# Patient Record
Sex: Female | Born: 1975 | Race: White | Hispanic: No | Marital: Single | Smoking: Never smoker
Health system: Southern US, Community
[De-identification: ages and names within clinical notes are randomized; demographics above are authoritative.]

## PROBLEM LIST (undated history)

## (undated) DIAGNOSIS — S72009A Fracture of unspecified part of neck of unspecified femur, initial encounter for closed fracture: Secondary | ICD-10-CM

## (undated) DIAGNOSIS — Z9289 Personal history of other medical treatment: Secondary | ICD-10-CM

## (undated) DIAGNOSIS — IMO0002 Reserved for concepts with insufficient information to code with codable children: Secondary | ICD-10-CM

## (undated) DIAGNOSIS — K219 Gastro-esophageal reflux disease without esophagitis: Secondary | ICD-10-CM

## (undated) DIAGNOSIS — D649 Anemia, unspecified: Secondary | ICD-10-CM

## (undated) DIAGNOSIS — R4586 Emotional lability: Secondary | ICD-10-CM

## (undated) DIAGNOSIS — E785 Hyperlipidemia, unspecified: Secondary | ICD-10-CM

## (undated) DIAGNOSIS — I10 Essential (primary) hypertension: Secondary | ICD-10-CM

## (undated) DIAGNOSIS — E039 Hypothyroidism, unspecified: Secondary | ICD-10-CM

## (undated) DIAGNOSIS — M8430XA Stress fracture, unspecified site, initial encounter for fracture: Secondary | ICD-10-CM

## (undated) DIAGNOSIS — G43009 Migraine without aura, not intractable, without status migrainosus: Secondary | ICD-10-CM

## (undated) DIAGNOSIS — M8580 Other specified disorders of bone density and structure, unspecified site: Secondary | ICD-10-CM

## (undated) DIAGNOSIS — K900 Celiac disease: Secondary | ICD-10-CM

## (undated) DIAGNOSIS — J45909 Unspecified asthma, uncomplicated: Secondary | ICD-10-CM

## (undated) DIAGNOSIS — Z973 Presence of spectacles and contact lenses: Secondary | ICD-10-CM

## (undated) DIAGNOSIS — T7840XA Allergy, unspecified, initial encounter: Secondary | ICD-10-CM

## (undated) HISTORY — DX: Emotional lability: R45.86

## (undated) HISTORY — DX: Personal history of other medical treatment: Z92.89

## (undated) HISTORY — PX: COSMETIC SURGERY: SHX468

## (undated) HISTORY — PX: POLYPECTOMY: SHX149

## (undated) HISTORY — DX: Other specified disorders of bone density and structure, unspecified site: M85.80

## (undated) HISTORY — PX: INGUINAL HERNIA REPAIR: SUR1180

## (undated) HISTORY — DX: Allergy, unspecified, initial encounter: T78.40XA

## (undated) HISTORY — DX: Presence of spectacles and contact lenses: Z97.3

## (undated) HISTORY — DX: Hyperlipidemia, unspecified: E78.5

## (undated) HISTORY — PX: SINOSCOPY: SHX187

## (undated) HISTORY — PX: ESOPHAGOGASTRODUODENOSCOPY: SHX1529

## (undated) HISTORY — DX: Reserved for concepts with insufficient information to code with codable children: IMO0002

## (undated) HISTORY — DX: Migraine without aura, not intractable, without status migrainosus: G43.009

## (undated) HISTORY — DX: Stress fracture, unspecified site, initial encounter for fracture: M84.30XA

## (undated) HISTORY — DX: Essential (primary) hypertension: I10

## (undated) HISTORY — DX: Unspecified asthma, uncomplicated: J45.909

## (undated) HISTORY — DX: Anemia, unspecified: D64.9

## (undated) HISTORY — PX: WISDOM TOOTH EXTRACTION: SHX21

## (undated) HISTORY — DX: Gastro-esophageal reflux disease without esophagitis: K21.9

## (undated) HISTORY — PX: NASAL SEPTUM SURGERY: SHX37

## (undated) HISTORY — DX: Celiac disease: K90.0

## (undated) HISTORY — DX: Hypothyroidism, unspecified: E03.9

## (undated) HISTORY — DX: Fracture of unspecified part of neck of unspecified femur, initial encounter for closed fracture: S72.009A

---

## 2003-11-25 ENCOUNTER — Encounter: Admission: RE | Admit: 2003-11-25 | Discharge: 2003-11-25 | Payer: Self-pay | Admitting: Endocrinology

## 2004-04-09 ENCOUNTER — Other Ambulatory Visit: Admission: RE | Admit: 2004-04-09 | Discharge: 2004-04-09 | Payer: Self-pay | Admitting: Obstetrics and Gynecology

## 2004-04-23 ENCOUNTER — Encounter: Admission: RE | Admit: 2004-04-23 | Discharge: 2004-04-23 | Payer: Self-pay | Admitting: Sports Medicine

## 2004-05-04 ENCOUNTER — Encounter: Admission: RE | Admit: 2004-05-04 | Discharge: 2004-05-04 | Payer: Self-pay | Admitting: Sports Medicine

## 2004-05-11 ENCOUNTER — Encounter: Admission: RE | Admit: 2004-05-11 | Discharge: 2004-05-11 | Payer: Self-pay | Admitting: Family Medicine

## 2004-06-01 ENCOUNTER — Encounter: Admission: RE | Admit: 2004-06-01 | Discharge: 2004-06-01 | Payer: Self-pay | Admitting: Family Medicine

## 2004-09-02 ENCOUNTER — Ambulatory Visit: Payer: Self-pay | Admitting: Hematology & Oncology

## 2004-09-12 ENCOUNTER — Emergency Department (HOSPITAL_COMMUNITY): Admission: EM | Admit: 2004-09-12 | Discharge: 2004-09-12 | Payer: Self-pay | Admitting: Family Medicine

## 2004-09-30 ENCOUNTER — Ambulatory Visit: Payer: Self-pay | Admitting: Sports Medicine

## 2004-11-02 ENCOUNTER — Ambulatory Visit: Payer: Self-pay | Admitting: Hematology & Oncology

## 2004-11-02 ENCOUNTER — Ambulatory Visit: Payer: Self-pay | Admitting: Sports Medicine

## 2005-01-04 ENCOUNTER — Ambulatory Visit: Payer: Self-pay | Admitting: Sports Medicine

## 2005-01-05 ENCOUNTER — Ambulatory Visit: Payer: Self-pay | Admitting: Hematology & Oncology

## 2005-03-04 ENCOUNTER — Ambulatory Visit: Payer: Self-pay | Admitting: Hematology & Oncology

## 2005-03-24 ENCOUNTER — Other Ambulatory Visit: Admission: RE | Admit: 2005-03-24 | Discharge: 2005-03-24 | Payer: Self-pay | Admitting: Obstetrics and Gynecology

## 2005-04-13 ENCOUNTER — Other Ambulatory Visit: Admission: RE | Admit: 2005-04-13 | Discharge: 2005-04-13 | Payer: Self-pay | Admitting: Obstetrics and Gynecology

## 2005-04-19 ENCOUNTER — Ambulatory Visit: Payer: Self-pay | Admitting: Hematology & Oncology

## 2005-06-07 ENCOUNTER — Ambulatory Visit: Payer: Self-pay | Admitting: Hematology & Oncology

## 2005-06-24 ENCOUNTER — Encounter: Admission: RE | Admit: 2005-06-24 | Discharge: 2005-06-24 | Payer: Self-pay | Admitting: Sports Medicine

## 2005-06-24 ENCOUNTER — Ambulatory Visit: Payer: Self-pay | Admitting: Sports Medicine

## 2005-07-29 ENCOUNTER — Ambulatory Visit: Payer: Self-pay | Admitting: Hematology & Oncology

## 2005-08-19 ENCOUNTER — Ambulatory Visit: Payer: Self-pay | Admitting: Sports Medicine

## 2005-09-30 ENCOUNTER — Ambulatory Visit: Payer: Self-pay | Admitting: Hematology & Oncology

## 2005-11-04 ENCOUNTER — Ambulatory Visit: Payer: Self-pay | Admitting: Sports Medicine

## 2005-11-12 ENCOUNTER — Emergency Department (HOSPITAL_COMMUNITY): Admission: EM | Admit: 2005-11-12 | Discharge: 2005-11-12 | Payer: Self-pay | Admitting: Family Medicine

## 2005-11-13 ENCOUNTER — Emergency Department (HOSPITAL_COMMUNITY): Admission: EM | Admit: 2005-11-13 | Discharge: 2005-11-13 | Payer: Self-pay | Admitting: Family Medicine

## 2005-11-14 ENCOUNTER — Ambulatory Visit: Payer: Self-pay | Admitting: Sports Medicine

## 2005-11-17 ENCOUNTER — Ambulatory Visit: Payer: Self-pay | Admitting: Sports Medicine

## 2005-11-18 ENCOUNTER — Ambulatory Visit: Payer: Self-pay | Admitting: Hematology & Oncology

## 2005-11-18 ENCOUNTER — Ambulatory Visit: Payer: Self-pay | Admitting: Sports Medicine

## 2005-11-19 ENCOUNTER — Ambulatory Visit (HOSPITAL_COMMUNITY): Admission: RE | Admit: 2005-11-19 | Discharge: 2005-11-19 | Payer: Self-pay | Admitting: Sports Medicine

## 2005-11-22 ENCOUNTER — Ambulatory Visit: Payer: Self-pay | Admitting: Sports Medicine

## 2005-12-13 ENCOUNTER — Ambulatory Visit: Payer: Self-pay | Admitting: Sports Medicine

## 2006-01-03 ENCOUNTER — Ambulatory Visit: Payer: Self-pay | Admitting: Hematology & Oncology

## 2006-02-06 LAB — CBC WITH DIFFERENTIAL/PLATELET
EOS%: 7.8 % — ABNORMAL HIGH (ref 0.0–7.0)
Eosinophils Absolute: 0.4 10*3/uL (ref 0.0–0.5)
MCV: 94.5 fL (ref 81.0–101.0)
MONO%: 6.6 % (ref 0.0–13.0)
NEUT#: 3.6 10*3/uL (ref 1.5–6.5)
RBC: 4.75 10*6/uL (ref 3.70–5.32)
RDW: 12.9 % (ref 11.3–14.5)

## 2006-02-07 LAB — VITAMIN B12: Vitamin B-12: 504 pg/mL (ref 211–911)

## 2006-02-07 LAB — FERRITIN: Ferritin: 459 ng/mL — ABNORMAL HIGH (ref 10–291)

## 2006-03-02 ENCOUNTER — Ambulatory Visit: Payer: Self-pay | Admitting: Hematology & Oncology

## 2006-03-06 LAB — CBC WITH DIFFERENTIAL/PLATELET
Basophils Absolute: 0 10*3/uL (ref 0.0–0.1)
Eosinophils Absolute: 0.9 10*3/uL — ABNORMAL HIGH (ref 0.0–0.5)
HGB: 15.1 g/dL (ref 11.6–15.9)
LYMPH%: 23.8 % (ref 14.0–48.0)
MCV: 94.7 fL (ref 81.0–101.0)
MONO%: 7.1 % (ref 0.0–13.0)
NEUT#: 3.1 10*3/uL (ref 1.5–6.5)
Platelets: 248 10*3/uL (ref 145–400)
RBC: 4.63 10*6/uL (ref 3.70–5.32)

## 2006-04-04 LAB — CBC WITH DIFFERENTIAL/PLATELET
Basophils Absolute: 0 10*3/uL (ref 0.0–0.1)
EOS%: 14.7 % — ABNORMAL HIGH (ref 0.0–7.0)
HCT: 40.5 % (ref 34.8–46.6)
HGB: 13.7 g/dL (ref 11.6–15.9)
LYMPH%: 29.4 % (ref 14.0–48.0)
MCH: 32.2 pg (ref 26.0–34.0)
MCV: 95.2 fL (ref 81.0–101.0)
MONO%: 6 % (ref 0.0–13.0)
NEUT%: 49.2 % (ref 39.6–76.8)
Platelets: 274 10*3/uL (ref 145–400)

## 2006-04-05 LAB — IRON AND TIBC: UIBC: 129 ug/dL

## 2006-04-28 ENCOUNTER — Ambulatory Visit: Payer: Self-pay | Admitting: Hematology & Oncology

## 2006-05-10 LAB — CBC WITH DIFFERENTIAL/PLATELET
BASO%: 0.6 % (ref 0.0–2.0)
Basophils Absolute: 0 10*3/uL (ref 0.0–0.1)
EOS%: 6.2 % (ref 0.0–7.0)
HCT: 45.3 % (ref 34.8–46.6)
HGB: 15.4 g/dL (ref 11.6–15.9)
MONO#: 0.2 10*3/uL (ref 0.1–0.9)
MONO%: 4.5 % (ref 0.0–13.0)
Platelets: 272 10*3/uL (ref 145–400)
WBC: 4.4 10*3/uL (ref 3.9–10.0)
lymph#: 1.4 10*3/uL (ref 0.9–3.3)

## 2006-05-11 LAB — IRON AND TIBC: Iron: 105 ug/dL (ref 42–145)

## 2006-05-11 LAB — FERRITIN: Ferritin: 227 ng/mL (ref 10–291)

## 2006-05-11 LAB — FOLATE RBC: RBC Folate: 556 ng/mL (ref 180–600)

## 2006-05-22 ENCOUNTER — Other Ambulatory Visit: Admission: RE | Admit: 2006-05-22 | Discharge: 2006-05-22 | Payer: Self-pay | Admitting: Obstetrics and Gynecology

## 2006-05-24 ENCOUNTER — Ambulatory Visit (HOSPITAL_COMMUNITY): Admission: RE | Admit: 2006-05-24 | Discharge: 2006-05-24 | Payer: Self-pay | Admitting: Hematology & Oncology

## 2006-06-12 LAB — CBC WITH DIFFERENTIAL/PLATELET
Basophils Absolute: 0 10*3/uL (ref 0.0–0.1)
HCT: 43.5 % (ref 34.8–46.6)
HGB: 14.9 g/dL (ref 11.6–15.9)
MONO#: 0.3 10*3/uL (ref 0.1–0.9)
NEUT#: 3 10*3/uL (ref 1.5–6.5)
NEUT%: 64.2 % (ref 39.6–76.8)
WBC: 4.7 10*3/uL (ref 3.9–10.0)
lymph#: 1 10*3/uL (ref 0.9–3.3)

## 2006-06-13 LAB — IRON AND TIBC
%SAT: 15 % — ABNORMAL LOW (ref 20–55)
TIBC: 369 ug/dL (ref 250–470)

## 2006-06-13 LAB — VITAMIN B12: Vitamin B-12: 457 pg/mL (ref 211–911)

## 2006-06-13 LAB — FERRITIN: Ferritin: 179 ng/mL (ref 10–291)

## 2006-06-13 LAB — FOLATE RBC: RBC Folate: 618 ng/mL — ABNORMAL HIGH (ref 180–600)

## 2006-06-15 ENCOUNTER — Ambulatory Visit: Payer: Self-pay | Admitting: Hematology & Oncology

## 2006-07-11 LAB — CBC WITH DIFFERENTIAL/PLATELET
BASO%: 0.3 % (ref 0.0–2.0)
EOS%: 5.9 % (ref 0.0–7.0)
HCT: 45.2 % (ref 34.8–46.6)
MCH: 31.8 pg (ref 26.0–34.0)
MCHC: 34.4 g/dL (ref 32.0–36.0)
NEUT%: 67.3 % (ref 39.6–76.8)
RBC: 4.87 10*6/uL (ref 3.70–5.32)
WBC: 6 10*3/uL (ref 3.9–10.0)
lymph#: 1.3 10*3/uL (ref 0.9–3.3)

## 2006-07-12 LAB — FOLATE RBC: RBC Folate: 460 ng/mL (ref 180–600)

## 2006-07-12 LAB — FERRITIN: Ferritin: 136 ng/mL (ref 10–291)

## 2006-07-12 LAB — IRON AND TIBC: Iron: 95 ug/dL (ref 42–145)

## 2006-07-18 ENCOUNTER — Ambulatory Visit (HOSPITAL_COMMUNITY): Admission: RE | Admit: 2006-07-18 | Discharge: 2006-07-18 | Payer: Self-pay | Admitting: Hematology & Oncology

## 2006-08-04 ENCOUNTER — Ambulatory Visit: Payer: Self-pay | Admitting: Hematology & Oncology

## 2006-08-10 LAB — CBC WITH DIFFERENTIAL/PLATELET
BASO%: 0.5 % (ref 0.0–2.0)
EOS%: 7 % (ref 0.0–7.0)
HCT: 42.5 % (ref 34.8–46.6)
MCH: 32.2 pg (ref 26.0–34.0)
MCHC: 34.5 g/dL (ref 32.0–36.0)
MONO#: 0.2 10*3/uL (ref 0.1–0.9)
RBC: 4.56 10*6/uL (ref 3.70–5.32)
RDW: 12.5 % (ref 11.3–14.5)
WBC: 4.3 10*3/uL (ref 3.9–10.0)
lymph#: 0.8 10*3/uL — ABNORMAL LOW (ref 0.9–3.3)

## 2006-08-11 LAB — IRON AND TIBC
%SAT: 43 % (ref 20–55)
Iron: 143 ug/dL (ref 42–145)
TIBC: 335 ug/dL (ref 250–470)
UIBC: 192 ug/dL

## 2006-08-11 LAB — FOLATE RBC: RBC Folate: 489 ng/mL (ref 180–600)

## 2006-09-07 LAB — CBC WITH DIFFERENTIAL/PLATELET
BASO%: 0.5 % (ref 0.0–2.0)
Eosinophils Absolute: 0.4 10*3/uL (ref 0.0–0.5)
HCT: 44.2 % (ref 34.8–46.6)
HGB: 14.9 g/dL (ref 11.6–15.9)
LYMPH%: 26.4 % (ref 14.0–48.0)
MONO#: 0.4 10*3/uL (ref 0.1–0.9)
NEUT#: 3.3 10*3/uL (ref 1.5–6.5)
NEUT%: 59.1 % (ref 39.6–76.8)
Platelets: 296 10*3/uL (ref 145–400)
WBC: 5.6 10*3/uL (ref 3.9–10.0)
lymph#: 1.5 10*3/uL (ref 0.9–3.3)

## 2006-09-08 LAB — IRON AND TIBC
%SAT: 17 % — ABNORMAL LOW (ref 20–55)
TIBC: 330 ug/dL (ref 250–470)

## 2006-09-08 LAB — FOLATE RBC: RBC Folate: 618 ng/mL — ABNORMAL HIGH (ref 180–600)

## 2006-09-08 LAB — VITAMIN B12: Vitamin B-12: 549 pg/mL (ref 211–911)

## 2006-09-20 ENCOUNTER — Ambulatory Visit: Payer: Self-pay | Admitting: Hematology & Oncology

## 2006-10-10 LAB — CBC WITH DIFFERENTIAL/PLATELET
BASO%: 0.3 % (ref 0.0–2.0)
EOS%: 11.6 % — ABNORMAL HIGH (ref 0.0–7.0)
Eosinophils Absolute: 0.6 10*3/uL — ABNORMAL HIGH (ref 0.0–0.5)
LYMPH%: 29 % (ref 14.0–48.0)
MCHC: 33.8 g/dL (ref 32.0–36.0)
MCV: 93.4 fL (ref 81.0–101.0)
MONO%: 8.1 % (ref 0.0–13.0)
NEUT#: 2.7 10*3/uL (ref 1.5–6.5)
Platelets: 255 10*3/uL (ref 145–400)
RBC: 4.9 10*6/uL (ref 3.70–5.32)
RDW: 12 % (ref 11.3–14.5)
WBC: 5.3 10*3/uL (ref 3.9–10.0)

## 2006-10-11 LAB — IRON AND TIBC: TIBC: 277 ug/dL (ref 250–470)

## 2006-10-11 LAB — FERRITIN: Ferritin: 541 ng/mL — ABNORMAL HIGH (ref 10–291)

## 2006-10-11 LAB — VITAMIN B12: Vitamin B-12: 704 pg/mL (ref 211–911)

## 2006-10-31 DIAGNOSIS — S72009A Fracture of unspecified part of neck of unspecified femur, initial encounter for closed fracture: Secondary | ICD-10-CM

## 2006-10-31 HISTORY — DX: Fracture of unspecified part of neck of unspecified femur, initial encounter for closed fracture: S72.009A

## 2006-11-09 ENCOUNTER — Ambulatory Visit: Payer: Self-pay | Admitting: Hematology & Oncology

## 2006-11-14 LAB — CBC & DIFF AND RETIC
BASO%: 0.3 % (ref 0.0–2.0)
LYMPH%: 25.4 % (ref 14.0–48.0)
MCH: 30.6 pg (ref 26.0–34.0)
MCHC: 32.5 g/dL (ref 32.0–36.0)
MCV: 93.9 fL (ref 81.0–101.0)
MONO%: 6.5 % (ref 0.0–13.0)
Platelets: 273 10*3/uL (ref 145–400)
RBC: 4.9 10*6/uL (ref 3.70–5.32)
Retic %: 1 % (ref 0.4–2.3)
WBC: 4.5 10*3/uL (ref 3.9–10.0)

## 2006-11-15 LAB — VITAMIN B12: Vitamin B-12: 509 pg/mL (ref 211–911)

## 2006-11-15 LAB — FERRITIN: Ferritin: 296 ng/mL — ABNORMAL HIGH (ref 10–291)

## 2006-12-18 ENCOUNTER — Ambulatory Visit: Payer: Self-pay | Admitting: Hematology & Oncology

## 2006-12-21 LAB — CBC WITH DIFFERENTIAL/PLATELET
BASO%: 0.3 % (ref 0.0–2.0)
EOS%: 4.7 % (ref 0.0–7.0)
LYMPH%: 16.9 % (ref 14.0–48.0)
MCHC: 34.3 g/dL (ref 32.0–36.0)
MCV: 92.8 fL (ref 81.0–101.0)
MONO%: 4.7 % (ref 0.0–13.0)
Platelets: 229 10*3/uL (ref 145–400)
RBC: 4.82 10*6/uL (ref 3.70–5.32)
RDW: 13.5 % (ref 11.3–14.5)

## 2006-12-21 LAB — CHCC SMEAR

## 2006-12-22 LAB — FERRITIN: Ferritin: 206 ng/mL (ref 10–291)

## 2006-12-22 LAB — VITAMIN B12: Vitamin B-12: 440 pg/mL (ref 211–911)

## 2007-01-18 LAB — CHCC SMEAR

## 2007-01-18 LAB — CBC WITH DIFFERENTIAL/PLATELET
BASO%: 1 % (ref 0.0–2.0)
MCHC: 35.4 g/dL (ref 32.0–36.0)
MONO#: 0.3 10*3/uL (ref 0.1–0.9)
RBC: 4.55 10*6/uL (ref 3.70–5.32)
WBC: 5.9 10*3/uL (ref 3.9–10.0)
lymph#: 0.9 10*3/uL (ref 0.9–3.3)

## 2007-01-22 LAB — VITAMIN B12: Vitamin B-12: 469 pg/mL (ref 211–911)

## 2007-02-04 ENCOUNTER — Emergency Department (HOSPITAL_COMMUNITY): Admission: EM | Admit: 2007-02-04 | Discharge: 2007-02-04 | Payer: Self-pay | Admitting: Emergency Medicine

## 2007-02-05 ENCOUNTER — Telehealth (INDEPENDENT_AMBULATORY_CARE_PROVIDER_SITE_OTHER): Payer: Self-pay | Admitting: *Deleted

## 2007-02-09 ENCOUNTER — Ambulatory Visit: Payer: Self-pay | Admitting: Sports Medicine

## 2007-02-09 DIAGNOSIS — R109 Unspecified abdominal pain: Secondary | ICD-10-CM | POA: Insufficient documentation

## 2007-02-09 DIAGNOSIS — M25559 Pain in unspecified hip: Secondary | ICD-10-CM | POA: Insufficient documentation

## 2007-02-13 ENCOUNTER — Encounter: Admission: RE | Admit: 2007-02-13 | Discharge: 2007-02-13 | Payer: Self-pay | Admitting: Sports Medicine

## 2007-02-14 ENCOUNTER — Telehealth: Payer: Self-pay | Admitting: Sports Medicine

## 2007-02-19 ENCOUNTER — Ambulatory Visit: Payer: Self-pay | Admitting: Hematology & Oncology

## 2007-02-22 LAB — CBC WITH DIFFERENTIAL/PLATELET
EOS%: 4.6 % (ref 0.0–7.0)
MCH: 32.3 pg (ref 26.0–34.0)
MCV: 90.8 fL (ref 81.0–101.0)
MONO%: 8.4 % (ref 0.0–13.0)
RBC: 4.49 10*6/uL (ref 3.70–5.32)
RDW: 12.2 % (ref 11.3–14.5)

## 2007-02-22 LAB — CHCC SMEAR

## 2007-02-23 LAB — FOLATE RBC: RBC Folate: 607 ng/mL — ABNORMAL HIGH (ref 180–600)

## 2007-02-23 LAB — FERRITIN: Ferritin: 144 ng/mL (ref 10–291)

## 2007-04-13 ENCOUNTER — Ambulatory Visit: Payer: Self-pay | Admitting: Hematology & Oncology

## 2007-04-18 LAB — CBC WITH DIFFERENTIAL/PLATELET
EOS%: 7.5 % — ABNORMAL HIGH (ref 0.0–7.0)
MCH: 31.7 pg (ref 26.0–34.0)
MCHC: 35.4 g/dL (ref 32.0–36.0)
MCV: 89.8 fL (ref 81.0–101.0)
MONO%: 5.3 % (ref 0.0–13.0)
RBC: 4.89 10*6/uL (ref 3.70–5.32)
RDW: 12.2 % (ref 11.3–14.5)

## 2007-04-18 LAB — CHCC SMEAR

## 2007-04-19 LAB — VITAMIN B12: Vitamin B-12: 526 pg/mL (ref 211–911)

## 2007-04-19 LAB — FERRITIN: Ferritin: 149 ng/mL (ref 10–291)

## 2007-05-08 ENCOUNTER — Ambulatory Visit: Payer: Self-pay | Admitting: Sports Medicine

## 2007-05-08 DIAGNOSIS — S72009A Fracture of unspecified part of neck of unspecified femur, initial encounter for closed fracture: Secondary | ICD-10-CM | POA: Insufficient documentation

## 2007-05-09 ENCOUNTER — Encounter: Payer: Self-pay | Admitting: Sports Medicine

## 2007-05-11 ENCOUNTER — Encounter: Admission: RE | Admit: 2007-05-11 | Discharge: 2007-05-11 | Payer: Self-pay | Admitting: Sports Medicine

## 2007-05-15 LAB — CBC WITH DIFFERENTIAL/PLATELET
BASO%: 0.3 % (ref 0.0–2.0)
LYMPH%: 25.9 % (ref 14.0–48.0)
MCHC: 35.4 g/dL (ref 32.0–36.0)
MCV: 90 fL (ref 81.0–101.0)
MONO%: 3.9 % (ref 0.0–13.0)
Platelets: 250 10*3/uL (ref 145–400)
RBC: 4.87 10*6/uL (ref 3.70–5.32)

## 2007-05-15 LAB — CHCC SMEAR

## 2007-05-16 LAB — VITAMIN B12: Vitamin B-12: 451 pg/mL (ref 211–911)

## 2007-06-12 ENCOUNTER — Encounter: Payer: Self-pay | Admitting: Sports Medicine

## 2007-06-13 ENCOUNTER — Ambulatory Visit: Payer: Self-pay | Admitting: Hematology & Oncology

## 2007-06-13 LAB — CBC WITH DIFFERENTIAL/PLATELET
EOS%: 3.8 % (ref 0.0–7.0)
LYMPH%: 29.7 % (ref 14.0–48.0)
MCH: 32.3 pg (ref 26.0–34.0)
MCV: 91.4 fL (ref 81.0–101.0)
MONO%: 5.6 % (ref 0.0–13.0)
Platelets: 201 10*3/uL (ref 145–400)
RBC: 4.78 10*6/uL (ref 3.70–5.32)
RDW: 13.2 % (ref 11.3–14.5)

## 2007-06-14 LAB — FOLATE RBC: RBC Folate: 702 ng/mL — ABNORMAL HIGH (ref 180–600)

## 2007-06-14 LAB — FERRITIN: Ferritin: 122 ng/mL (ref 10–291)

## 2007-07-09 LAB — CBC WITH DIFFERENTIAL/PLATELET
Basophils Absolute: 0 10*3/uL (ref 0.0–0.1)
EOS%: 3.6 % (ref 0.0–7.0)
Eosinophils Absolute: 0.2 10*3/uL (ref 0.0–0.5)
HCT: 42.3 % (ref 34.8–46.6)
HGB: 14.9 g/dL (ref 11.6–15.9)
MCH: 32.3 pg (ref 26.0–34.0)
MONO#: 0.3 10*3/uL (ref 0.1–0.9)
NEUT#: 3.2 10*3/uL (ref 1.5–6.5)
NEUT%: 66.7 % (ref 39.6–76.8)
RDW: 12.7 % (ref 11.3–14.5)
WBC: 4.8 10*3/uL (ref 3.9–10.0)
lymph#: 1.1 10*3/uL (ref 0.9–3.3)

## 2007-07-12 LAB — FOLATE RBC: RBC Folate: 735 ng/mL — ABNORMAL HIGH (ref 180–600)

## 2007-07-12 LAB — VITAMIN B12: Vitamin B-12: 509 pg/mL (ref 211–911)

## 2007-07-26 ENCOUNTER — Ambulatory Visit: Payer: Self-pay | Admitting: Hematology & Oncology

## 2007-08-16 LAB — CBC WITH DIFFERENTIAL/PLATELET
BASO%: 0.7 % (ref 0.0–2.0)
Basophils Absolute: 0 10*3/uL (ref 0.0–0.1)
EOS%: 5 % (ref 0.0–7.0)
HCT: 43.6 % (ref 34.8–46.6)
HGB: 15.3 g/dL (ref 11.6–15.9)
MCH: 32.3 pg (ref 26.0–34.0)
MCHC: 35.1 g/dL (ref 32.0–36.0)
MONO#: 0.4 10*3/uL (ref 0.1–0.9)
RDW: 12.1 % (ref 11.3–14.5)
WBC: 4.8 10*3/uL (ref 3.9–10.0)
lymph#: 1.3 10*3/uL (ref 0.9–3.3)

## 2007-08-17 LAB — FERRITIN: Ferritin: 554 ng/mL — ABNORMAL HIGH (ref 10–291)

## 2007-08-20 ENCOUNTER — Encounter: Payer: Self-pay | Admitting: Sports Medicine

## 2007-09-14 ENCOUNTER — Ambulatory Visit: Payer: Self-pay | Admitting: Hematology & Oncology

## 2007-09-19 LAB — CBC WITH DIFFERENTIAL/PLATELET
Basophils Absolute: 0 10*3/uL (ref 0.0–0.1)
Eosinophils Absolute: 0.1 10*3/uL (ref 0.0–0.5)
HGB: 15.7 g/dL (ref 11.6–15.9)
LYMPH%: 27.5 % (ref 14.0–48.0)
MCV: 92.9 fL (ref 81.0–101.0)
MONO#: 0.2 10*3/uL (ref 0.1–0.9)
MONO%: 6 % (ref 0.0–13.0)
NEUT#: 2.5 10*3/uL (ref 1.5–6.5)
Platelets: 229 10*3/uL (ref 145–400)
RBC: 4.72 10*6/uL (ref 3.70–5.32)
WBC: 4 10*3/uL (ref 3.9–10.0)

## 2007-09-20 LAB — FERRITIN: Ferritin: 323 ng/mL — ABNORMAL HIGH (ref 10–291)

## 2007-09-20 LAB — FOLATE RBC: RBC Folate: 623 ng/mL — ABNORMAL HIGH (ref 180–600)

## 2007-10-05 ENCOUNTER — Ambulatory Visit: Payer: Self-pay | Admitting: Sports Medicine

## 2007-10-05 DIAGNOSIS — IMO0002 Reserved for concepts with insufficient information to code with codable children: Secondary | ICD-10-CM | POA: Insufficient documentation

## 2007-10-19 LAB — CBC WITH DIFFERENTIAL/PLATELET
Basophils Absolute: 0 10*3/uL (ref 0.0–0.1)
Eosinophils Absolute: 0.1 10*3/uL (ref 0.0–0.5)
HCT: 44.3 % (ref 34.8–46.6)
HGB: 15.5 g/dL (ref 11.6–15.9)
LYMPH%: 21.8 % (ref 14.0–48.0)
MCV: 92.5 fL (ref 81.0–101.0)
MONO#: 0.2 10*3/uL (ref 0.1–0.9)
NEUT#: 2.9 10*3/uL (ref 1.5–6.5)
NEUT%: 70 % (ref 39.6–76.8)
Platelets: 214 10*3/uL (ref 145–400)
WBC: 4.2 10*3/uL (ref 3.9–10.0)

## 2007-10-22 LAB — FERRITIN: Ferritin: 291 ng/mL (ref 10–291)

## 2007-10-29 ENCOUNTER — Ambulatory Visit: Payer: Self-pay | Admitting: Hematology & Oncology

## 2007-12-11 ENCOUNTER — Telehealth (INDEPENDENT_AMBULATORY_CARE_PROVIDER_SITE_OTHER): Payer: Self-pay | Admitting: *Deleted

## 2007-12-11 ENCOUNTER — Ambulatory Visit: Payer: Self-pay | Admitting: Sports Medicine

## 2007-12-11 DIAGNOSIS — M79609 Pain in unspecified limb: Secondary | ICD-10-CM | POA: Insufficient documentation

## 2007-12-12 ENCOUNTER — Telehealth (INDEPENDENT_AMBULATORY_CARE_PROVIDER_SITE_OTHER): Payer: Self-pay | Admitting: *Deleted

## 2007-12-12 ENCOUNTER — Encounter (INDEPENDENT_AMBULATORY_CARE_PROVIDER_SITE_OTHER): Payer: Self-pay | Admitting: *Deleted

## 2007-12-12 ENCOUNTER — Encounter: Admission: RE | Admit: 2007-12-12 | Discharge: 2007-12-12 | Payer: Self-pay | Admitting: Sports Medicine

## 2007-12-13 ENCOUNTER — Encounter (INDEPENDENT_AMBULATORY_CARE_PROVIDER_SITE_OTHER): Payer: Self-pay | Admitting: *Deleted

## 2007-12-17 ENCOUNTER — Telehealth: Payer: Self-pay | Admitting: Sports Medicine

## 2007-12-17 ENCOUNTER — Encounter: Admission: RE | Admit: 2007-12-17 | Discharge: 2007-12-17 | Payer: Self-pay | Admitting: Sports Medicine

## 2007-12-24 ENCOUNTER — Encounter: Payer: Self-pay | Admitting: Sports Medicine

## 2007-12-24 ENCOUNTER — Ambulatory Visit: Payer: Self-pay | Admitting: Hematology & Oncology

## 2007-12-26 LAB — CBC WITH DIFFERENTIAL/PLATELET
BASO%: 1.3 % (ref 0.0–2.0)
Eosinophils Absolute: 0.1 10*3/uL (ref 0.0–0.5)
LYMPH%: 27.2 % (ref 14.0–48.0)
MCHC: 34.5 g/dL (ref 32.0–36.0)
MONO#: 0.3 10*3/uL (ref 0.1–0.9)
MONO%: 6.5 % (ref 0.0–13.0)
NEUT#: 2.5 10*3/uL (ref 1.5–6.5)
RBC: 4.8 10*6/uL (ref 3.70–5.32)
RDW: 12.4 % (ref 11.3–14.5)
WBC: 4.1 10*3/uL (ref 3.9–10.0)

## 2007-12-28 LAB — FOLATE RBC: RBC Folate: 437 ng/mL (ref 180–600)

## 2007-12-28 LAB — FERRITIN: Ferritin: 184 ng/mL (ref 10–291)

## 2008-02-11 ENCOUNTER — Ambulatory Visit: Payer: Self-pay | Admitting: Hematology & Oncology

## 2008-02-11 LAB — CBC & DIFF AND RETIC
BASO%: 0.9 % (ref 0.0–2.0)
Basophils Absolute: 0 10*3/uL (ref 0.0–0.1)
EOS%: 3.9 % (ref 0.0–7.0)
Eosinophils Absolute: 0.2 10*3/uL (ref 0.0–0.5)
HCT: 43 % (ref 34.8–46.6)
HGB: 15.2 g/dL (ref 11.6–15.9)
IRF: 0.3 (ref 0.130–0.330)
LYMPH%: 27.3 % (ref 14.0–48.0)
MCH: 32.8 pg (ref 26.0–34.0)
MCHC: 35.4 g/dL (ref 32.0–36.0)
MCV: 92.4 fL (ref 81.0–101.0)
MONO#: 0.3 10*3/uL (ref 0.1–0.9)
MONO%: 6.4 % (ref 0.0–13.0)
NEUT#: 2.7 10*3/uL (ref 1.5–6.5)
NEUT%: 61.5 % (ref 39.6–76.8)
Platelets: 192 10*3/uL (ref 145–400)
RBC: 4.65 10*6/uL (ref 3.70–5.32)
RDW: 12.2 % (ref 11.3–14.5)
RETIC #: 36.7 10*3/uL (ref 19.7–115.1)
Retic %: 0.8 % (ref 0.4–2.3)
WBC: 4.3 10*3/uL (ref 3.9–10.0)
lymph#: 1.2 10*3/uL (ref 0.9–3.3)

## 2008-02-12 LAB — FOLATE RBC: RBC Folate: 586 ng/mL (ref 180–600)

## 2008-02-12 LAB — FERRITIN: Ferritin: 99 ng/mL (ref 10–291)

## 2008-02-12 LAB — VITAMIN B12: Vitamin B-12: 355 pg/mL (ref 211–911)

## 2008-06-14 IMAGING — RF DG UGI W/ SMALL BOWEL
18 of 24 series · 18 of 24 positions shown · non-contrast
Comparison: none

CLINICAL DATA: Acute onset of nausea and vomiting.  History of celiac disease.
UPPER GI WITH SMALL BOWEL ? 07/18/06:

[Series 1: run · 1 of 1 slices shown (1 of 17)]
[im 1/1]
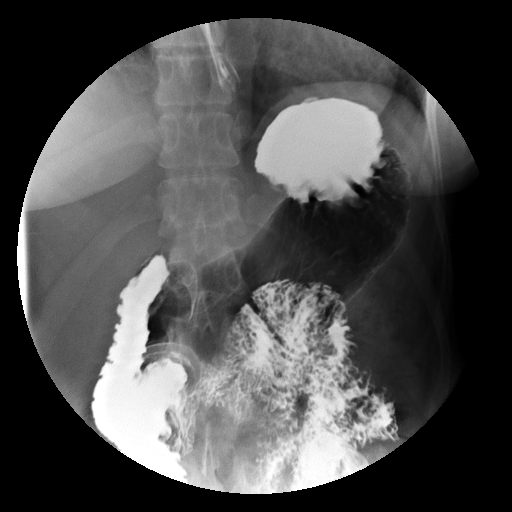

[Series 3: run · 1 of 1 slices shown (2 of 17)]
[im 1/1]
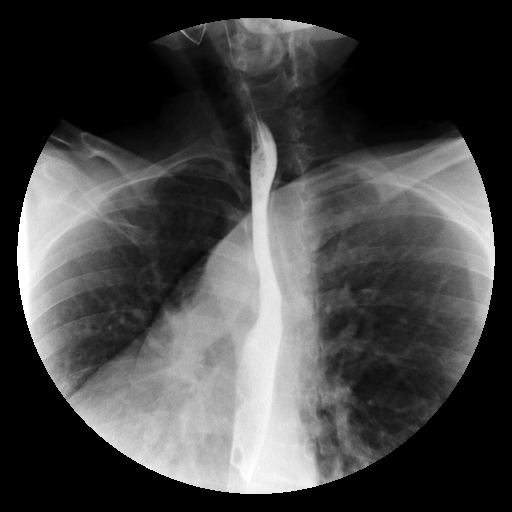

[Series 4: run · 1 of 1 slices shown (3 of 17)]
[im 1/1]
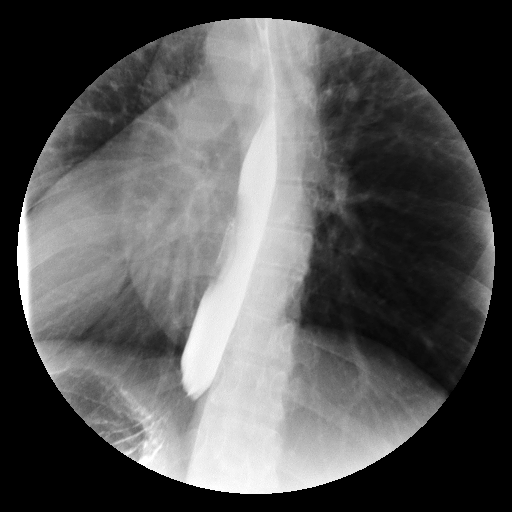

[Series 5: run · 1 of 1 slices shown (4 of 17)]
[im 1/1]
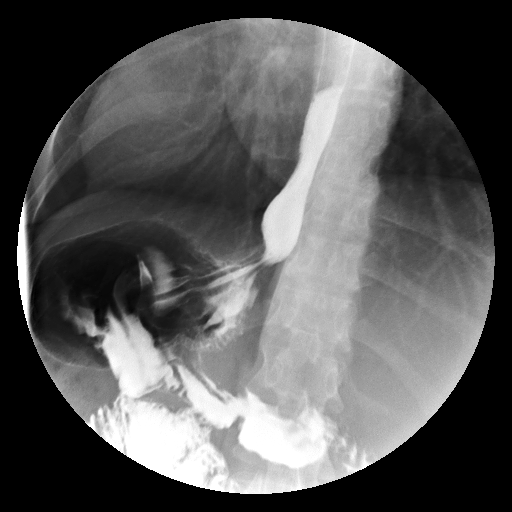

[Series 7: run · 1 of 1 slices shown (5 of 17)]
[im 1/1]
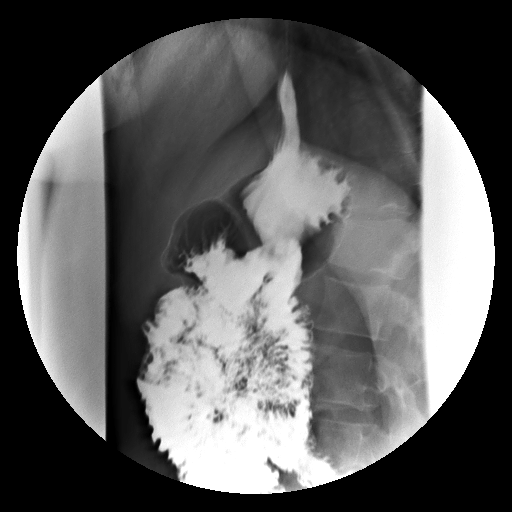

[Series 8: run · 1 of 1 slices shown (6 of 17)]
[im 1/1]
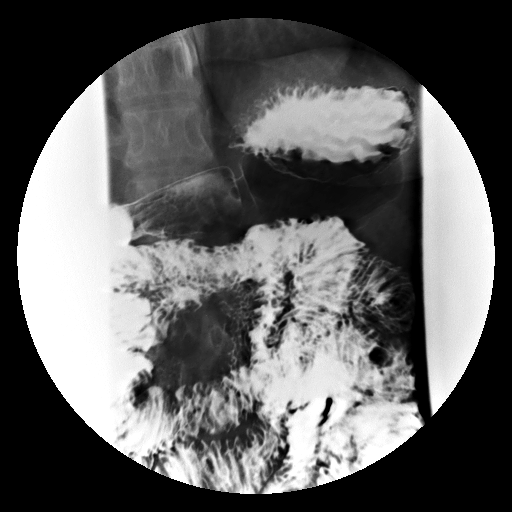

[Series 9: run · 1 of 1 slices shown (7 of 17)]
[im 1/1]
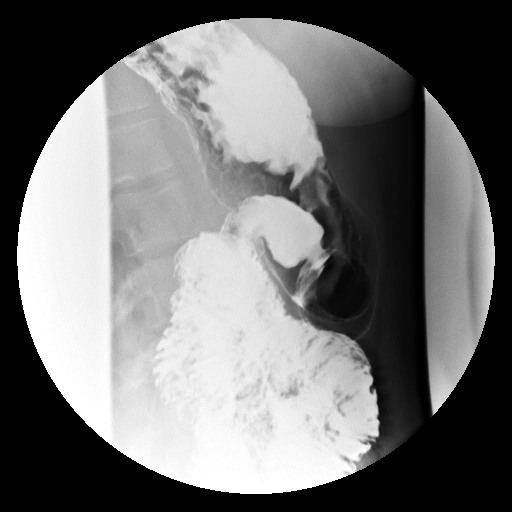

[Series 11: run · 1 of 1 slices shown (8 of 17)]
[im 1/1]
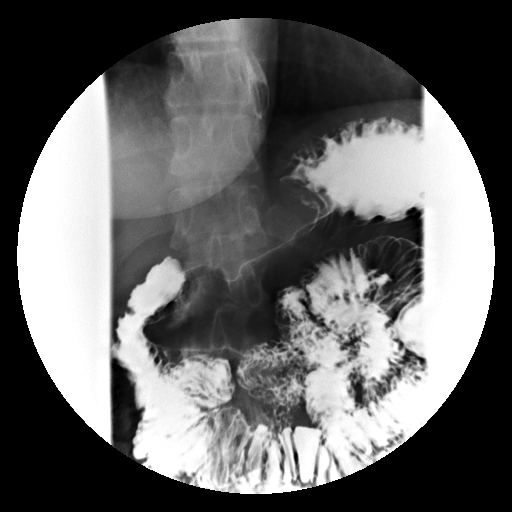

[Series 12: run · 1 of 1 slices shown (9 of 17)]
[im 1/1]
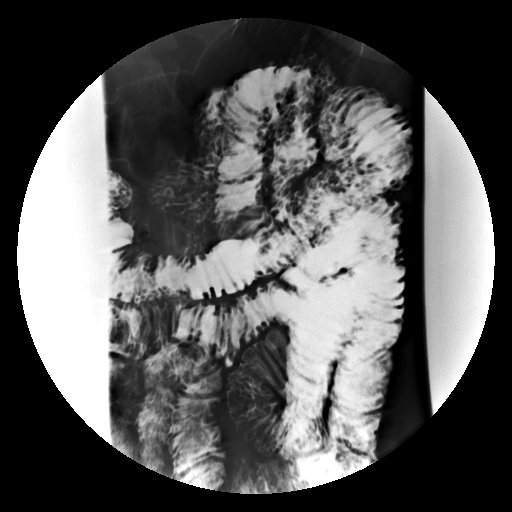

[Series 13: run · 1 of 1 slices shown (10 of 17)]
[im 1/1]
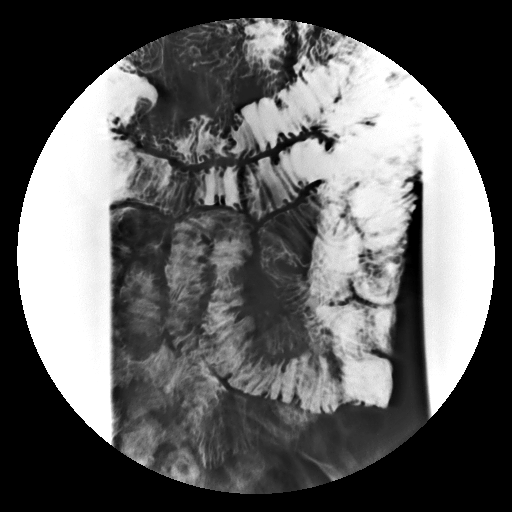

[Series 15: run · 1 of 1 slices shown (11 of 17)]
[im 1/1]
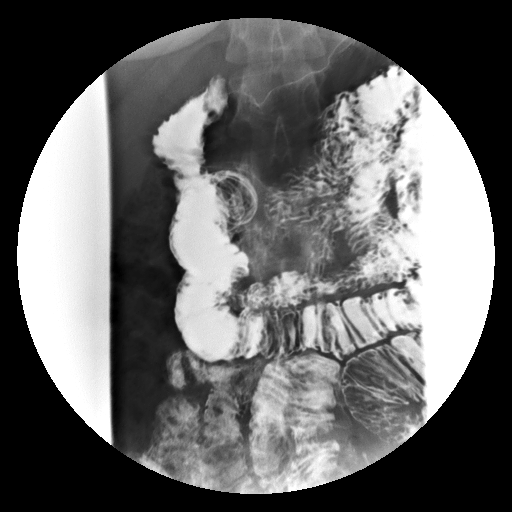

[Series 16: run · 1 of 1 slices shown (12 of 17)]
[im 1/1]
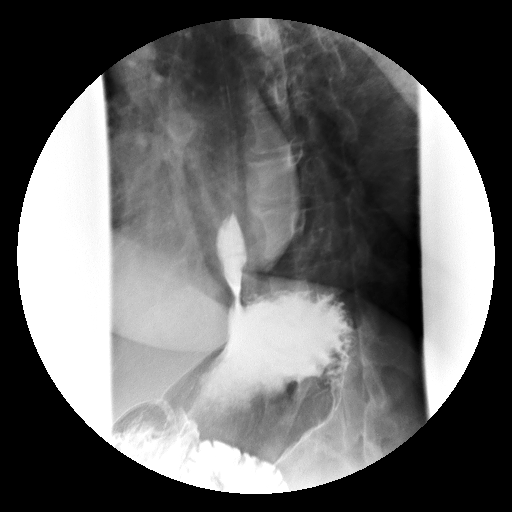

[Series 17: run · 1 of 1 slices shown (13 of 17)]
[im 1/1]
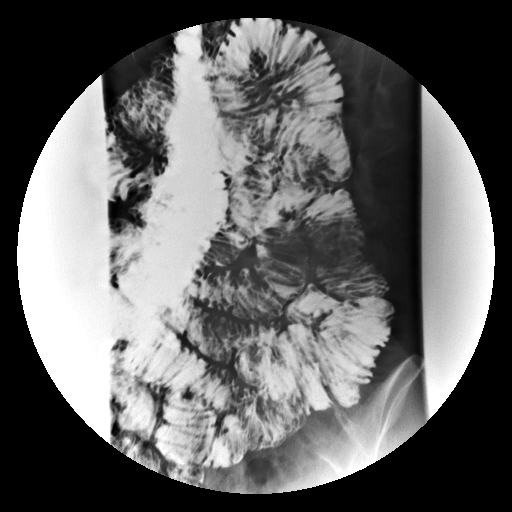

[Series 19: run · 1 of 1 slices shown (14 of 17)]
[im 1/1]
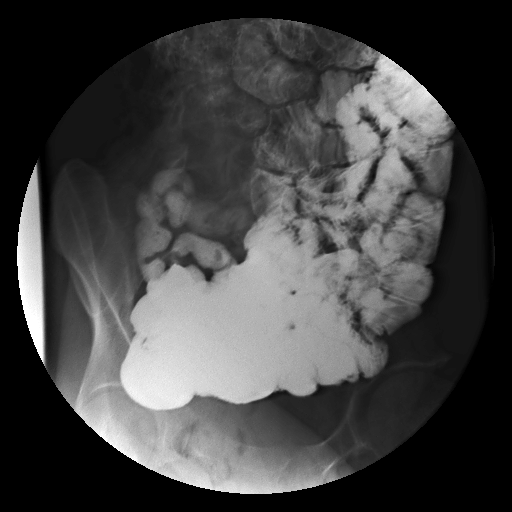

[Series 20: run · 1 of 1 slices shown (15 of 17)]
[im 1/1]
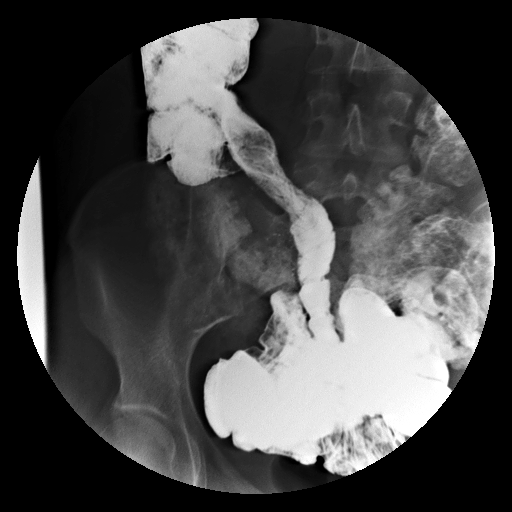

[Series 21: run · 1 of 1 slices shown (16 of 17)]
[im 1/1]
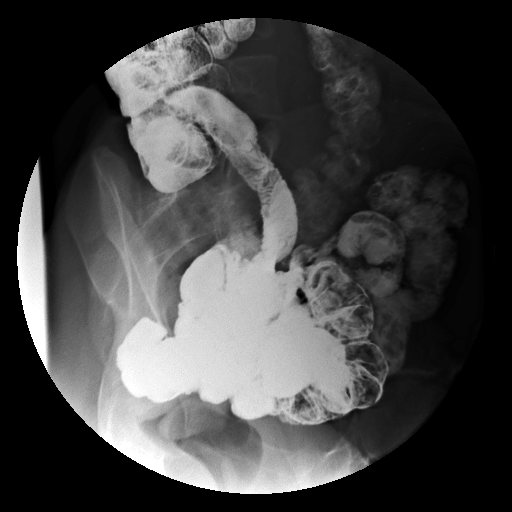

[Series 23: run · 1 of 1 slices shown (17 of 17)]
[im 1/1]
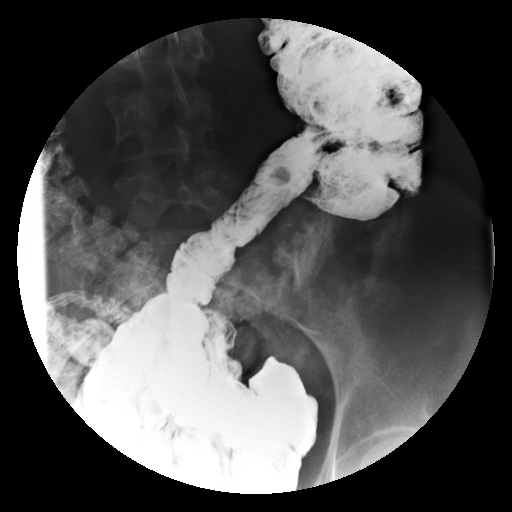

[Series 1001: view not recorded · 0.20mm/px · 1 of 1 slices shown]
[im 1/1]
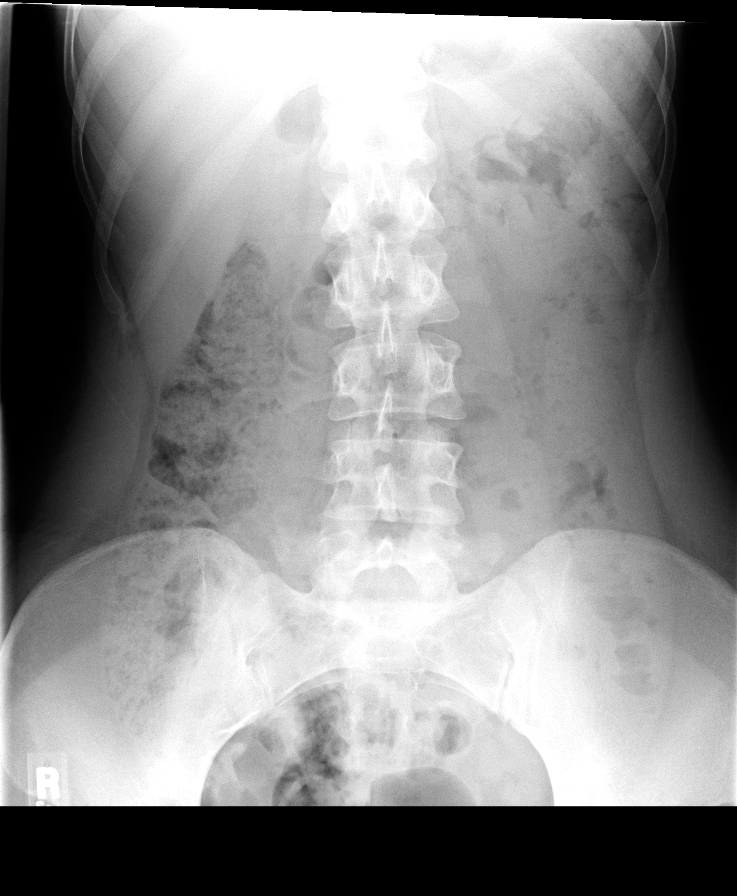

[18 of 24 positions shown; findings below may reference images not displayed]

FINDINGS: The preliminary scout film reveals a generous amount of stool in the colon.  
Esophageal motility and contour are normal.  There is a patulous appearing gastroesophageal junction compatible with a small hiatal hernia.  There is moderate gastroesophageal reflux.  No gastric, duodenal, or small bowel abnormality.  Normal small bowel mucosal fold pattern.  Negative for nodularity, mass, ulceration, or stricture.  Normal terminal ileum.
IMPRESSION: 1.  Small hiatal hernia and gastroesophageal reflux.
2.  No evidence for ulcer.  No mass.  
3.  Normal small bowel.

## 2008-09-01 LAB — HM COLONOSCOPY

## 2008-11-24 LAB — HM SIGMOIDOSCOPY

## 2010-11-21 ENCOUNTER — Encounter: Payer: Self-pay | Admitting: Hematology & Oncology

## 2011-07-25 ENCOUNTER — Encounter: Payer: Self-pay | Admitting: Sports Medicine

## 2011-07-25 ENCOUNTER — Ambulatory Visit (INDEPENDENT_AMBULATORY_CARE_PROVIDER_SITE_OTHER): Payer: BC Managed Care – PPO | Admitting: Sports Medicine

## 2011-07-25 VITALS — BP 119/82 | HR 63 | Ht 67.0 in | Wt 150.0 lb

## 2011-07-25 DIAGNOSIS — M79609 Pain in unspecified limb: Secondary | ICD-10-CM

## 2011-07-25 NOTE — Progress Notes (Signed)
Patient is here for followup on orthotics and in need of new orthotics. Patient had some made approximately 5 years ago has been doing very well. Patient has moved to [facility] at this time continuing to run somewhere between 6-10 miles usually 3 times a week. Patient states that she's had no more stress injuries like she did previous to the insoles. Patient denies any new pains and swelling of the feet or ankles or any new shoes recently. 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  ROS:  All negative as pertinent to chief complaint.  Physical exam: Vitals reviewed Foot inspection and palpation reveals breakdown of the transverse arch (but improved from previous exam) and a drop of MT heads: 1 and 5 Abnormal callous is not present Hammer toes:none PVV:ZSMO   Pt gait has mild swing of left hip compensating for long leg. The patient though does have some mild terminal rotation of the tibias bilaterally but improved per Dr. Oneida Alar from last exam  Patient was fitted for a : standard, cushioned, semi-rigid orthotic. The orthotic was heated and afterward the patient stood on the orthotic blank positioned on the orthotic stand. The patient was positioned in subtalar neutral position and 10 degrees of ankle dorsiflexion in a weight bearing stance. After completion of molding, a stable base was applied to the orthotic blank. The blank was ground to a stable position for weight bearing. Size:8 Base:blue Posting: Patient did have the addition of a first metatarsal ray as well as metatarsal pads bilaterally Additional orthotic padding: As above

## 2011-07-25 NOTE — Assessment & Plan Note (Signed)
Patient was fitted with orthotics today. Patient told to get a very thin half length heel pad to put in right shoe for leg length discrepancy. The patient may return for any type of manipulation needed to the orthotic. Patient given extra metatarsal pads as well as dancer bars in case more customization as needed. Patient will return as needed

## 2012-10-31 DIAGNOSIS — Z9289 Personal history of other medical treatment: Secondary | ICD-10-CM

## 2012-10-31 HISTORY — DX: Personal history of other medical treatment: Z92.89

## 2013-06-06 LAB — HM COLONOSCOPY

## 2013-10-31 HISTORY — PX: COLONOSCOPY: SHX174

## 2015-08-10 ENCOUNTER — Encounter: Payer: Self-pay | Admitting: Medical

## 2015-08-10 ENCOUNTER — Ambulatory Visit (INDEPENDENT_AMBULATORY_CARE_PROVIDER_SITE_OTHER): Payer: BLUE CROSS/BLUE SHIELD | Admitting: Medical

## 2015-08-10 VITALS — BP 102/64 | HR 104 | Temp 98.0°F | Ht 67.5 in | Wt 157.0 lb

## 2015-08-10 DIAGNOSIS — D509 Iron deficiency anemia, unspecified: Secondary | ICD-10-CM | POA: Diagnosis not present

## 2015-08-10 DIAGNOSIS — E039 Hypothyroidism, unspecified: Secondary | ICD-10-CM | POA: Diagnosis not present

## 2015-08-10 DIAGNOSIS — J4540 Moderate persistent asthma, uncomplicated: Secondary | ICD-10-CM

## 2015-08-10 LAB — IRON AND TIBC
%SAT: 47 % (ref 11–50)
Iron: 155 ug/dL (ref 40–190)
TIBC: 329 ug/dL (ref 250–450)
UIBC: 174 ug/dL (ref 125–400)

## 2015-08-10 LAB — CBC WITH DIFFERENTIAL/PLATELET
BASOS ABS: 0.1 10*3/uL (ref 0.0–0.1)
Basophils Relative: 1 % (ref 0–1)
EOS PCT: 8 % — AB (ref 0–5)
Eosinophils Absolute: 0.5 10*3/uL (ref 0.0–0.7)
HEMATOCRIT: 42.5 % (ref 36.0–46.0)
Hemoglobin: 14.9 g/dL (ref 12.0–15.0)
LYMPHS ABS: 2.3 10*3/uL (ref 0.7–4.0)
LYMPHS PCT: 37 % (ref 12–46)
MCH: 33.3 pg (ref 26.0–34.0)
MCHC: 35.1 g/dL (ref 30.0–36.0)
MCV: 94.9 fL (ref 78.0–100.0)
MPV: 10.4 fL (ref 8.6–12.4)
Monocytes Absolute: 0.5 10*3/uL (ref 0.1–1.0)
Monocytes Relative: 8 % (ref 3–12)
NEUTROS PCT: 46 % (ref 43–77)
Neutro Abs: 2.8 10*3/uL (ref 1.7–7.7)
Platelets: 244 10*3/uL (ref 150–400)
RBC: 4.48 MIL/uL (ref 3.87–5.11)
RDW: 12.3 % (ref 11.5–15.5)
WBC: 6.1 10*3/uL (ref 4.0–10.5)

## 2015-08-10 MED ORDER — MONTELUKAST SODIUM 10 MG PO TABS: 10.0000 mg | ORAL_TABLET | Freq: Every day | ORAL | Status: DC

## 2015-08-10 MED ORDER — BUDESONIDE-FORMOTEROL FUMARATE 160-4.5 MCG/ACT IN AERO: 2.0000 | INHALATION_SPRAY | Freq: Two times a day (BID) | RESPIRATORY_TRACT | Status: DC

## 2015-08-10 MED ORDER — ALBUTEROL SULFATE HFA 108 (90 BASE) MCG/ACT IN AERS: 1.0000 | INHALATION_SPRAY | Freq: Four times a day (QID) | RESPIRATORY_TRACT | Status: DC | PRN

## 2015-08-10 MED ORDER — ALBUTEROL SULFATE 108 (90 BASE) MCG/ACT IN AEPB: 2.0000 | INHALATION_SPRAY | Freq: Four times a day (QID) | RESPIRATORY_TRACT | Status: DC | PRN

## 2015-08-10 NOTE — Progress Notes (Signed)
   Subjective Chief Complaint  Patient presents with  . get established    Will bring shot record at next visit. Last pap was in april '16. mammogram april '16. used to be anemic. tetanus was done 05/2014. also been sick since 9/18 and went to urgent care 10/2 and did a breathing treatment and is finsihing up on a antibiotic so feeling somewhat better. asthma is not being controlled.    Here to establish care as a new patient.  Moved from [place] 04/2014.  Still has house there.  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    Has asthma, lately been wheezing a lot.   Started in mid September with flare up.  Since then has been 2 urgent care twice, been on 2 rounds of antibiotics, has had to have 1 round of prednisone, and nebulized treatments at urgent care.  Currently on Advair once daily, but has also used Breo in the past.   Is having to use albuterol a lot lately.  Using allegra for allergen triggers.  Has appt to see pulmonology in a few months, but wanted to come in and get on better regimen.   No calve pain, no hx/o DVT/PE.     Wants some blood work.  Has hx/o anemia, in the past saw Dr. Marin Olp every 6 mo for IV iron.    Periods not heavy.  Has celiac disease, hx/o 5 EGDs, 4 prior colonoscopies.   2009 had bleeding ulcer .  Nonsmoker.       She is up date on pneumococcal vaccine and many other vaccines that had to be updated when she started at [facility].  No other c/o at the moment.    ROS as in subjective   Objective: BP 102/64 mmHg  Pulse 104  Temp(Src) 98 F (36.7 C)  Ht 5' 7.5" (1.715 m)  Wt 157 lb (71.215 kg)  BMI 24.21 kg/m2  LMP 07/26/2015  Gen: wd, wn, nad HENT - unremarkable Lungs clear Heart RRR, normal s1, s2, no murmurs Ext: no edema Pulses normal    Assessment: Encounter Diagnoses  Name Primary?  Marland Kitchen Asthma, moderate persistent, uncomplicated Yes  . Anemia, iron deficiency   . Hypothyroidism, unspecified  hypothyroidism type     Plan: asthma - has used Advair, Breo prior.  Begin trial of Symbicort, albuterol prn, discussed proper use of medications.   Begin trial of Singulair, c/t allegra.   Call report in the next 2-3 wk  Anemia - labs today, hx/o IV iron q70mo hypothyroidism - been on current dose for years, stable, last labs few months ago

## 2015-08-10 NOTE — Addendum Note (Signed)
Addended by: Carlena Hurl on: 08/10/2015 05:13 PM   Modules accepted: Orders, Medications

## 2015-08-11 ENCOUNTER — Encounter: Payer: Self-pay | Admitting: Medical

## 2015-08-11 LAB — FERRITIN: Ferritin: 31 ng/mL (ref 10–291)

## 2015-08-18 ENCOUNTER — Institutional Professional Consult (permissible substitution): Payer: Self-pay | Admitting: Internal Medicine

## 2015-08-28 ENCOUNTER — Ambulatory Visit: Payer: Self-pay | Admitting: Medical

## 2015-10-02 ENCOUNTER — Encounter: Payer: BLUE CROSS/BLUE SHIELD | Admitting: Medical

## 2015-10-08 ENCOUNTER — Encounter: Payer: BLUE CROSS/BLUE SHIELD | Admitting: Medical

## 2015-10-08 ENCOUNTER — Ambulatory Visit (INDEPENDENT_AMBULATORY_CARE_PROVIDER_SITE_OTHER): Payer: BLUE CROSS/BLUE SHIELD | Admitting: Medical

## 2015-10-08 ENCOUNTER — Telehealth: Payer: Self-pay | Admitting: Medical

## 2015-10-08 ENCOUNTER — Encounter: Payer: Self-pay | Admitting: Medical

## 2015-10-08 VITALS — BP 130/100 | HR 72 | Ht 67.5 in | Wt 156.0 lb

## 2015-10-08 DIAGNOSIS — E039 Hypothyroidism, unspecified: Secondary | ICD-10-CM | POA: Diagnosis not present

## 2015-10-08 DIAGNOSIS — Z1159 Encounter for screening for other viral diseases: Secondary | ICD-10-CM | POA: Insufficient documentation

## 2015-10-08 DIAGNOSIS — D509 Iron deficiency anemia, unspecified: Secondary | ICD-10-CM | POA: Insufficient documentation

## 2015-10-08 DIAGNOSIS — E559 Vitamin D deficiency, unspecified: Secondary | ICD-10-CM | POA: Insufficient documentation

## 2015-10-08 DIAGNOSIS — Z0000 Encounter for general adult medical examination without abnormal findings: Secondary | ICD-10-CM

## 2015-10-08 DIAGNOSIS — F39 Unspecified mood [affective] disorder: Secondary | ICD-10-CM | POA: Diagnosis not present

## 2015-10-08 DIAGNOSIS — K900 Celiac disease: Secondary | ICD-10-CM | POA: Insufficient documentation

## 2015-10-08 DIAGNOSIS — D508 Other iron deficiency anemias: Secondary | ICD-10-CM | POA: Diagnosis not present

## 2015-10-08 DIAGNOSIS — G43709 Chronic migraine without aura, not intractable, without status migrainosus: Secondary | ICD-10-CM | POA: Diagnosis not present

## 2015-10-08 DIAGNOSIS — R030 Elevated blood-pressure reading, without diagnosis of hypertension: Secondary | ICD-10-CM

## 2015-10-08 DIAGNOSIS — R002 Palpitations: Secondary | ICD-10-CM | POA: Diagnosis not present

## 2015-10-08 DIAGNOSIS — R4586 Emotional lability: Secondary | ICD-10-CM

## 2015-10-08 LAB — CBC WITH DIFFERENTIAL/PLATELET
Basophils Absolute: 0.1 10*3/uL (ref 0.0–0.1)
Basophils Relative: 2 % — ABNORMAL HIGH (ref 0–1)
Eosinophils Absolute: 0.3 10*3/uL (ref 0.0–0.7)
Eosinophils Relative: 6 % — ABNORMAL HIGH (ref 0–5)
HEMATOCRIT: 48.1 % — AB (ref 36.0–46.0)
HEMOGLOBIN: 16.2 g/dL — AB (ref 12.0–15.0)
LYMPHS PCT: 29 % (ref 12–46)
Lymphs Abs: 1.6 10*3/uL (ref 0.7–4.0)
MCH: 32.1 pg (ref 26.0–34.0)
MCHC: 33.7 g/dL (ref 30.0–36.0)
MCV: 95.4 fL (ref 78.0–100.0)
MONOS PCT: 8 % (ref 3–12)
MPV: 10.7 fL (ref 8.6–12.4)
Monocytes Absolute: 0.4 10*3/uL (ref 0.1–1.0)
NEUTROS ABS: 3 10*3/uL (ref 1.7–7.7)
Neutrophils Relative %: 55 % (ref 43–77)
Platelets: 257 10*3/uL (ref 150–400)
RBC: 5.04 MIL/uL (ref 3.87–5.11)
RDW: 13 % (ref 11.5–15.5)
WBC: 5.4 10*3/uL (ref 4.0–10.5)

## 2015-10-08 LAB — POCT URINALYSIS DIPSTICK
BILIRUBIN UA: NEGATIVE
Blood, UA: NEGATIVE
GLUCOSE UA: NEGATIVE
KETONES UA: NEGATIVE
LEUKOCYTES UA: NEGATIVE
Nitrite, UA: NEGATIVE
PH UA: 8
Protein, UA: NEGATIVE
Spec Grav, UA: 1.015
Urobilinogen, UA: NEGATIVE

## 2015-10-08 LAB — FOLATE

## 2015-10-08 LAB — T4, FREE: FREE T4: 1.51 ng/dL (ref 0.80–1.80)

## 2015-10-08 LAB — T3: T3 TOTAL: 145 ng/dL (ref 80.0–204.0)

## 2015-10-08 LAB — VITAMIN B12: Vitamin B-12: 869 pg/mL (ref 211–911)

## 2015-10-08 LAB — FERRITIN: Ferritin: 32 ng/mL (ref 10–291)

## 2015-10-08 LAB — TSH: TSH: 1.675 u[IU]/mL (ref 0.350–4.500)

## 2015-10-08 MED ORDER — NARATRIPTAN HCL 2.5 MG PO TABS: 2.5000 mg | ORAL_TABLET | ORAL | Status: DC | PRN

## 2015-10-08 MED ORDER — BUPROPION HCL ER (XL) 150 MG PO TB24: 150.0000 mg | ORAL_TABLET | Freq: Every day | ORAL | Status: DC

## 2015-10-08 MED ORDER — LEVOTHYROXINE SODIUM 50 MCG PO TABS: 50.0000 ug | ORAL_TABLET | Freq: Once | ORAL | Status: DC

## 2015-10-08 NOTE — Telephone Encounter (Signed)
Pt would like to make sure that lab results are sent to [facility]

## 2015-10-08 NOTE — Progress Notes (Signed)
Subjective: Chief Complaint  Patient presents with  . Annual Exam    urine obtained. sees eye doctor. said she needs refills on wellbutrin xl, synthroid, and naratriptan. pt brought pill bottles for you. sees gyn dr bender. mailed immunizations 2-3 months ago to your attention I have not seen them. pt asked for an ekg today said that she had one in 2015 that was fine.    Here for a physical  Sees gynecology still in [facility].  Crisoforo Oxford, PA-C here for primary care Sees PT and chiropractor occasionally Has dentist, sees eye doctor Sees dermatology  Concerns: Wants some specific labs given hx/o celiac, anemia, wants ferritin, B12, D, folate checked.  Wants EKG.  Runs marathons, very active in general, teaches spin class.   Lately feels like heart rate is too high.  Rest heart rate is usually in the 50s, but this is almost tachycardic to her.   No recent changes other than stress of being in graduate school.  Reviewed their medical, surgical, family, social, medication, and allergy history and updated chart as appropriate.  Past Medical History  Diagnosis Date  . Hypothyroidism   . Allergy   . Asthma   . Migraine headache without aura     gets bad nausea.  does well on Naratriptan and Relpax.  Failed Imitrex, Maxalt  . Mood change (HCC)     Wellbutrin, has used Zoloft prior  . Celiac disease     +prior biopsy  . Anemia   . Transfusion history     has had IV iron prior, Dr. Marin Olp prior management  . Stress fracture     severeral prior  . Wears contact lenses     Past Surgical History  Procedure Laterality Date  . Inguinal hernia repair      left  . Polypectomy      colon  . Colonoscopy  2015    [facility], advised to repeat q5 years; x 4 prior  . Esophagogastroduodenoscopy      x5 prior as of 2016  . Nasal septum surgery      Social History   Social History  . Marital Status: Single    Spouse Name: N/A  . Number of Children: N/A  . Years of  Education: N/A   Occupational History  . Not on file.   Social History Main Topics  . Smoking status: Never Smoker   . Smokeless tobacco: Never Used  . Alcohol Use: 0.6 oz/week    1 Glasses of wine per week  . Drug Use: No  . Sexual Activity: Not on file   Other Topics Concern  . 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 Hypertension Mother   . Cancer Father     asbestos related lung cancer  . Heart disease Father 61  . Pulmonary embolism Father   . Celiac disease Brother   . Stroke Maternal Grandmother   . Heart disease Maternal Grandfather   . Stroke Maternal Grandfather   . Heart disease Paternal Grandfather     died 19yo  . Diabetes Neg Hx   . Celiac disease Brother      Current outpatient prescriptions:  .  budesonide-formoterol (SYMBICORT) 160-4.5 MCG/ACT inhaler, Inhale 2  puffs into the lungs 2 (two) times daily., Disp: 1 Inhaler, Rfl: 3 .  buPROPion (WELLBUTRIN XL) 150 MG 24 hr tablet, Take 1 tablet (150 mg total) by mouth daily., Disp: 30 tablet, Rfl: 3 .  etonogestrel-ethinyl estradiol (NUVARING) 0.12-0.015 MG/24HR vaginal ring, Place 1 each vaginally every 28 (twenty-eight) days. Insert vaginally and leave in place for 3 consecutive weeks, then remove for 1 week., Disp: , Rfl:  .  levothyroxine (SYNTHROID, LEVOTHROID) 50 MCG tablet, Take 1 tablet (50 mcg total) by mouth once., Disp: 90 tablet, Rfl: 3 .  naratriptan (AMERGE) 2.5 MG tablet, Take 1 tablet (2.5 mg total) by mouth as needed for migraine. 1 tab with headache, may repeat after 4 hours; max 5 mg in 24 hours., Disp: 30 tablet, Rfl: 1 .  RABEprazole Sodium (ACIPHEX PO), Take 20 mg by mouth 2 (two) times daily., Disp: , Rfl:  .  Albuterol Sulfate (PROAIR RESPICLICK) 127  (90 BASE) MCG/ACT AEPB, Inhale 2 puffs into the lungs every 6 (six) hours as needed. (Patient not taking: Reported on 10/08/2015), Disp: 1 each, Rfl: 0  Allergies  Allergen Reactions  . Singulair [Montelukast Sodium]     Angry, irritable, out of skin feeling  . Sulfa Antibiotics     Review of Systems Constitutional: -fever, -chills, -sweats, -unexpected weight change, -decreased appetite, -fatigue Allergy: -sneezing, -itching, -congestion Dermatology: -changing moles, --rash, -lumps ENT: -runny nose, -ear pain, -sore throat, -hoarseness, -sinus pain, -teeth pain, - ringing in ears, -hearing loss, -nosebleeds Cardiology: -chest pain, -palpitations, -swelling, -difficulty breathing when lying flat, -waking up short of breath Respiratory: -cough, -shortness of breath, -difficulty breathing with exercise or exertion, -wheezing, -coughing up blood Gastroenterology: -abdominal pain, -nausea, -vomiting, -diarrhea, -constipation, -blood in stool, -changes in bowel movement, -difficulty swallowing or eating Hematology: -bleeding, -bruising  Musculoskeletal: -joint aches, -muscle aches, -joint swelling, -back pain, -neck pain, -cramping, -changes in gait Ophthalmology: denies vision changes, eye redness, itching, discharge Urology: -burning with urination, -difficulty urinating, -blood in urine, -urinary frequency, -urgency, -incontinence Neurology: -headache, -weakness, -tingling, -numbness, -memory loss, -falls, -dizziness Psychology: -depressed mood, -agitation, -sleep problems       Objective:   Physical Exam  BP 130/100 mmHg  Pulse 72  Ht 5' 7.5" (1.715 m)  Wt 156 lb (70.761 kg)  BMI 24.06 kg/m2  LMP 09/27/2015  General appearance: alert, no distress, WD/WN, lean white Skin: few scattered benign appearing macules, few biopsy scars of bilat low back, right upper arm biopsy scar, no worrisome lesions HEENT: normocephalic, conjunctiva/corneas normal, sclerae anicteric, PERRLA, EOMi,  nares patent, no discharge or erythema, pharynx normal Oral cavity: MMM, tongue normal, teeth normal Neck: supple, no lymphadenopathy, no thyromegaly, no masses, normal ROM, no bruits Chest: non tender, normal shape and expansion Heart: RRR, normal S1, S2, no murmurs Lungs: CTA bilaterally, no wheezes, rhonchi, or rales Abdomen: +bs, soft, non tender, non distended, no masses, no hepatomegaly, no splenomegaly, no bruits Back: non tender, normal ROM, no scoliosis Musculoskeletal: upper extremities non tender, no obvious deformity, normal ROM throughout, lower extremities non tender, no obvious deformity, normal ROM throughout Extremities: no edema, no cyanosis, no clubbing Pulses: 2+ symmetric, upper and lower extremities, normal cap refill Neurological: alert, oriented x 3, CN2-12 intact, strength normal upper extremities and lower extremities, sensation normal throughout, DTRs 2+ throughout, no cerebellar signs, gait normal Psychiatric: normal affect, behavior normal, pleasant  Breast/gyn/rectal - deferred to gynecology   Adult ECG Report  Indication: physical, elevated BP  Rate: 61 bpm  Rhythm: normal sinus  rhythm and sinus arrhythmia  QRS Axis: 65 degrees  PR Interval: 118m  QRS Duration: 780m QTc: 43029mConduction Disturbances: none  Other Abnormalities: none  Patient's cardiac risk factors are: none.  EKG comparison: none  Narrative Interpretation: sinus arrhythmia, otherwise normal EKG      Assessment and Plan :    Encounter Diagnoses  Name Primary?  . Encounter for health maintenance examination in adult Yes  . Celiac disease   . Mood change ([facility] [place] . Elevated blood-pressure reading without diagnosis of hypertension   . Palpitations   . Chronic migraine without aura without status migrainosus, not intractable   . Hypothyroidism, unspecified hypothyroidism type   . Vitamin D deficiency   . Other iron deficiency anemias     Physical exam - discussed healthy  lifestyle, diet, exercise, preventative care, vaccinations, and addressed their concerns.  Handout given. See your eye doctor yearly for routine vision care. See your dentist yearly for routine dental care including hygiene visits twice yearly. See your gynecologist yearly for routine gynecological care. Labs today EKG reviewed. C/t current medications C/t yearly flu shot Stable on current medication Discussed her concerns, f/u pending labs.

## 2015-10-08 NOTE — Addendum Note (Signed)
Addended by: Billie Lade on: 10/08/2015 01:48 PM   Modules accepted: Orders, SmartSet

## 2015-10-09 ENCOUNTER — Encounter: Payer: BLUE CROSS/BLUE SHIELD | Admitting: Medical

## 2015-10-09 LAB — COMPREHENSIVE METABOLIC PANEL
ALBUMIN: 4.2 g/dL (ref 3.6–5.1)
ALT: 25 U/L (ref 6–29)
AST: 24 U/L (ref 10–30)
Alkaline Phosphatase: 41 U/L (ref 33–115)
BILIRUBIN TOTAL: 0.5 mg/dL (ref 0.2–1.2)
BUN: 9 mg/dL (ref 7–25)
CALCIUM: 9.7 mg/dL (ref 8.6–10.2)
CHLORIDE: 101 mmol/L (ref 98–110)
CO2: 28 mmol/L (ref 20–31)
Creat: 0.9 mg/dL (ref 0.50–1.10)
Glucose, Bld: 83 mg/dL (ref 65–99)
POTASSIUM: 4.2 mmol/L (ref 3.5–5.3)
Sodium: 137 mmol/L (ref 135–146)
Total Protein: 7.4 g/dL (ref 6.1–8.1)

## 2015-10-09 LAB — LIPID PANEL
CHOL/HDL RATIO: 2.8 ratio (ref <=5.0)
Cholesterol: 229 mg/dL — ABNORMAL HIGH (ref 125–200)
HDL: 83 mg/dL (ref >=46)
LDL Cholesterol: 134 mg/dL — ABNORMAL HIGH (ref <130)
Triglycerides: 61 mg/dL (ref <150)
VLDL: 12 mg/dL (ref <30)

## 2015-10-09 LAB — VITAMIN D 25 HYDROXY (VIT D DEFICIENCY, FRACTURES): Vit D, 25-Hydroxy: 56 ng/mL (ref 30–100)

## 2015-10-12 ENCOUNTER — Institutional Professional Consult (permissible substitution): Payer: Self-pay | Admitting: Pulmonary Disease

## 2015-10-19 ENCOUNTER — Telehealth: Payer: Self-pay | Admitting: Medical

## 2015-10-19 ENCOUNTER — Other Ambulatory Visit: Payer: Self-pay | Admitting: Medical

## 2015-10-19 NOTE — Telephone Encounter (Signed)
Not listed in her med list, is this ok to fill?

## 2015-10-19 NOTE — Telephone Encounter (Signed)
Will not refill

## 2015-10-19 NOTE — Telephone Encounter (Signed)
Cancel as I believe she is allergic to this.

## 2015-10-19 NOTE — Telephone Encounter (Signed)
Rcvd refill request for Montelukast 68m #90

## 2015-10-20 ENCOUNTER — Telehealth: Payer: Self-pay | Admitting: Medical

## 2015-10-20 NOTE — Telephone Encounter (Signed)
pls call and verify as we have Singulair listed as allergy.  If she is not allergic to this, then we need to take this out of allergies and send refills.   Please check.

## 2015-10-20 NOTE — Telephone Encounter (Signed)
Recv'd fax refill request for Montelukast 10 mg #90 to [facility]

## 2015-10-20 NOTE — Telephone Encounter (Signed)
LMTCB

## 2015-10-21 NOTE — Telephone Encounter (Signed)
LMTCB

## 2015-10-21 NOTE — Telephone Encounter (Signed)
Pt said she is allergic. Is not asking for refill for this

## 2015-12-03 ENCOUNTER — Institutional Professional Consult (permissible substitution): Payer: Self-pay | Admitting: Pulmonary Disease

## 2015-12-11 ENCOUNTER — Encounter: Payer: Self-pay | Admitting: Medical

## 2015-12-11 ENCOUNTER — Ambulatory Visit (INDEPENDENT_AMBULATORY_CARE_PROVIDER_SITE_OTHER): Payer: BLUE CROSS/BLUE SHIELD | Admitting: Medical

## 2015-12-11 VITALS — BP 108/72 | HR 73 | Temp 98.5°F | Resp 18 | Wt 159.0 lb

## 2015-12-11 DIAGNOSIS — H6506 Acute serous otitis media, recurrent, bilateral: Secondary | ICD-10-CM | POA: Diagnosis not present

## 2015-12-11 DIAGNOSIS — J4540 Moderate persistent asthma, uncomplicated: Secondary | ICD-10-CM | POA: Diagnosis not present

## 2015-12-11 MED ORDER — METHYLPREDNISOLONE SODIUM SUCC 125 MG IJ SOLR
125.0000 mg | Freq: Once | INTRAMUSCULAR | Status: AC
  Administered 2015-12-11: 125 mg via INTRAMUSCULAR

## 2015-12-11 MED ORDER — AMOXICILLIN-POT CLAVULANATE 875-125 MG PO TABS
1.0000 | ORAL_TABLET | Freq: Two times a day (BID) | ORAL | Status: DC
Start: 2015-12-11 — End: 2016-04-08

## 2015-12-11 MED ORDER — ALBUTEROL SULFATE (2.5 MG/3ML) 0.083% IN NEBU: 2.5000 mg | INHALATION_SOLUTION | Freq: Four times a day (QID) | RESPIRATORY_TRACT | Status: DC | PRN

## 2015-12-11 NOTE — Progress Notes (Signed)
Subjective: Chief Complaint  Patient presents with  . ear problems    11/12/15 she got sick with cough and conjestion. since then her ear has been bothering her has used ear drops, ear wax softener, and salt water. has started swimming again. cough is coming back. taking 2 mucinex and a baby sudafed pt states.    Here for possible ear wax buildup vs infection.   Got sick while in [place] in January.  Had 2 weeks of cough, congestion. But since then having ear itching, sometimes ache.  Has used sudafed, ear wax drops, irritation.  Just started coughing again the last few days.  This is giving her mild asthma flare. No other aggravating or relieving factors. No other complaint.   Past Medical History  Diagnosis Date  . Hypothyroidism   . Allergy   . Asthma   . Migraine headache without aura     gets bad nausea.  does well on Naratriptan and Relpax.  Failed Imitrex, Maxalt  . Mood change (HCC)     Wellbutrin, has used Zoloft prior  . Celiac disease     +prior biopsy  . Anemia   . Transfusion history     has had IV iron prior, Dr. Marin Olp prior management  . Stress fracture     severeral prior  . Wears contact lenses    ROS as in subjective  Objective: BP 108/72 mmHg  Pulse 73  Temp(Src) 98.5 F (36.9 C) (Tympanic)  Resp 18  Wt 159 lb (72.122 kg)  SpO2 99%  LMP 11/24/2015  General appearance: alert, no distress, WD/WN HEENT: normocephalic, sclerae anicteric, TMs flat with mild erythema and serous fluid, nares patent, no discharge or erythema, pharynx normal Oral cavity: MMM, no lesions Neck: supple, no lymphadenopathy, no thyromegaly, no masses Heart: RRR, normal S1, S2, no murmurs Lungs: CTA bilaterally, no wheezes, rhonchi, or rales    Assessment: Encounter Diagnoses  Name Primary?  . Recurrent serous otitis media of both ears, unspecified chronicity Yes  . Asthma, chronic, moderate persistent, uncomplicated      Plan: Discussed symptoms, concerns, gave IM  Solumedrol injection, begin Amoxicillin, gave script for home nebulizer equipment.  Shelbia was seen today for ear problems.  Diagnoses and all orders for this visit:  Recurrent serous otitis media of both ears, unspecified chronicity -     methylPREDNISolone sodium succinate (SOLU-MEDROL) 125 mg/2 mL injection 125 mg; Inject 2 mLs (125 mg total) into the muscle once.  Asthma, chronic, moderate persistent, uncomplicated  Other orders -     amoxicillin-clavulanate (AUGMENTIN) 875-125 MG tablet; Take 1 tablet by mouth 2 (two) times daily. -     albuterol (PROVENTIL) (2.5 MG/3ML) 0.083% nebulizer solution; Take 3 mLs (2.5 mg total) by nebulization every 6 (six) hours as needed for wheezing or shortness of breath.

## 2016-01-03 ENCOUNTER — Other Ambulatory Visit: Payer: Self-pay | Admitting: Medical

## 2016-01-04 NOTE — Telephone Encounter (Signed)
Is this ok to refill?  

## 2016-01-22 ENCOUNTER — Institutional Professional Consult (permissible substitution): Payer: Self-pay | Admitting: Pulmonary Disease

## 2016-02-05 ENCOUNTER — Institutional Professional Consult (permissible substitution): Payer: Self-pay | Admitting: Pulmonary Disease

## 2016-02-29 ENCOUNTER — Other Ambulatory Visit: Payer: Self-pay | Admitting: Medical

## 2016-02-29 NOTE — Telephone Encounter (Signed)
Is this ok to refill?  

## 2016-03-15 DIAGNOSIS — M9905 Segmental and somatic dysfunction of pelvic region: Secondary | ICD-10-CM | POA: Diagnosis not present

## 2016-03-15 DIAGNOSIS — M25552 Pain in left hip: Secondary | ICD-10-CM | POA: Diagnosis not present

## 2016-03-15 DIAGNOSIS — M9902 Segmental and somatic dysfunction of thoracic region: Secondary | ICD-10-CM | POA: Diagnosis not present

## 2016-03-15 DIAGNOSIS — M791 Myalgia: Secondary | ICD-10-CM | POA: Diagnosis not present

## 2016-03-15 DIAGNOSIS — M25551 Pain in right hip: Secondary | ICD-10-CM | POA: Diagnosis not present

## 2016-03-15 DIAGNOSIS — M9901 Segmental and somatic dysfunction of cervical region: Secondary | ICD-10-CM | POA: Diagnosis not present

## 2016-03-15 DIAGNOSIS — M9904 Segmental and somatic dysfunction of sacral region: Secondary | ICD-10-CM | POA: Diagnosis not present

## 2016-03-16 DIAGNOSIS — F411 Generalized anxiety disorder: Secondary | ICD-10-CM | POA: Diagnosis not present

## 2016-03-16 DIAGNOSIS — R399 Unspecified symptoms and signs involving the genitourinary system: Secondary | ICD-10-CM | POA: Diagnosis not present

## 2016-03-16 DIAGNOSIS — N390 Urinary tract infection, site not specified: Secondary | ICD-10-CM | POA: Diagnosis not present

## 2016-04-06 DIAGNOSIS — R928 Other abnormal and inconclusive findings on diagnostic imaging of breast: Secondary | ICD-10-CM | POA: Diagnosis not present

## 2016-04-06 DIAGNOSIS — Z113 Encounter for screening for infections with a predominantly sexual mode of transmission: Secondary | ICD-10-CM | POA: Diagnosis not present

## 2016-04-06 DIAGNOSIS — Z01419 Encounter for gynecological examination (general) (routine) without abnormal findings: Secondary | ICD-10-CM | POA: Diagnosis not present

## 2016-04-06 DIAGNOSIS — Z3040 Encounter for surveillance of contraceptives, unspecified: Secondary | ICD-10-CM | POA: Diagnosis not present

## 2016-04-07 ENCOUNTER — Other Ambulatory Visit: Payer: Self-pay | Admitting: Obstetrics and Gynecology

## 2016-04-07 DIAGNOSIS — N63 Unspecified lump in unspecified breast: Secondary | ICD-10-CM

## 2016-04-08 ENCOUNTER — Ambulatory Visit (INDEPENDENT_AMBULATORY_CARE_PROVIDER_SITE_OTHER): Payer: BLUE CROSS/BLUE SHIELD | Admitting: Pulmonary Disease

## 2016-04-08 ENCOUNTER — Other Ambulatory Visit (INDEPENDENT_AMBULATORY_CARE_PROVIDER_SITE_OTHER): Payer: BLUE CROSS/BLUE SHIELD

## 2016-04-08 ENCOUNTER — Telehealth: Payer: Self-pay | Admitting: Pulmonary Disease

## 2016-04-08 ENCOUNTER — Encounter: Payer: Self-pay | Admitting: Pulmonary Disease

## 2016-04-08 VITALS — BP 122/74 | HR 72 | Ht 67.5 in | Wt 154.0 lb

## 2016-04-08 DIAGNOSIS — J4530 Mild persistent asthma, uncomplicated: Secondary | ICD-10-CM

## 2016-04-08 DIAGNOSIS — B370 Candidal stomatitis: Secondary | ICD-10-CM

## 2016-04-08 DIAGNOSIS — K219 Gastro-esophageal reflux disease without esophagitis: Secondary | ICD-10-CM | POA: Diagnosis not present

## 2016-04-08 LAB — CBC WITH DIFFERENTIAL/PLATELET
BASOS PCT: 0.2 % (ref 0.0–3.0)
Basophils Absolute: 0 10*3/uL (ref 0.0–0.1)
EOS PCT: 7.9 % — AB (ref 0.0–5.0)
Eosinophils Absolute: 0.5 10*3/uL (ref 0.0–0.7)
HCT: 45.2 % (ref 36.0–46.0)
Hemoglobin: 15.3 g/dL — ABNORMAL HIGH (ref 12.0–15.0)
LYMPHS ABS: 1.6 10*3/uL (ref 0.7–4.0)
Lymphocytes Relative: 24.2 % (ref 12.0–46.0)
MCHC: 33.9 g/dL (ref 30.0–36.0)
MCV: 93.2 fl (ref 78.0–100.0)
MONO ABS: 0.4 10*3/uL (ref 0.1–1.0)
MONOS PCT: 6.3 % (ref 3.0–12.0)
NEUTROS ABS: 4 10*3/uL (ref 1.4–7.7)
NEUTROS PCT: 61.4 % (ref 43.0–77.0)
PLATELETS: 263 10*3/uL (ref 150.0–400.0)
RBC: 4.84 Mil/uL (ref 3.87–5.11)
RDW: 12.5 % (ref 11.5–15.5)
WBC: 6.5 10*3/uL (ref 4.0–10.5)

## 2016-04-08 MED ORDER — NYSTATIN 100000 UNIT/ML MT SUSP
5.0000 mL | Freq: Three times a day (TID) | OROMUCOSAL | Status: DC
Start: 2016-04-08 — End: 2016-11-24

## 2016-04-08 MED ORDER — AEROCHAMBER MV MISC: Status: DC

## 2016-04-08 NOTE — Addendum Note (Signed)
Addended by: Maryanna Shape A on: 04/08/2016 04:49 PM   Modules accepted: Orders

## 2016-04-08 NOTE — Telephone Encounter (Signed)
IMAGING MAXILLOFACIAL CT W/O 1/25/5 (per radiologist): Clear paranasal sinuses with slight leftward nasal septal deviation. Slight inflammatory enlargement of the inferior nasal turbinates with mild mucosal thickening seen along the anterior nasal septum and inferior nasal cavity.  LABS 10/08/15 CBC: 5.4/60.2/48.1/257 BMP: 137/4.2/101/28/9/0.9/83/9.7 LFT: 4.2/7.4/0.5/41/24/25  10/04/13 ANA:  1:80 C3:  111 C4:  19.6 DS DNA Ab:  <1.0 Smith Ab:  <0.2 SCL-70:  <0.2 RNP Ab:  <0.2 SSA:  <0.2 SSB:  <0.2

## 2016-04-08 NOTE — Progress Notes (Signed)
Subjective:    Patient ID: Barbara Odonnell, female    DOB: July 22, 1976, 40 y.o.   MRN: [id number]  HPI Patient reports she was followed at [facility] in [place], [place], and subsequently in [place], [place], by Dr. Scherrie Gerlach. She reports previously she was better controlled. She reports she has noticed more coughing & wheezing predominantly after exercise for the last 2 weeks. She feels symptomatically she is getting worse. She reports she does have known silent reflux but denies any felt reflux, dyspepsia, or morning brash water taste. She reports she has been on Aciphex for some time. She reports she has more wheezing on a seasonal basis. No nocturnal awakenings with wheezing or coughing. She denies any dyspnea. She switched from Advair Diskus to Symbicort in October-November 2016. Has also previously been on Breo. She is using her rescue inhaler before exercise and also after exercise. She denies any recent sinus congestion, pressure, or post nasal drainage. She reports she was previously having 3 exacerbations yearly prior to switching to Symbicort. No chest pain or pressure. Rare chest tightness with exercise. Previously on Singular with shaking and irritability.   Review of Systems No fever, chills, or sweats. No rashes or abnormal bruising. A pertinent 14 point review of systems is negative except as per the history of presenting illness.  Allergies  Allergen Reactions  . Singulair [Montelukast Sodium]     Angry, irritable, out of skin feeling  . Sulfa Antibiotics     Current Outpatient Prescriptions on File Prior to Visit  Medication Sig Dispense Refill  . albuterol (PROVENTIL) (2.5 MG/3ML) 0.083% nebulizer solution Take 3 mLs (2.5 mg total) by nebulization every 6 (six) hours as needed for wheezing or shortness of breath. 75 mL 3  . Albuterol Sulfate (PROAIR RESPICLICK) 035 (90 BASE) MCG/ACT AEPB Inhale 2 puffs into the lungs every 6 (six) hours as needed. 1 each 0  .  etonogestrel-ethinyl estradiol (NUVARING) 0.12-0.015 MG/24HR vaginal ring Place 1 each vaginally every 28 (twenty-eight) days. Insert vaginally and leave in place for 3 consecutive weeks, then remove for 1 week.    . levothyroxine (SYNTHROID, LEVOTHROID) 50 MCG tablet Take 1 tablet (50 mcg total) by mouth once. 90 tablet 3  . naratriptan (AMERGE) 2.5 MG tablet TAKE 1 TABLET AS NEEDED FOR MIGRAINE, MAY REPEAT AFTER 4 HOURS (MAX 5MG IN 24HRS) 30 tablet 1  . RABEprazole Sodium (ACIPHEX PO) Take 20 mg by mouth 2 (two) times daily.    . SYMBICORT 160-4.5 MCG/ACT inhaler INHALE 2 PUFFS INTO THE LUNGS 2 (TWO) TIMES DAILY. 10.2 Inhaler 3   No current facility-administered medications on file prior to visit.    Past Medical History  Diagnosis Date  . Hypothyroidism   . Allergy   . Asthma   . Migraine headache without aura     gets bad nausea.  does well on Naratriptan and Relpax.  Failed Imitrex, Maxalt  . Mood change (HCC)     Wellbutrin, has used Zoloft prior  . Celiac disease     +prior biopsy  . Anemia   . Transfusion history     has had IV iron prior, Dr. Marin Olp prior management  . Stress fracture     severeral prior  . Wears contact lenses   . GERD (gastroesophageal reflux disease)     Past Surgical History  Procedure Laterality Date  . Inguinal hernia repair      left  . Polypectomy      colon  . Colonoscopy  [address]    [facility], advised to repeat q5 years; x 4 prior  . Esophagogastroduodenoscopy      x5 prior as of 2016  . Nasal septum surgery      Family History  Problem Relation Age of Onset  . Hypertension Mother   . Cancer Father     asbestos related lung cancer  . Heart disease Father 15  . Pulmonary embolism Father   . Celiac disease Brother   . Stroke Maternal Grandmother   . COPD Maternal Grandmother   . Heart disease Maternal Grandfather   . Stroke Maternal Grandfather   . Heart disease Paternal Grandfather     died 41yo  . Diabetes Neg Hx   . Celiac  disease Brother     Social History   Social History  . Marital Status: Single    Spouse Name: N/A  . Number of Children: N/A  . Years of Education: N/A   Social History Main Topics  . Smoking status: Never Smoker   . Smokeless tobacco: Never Used  . Alcohol Use: 0.6 oz/week    1 Glasses of wine per week  . Drug Use: No  . Sexual Activity: Not Asked   Other Topics Concern  . 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      [facility]:   Originally from [place]. Has also lived in [place], [place], & [place]. Currently working in counseling. No pets currently. No bird exposure. No mold or hot tub exposure. Currently has carpet in the bedroom. No draperies. No exposure to asbestos.       Objective:   Physical Exam BP 122/74 mmHg  Pulse 72  Ht 5' 7.5" (1.715 m)  Wt 154 lb (69.854 kg)  BMI 23.75 kg/m2  SpO2 98% General:  Awake. Alert. No acute distress. Thin, caucasian female.  Integument:  Warm & dry. No rash on exposed skin. No bruising. Lymphatics:  No appreciated cervical or supraclavicular lymphadenoapthy. HEENT:  Moist mucus membranes. No oral ulcers. No scleral injection or icterus. Mild bilateral nasal turbinate swelling. Cardiovascular:  Regular rate. No edema. No appreciable JVD.  Pulmonary:  Good aeration & clear to auscultation bilaterally. Symmetric chest wall expansion. No accessory muscle use. Abdomen: Soft. Normal bowel sounds. Nondistended. Grossly nontender. Musculoskeletal:  Normal bulk and tone. Hand grip strength 5/5 bilaterally. No joint deformity or effusion appreciated. Neurological:  CN 2-12 grossly in tact. No meningismus. Moving all 4 extremities equally. Symmetric brachioradialis deep tendon reflexes. Psychiatric:  Mood and affect congruent. Speech normal rhythm, rate & tone.     IMAGING MAXILLOFACIAL CT W/O 1/25/5 (per radiologist): Clear paranasal sinuses with slight leftward nasal septal deviation. Slight inflammatory enlargement of the inferior nasal turbinates with mild mucosal thickening seen along the anterior nasal septum and inferior nasal cavity.  LABS 10/08/15 CBC: 5.4/60.2/48.1/257 BMP: 137/4.2/101/28/9/0.9/83/9.7 LFT: 4.2/7.4/0.5/41/24/25  10/04/13 ANA: 1:80 C3: 111 C4: 19.6 DS DNA Ab: <1.0 Smith Ab: <0.2 SCL-70: <0.2 RNP Ab: <0.2 SSA: <0.2 SSB: <0.2    Assessment & Plan:  40 year old Caucasian female with long-standing history of asthma. Symptoms seem to be exercise-induced at this point although she does have seasonal/environmental allergies that are likely contributing. Previously intolerant of Singulair due to mood disturbance. The severity of her asthma is unclear at this time without pulmonary function testing to confirm  or deny fixed airway obstruction. Her silent laryngo-esophageal reflux well-controlled at this time but I do question the possibility of forced reflux post exercise. I instructed the patient contact my office if she had any new breathing problems or questions before her next appointment as it would be happy to see her sooner. I will maintain the patient on her current inhaled medication at this time but by her with a spacer to improve drug delivery and prevent further oral thrush.  1. Mild, persistent asthma: Checking full pulmonary function testing at follow-up appointment. Continuing Symbicort with spacer. Obtaining records from her pulmonologist in [place]. Checking serum IgE, RAST panel, & CBC with differential. 2. GERD: Continuing on AcipHex. Checking esophagogram for possible silent laryngo-esophageal reflux ongoing. 3. Oral thrush: Starting nystatin swish and swallow oral 3 times a day until thrush is gone. Counseled patient on appropriate oral hygiene. 4. Follow-up: Patient to return to clinic in 4-6  weeks or sooner if needed.  Sonia Baller Ashok Cordia, M.D. [facility] Pulmonary & Critical Care Pager:  [phone] After 3pm or if no response, call [phone] 2:20 PM 04/08/2016

## 2016-04-08 NOTE — Addendum Note (Signed)
Addended by: Virl Cagey on: 04/08/2016 02:58 PM   Modules accepted: Orders

## 2016-04-08 NOTE — Patient Instructions (Signed)
   Use your Symbicort with a spacer.  Remember to rinse, gargle, brush your teeth, & brush your tongue after you use your Symbicort.  We will do a breathing test at your next appointment in 4-6 weeks.  TESTS ORDERED: 1. Full PFT at follow-up appointment 2. Serum IgE, CBC w/ diff, & RAST Panel 3. Esophagram

## 2016-04-13 ENCOUNTER — Other Ambulatory Visit: Payer: Self-pay | Admitting: Obstetrics and Gynecology

## 2016-04-14 ENCOUNTER — Ambulatory Visit
Admission: RE | Admit: 2016-04-14 | Discharge: 2016-04-14 | Disposition: A | Discharge status: Home / Self Care | Payer: BLUE CROSS/BLUE SHIELD | Source: Ambulatory Visit | Attending: Obstetrics and Gynecology | Admitting: Obstetrics and Gynecology

## 2016-04-14 DIAGNOSIS — N6489 Other specified disorders of breast: Secondary | ICD-10-CM | POA: Diagnosis not present

## 2016-04-14 DIAGNOSIS — N63 Unspecified lump in unspecified breast: Secondary | ICD-10-CM

## 2016-04-15 ENCOUNTER — Ambulatory Visit (HOSPITAL_COMMUNITY)
Admission: RE | Admit: 2016-04-15 | Discharge: 2016-04-15 | Disposition: A | Discharge status: Home / Self Care | Payer: BLUE CROSS/BLUE SHIELD | Source: Ambulatory Visit | Attending: Pulmonary Disease | Admitting: Pulmonary Disease

## 2016-04-15 DIAGNOSIS — K219 Gastro-esophageal reflux disease without esophagitis: Secondary | ICD-10-CM | POA: Diagnosis not present

## 2016-04-15 DIAGNOSIS — Z8719 Personal history of other diseases of the digestive system: Secondary | ICD-10-CM | POA: Diagnosis not present

## 2016-04-21 ENCOUNTER — Encounter (HOSPITAL_COMMUNITY): Payer: BLUE CROSS/BLUE SHIELD

## 2016-04-25 ENCOUNTER — Ambulatory Visit (HOSPITAL_COMMUNITY)
Admission: RE | Admit: 2016-04-25 | Discharge: 2016-04-25 | Disposition: A | Discharge status: Home / Self Care | Payer: BLUE CROSS/BLUE SHIELD | Source: Ambulatory Visit | Attending: Pulmonary Disease | Admitting: Pulmonary Disease

## 2016-04-25 DIAGNOSIS — J4530 Mild persistent asthma, uncomplicated: Secondary | ICD-10-CM | POA: Diagnosis not present

## 2016-04-25 LAB — PULMONARY FUNCTION TEST
DL/VA % PRED: 157 %
DL/VA: 5.99 ml/min/mmHg/L
DLCO COR % PRED: 228 %
DLCO COR: 35.51 ml/min/mmHg
DLCO unc % pred: 241 %
DLCO unc: 37.42 ml/min/mmHg
FEF 25-75 POST: 2.82 L/s
FEF 25-75 Pre: 1.91 L/sec
FEF2575-%CHANGE-POST: 47 %
FEF2575-%PRED-POST: 104 %
FEF2575-%PRED-PRE: 71 %
FEV1-%CHANGE-POST: 9 %
FEV1-%Pred-Post: 136 %
FEV1-%Pred-Pre: 125 %
FEV1-POST: 3.26 L
FEV1-Pre: 2.98 L
FEV1FVC-%CHANGE-POST: 10 %
FEV1FVC-%PRED-PRE: 86 %
FEV6-%Change-Post: -1 %
FEV6-%Pred-Post: 142 %
FEV6-%Pred-Pre: 144 %
FEV6-POST: 4.09 L
FEV6-Pre: 4.15 L
FEV6FVC-%Change-Post: 0 %
FEV6FVC-%PRED-POST: 102 %
FEV6FVC-%Pred-Pre: 101 %
FVC-%CHANGE-POST: 0 %
FVC-%PRED-POST: 142 %
FVC-%PRED-PRE: 143 %
FVC-POST: 4.16 L
FVC-PRE: 4.19 L
POST FEV1/FVC RATIO: 78 %
PRE FEV1/FVC RATIO: 71 %
PRE FEV6/FVC RATIO: 100 %
Post FEV6/FVC ratio: 100 %
RV % pred: 225 %
RV: 2.96 L
TLC % PRED: 167 %
TLC: 6.82 L

## 2016-04-25 MED ORDER — ALBUTEROL SULFATE (2.5 MG/3ML) 0.083% IN NEBU
2.5000 mg | INHALATION_SOLUTION | Freq: Once | RESPIRATORY_TRACT | Status: AC
  Administered 2016-04-25: 2.5 mg via RESPIRATORY_TRACT

## 2016-04-26 ENCOUNTER — Encounter: Payer: Self-pay | Admitting: Pulmonary Disease

## 2016-04-26 ENCOUNTER — Ambulatory Visit (INDEPENDENT_AMBULATORY_CARE_PROVIDER_SITE_OTHER): Payer: BLUE CROSS/BLUE SHIELD | Admitting: Pulmonary Disease

## 2016-04-26 VITALS — BP 114/72 | HR 78 | Ht 67.5 in | Wt 152.2 lb

## 2016-04-26 DIAGNOSIS — J4530 Mild persistent asthma, uncomplicated: Secondary | ICD-10-CM

## 2016-04-26 DIAGNOSIS — K219 Gastro-esophageal reflux disease without esophagitis: Secondary | ICD-10-CM | POA: Diagnosis not present

## 2016-04-26 MED ORDER — EPINEPHRINE 0.3 MG/0.3ML IJ SOAJ: 0.3000 mg | Freq: Once | INTRAMUSCULAR | Status: DC

## 2016-04-26 MED ORDER — RANITIDINE HCL 150 MG PO TABS: 150.0000 mg | ORAL_TABLET | Freq: Every day | ORAL | Status: DC

## 2016-04-26 NOTE — Patient Instructions (Signed)
   Take your Symbicort inhaler 2 puffs twice daily. After 2-3 weeks you can back off to 2 puffs once daily if your coughing & wheezing have improved significantly.  I'm starting you on Zantac 116m at night before bed to help with your symptoms. Call me if you have any new problems with this medication.  I will see you back in 2-3 months or sooner if needed with a breathing test at that time.  TESTS ORDERED: 1. Pulmonary history with bronchodilator challenge at next appointment

## 2016-04-26 NOTE — Progress Notes (Signed)
Subjective:    Patient ID: Barbara Odonnell, female    DOB: 1976-04-13, 40 y.o.   MRN: [id number]  C.C.:  Follow-up for Mild, Persistent Asthma & GERD.  HPI Mild, Persistent Asthma: Using her Symbicort once daily currently in th emorning. She reports she is still wheezing in the morning after waking up. She reports her wheezing is also more significant after exercise. She is also coughing at the end of exercise as well. No mucus production. She reports she is using her spacer with her Symbicort. She reports she feels the albuterol inhaler is helping. She is using her ProAir inhaler befor exercise. Denies any nocturnal awakenings with coughing, wheezing, or dyspnea. No dyspnea during her spinning classes. No limitation to exercise.  GERD: No evidence of significant reflux on esophagram. No reflux, dyspepsia, or morning brash water taste. Continues to take Aciphex daily.   Review of Systems  No chest pain or pressure. No chest tightness. No fever, chills, or sweats. No nausea or emesis.   Allergies  Allergen Reactions  . Singulair [Montelukast Sodium]     Angry, irritable, out of skin feeling  . Sulfa Antibiotics     Current Outpatient Prescriptions on File Prior to Visit  Medication Sig Dispense Refill  . albuterol (PROVENTIL) (2.5 MG/3ML) 0.083% nebulizer solution Take 3 mLs (2.5 mg total) by nebulization every 6 (six) hours as needed for wheezing or shortness of breath. 75 mL 3  . Albuterol Sulfate (PROAIR RESPICLICK) 859 (90 BASE) MCG/ACT AEPB Inhale 2 puffs into the lungs every 6 (six) hours as needed. 1 each 0  . etonogestrel-ethinyl estradiol (NUVARING) 0.12-0.015 MG/24HR vaginal ring Place 1 each vaginally every 28 (twenty-eight) days. Insert vaginally and leave in place for 3 consecutive weeks, then remove for 1 week.    . levothyroxine (SYNTHROID, LEVOTHROID) 50 MCG tablet Take 1 tablet (50 mcg total) by mouth once. 90 tablet 3  . naratriptan (AMERGE) 2.5 MG tablet TAKE 1 TABLET AS  NEEDED FOR MIGRAINE, MAY REPEAT AFTER 4 HOURS (MAX 5MG IN 24HRS) 30 tablet 1  . nystatin (MYCOSTATIN) 100000 UNIT/ML suspension Use as directed 5 mLs (500,000 Units total) in the mouth or throat 3 (three) times daily. 180 mL 0  . RABEprazole Sodium (ACIPHEX PO) Take 20 mg by mouth 2 (two) times daily.    Marland Kitchen Spacer/Aero-Holding Chambers (AEROCHAMBER MV) inhaler Use as instructed 1 each 0  . SYMBICORT 160-4.5 MCG/ACT inhaler INHALE 2 PUFFS INTO THE LUNGS 2 (TWO) TIMES DAILY. 10.2 Inhaler 3   No current facility-administered medications on file prior to visit.    Past Medical History  Diagnosis Date  . Hypothyroidism   . Allergy   . Asthma   . Migraine headache without aura     gets bad nausea.  does well on Naratriptan and Relpax.  Failed Imitrex, Maxalt  . Mood change (HCC)     Wellbutrin, has used Zoloft prior  . Celiac disease     +prior biopsy  . Anemia   . Transfusion history     has had IV iron prior, Dr. Marin Olp prior management  . Stress fracture     severeral prior  . Wears contact lenses   . GERD (gastroesophageal reflux disease)     Past Surgical History  Procedure Laterality Date  . Inguinal hernia repair      left  . Polypectomy      colon  . Colonoscopy  2015    [facility], advised to repeat q5 years; x  4 prior  . Esophagogastroduodenoscopy      x5 prior as of 2016  . Nasal septum surgery      Family History  Problem Relation Age of Onset  . Hypertension Mother   . Cancer Father     asbestos related lung cancer  . Heart disease Father 51  . Pulmonary embolism Father   . Celiac disease Brother   . Stroke Maternal Grandmother   . COPD Maternal Grandmother   . Heart disease Maternal Grandfather   . Stroke Maternal Grandfather   . Heart disease Paternal Grandfather     died 50yo  . Diabetes Neg Hx   . Celiac disease Brother     Social History   Social History  . Marital Status: Single    Spouse Name: N/A  . Number of Children: N/A  . Years of  Education: N/A   Social History Main Topics  . Smoking status: Never Smoker   . Smokeless tobacco: Never Used  . Alcohol Use: 0.6 oz/week    1 Glasses of wine per week  . Drug Use: No  . Sexual Activity: Not Asked   Other Topics Concern  . 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      [facility]:   Originally from [place]. Has also lived in [place], [place], & [place]. Currently working in counseling. No pets currently. No bird exposure. No mold or hot tub exposure. Currently has carpet in the bedroom. No draperies. No exposure to asbestos.       Objective:   Physical Exam BP 114/72 mmHg  Pulse 78  Ht 5' 7.5" (1.715 m)  Wt 152 lb 3.2 oz (69.037 kg)  BMI 23.47 kg/m2  SpO2 98%  LMP 04/12/2016 General:  Awake. Alert. No distress.  Integument:  Warm & dry. No rash on exposed skin.  Lymphatics:  No appreciated cervical or supraclavicular lymphadenoapthy. HEENT:  Moist mucus membranes. No oral ulcers. No scleral injection. Mild bilateral nasal turbinate swelling. Cardiovascular:  Regular rate. No edema. Normal S1 & S2. Pulmonary:  Clear on auscultation bilaterally. Speaking in complete sentences. No wheezing on forced exhalation. Musculoskeletal:  Normal bulk and tone. No joint deformity or effusion appreciated.  PFT 04/25/16: FVC 4.19 L (143%) FEV1 2.98 L (125%) FEV1/FVC 0.71 FEF 25-75 1.91 L (71%) no bronchodilator response TLC 6.82 L (167%) RV 225% ERV 157% DLCO corrected 228% (hemoglobin 15.3)  IMAGING BARIUM SWALLOW 04/15/16 (per radiologist): Obvious patent. No stricture or mass. No hiatal hernia. Mild reflux in reverse Trendelenburg.  MAXILLOFACIAL CT W/O 1/25/5 (per radiologist): Clear paranasal sinuses with slight leftward nasal septal deviation. Slight inflammatory enlargement of the inferior  nasal turbinates with mild mucosal thickening seen along the anterior nasal septum and inferior nasal cavity.  LABS 04/08/16 CBC: 6.5/15.3/45.2/263 Eosinophil: 0.5 IgE:  72  10/08/15 CBC: 5.4/60.2/48.1/257 BMP: 137/4.2/101/28/9/0.9/83/9.7 LFT: 4.2/7.4/0.5/41/24/25  10/04/13 ANA: 1:80 C3: 111 C4: 19.6 DS DNA Ab: <1.0 Smith Ab: <0.2 SCL-70: <0.2 RNP Ab: <0.2 SSA: <0.2 SSB: <0.2    Assessment & Plan:  40 year old Caucasian female with long-standing history of asthma. Continues to have mild, intermittent symptoms that seem to be predominantly exercise-induced. Previously intolerant of Singulair. I do not believe she would benefit from the Biologics given her previous serum test results. Her spirometry shows no fixed airway obstruction and her  lung volumes as well as carboplatin effusion capacity are normal. She may be still having some silent laryngo-esophageal reflux despite barium swallow results. I instructed the patient to try Zantac in an effort to control this. I instructed the patient contact my office if she had any new breathing problems before next appointment.  1. Mild, Persistent Asthma: Doing Symbicort twice daily. She will attempt to decrease back to 2 inhalations once daily after 2-3 weeks if her cough and wheezing or significant improved. Continuing to monitor peak flows. Repeat spirometry with bronchodilator challenge at next appointment. 2. GERD: Continuing on AcipHex and adding Zantac 150 mg daily at bedtime to regimen. 3. Immunizations:  Received influenza vaccine in December 2016. 4. Follow-up: Patient to return to clinic in 2-3 months or sooner if needed.  Sonia Baller Ashok Cordia, M.D. [facility] Pulmonary & Critical Care Pager:  [phone] After 3pm or if no response, call [phone] 9:54 AM 04/26/2016

## 2016-04-28 DIAGNOSIS — F4323 Adjustment disorder with mixed anxiety and depressed mood: Secondary | ICD-10-CM | POA: Diagnosis not present

## 2016-04-29 DIAGNOSIS — F411 Generalized anxiety disorder: Secondary | ICD-10-CM | POA: Diagnosis not present

## 2016-05-02 ENCOUNTER — Encounter: Payer: Self-pay | Admitting: Pulmonary Disease

## 2016-05-05 DIAGNOSIS — F4323 Adjustment disorder with mixed anxiety and depressed mood: Secondary | ICD-10-CM | POA: Diagnosis not present

## 2016-05-12 DIAGNOSIS — F4323 Adjustment disorder with mixed anxiety and depressed mood: Secondary | ICD-10-CM | POA: Diagnosis not present

## 2016-05-23 ENCOUNTER — Ambulatory Visit (HOSPITAL_COMMUNITY): Payer: BLUE CROSS/BLUE SHIELD

## 2016-05-23 DIAGNOSIS — F4323 Adjustment disorder with mixed anxiety and depressed mood: Secondary | ICD-10-CM | POA: Diagnosis not present

## 2016-05-24 ENCOUNTER — Ambulatory Visit: Payer: BLUE CROSS/BLUE SHIELD | Admitting: Pulmonary Disease

## 2016-05-30 ENCOUNTER — Other Ambulatory Visit: Payer: Self-pay | Admitting: Pulmonary Disease

## 2016-05-30 DIAGNOSIS — F4323 Adjustment disorder with mixed anxiety and depressed mood: Secondary | ICD-10-CM | POA: Diagnosis not present

## 2016-06-26 ENCOUNTER — Other Ambulatory Visit: Payer: Self-pay | Admitting: Medical

## 2016-06-27 NOTE — Telephone Encounter (Signed)
Is this ok to refill?  

## 2016-07-13 ENCOUNTER — Ambulatory Visit: Payer: BLUE CROSS/BLUE SHIELD | Admitting: Medical

## 2016-09-01 ENCOUNTER — Other Ambulatory Visit: Payer: Self-pay | Admitting: Medical

## 2016-09-02 NOTE — Telephone Encounter (Signed)
Is this okay to refill? 

## 2016-11-24 ENCOUNTER — Encounter: Payer: Self-pay | Admitting: Medical

## 2016-11-24 ENCOUNTER — Ambulatory Visit (INDEPENDENT_AMBULATORY_CARE_PROVIDER_SITE_OTHER): Payer: BLUE CROSS/BLUE SHIELD | Admitting: Medical

## 2016-11-24 ENCOUNTER — Other Ambulatory Visit: Payer: Self-pay

## 2016-11-24 VITALS — BP 116/74 | HR 81 | Ht 67.0 in | Wt 141.2 lb

## 2016-11-24 DIAGNOSIS — D509 Iron deficiency anemia, unspecified: Secondary | ICD-10-CM | POA: Diagnosis not present

## 2016-11-24 DIAGNOSIS — Z0000 Encounter for general adult medical examination without abnormal findings: Secondary | ICD-10-CM | POA: Diagnosis not present

## 2016-11-24 DIAGNOSIS — J4530 Mild persistent asthma, uncomplicated: Secondary | ICD-10-CM | POA: Diagnosis not present

## 2016-11-24 DIAGNOSIS — Z23 Encounter for immunization: Secondary | ICD-10-CM | POA: Diagnosis not present

## 2016-11-24 DIAGNOSIS — E039 Hypothyroidism, unspecified: Secondary | ICD-10-CM

## 2016-11-24 DIAGNOSIS — E559 Vitamin D deficiency, unspecified: Secondary | ICD-10-CM | POA: Diagnosis not present

## 2016-11-24 DIAGNOSIS — K900 Celiac disease: Secondary | ICD-10-CM

## 2016-11-24 DIAGNOSIS — K219 Gastro-esophageal reflux disease without esophagitis: Secondary | ICD-10-CM

## 2016-11-24 DIAGNOSIS — G43709 Chronic migraine without aura, not intractable, without status migrainosus: Secondary | ICD-10-CM

## 2016-11-24 DIAGNOSIS — R8290 Unspecified abnormal findings in urine: Secondary | ICD-10-CM | POA: Diagnosis not present

## 2016-11-24 DIAGNOSIS — F4323 Adjustment disorder with mixed anxiety and depressed mood: Secondary | ICD-10-CM | POA: Insufficient documentation

## 2016-11-24 LAB — CBC WITH DIFFERENTIAL/PLATELET
BASOS PCT: 1 %
Basophils Absolute: 71 cells/uL (ref 0–200)
EOS ABS: 568 {cells}/uL — AB (ref 15–500)
EOS PCT: 8 %
HCT: 46.1 % — ABNORMAL HIGH (ref 35.0–45.0)
Hemoglobin: 15.3 g/dL (ref 11.7–15.5)
LYMPHS PCT: 17 %
Lymphs Abs: 1207 cells/uL (ref 850–3900)
MCH: 31.7 pg (ref 27.0–33.0)
MCHC: 33.2 g/dL (ref 32.0–36.0)
MCV: 95.4 fL (ref 80.0–100.0)
MONOS PCT: 6 %
MPV: 10.9 fL (ref 7.5–12.5)
Monocytes Absolute: 426 cells/uL (ref 200–950)
NEUTROS ABS: 4828 {cells}/uL (ref 1500–7800)
Neutrophils Relative %: 68 %
PLATELETS: 268 10*3/uL (ref 140–400)
RBC: 4.83 MIL/uL (ref 3.80–5.10)
RDW: 12.6 % (ref 11.0–15.0)
WBC: 7.1 10*3/uL (ref 4.0–10.5)

## 2016-11-24 LAB — POCT URINALYSIS DIPSTICK
BILIRUBIN UA: NEGATIVE
KETONES UA: NEGATIVE
LEUKOCYTES UA: NEGATIVE
Nitrite, UA: POSITIVE
PH UA: 6
PROTEIN UA: NEGATIVE
RBC UA: NEGATIVE
Urobilinogen, UA: 2

## 2016-11-24 LAB — BASIC METABOLIC PANEL
BUN: 9 mg/dL (ref 7–25)
CALCIUM: 10.1 mg/dL (ref 8.6–10.2)
CHLORIDE: 101 mmol/L (ref 98–110)
CO2: 22 mmol/L (ref 20–31)
CREATININE: 0.74 mg/dL (ref 0.50–1.10)
Glucose, Bld: 77 mg/dL (ref 65–99)
Potassium: 4.1 mmol/L (ref 3.5–5.3)
Sodium: 139 mmol/L (ref 135–146)

## 2016-11-24 LAB — IRON AND TIBC
%SAT: 29 % (ref 11–50)
Iron: 140 ug/dL (ref 40–190)
TIBC: 477 ug/dL — ABNORMAL HIGH (ref 250–450)
UIBC: 337 ug/dL (ref 125–400)

## 2016-11-24 LAB — TSH: TSH: 1.31 m[IU]/L

## 2016-11-24 LAB — T4, FREE: Free T4: 1.2 ng/dL (ref 0.8–1.8)

## 2016-11-24 MED ORDER — WELLBUTRIN XL 150 MG PO TB24: 150.0000 mg | ORAL_TABLET | Freq: Every day | ORAL | 1 refills | Status: DC

## 2016-11-24 NOTE — Progress Notes (Signed)
Subjective: Chief Complaint  Patient presents with  . physical    physical, lab work, discuss meds    Medical Team: Crisoforo Oxford, PA-C here for primary care Sees PT and chiropractor occasionally Has dentist, sees eye doctor Sees dermatology Dr. Kendall Flack, gynecology Pulmonology   Concerns: Asthma medications working well.  Has only had to use nebs once since last visit here, Symbicort fine, proai HFA doing fine, sometimes wheezing in allergy season.  GERD- takes Acidephx morning, zantac QHS  Wants specific labs: Ferritin, CBC, TSH, Vit D, B12, folate, Zinc  Would like to go back on Wellbutrin. Was on this in the past, and only the name brand worked.   Lately under stress, a little down in mood.  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   She notes being up to date on vaccines.   Has had recent Tdap, has had both 23 and 13 pneumococcal vaccines, all 3 HPV vaccines, Hep A and Hep B series.    Reviewed their medical, surgical, family, social, medication, and allergy history and updated chart as appropriate.  Past Medical History:  Diagnosis Date  . Allergy   . Anemia   . Asthma   . Celiac disease    +prior biopsy  . GERD (gastroesophageal reflux disease)   . Hypothyroidism   . Migraine headache without aura    gets bad nausea.  does well on Naratriptan and Relpax.  Failed Imitrex, Maxalt  . Mood change (HCC)    Wellbutrin, has used Zoloft prior  . Stress fracture    severeral prior  . Transfusion history    has had IV iron prior, Dr. Marin Olp prior management  . Wears contact lenses     Past Surgical History:  Procedure Laterality Date  . COLONOSCOPY  2015   [facility], advised to repeat q5 years; x 4 prior  . ESOPHAGOGASTRODUODENOSCOPY     x5 prior as of 2016  . INGUINAL HERNIA REPAIR     left  . NASAL SEPTUM SURGERY    . POLYPECTOMY     colon    Social History   Social History  . Marital status: Single    Spouse  name: N/A  . Number of children: N/A  . Years of education: N/A   Occupational History  . Not on file.   Social History Main Topics  . Smoking status: Never Smoker  . Smokeless tobacco: Never Used  . Alcohol use No     Comment: rare  . Drug use: No  . Sexual activity: Not on file   Other Topics Concern  . Not on file   Social History Narrative   Lives alone, works professor at [employer], now counseling at [employer],  Exercise - 6-7 days per week, teaches spin at [employer], swam competitively in college, marathon runner.      [facility]:   Originally from [place]. Has also lived in [place], [place], & [place]. Currently working in counseling. No pets currently. No bird exposure. No mold or hot tub exposure. Currently has carpet in the bedroom. No draperies. No exposure to asbestos.     Family History  Problem Relation Age of Onset  . Hypertension Mother   . Cancer Father     asbestos related lung cancer  . Heart disease Father 72  . Pulmonary embolism Father   . Celiac disease Brother   . Celiac disease Brother   . Stroke Maternal Grandmother   . COPD  Maternal Grandmother   . Heart disease Maternal Grandfather   . Stroke Maternal Grandfather   . Heart disease Paternal Grandfather     died 68yo  . Diabetes Neg Hx      Current Outpatient Prescriptions:  .  albuterol (PROVENTIL) (2.5 MG/3ML) 0.083% nebulizer solution, Take 3 mLs (2.5 mg total) by nebulization every 6 (six) hours as needed for wheezing or shortness of breath., Disp: 75 mL, Rfl: 3 .  Albuterol Sulfate (PROAIR RESPICLICK) 536 (90 BASE) MCG/ACT AEPB, Inhale 2 puffs into the lungs every 6 (six) hours as needed., Disp: 1 each, Rfl: 0 .  EPINEPHrine 0.3 mg/0.3 mL IJ SOAJ injection, Inject 0.3 mLs (0.3 mg total) into the muscle once., Disp: 1 Device, Rfl: 0 .  etonogestrel-ethinyl estradiol (NUVARING) 0.12-0.015 MG/24HR vaginal ring, Place 1 each vaginally every 28 (twenty-eight) days. Insert vaginally and  leave in place for 3 consecutive weeks, then remove for 1 week., Disp: , Rfl:  .  levothyroxine (SYNTHROID, LEVOTHROID) 50 MCG tablet, Take 1 tablet (50 mcg total) by mouth once., Disp: 90 tablet, Rfl: 3 .  naratriptan (AMERGE) 2.5 MG tablet, TAKE 1 TABLET AS NEEDED FOR MIGRAINE, MAY REPEAT AFTER 4 HOURS (MAX 5MG IN 24HRS), Disp: 30 tablet, Rfl: 1 .  PROAIR HFA 108 (90 Base) MCG/ACT inhaler, INHALE 2 PUFFS EVERY 4 HOURS, Disp: 8.5 Inhaler, Rfl: 2 .  RABEprazole (ACIPHEX) 20 MG tablet, TAKE 1 TABLET TWICE A DAY, Disp: 180 tablet, Rfl: 0 .  ranitidine (ZANTAC) 150 MG tablet, Take 1 tablet (150 mg total) by mouth at bedtime., Disp: 30 tablet, Rfl: 3 .  Spacer/Aero-Holding Chambers (AEROCHAMBER MV) inhaler, Use as instructed, Disp: 1 each, Rfl: 0 .  SYMBICORT 160-4.5 MCG/ACT inhaler, INHALE 2 PUFFS INTO THE LUNGS 2 (TWO) TIMES DAILY., Disp: 10.2 Inhaler, Rfl: 3 .  WELLBUTRIN XL 150 MG 24 hr tablet, Take 1 tablet (150 mg total) by mouth daily., Disp: 30 tablet, Rfl: 1  Allergies  Allergen Reactions  . Singulair [Montelukast Sodium]     Angry, irritable, out of skin feeling  . Sulfa Antibiotics     Review of Systems Constitutional: -fever, -chills, -sweats, -unexpected weight change, -decreased appetite, +fatigue Allergy: -sneezing, -itching, -congestion Dermatology: -changing moles, --rash, -lumps ENT: -runny nose, -ear pain, -sore throat, -hoarseness, -sinus pain, -teeth pain, - ringing in ears, -hearing loss, -nosebleeds Cardiology: -chest pain, -palpitations, -swelling, -difficulty breathing when lying flat, -waking up short of breath Respiratory: -cough, -shortness of breath, -difficulty breathing with exercise or exertion, -wheezing, -coughing up blood Gastroenterology: -abdominal pain, -nausea, -vomiting, -diarrhea, -constipation, -blood in stool, -changes in bowel movement, -difficulty swallowing or eating Hematology: -bleeding, -bruising  Musculoskeletal: -joint aches, -muscle aches,  -joint swelling, -back pain, -neck pain, -cramping, -changes in gait Ophthalmology: denies vision changes, eye redness, itching, discharge Urology: -burning with urination, -difficulty urinating, -blood in urine, -urinary frequency, -urgency, -incontinence Neurology: -headache, -weakness, -tingling, -numbness, -memory loss, -falls, -dizziness Psychology: +depressed mood, -agitation, +sleep problems     Objective:   Physical Exam  BP 116/74   Pulse 81   Ht 5' 7"  (1.702 m)   Wt 141 lb 3.2 oz (64 kg)   SpO2 98%   BMI 22.12 kg/m   General appearance: alert, no distress, WD/WN, lean white Skin: few scattered benign appearing macules, few biopsy scars of bilat low back, right upper arm biopsy scar, no worrisome lesions HEENT: normocephalic, conjunctiva/corneas normal, sclerae anicteric, PERRLA, EOMi, nares patent, no discharge or erythema, pharynx normal Oral cavity: MMM, tongue normal,  teeth normal Neck: supple, no lymphadenopathy, no thyromegaly, no masses, normal ROM, no bruits Chest: non tender, normal shape and expansion Heart: RRR, normal S1, S2, no murmurs Lungs: CTA bilaterally, no wheezes, rhonchi, or rales Abdomen: +bs, soft, non tender, non distended, no masses, no hepatomegaly, no splenomegaly, no bruits Back: non tender, normal ROM, no scoliosis Musculoskeletal: upper extremities non tender, no obvious deformity, normal ROM throughout, lower extremities non tender, no obvious deformity, normal ROM throughout Extremities: no edema, no cyanosis, no clubbing Pulses: 2+ symmetric, upper and lower extremities, normal cap refill Neurological: alert, oriented x 3, CN2-12 intact, strength normal upper extremities and lower extremities, sensation normal throughout, DTRs 2+ throughout, no cerebellar signs, gait normal Psychiatric: normal affect, behavior normal, pleasant  Breast/gyn/rectal - deferred to gynecology   Assessment and Plan :    Encounter Diagnoses  Name Primary?  .  Encounter for health maintenance examination in adult Yes  . Chronic migraine without aura without status migrainosus, not intractable   . Mild persistent asthma without complication   . Gastroesophageal reflux disease, esophagitis presence not specified   . Celiac disease   . Vitamin D deficiency   . Needs flu shot   . Hypothyroidism, unspecified type   . Iron deficiency anemia, unspecified iron deficiency anemia type   . Urine findings abnormal   . Adjustment disorder with mixed anxiety and depressed mood      Physical exam - discussed healthy lifestyle, diet, exercise, preventative care, vaccinations, and addressed their concerns.  Handout given. See your eye doctor yearly for routine vision care. See your dentist yearly for routine dental care including hygiene visits twice yearly. See your gynecologist yearly for routine gynecological care. Labs today C/t current medications C/t yearly flu shot Stable on current medication Depressed mood, anxiety - begin trial of Wellbutrin. Discussed her concerns, f/u pending labs.    Barbara Odonnell was seen today for physical.  Diagnoses and all orders for this visit:  Encounter for health maintenance examination in adult -     Urinalysis Dipstick -     Basic metabolic panel -     CBC with Differential/Platelet -     TSH -     Iron and TIBC -     Ferritin -     Folate -     Vitamin B12 -     T4, free -     VITAMIN D 25 Hydroxy (Vit-D Deficiency, Fractures) -     Zinc  Chronic migraine without aura without status migrainosus, not intractable -     Zinc  Mild persistent asthma without complication  Gastroesophageal reflux disease, esophagitis presence not specified  Celiac disease  Vitamin D deficiency -     VITAMIN D 25 Hydroxy (Vit-D Deficiency, Fractures)  Needs flu shot -     Flu Vaccine QUAD 36+ mos IM  Hypothyroidism, unspecified type -     TSH -     T4, free -     Zinc  Iron deficiency anemia, unspecified iron deficiency  anemia type -     CBC with Differential/Platelet -     Iron and TIBC -     Ferritin -     Folate -     Vitamin B12 -     Zinc  Urine findings abnormal -     Urine culture  Adjustment disorder with mixed anxiety and depressed mood  Other orders -     WELLBUTRIN XL 150 MG 24 hr tablet; Take 1 tablet (  150 mg total) by mouth daily.

## 2016-11-25 ENCOUNTER — Other Ambulatory Visit: Payer: Self-pay | Admitting: Medical

## 2016-11-25 ENCOUNTER — Encounter: Payer: Self-pay | Admitting: Medical

## 2016-11-25 ENCOUNTER — Telehealth: Payer: Self-pay | Admitting: Medical

## 2016-11-25 LAB — FOLATE: FOLATE: 14.9 ng/mL (ref >5.4)

## 2016-11-25 LAB — FERRITIN: Ferritin: 15 ng/mL (ref 10–232)

## 2016-11-25 LAB — VITAMIN B12: VITAMIN B 12: 678 pg/mL (ref 200–1100)

## 2016-11-25 LAB — VITAMIN D 25 HYDROXY (VIT D DEFICIENCY, FRACTURES): VIT D 25 HYDROXY: 38 ng/mL (ref 30–100)

## 2016-11-25 NOTE — Telephone Encounter (Signed)
Is this okay to refill? 

## 2016-11-25 NOTE — Telephone Encounter (Signed)
Barbara Odonnell - will you work on her prior auth for Wellbutrin XL 150:  Per patient message:  [facility] of Wellbutrin XL 150 without a prior authorization.  It will cost $1,500 cash for name brand if not approved.  Are you able to call [employer] and tell them the reason for the name brand request.  I took the generic in the past and felt awful on it.  The extended release does not work the same and it felt like all of the medicine hit me at once.  [employer] main number is [phone].  The prior review/certification number is [id number].

## 2016-11-26 LAB — URINE CULTURE

## 2016-11-29 ENCOUNTER — Other Ambulatory Visit: Payer: Self-pay | Admitting: Medical

## 2016-11-29 LAB — ZINC: ZINC: 85 ug/dL (ref 60–130)

## 2016-11-29 MED ORDER — ALBUTEROL SULFATE 108 (90 BASE) MCG/ACT IN AEPB: 2.0000 | INHALATION_SPRAY | Freq: Four times a day (QID) | RESPIRATORY_TRACT | 0 refills | Status: DC | PRN

## 2016-11-29 MED ORDER — RABEPRAZOLE SODIUM 20 MG PO TBEC: 20.0000 mg | DELAYED_RELEASE_TABLET | Freq: Two times a day (BID) | ORAL | 3 refills | Status: DC

## 2016-11-29 MED ORDER — NITROFURANTOIN MONOHYD MACRO 100 MG PO CAPS: 100.0000 mg | ORAL_CAPSULE | Freq: Two times a day (BID) | ORAL | 0 refills | Status: DC

## 2016-11-29 MED ORDER — FERREX 28 PO MISC: 1.0000 | Freq: Every day | ORAL | 5 refills | Status: DC

## 2016-11-29 MED ORDER — BUDESONIDE-FORMOTEROL FUMARATE 160-4.5 MCG/ACT IN AERO: INHALATION_SPRAY | RESPIRATORY_TRACT | 11 refills | Status: DC

## 2016-11-29 MED ORDER — VITAMIN D (ERGOCALCIFEROL) 1.25 MG (50000 UNIT) PO CAPS: 50000.0000 [IU] | ORAL_CAPSULE | ORAL | 3 refills | Status: DC

## 2016-11-30 ENCOUNTER — Telehealth: Payer: Self-pay | Admitting: Medical

## 2016-11-30 NOTE — Telephone Encounter (Signed)
Called pt for medication history and she states she has been on Zoloft, Generic Wellbutrin, Bupropion XL, Lexapro, Celexa, Paxil.   P.A. WELLBUTRIN XL completed online

## 2016-11-30 NOTE — Telephone Encounter (Signed)
See 11/30/16 telephone call

## 2016-12-06 ENCOUNTER — Other Ambulatory Visit: Payer: Self-pay | Admitting: Medical

## 2016-12-06 MED ORDER — BUPROPION HCL 100 MG PO TABS: 100.0000 mg | ORAL_TABLET | Freq: Two times a day (BID) | ORAL | 1 refills | Status: DC

## 2016-12-06 NOTE — Telephone Encounter (Signed)
Pt called and she couldn't afford the the brand name Wellbutrin xl   It's was 1500 a month ,  Called in the genic  [facility]l [facility], and pt was noitfied of this.

## 2016-12-06 NOTE — Telephone Encounter (Signed)
I sent generic Bupropion 178m BID as alternate to pharmacy.   She can also call insurer about Buproprion ER 1541mregarding coverage.

## 2016-12-10 NOTE — Telephone Encounter (Signed)
I have sent the cheaper generic.  In 3-4 weeks if she doesn't feel this is effective, then lets try a different dose or different medication

## 2016-12-12 ENCOUNTER — Encounter: Payer: Self-pay | Admitting: Medical

## 2016-12-14 ENCOUNTER — Telehealth: Payer: Self-pay | Admitting: Medical

## 2016-12-14 NOTE — Telephone Encounter (Signed)
Approved per Cover my meds, called pharmacy & pt already picked up generic 2/7 so can't rerun brand until 12/29/16

## 2016-12-14 NOTE — Telephone Encounter (Signed)
Please help.   I'm confused on status of her Wellbutrin.   We have went back and forth, but the last think I sent was generic Wellbutrin 135m BID.  Check with pharmacist, but did she get the generic 1024mBID, or did she pay the high cost of the XL version?  I assume she is NOT planning to continue paying a really high price for the name brand, so we may ask pharmacist what similar medication they recommend as option

## 2016-12-15 NOTE — Telephone Encounter (Signed)
Called pharmacist and verified pt picked up Bupropion XL 150 qd on 12/07/16

## 2017-01-04 ENCOUNTER — Other Ambulatory Visit: Payer: Self-pay | Admitting: Medical

## 2017-01-05 ENCOUNTER — Telehealth: Payer: Self-pay | Admitting: Medical

## 2017-01-05 NOTE — Telephone Encounter (Signed)
Called pharmacy and had them run the brand Wellbutrin and it went thru for $1259, a savings of $250.  Called pt & left message

## 2017-02-23 DIAGNOSIS — N390 Urinary tract infection, site not specified: Secondary | ICD-10-CM | POA: Diagnosis not present

## 2017-02-23 DIAGNOSIS — Z3040 Encounter for surveillance of contraceptives, unspecified: Secondary | ICD-10-CM | POA: Diagnosis not present

## 2017-02-23 DIAGNOSIS — R350 Frequency of micturition: Secondary | ICD-10-CM | POA: Diagnosis not present

## 2017-02-23 DIAGNOSIS — N898 Other specified noninflammatory disorders of vagina: Secondary | ICD-10-CM | POA: Diagnosis not present

## 2017-03-22 DIAGNOSIS — R350 Frequency of micturition: Secondary | ICD-10-CM | POA: Diagnosis not present

## 2017-04-17 ENCOUNTER — Other Ambulatory Visit: Payer: Self-pay | Admitting: Medical

## 2017-04-17 NOTE — Telephone Encounter (Signed)
Refill but lets get her in for f/u on this medication.

## 2017-04-17 NOTE — Telephone Encounter (Signed)
Can she have a refill on this

## 2017-05-05 DIAGNOSIS — F411 Generalized anxiety disorder: Secondary | ICD-10-CM | POA: Diagnosis not present

## 2017-05-05 DIAGNOSIS — H2513 Age-related nuclear cataract, bilateral: Secondary | ICD-10-CM | POA: Diagnosis not present

## 2017-05-26 DIAGNOSIS — R102 Pelvic and perineal pain: Secondary | ICD-10-CM | POA: Diagnosis not present

## 2017-05-26 DIAGNOSIS — R350 Frequency of micturition: Secondary | ICD-10-CM | POA: Diagnosis not present

## 2017-05-29 DIAGNOSIS — Z1231 Encounter for screening mammogram for malignant neoplasm of breast: Secondary | ICD-10-CM | POA: Diagnosis not present

## 2017-05-29 DIAGNOSIS — N3281 Overactive bladder: Secondary | ICD-10-CM | POA: Diagnosis not present

## 2017-05-29 DIAGNOSIS — Z01419 Encounter for gynecological examination (general) (routine) without abnormal findings: Secondary | ICD-10-CM | POA: Diagnosis not present

## 2017-05-29 DIAGNOSIS — Z113 Encounter for screening for infections with a predominantly sexual mode of transmission: Secondary | ICD-10-CM | POA: Diagnosis not present

## 2017-06-11 ENCOUNTER — Other Ambulatory Visit: Payer: Self-pay | Admitting: Medical

## 2017-06-12 ENCOUNTER — Other Ambulatory Visit: Payer: Self-pay | Admitting: Medical

## 2017-06-12 NOTE — Telephone Encounter (Signed)
Is this okay to refill? 

## 2017-07-16 ENCOUNTER — Other Ambulatory Visit: Payer: Self-pay | Admitting: Medical

## 2017-07-17 NOTE — Telephone Encounter (Signed)
Can pt have a refill

## 2017-08-25 ENCOUNTER — Other Ambulatory Visit: Payer: Self-pay | Admitting: Medical

## 2017-08-25 NOTE — Telephone Encounter (Signed)
Refill and get in for med check

## 2017-08-25 NOTE — Telephone Encounter (Signed)
Can she have a refill on med

## 2017-08-25 NOTE — Telephone Encounter (Signed)
yours

## 2017-08-28 NOTE — Progress Notes (Signed)
Pt received flu shot to LT deltoid.   Lot # [id number] Mfg: [employer] NDC: [phone] Expires 04/29/18 Consent obtained and copy given to patient

## 2017-09-19 ENCOUNTER — Ambulatory Visit: Payer: BLUE CROSS/BLUE SHIELD | Admitting: Medical

## 2017-09-25 ENCOUNTER — Telehealth: Payer: Self-pay | Admitting: Family Medicine

## 2017-09-25 NOTE — Telephone Encounter (Signed)
Refill and make sure she has physical appt for 10/2017

## 2017-09-25 NOTE — Telephone Encounter (Signed)
Bupropion HCL XL 150 mg refill req from [facility] [address]

## 2017-09-25 NOTE — Telephone Encounter (Signed)
Can pt have a refill on meds  

## 2017-09-26 NOTE — Telephone Encounter (Signed)
Pt has an appt.

## 2017-09-27 ENCOUNTER — Other Ambulatory Visit: Payer: Self-pay | Admitting: Medical

## 2017-09-28 ENCOUNTER — Other Ambulatory Visit: Payer: Self-pay | Admitting: Medical

## 2017-10-26 ENCOUNTER — Other Ambulatory Visit: Payer: Self-pay | Admitting: Medical

## 2017-10-26 NOTE — Telephone Encounter (Signed)
Can pt have a refill on meds  

## 2017-11-28 ENCOUNTER — Encounter: Payer: Self-pay | Admitting: Medical

## 2017-11-28 ENCOUNTER — Ambulatory Visit: Payer: BLUE CROSS/BLUE SHIELD | Admitting: Medical

## 2017-11-28 VITALS — BP 120/70 | HR 74 | Ht 67.0 in | Wt 147.6 lb

## 2017-11-28 DIAGNOSIS — F4323 Adjustment disorder with mixed anxiety and depressed mood: Secondary | ICD-10-CM

## 2017-11-28 DIAGNOSIS — E559 Vitamin D deficiency, unspecified: Secondary | ICD-10-CM

## 2017-11-28 DIAGNOSIS — R002 Palpitations: Secondary | ICD-10-CM | POA: Diagnosis not present

## 2017-11-28 DIAGNOSIS — Z8781 Personal history of (healed) traumatic fracture: Secondary | ICD-10-CM | POA: Diagnosis not present

## 2017-11-28 DIAGNOSIS — E039 Hypothyroidism, unspecified: Secondary | ICD-10-CM | POA: Diagnosis not present

## 2017-11-28 DIAGNOSIS — Z8262 Family history of osteoporosis: Secondary | ICD-10-CM | POA: Insufficient documentation

## 2017-11-28 DIAGNOSIS — K219 Gastro-esophageal reflux disease without esophagitis: Secondary | ICD-10-CM

## 2017-11-28 DIAGNOSIS — K900 Celiac disease: Secondary | ICD-10-CM

## 2017-11-28 DIAGNOSIS — Z0000 Encounter for general adult medical examination without abnormal findings: Secondary | ICD-10-CM

## 2017-11-28 DIAGNOSIS — J4530 Mild persistent asthma, uncomplicated: Secondary | ICD-10-CM

## 2017-11-28 DIAGNOSIS — D509 Iron deficiency anemia, unspecified: Secondary | ICD-10-CM | POA: Diagnosis not present

## 2017-11-28 DIAGNOSIS — G43709 Chronic migraine without aura, not intractable, without status migrainosus: Secondary | ICD-10-CM

## 2017-11-28 DIAGNOSIS — Z87312 Personal history of (healed) stress fracture: Secondary | ICD-10-CM | POA: Insufficient documentation

## 2017-11-28 LAB — POCT URINALYSIS DIP (PROADVANTAGE DEVICE)
Bilirubin, UA: NEGATIVE
Blood, UA: NEGATIVE
GLUCOSE UA: NEGATIVE mg/dL
Ketones, POC UA: NEGATIVE mg/dL
LEUKOCYTES UA: NEGATIVE
Nitrite, UA: NEGATIVE
Protein Ur, POC: NEGATIVE mg/dL
SPECIFIC GRAVITY, URINE: 1.03
Urobilinogen, Ur: NEGATIVE
pH, UA: 6 (ref 5.0–8.0)

## 2017-11-28 NOTE — Progress Notes (Signed)
Subjective: Chief Complaint  Patient presents with  . fasting cpe    fasitng cpe, no other concerns, would like a EKG done due to issues she was having like a month ago. no pap smear   Medical team: Dr. Leo Grosser, OB/Gyn  Makari Sanko, Camelia Eng, PA-C here for primary care Urology Dr. Diona Fanti  Here for physical.  Sees gyn, Up to date on mammogram and pap smear.  Just started 2 new jobs.  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  Is also a Engineer, maintenance for [employer].   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   Has hx/o borderline osteopenia, last bone density test borderline 5 years ago.  Has had 3 prior fractures as an adult .  Has had stress fractures of right and left tibias in the past, hx/o right hip fracture.   Mom and MGM had osteoporosis.  Takes Vit D 50,000 every other week.   Has hx/o exercise stress test several years ago, maybe 2014 in [place].  Requests EKG today.   Has had palpitations from time to time.  Vaccine history: Just had flu shot at [facility] 08/2017. Has had both pneumonia vaccines. Spring 2015, and one maybe in 2012.   Last Tdap was probably summer 2015.   Asthma - fine for months until last 2 weeks.   But hasn't had any big issues in a while.  Not having to use albuterol much.   Takes the Symbicort regularly, daily.   Full PFT study 2017 showed no appreciates post neb improvement.    Reviewed their medical, surgical, family, social, medication, and allergy history and updated chart as appropriate.  Past Medical History:  Diagnosis Date  . Allergy   . Anemia    IV iron in past, prior to 2018  . Asthma   . Celiac disease    +prior biopsy  . GERD (gastroesophageal reflux disease)   . Hip fracture [facility]) 2008   right, Dr. Oneida Alar  . History of stress test [address]  . Hypothyroidism   . Migraine headache without aura    gets bad nausea.  does well on Naratriptan and Relpax.  Failed Imitrex,  Maxalt  . Mood change    Wellbutrin, has used Zoloft prior  . Stress fracture    severeral prior  . Transfusion history    has had IV iron prior, Dr. Marin Olp prior management  . Wears contact lenses     Past Surgical History:  Procedure Laterality Date  . COLONOSCOPY  2015   [facility], advised to repeat q5 years; x 4 prior  . ESOPHAGOGASTRODUODENOSCOPY     x5 prior as of 2016  . INGUINAL HERNIA REPAIR     left  . NASAL SEPTUM SURGERY    . POLYPECTOMY     sigmoid colon    Social History   Socioeconomic History  . Marital status: Single    Spouse name: Not on file  . Number of children: Not on file  . Years of education: Not on file  . Highest education level: Not on file  Social Needs  . Financial resource strain: Not on file  . Food insecurity - worry: Not on file  . Food insecurity - inability: Not on file  . Transportation needs - medical: Not on file  . Transportation needs - non-medical: Not on file  Occupational History  . Not on file  Tobacco Use  . Smoking  status: Never Smoker  . Smokeless tobacco: Never Used  Substance and Sexual Activity  . Alcohol use: No    Alcohol/week: 0.6 oz    Types: 1 Glasses of wine per week    Comment: rare  . Drug use: No  . Sexual activity: Not on file  Other Topics Concern  . Not on file  Social History Narrative   Lives alone, works professor at [employer], now counseling at [employer],  Exercise - 6-7 days per week, teaches spin at [employer], swam competitively in college, marathon runner.      [facility]:   Originally from [place]. Has also lived in [place], [place], & [place]. Currently working in counseling. No pets currently. No bird exposure. No mold or hot tub exposure. Currently has carpet in the bedroom. No draperies. No exposure to asbestos.     Family History  Problem Relation Age of Onset  . Hypertension Mother   . Osteopenia Mother   . Cancer Father        asbestos related lung cancer  . Heart  disease Father 76  . Pulmonary embolism Father   . Celiac disease Brother   . Celiac disease Brother   . Stroke Maternal Grandmother   . COPD Maternal Grandmother   . Osteopenia Maternal Grandmother   . Heart disease Maternal Grandfather   . Stroke Maternal Grandfather   . Heart disease Paternal Grandfather        died 57yo  . Diabetes Neg Hx      Current Outpatient Medications:  .  albuterol (PROAIR HFA) 108 (90 Base) MCG/ACT inhaler, NHALE 2 PUFFS EVERY 4 HOURS, Disp: 8.5 Inhaler, Rfl: 0 .  albuterol (PROVENTIL) (2.5 MG/3ML) 0.083% nebulizer solution, Take 3 mLs (2.5 mg total) by nebulization every 6 (six) hours as needed for wheezing or shortness of breath., Disp: 75 mL, Rfl: 3 .  budesonide-formoterol (SYMBICORT) 160-4.5 MCG/ACT inhaler, INHALE 2 PUFFS INTO THE LUNGS 2 (TWO) TIMES DAILY., Disp: 10.2 Inhaler, Rfl: 0 .  buPROPion (WELLBUTRIN XL) 150 MG 24 hr tablet, TAKE 1 TABLET EVERY DAY, Disp: 30 tablet, Rfl: 0 .  EPINEPHrine 0.3 mg/0.3 mL IJ SOAJ injection, Inject 0.3 mLs (0.3 mg total) into the muscle once., Disp: 1 Device, Rfl: 0 .  etonogestrel-ethinyl estradiol (NUVARING) 0.12-0.015 MG/24HR vaginal ring, Place 1 each vaginally every 28 (twenty-eight) days. Insert vaginally and leave in place for 3 consecutive weeks, then remove for 1 week., Disp: , Rfl:  .  FeAspGl-FeFum-B12-FA-C-Succ Ac (FERREX 28) MISC, TAKE 1 TABLET BY MOUTH DAILY., Disp: 28 each, Rfl: 2 .  naratriptan (AMERGE) 2.5 MG tablet, TAKE 1 TABLET AS NEEDED FOR MIGRAINE, MAY REPEAT AFTER 4 HOURS (MAX 5MG IN 24HRS), Disp: 30 tablet, Rfl: 0 .  nitrofurantoin, macrocrystal-monohydrate, (MACROBID) 100 MG capsule, Take 1 capsule (100 mg total) by mouth 2 (two) times daily., Disp: 14 capsule, Rfl: 0 .  RABEprazole (ACIPHEX) 20 MG tablet, Take 1 tablet (20 mg total) by mouth 2 (two) times daily., Disp: 180 tablet, Rfl: 3 .  ranitidine (ZANTAC) 150 MG tablet, Take 1 tablet (150 mg total) by mouth at bedtime., Disp: 30 tablet,  Rfl: 3 .  Spacer/Aero-Holding Chambers (AEROCHAMBER MV) inhaler, Use as instructed, Disp: 1 each, Rfl: 0 .  SYNTHROID 50 MCG tablet, TAKE 1 TABLET (50 MCG TOTAL) BY MOUTH ONCE., Disp: 90 tablet, Rfl: 2 .  Vitamin D, Ergocalciferol, (DRISDOL) 50000 units CAPS capsule, Take 1 capsule (50,000 Units total) by mouth every 7 (seven) days., Disp:  12 capsule, Rfl: 3  Allergies  Allergen Reactions  . Singulair [Montelukast Sodium]     Angry, irritable, out of skin feeling  . Sulfa Antibiotics      Review of Systems Constitutional: -fever, -chills, -sweats, -unexpected weight change, -decreased appetite, -fatigue Allergy: -sneezing, -itching, -congestion Dermatology: -changing moles, --rash, -lumps ENT: -runny nose, -ear pain, -sore throat, -hoarseness, -sinus pain, -teeth pain, - ringing in ears, -hearing loss, -nosebleeds Cardiology: -chest pain, +intermittent palpitations, -swelling, -difficulty breathing when lying flat, -waking up short of breath Respiratory: -cough, -shortness of breath, -difficulty breathing with exercise or exertion, -wheezing, -coughing up blood Gastroenterology: -abdominal pain, -nausea, -vomiting, -diarrhea, -constipation, -blood in stool, -changes in bowel movement, -difficulty swallowing or eating Hematology: -bleeding, -bruising  Musculoskeletal: -joint aches, -muscle aches, -joint swelling, -back pain, -neck pain, -cramping, -changes in gait Ophthalmology: denies vision changes, eye redness, itching, discharge Urology: -burning with urination, -difficulty urinating, -blood in urine, -urinary frequency, -urgency, -incontinence Neurology: -headache, -weakness, -tingling, -numbness, -memory loss, -falls, -dizziness Psychology: -depressed mood, -agitation, -sleep problems Breast/gyn: -breast tenderness, -discharge, -lumps, -vaginal discharge,- irregular periods, -heavy periods      Objective:   BP 120/70   Pulse 74   Ht 5' 7"  (1.702 m)   Wt 147 lb 9.6 oz (67 kg)    LMP 11/20/2017   BMI 23.12 kg/m   General appearance: alert, no distress, WD/WN, Caucasian female Skin: scattered macules, no worrisome lesions HEENT: normocephalic, conjunctiva/corneas normal, sclerae anicteric, PERRLA, EOMi, nares patent, no discharge or erythema, pharynx normal Oral cavity: MMM, tongue normal, teeth normal Neck: supple, no lymphadenopathy, no thyromegaly, no masses, normal ROM, no bruits Chest: non tender, normal shape and expansion Heart: RRR, normal S1, S2, no murmurs Lungs: CTA bilaterally, no wheezes, rhonchi, or rales Abdomen: +bs, soft, non tender, non distended, no masses, no hepatomegaly, no splenomegaly, no bruits Back: non tender, normal ROM, no scoliosis Musculoskeletal: upper extremities non tender, no obvious deformity, normal ROM throughout, lower extremities non tender, no obvious deformity, normal ROM throughout Extremities: no edema, no cyanosis, no clubbing Pulses: 2+ symmetric, upper and lower extremities, normal cap refill Neurological: alert, oriented x 3, CN2-12 intact, strength normal upper extremities and lower extremities, sensation normal throughout, DTRs 2+ throughout, no cerebellar signs, gait normal Psychiatric: normal affect, behavior normal, pleasant  Breast/gyn/rectal - deferred to gynecology    Adult ECG Report  Indication: physical, palpitations  Rate: 69 bpm  Rhythm: normal sinus rhythm  QRS Axis: 70 degrees  PR Interval: 181m  QRS Duration: 712m QTc: 43988mConduction Disturbances: none  Other Abnormalities: atrial enlargement  Patient's cardiac risk factors are: family history of premature cardiovascular disease.  EKG comparison: 2016  Narrative Interpretation: possible atrial enlargement copmared to 2016 EKG    Assessment and Plan :    Encounter Diagnoses  Name Primary?  . Encounter for health maintenance examination in adult Yes  . Vitamin D deficiency   . Chronic migraine without aura without status  migrainosus, not intractable   . Mild persistent asthma without complication   . Gastroesophageal reflux disease, esophagitis presence not specified   . Celiac disease   . Hypothyroidism, unspecified type   . Adjustment disorder with mixed anxiety and depressed mood   . Iron deficiency anemia, unspecified iron deficiency anemia type   . Palpitations   . Family history of osteoporosis   . History of fracture     Physical exam - discussed and counseled on healthy lifestyle, diet, exercise, preventative care, vaccinations, sick and  well care, proper use of emergency dept and after hours care, and addressed their concerns.    Health screening: Bone density - recommended bone density evaluation due to risk factors thyroid disease, family hx/o osteoporosis, personal hx/o fracture See your eye doctor yearly for routine vision care. See your dentist yearly for routine dental care including hygiene visits twice yearly. See your gynecologist yearly for routine gynecological care.  Cancer screening Mammo and pap through gynecology Discussed colonoscopy repeat 2020 Routine labs today  Vaccinations: Counseled on the following vaccines:  Yearly flu shot And will document her reported prior vaccines.  Encounter Diagnoses  Name Primary?  . Encounter for health maintenance examination in adult Yes  . Vitamin D deficiency   . Chronic migraine without aura without status migrainosus, not intractable   . Mild persistent asthma without complication   . Gastroesophageal reflux disease, esophagitis presence not specified   . Celiac disease   . Hypothyroidism, unspecified type   . Adjustment disorder with mixed anxiety and depressed mood   . Iron deficiency anemia, unspecified iron deficiency anemia type   . Palpitations   . Family history of osteoporosis   . History of fracture     Separate significant chronic issues discussed: Asthma - doing fine on Symbicort regularly, discussed acute care  for flare ups, sick visits  Mood - doing fine, c/t Wellbutrin.  Vitamin D deficiency-continue vitamin D regularly, counseled on diet and sun exposure  Hypothyroidism -labs today  History of iron deficiency anemia-labs today  Occasional palpitation, abnormal EKG- we will try and request the 2014 stress test results from [facility], we will go ahead and pursue echocardiogram  She will call and schedule bone density scan given her risk factors including lean white female, family history of osteoporosis, vitamin D deficiency, hypothyroidism, celiac disease   Chronic GERD-continue same medication, discussed risk and benefits of medication    Shannyn was seen today for fasting cpe.  Diagnoses and all orders for this visit:  Encounter for health maintenance examination in adult -     POCT Urinalysis DIP (Proadvantage Device) -     TSH -     T4, free -     Iron and TIBC -     Ferritin -     Parathyroid hormone, intact (no Ca) -     EKG 12-Lead -     CBC -     Cancel: Lipid panel -     Lipid panel -     VITAMIN D 25 Hydroxy (Vit-D Deficiency, Fractures) -     ECHOCARDIOGRAM COMPLETE; Future  Vitamin D deficiency -     DG Bone Density; Future -     VITAMIN D 25 Hydroxy (Vit-D Deficiency, Fractures)  Chronic migraine without aura without status migrainosus, not intractable  Mild persistent asthma without complication  Gastroesophageal reflux disease, esophagitis presence not specified -     DG Bone Density; Future  Celiac disease -     DG Bone Density; Future  Hypothyroidism, unspecified type -     TSH -     T4, free -     Parathyroid hormone, intact (no Ca) -     EKG 12-Lead -     DG Bone Density; Future  Adjustment disorder with mixed anxiety and depressed mood  Iron deficiency anemia, unspecified iron deficiency anemia type -     Iron and TIBC -     Ferritin -     DG Bone Density; Future  Palpitations -     EKG 12-Lead -     ECHOCARDIOGRAM  COMPLETE; Future  Family history of osteoporosis -     DG Bone Density; Future  History of fracture -     DG Bone Density; Future   Follow-up pending labs, yearly for physical

## 2017-11-29 ENCOUNTER — Encounter: Payer: Self-pay | Admitting: Medical

## 2017-11-29 ENCOUNTER — Telehealth: Payer: Self-pay | Admitting: Medical

## 2017-11-29 ENCOUNTER — Other Ambulatory Visit: Payer: Self-pay | Admitting: Medical

## 2017-11-29 LAB — CBC
HEMATOCRIT: 44.7 % (ref 34.0–46.6)
HEMOGLOBIN: 15.2 g/dL (ref 11.1–15.9)
MCH: 32.3 pg (ref 26.6–33.0)
MCHC: 34 g/dL (ref 31.5–35.7)
MCV: 95 fL (ref 79–97)
Platelets: 279 10*3/uL (ref 150–379)
RBC: 4.7 x10E6/uL (ref 3.77–5.28)
RDW: 13.3 % (ref 12.3–15.4)
WBC: 5.2 10*3/uL (ref 3.4–10.8)

## 2017-11-29 LAB — IRON AND TIBC
IRON: 112 ug/dL (ref 27–159)
Iron Saturation: 28 % (ref 15–55)
TIBC: 405 ug/dL (ref 250–450)
UIBC: 293 ug/dL (ref 131–425)

## 2017-11-29 LAB — LIPID PANEL
CHOLESTEROL TOTAL: 210 mg/dL — AB (ref 100–199)
Chol/HDL Ratio: 2.6 ratio (ref 0.0–4.4)
HDL: 82 mg/dL (ref >39)
LDL CALC: 116 mg/dL — AB (ref 0–99)
TRIGLYCERIDES: 62 mg/dL (ref 0–149)
VLDL Cholesterol Cal: 12 mg/dL (ref 5–40)

## 2017-11-29 LAB — SPECIMEN STATUS REPORT

## 2017-11-29 LAB — TSH: TSH: 1.61 u[IU]/mL (ref 0.450–4.500)

## 2017-11-29 LAB — T4, FREE: FREE T4: 1.33 ng/dL (ref 0.82–1.77)

## 2017-11-29 LAB — PARATHYROID HORMONE, INTACT (NO CA): PTH: 64 pg/mL (ref 15–65)

## 2017-11-29 LAB — FERRITIN: Ferritin: 19 ng/mL (ref 15–150)

## 2017-11-29 LAB — VITAMIN D 25 HYDROXY (VIT D DEFICIENCY, FRACTURES): VIT D 25 HYDROXY: 38.1 ng/mL (ref 30.0–100.0)

## 2017-11-29 MED ORDER — BUDESONIDE-FORMOTEROL FUMARATE 160-4.5 MCG/ACT IN AERO: INHALATION_SPRAY | RESPIRATORY_TRACT | 11 refills | Status: DC

## 2017-11-29 MED ORDER — BUPROPION HCL ER (XL) 150 MG PO TB24: 150.0000 mg | ORAL_TABLET | Freq: Every day | ORAL | 3 refills | Status: DC

## 2017-11-29 MED ORDER — ALBUTEROL SULFATE HFA 108 (90 BASE) MCG/ACT IN AERS: INHALATION_SPRAY | RESPIRATORY_TRACT | 1 refills | Status: DC

## 2017-11-29 MED ORDER — EPINEPHRINE 0.3 MG/0.3ML IJ SOAJ
0.3000 mg | Freq: Once | INTRAMUSCULAR | 0 refills | Status: AC
Start: 2017-11-29 — End: 2017-11-29

## 2017-11-29 MED ORDER — FERREX 28 PO MISC: 1.0000 | Freq: Every day | ORAL | 1 refills | Status: DC

## 2017-11-29 MED ORDER — NARATRIPTAN HCL 2.5 MG PO TABS: ORAL_TABLET | ORAL | 2 refills | Status: DC

## 2017-11-29 MED ORDER — SYNTHROID 50 MCG PO TABS: 50.0000 ug | ORAL_TABLET | Freq: Once | ORAL | 3 refills | Status: DC

## 2017-11-29 NOTE — Telephone Encounter (Signed)
Received requested info from [facility]. Sending back for review.

## 2017-12-01 ENCOUNTER — Other Ambulatory Visit: Payer: Self-pay | Admitting: Medical

## 2017-12-01 ENCOUNTER — Telehealth: Payer: Self-pay | Admitting: Medical

## 2017-12-01 MED ORDER — RABEPRAZOLE SODIUM 20 MG PO TBEC: 20.0000 mg | DELAYED_RELEASE_TABLET | Freq: Every day | ORAL | 3 refills | Status: DC

## 2017-12-01 MED ORDER — RANITIDINE HCL 150 MG PO TABS: 150.0000 mg | ORAL_TABLET | Freq: Every day | ORAL | 3 refills | Status: DC

## 2017-12-01 MED ORDER — VITAMIN D 1000 UNITS PO TABS
1000.0000 [IU] | ORAL_TABLET | Freq: Every day | ORAL | 3 refills | Status: DC
Start: 2017-12-01 — End: 2018-12-13

## 2017-12-01 NOTE — Telephone Encounter (Signed)
See her email message to forward her EKG and note to [facility].

## 2017-12-04 NOTE — Telephone Encounter (Signed)
Yes refer to whichever cardiology office she wants to see.

## 2017-12-04 NOTE — Telephone Encounter (Signed)
Pt called and wanted to know if she can have a referral sent to [facility] cardiology  Is this okay

## 2017-12-05 ENCOUNTER — Ambulatory Visit (HOSPITAL_COMMUNITY)
Admission: RE | Admit: 2017-12-05 | Discharge: 2017-12-05 | Disposition: A | Discharge status: Home / Self Care | Payer: BLUE CROSS/BLUE SHIELD | Source: Ambulatory Visit | Attending: Medical | Admitting: Medical

## 2017-12-05 DIAGNOSIS — R9431 Abnormal electrocardiogram [ECG] [EKG]: Secondary | ICD-10-CM | POA: Diagnosis not present

## 2017-12-05 DIAGNOSIS — Z0000 Encounter for general adult medical examination without abnormal findings: Secondary | ICD-10-CM | POA: Insufficient documentation

## 2017-12-05 DIAGNOSIS — R002 Palpitations: Secondary | ICD-10-CM | POA: Insufficient documentation

## 2017-12-05 NOTE — Progress Notes (Signed)
  Echocardiogram 2D Echocardiogram has been performed.  Barbara Odonnell L Androw 12/05/2017, 1:40 PM

## 2017-12-05 NOTE — Telephone Encounter (Signed)
Called and spoke with patient.

## 2017-12-05 NOTE — Telephone Encounter (Signed)
Called and l/m for pt call us back. Per shane she don't need to see cardiology , and have echo as well.

## 2017-12-21 DIAGNOSIS — Z9289 Personal history of other medical treatment: Secondary | ICD-10-CM

## 2017-12-21 HISTORY — DX: Personal history of other medical treatment: Z92.89

## 2017-12-21 LAB — HM DEXA SCAN

## 2017-12-27 ENCOUNTER — Telehealth: Payer: Self-pay | Admitting: Medical

## 2017-12-27 ENCOUNTER — Encounter: Payer: Self-pay | Admitting: Medical

## 2017-12-27 NOTE — Telephone Encounter (Signed)
Tell her congratulations on winning a bone density screen.   And even better news, it was NORMAL.    We can repeat this test again at 42 years old.

## 2017-12-27 NOTE — Telephone Encounter (Signed)
Called and l/m for pt to let know about her results

## 2018-01-09 ENCOUNTER — Encounter: Payer: Self-pay | Admitting: Medical

## 2018-01-29 ENCOUNTER — Other Ambulatory Visit: Payer: Self-pay | Admitting: Medical

## 2018-04-23 DIAGNOSIS — J342 Deviated nasal septum: Secondary | ICD-10-CM | POA: Diagnosis not present

## 2018-04-23 DIAGNOSIS — J310 Chronic rhinitis: Secondary | ICD-10-CM | POA: Diagnosis not present

## 2018-04-23 DIAGNOSIS — H6983 Other specified disorders of Eustachian tube, bilateral: Secondary | ICD-10-CM | POA: Diagnosis not present

## 2018-06-12 DIAGNOSIS — Z3040 Encounter for surveillance of contraceptives, unspecified: Secondary | ICD-10-CM | POA: Diagnosis not present

## 2018-06-12 DIAGNOSIS — Z139 Encounter for screening, unspecified: Secondary | ICD-10-CM | POA: Diagnosis not present

## 2018-06-12 DIAGNOSIS — Z1231 Encounter for screening mammogram for malignant neoplasm of breast: Secondary | ICD-10-CM | POA: Diagnosis not present

## 2018-06-12 DIAGNOSIS — Z01419 Encounter for gynecological examination (general) (routine) without abnormal findings: Secondary | ICD-10-CM | POA: Diagnosis not present

## 2018-06-12 DIAGNOSIS — Z6820 Body mass index (BMI) 20.0-20.9, adult: Secondary | ICD-10-CM | POA: Diagnosis not present

## 2018-06-12 DIAGNOSIS — N3281 Overactive bladder: Secondary | ICD-10-CM | POA: Diagnosis not present

## 2018-06-12 DIAGNOSIS — D649 Anemia, unspecified: Secondary | ICD-10-CM | POA: Diagnosis not present

## 2018-06-12 DIAGNOSIS — R32 Unspecified urinary incontinence: Secondary | ICD-10-CM | POA: Diagnosis not present

## 2018-06-12 DIAGNOSIS — Z113 Encounter for screening for infections with a predominantly sexual mode of transmission: Secondary | ICD-10-CM | POA: Diagnosis not present

## 2018-06-19 DIAGNOSIS — L700 Acne vulgaris: Secondary | ICD-10-CM | POA: Diagnosis not present

## 2018-06-19 DIAGNOSIS — H52223 Regular astigmatism, bilateral: Secondary | ICD-10-CM | POA: Diagnosis not present

## 2018-06-19 DIAGNOSIS — D485 Neoplasm of uncertain behavior of skin: Secondary | ICD-10-CM | POA: Diagnosis not present

## 2018-06-19 DIAGNOSIS — L2089 Other atopic dermatitis: Secondary | ICD-10-CM | POA: Diagnosis not present

## 2018-06-19 DIAGNOSIS — H524 Presbyopia: Secondary | ICD-10-CM | POA: Diagnosis not present

## 2018-06-19 DIAGNOSIS — H25813 Combined forms of age-related cataract, bilateral: Secondary | ICD-10-CM | POA: Diagnosis not present

## 2018-06-19 DIAGNOSIS — H5213 Myopia, bilateral: Secondary | ICD-10-CM | POA: Diagnosis not present

## 2018-06-28 ENCOUNTER — Other Ambulatory Visit: Payer: Self-pay | Admitting: Medical

## 2018-06-28 NOTE — Telephone Encounter (Signed)
Is this ok to refill?  

## 2018-07-04 DIAGNOSIS — N3641 Hypermobility of urethra: Secondary | ICD-10-CM | POA: Diagnosis not present

## 2018-07-04 DIAGNOSIS — N393 Stress incontinence (female) (male): Secondary | ICD-10-CM | POA: Diagnosis not present

## 2018-07-04 DIAGNOSIS — N3000 Acute cystitis without hematuria: Secondary | ICD-10-CM | POA: Diagnosis not present

## 2018-07-09 DIAGNOSIS — N393 Stress incontinence (female) (male): Secondary | ICD-10-CM | POA: Diagnosis not present

## 2018-07-09 DIAGNOSIS — M62838 Other muscle spasm: Secondary | ICD-10-CM | POA: Diagnosis not present

## 2018-07-09 DIAGNOSIS — M6281 Muscle weakness (generalized): Secondary | ICD-10-CM | POA: Diagnosis not present

## 2018-07-09 DIAGNOSIS — R102 Pelvic and perineal pain: Secondary | ICD-10-CM | POA: Diagnosis not present

## 2018-07-19 DIAGNOSIS — M62838 Other muscle spasm: Secondary | ICD-10-CM | POA: Diagnosis not present

## 2018-07-19 DIAGNOSIS — M6289 Other specified disorders of muscle: Secondary | ICD-10-CM | POA: Diagnosis not present

## 2018-07-19 DIAGNOSIS — M6281 Muscle weakness (generalized): Secondary | ICD-10-CM | POA: Diagnosis not present

## 2018-07-24 ENCOUNTER — Other Ambulatory Visit: Payer: Self-pay | Admitting: Medical

## 2018-07-24 NOTE — Telephone Encounter (Signed)
Is this ok to refill?  

## 2018-08-07 ENCOUNTER — Other Ambulatory Visit: Payer: Self-pay | Admitting: Medical

## 2018-08-07 NOTE — Telephone Encounter (Signed)
Is this ok to refill?  

## 2018-08-10 ENCOUNTER — Other Ambulatory Visit: Payer: Self-pay

## 2018-08-10 MED ORDER — FERREX 28 PO MISC: 1.0000 | Freq: Every day | ORAL | 0 refills | Status: DC

## 2018-08-10 NOTE — Telephone Encounter (Signed)
Pharmacy is requesting a 90 day supply for the pending medication

## 2018-08-20 DIAGNOSIS — S83401A Sprain of unspecified collateral ligament of right knee, initial encounter: Secondary | ICD-10-CM | POA: Diagnosis not present

## 2018-08-20 DIAGNOSIS — M25561 Pain in right knee: Secondary | ICD-10-CM | POA: Diagnosis not present

## 2018-08-21 DIAGNOSIS — M25561 Pain in right knee: Secondary | ICD-10-CM | POA: Diagnosis not present

## 2018-08-21 DIAGNOSIS — S83401A Sprain of unspecified collateral ligament of right knee, initial encounter: Secondary | ICD-10-CM | POA: Diagnosis not present

## 2018-08-23 ENCOUNTER — Ambulatory Visit (INDEPENDENT_AMBULATORY_CARE_PROVIDER_SITE_OTHER): Payer: BLUE CROSS/BLUE SHIELD | Admitting: Sports Medicine

## 2018-08-23 ENCOUNTER — Encounter: Payer: Self-pay | Admitting: Sports Medicine

## 2018-08-23 VITALS — BP 115/81 | Ht 67.0 in | Wt 155.0 lb

## 2018-08-23 DIAGNOSIS — M7052 Other bursitis of knee, left knee: Secondary | ICD-10-CM

## 2018-08-23 DIAGNOSIS — M25561 Pain in right knee: Secondary | ICD-10-CM | POA: Diagnosis not present

## 2018-08-23 NOTE — Assessment & Plan Note (Signed)
Most likely due to compensation from her right knee contusion. Patient given exercises to strength adductors, gracilis, and sartorius muscles. Patient will practice relative rest and ice the area regularly as well. This should improve as she heals from her right knee cartilage contusion.

## 2018-08-23 NOTE — Assessment & Plan Note (Signed)
Given history of injury, physical exam, and ultrasound findings most likely she had a contusion of her cartilage of her right knee. Relative rest for 3 weeks in avoiding high impact activity and running. Slowly progress back to running as pain allows and continue conservative treatment with ice, compression sleeve, and PT.

## 2018-08-23 NOTE — Progress Notes (Signed)
Subjective: HPI: Barbara Odonnell is a 42 y.o. presenting to clinic today to discuss the following:  Right Knee Pain Patient states she injured her [place] on [place] She continued to run and tried conservative therapy (ice, compression, heat) in hopes that it would get better but it has instead gotten progressively worse. She has not been running for the past 3 weeks and only doing biking and piliates exercises. She states it hurts to run almost immediately and she can walk on it but after 3-4 miles it will begin to hurt. Rest, ice, compression help relieve the pain and it has not been bad enough to take OTC Nsaids on a regular basis. She states it is painful on the right side of her knee and when she flexes her knee. It is also tender when she pushes or touches that area.  ROS:   No swelling, bruising, locking, or giving way.  She has been to PT and gotten dry needling  Left Knee Tenderness Some time after injury her right knee patient began to have soreness on the inside lower aspect of her knee. It hurts when she is active and is better with rest. It is sore to touch. Ice also helps. She does think it is slightly swollen.  ROS noted in HPI.   Past Medical, Surgical, Social, and Family History Reviewed & Updated per EMR.   Pertinent Historical Findings include:   Social History   Tobacco Use  Smoking Status Never Smoker  Smokeless Tobacco Never Used    Objective: BP 115/81   Ht 5' 7"  (1.702 m)   Wt 155 lb (70.3 kg)   BMI 24.28 kg/m  Vitals and nursing notes reviewed  Physical Exam Gen: Alert and Oriented x 3, NAD BP 115/81   Ht 5' 7"  (1.702 m)   Wt 155 lb (70.3 kg)   BMI 24.28 kg/m   Ext: no clubbing, cyanosis, or edema Neuro: No gross deficits Skin: warm, dry, intact, no rashes Right Knee Exam: No obvious deformities, effusion, erythema or ecchymosis. Tender to palpation at the medial joint line. FROM with 5/5 strength flexion.  Negative Lachmann's, negative varus/valgus test, negative McMurray's and negative Thessaly's test. Left Knee Exam: No obvious deformities, effusion, erythema or ecchymosis. Tender to palpation at the pes anserinus with mild effusion at the insertion site. FROM with 5/5 strength flexion. Negative Lachmann's, negative varus/valgus test, negative McMurray's and negative Thessaly's test.   Imaging: Right Knee U/S: Significant for mild suprapatellar effusion, effusion at and around the medial and lateral meniscus suggestive of cartilage contusion. No evidence of meniscal tear. MCL and LCL intact.  Left Knee U/S: Significant for mild suprapatellar effusion, no effusion at or around the lateral or medial meniscus and no evidence of meniscal tears. MCL and LCL intact. Mild hypoechoic change in the pes anserine bursa  Summary:  Mild swelling around both knees without tendon or meniscal tear  Ultrasound and interpretation by Wolfgang Phoenix. Nadezhda Pollitt, MD  Assessment/Plan:  Pes anserinus bursitis of left knee Most likely due to compensation from her right knee contusion. Patient given exercises to strength adductors, gracilis, and sartorius muscles. Patient will practice relative rest and ice the area regularly as well. This should improve as she heals from her right knee cartilage contusion.  Acute pain of right knee Given history of injury, physical exam, and ultrasound findings most likely she had a contusion of her cartilage of her right knee. Relative rest for 3  weeks in avoiding high impact activity and running. Slowly progress back to running as pain allows and continue conservative treatment with ice, compression sleeve, and PT.   PATIENT EDUCATION PROVIDED: See AVS    Diagnosis and plan along with any newly prescribed medication(s) were discussed in detail with this patient today. The patient verbalized understanding and agreed with the plan. Patient advised if symptoms worsen return to clinic or ER.    Harolyn Rutherford, DO 08/23/2018, 2:07 PM PGY-2 [facility]  I observed and examined the patient with the resident and agree with assessment and plan.  Note reviewed and modified by me. Stefanie Libel, MD

## 2018-08-23 NOTE — Patient Instructions (Signed)
It was great to meet you today! Thank you for letting me participate in your care!  Today, we discussed your right knee pain and left knee tenderness. These are two separate issues. The right knee pain is due to a cartilage contusion. Your ligaments and meniscus were all intact. Please refrain from running (or any high impact activity) for the next 3 weeks and do the set of exercises we provided to you. We have also given you a prescription so you can continue your physical therapy.  You also have left knee pes anserinus bursisits. This will respond well to rest, ice, and the exercises we provided for you. You may take OTC medications as needed. Please return if you do not have significant improvement in 3 weeks.  Be well, Harolyn Rutherford, DO PGY-2, [facility]

## 2018-09-04 DIAGNOSIS — M25561 Pain in right knee: Secondary | ICD-10-CM | POA: Diagnosis not present

## 2018-09-04 DIAGNOSIS — S83401A Sprain of unspecified collateral ligament of right knee, initial encounter: Secondary | ICD-10-CM | POA: Diagnosis not present

## 2018-09-05 DIAGNOSIS — M25561 Pain in right knee: Secondary | ICD-10-CM | POA: Diagnosis not present

## 2018-09-05 DIAGNOSIS — S83401A Sprain of unspecified collateral ligament of right knee, initial encounter: Secondary | ICD-10-CM | POA: Diagnosis not present

## 2018-09-21 ENCOUNTER — Encounter: Payer: Self-pay | Admitting: Medical

## 2018-09-21 ENCOUNTER — Ambulatory Visit: Payer: BLUE CROSS/BLUE SHIELD | Admitting: Medical

## 2018-09-21 VITALS — BP 130/82 | HR 79 | Temp 97.9°F | Ht 67.0 in | Wt 158.0 lb

## 2018-09-21 DIAGNOSIS — J4530 Mild persistent asthma, uncomplicated: Secondary | ICD-10-CM | POA: Diagnosis not present

## 2018-09-21 DIAGNOSIS — R112 Nausea with vomiting, unspecified: Secondary | ICD-10-CM

## 2018-09-21 MED ORDER — FLUTICASONE FUROATE-VILANTEROL 200-25 MCG/INH IN AEPB: 1.0000 | INHALATION_SPRAY | Freq: Every day | RESPIRATORY_TRACT | 11 refills | Status: DC

## 2018-09-21 MED ORDER — BUDESONIDE-FORMOTEROL FUMARATE 160-4.5 MCG/ACT IN AERO: INHALATION_SPRAY | RESPIRATORY_TRACT | 3 refills | Status: DC

## 2018-09-21 MED ORDER — PREDNISONE 10 MG PO TABS: ORAL_TABLET | ORAL | 0 refills | Status: DC

## 2018-09-21 MED ORDER — ONDANSETRON HCL 4 MG PO TABS: 4.0000 mg | ORAL_TABLET | Freq: Three times a day (TID) | ORAL | 0 refills | Status: DC | PRN

## 2018-09-21 NOTE — Progress Notes (Signed)
Subjective: Chief Complaint  Patient presents with  . Asthma    worsenign with cough and conestion whith wheezing    Here for asthma flare up, cough, congestion, wheezing.  She has been dealing with cold symptoms last few days, using albuterol rescue inhaler some time, using her Symbicort 1 puff daily, and took a few prednisone tablets she had leftover from prior prescription.  This did help the wheezing.  She notes in the past may be a year or 2 ago had breathing test and spirometry showing not much improvement with albuterol use.  She feels a little achy, has had subjective fever.  She is feels crappy in general, not improving.  Was in the bed all day yesterday.  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  She notes 5 times in the past year has had episodes of abrupt onset of vomiting, nausea, that comes out of the blue without trigger.  She has tried to keep a symptom diary and thinks she may have a food allergy to garlic.  She has an appointment with allergist next week to further investigate this.  She would like something to help with the nausea and vomiting when it comes on.  She uses Benadryl and this seems to help some.  She had 1 of these bad episodes this past week.  She notes that she tries to maintain good health, exercises quite regularly, is vegan, really health-conscious  She does report some allergy testing way back in 2004 with Dr. Orvil Feil.  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   Past Medical History:  Diagnosis Date  . Allergy   . Anemia    IV iron in past, prior to 2018  . Asthma   . Celiac disease    +prior biopsy  . GERD (gastroesophageal reflux disease)   . H/O bone density study 12/21/2017   normal  . Hip fracture ([place]) 2008   right, Dr. Oneida Alar  . History of stress test [address]  . Hypothyroidism   . Migraine  headache without aura    gets bad nausea.  does well on Naratriptan and Relpax.  Failed Imitrex, Maxalt  . Mood change    Wellbutrin, has used Zoloft prior  . Stress fracture    severeral prior  . Transfusion history    has had IV iron prior, Dr. Marin Olp prior management  . Wears contact lenses    Current Outpatient Medications on File Prior to Visit  Medication Sig Dispense Refill  . albuterol (PROAIR HFA) 108 (90 Base) MCG/ACT inhaler NHALE 2 PUFFS EVERY 4 HOURS 18 Inhaler 1  . albuterol (PROVENTIL) (2.5 MG/3ML) 0.083% nebulizer solution Take 3 mLs (2.5 mg total) by nebulization every 6 (six) hours as needed for wheezing or shortness of breath. 75 mL 3  . buPROPion (WELLBUTRIN XL) 150 MG 24 hr tablet Take 1 tablet (150 mg total) by mouth daily. 90 tablet 3  . cholecalciferol (VITAMIN D) 1000 units tablet Take 1 tablet (1,000 Units total) by mouth daily. 90 tablet 3  . etonogestrel-ethinyl estradiol (NUVARING) 0.12-0.015 MG/24HR vaginal ring Place 1 each vaginally every 28 (twenty-eight) days. Insert vaginally and leave in place for 3 consecutive weeks, then remove for 1 week.    . FeAspGl-FeFum-B12-FA-C-Succ Ac (FERREX 28) MISC Take 1 tablet by mouth daily. 90 each 0  .  naratriptan (AMERGE) 2.5 MG tablet TAKE 1 TABLET AS NEEDED FOR MIGRAINE, MAY REPEAT AFTER 4 HOURS (MAX 5MG IN 24HRS) 30 tablet 2  . naratriptan (AMERGE) 2.5 MG tablet TAKE 1 TABLET AS NEEDED FOR MIGRAINE, MAY REPEAT AFTER 4 HOURS (MAX 5MG IN 24HRS) 30 tablet 0  . RABEprazole (ACIPHEX) 20 MG tablet Take 1 tablet (20 mg total) by mouth daily. 90 tablet 3  . Spacer/Aero-Holding Chambers (AEROCHAMBER MV) inhaler Use as instructed 1 each 0  . SYNTHROID 50 MCG tablet Take 1 tablet (50 mcg total) by mouth once for 1 dose. 90 tablet 3   No current facility-administered medications on file prior to visit.    ROS as in subjective   Objective: BP 130/82   Pulse 79   Temp 97.9 F (36.6 C) (Oral)   Ht 5' 7"  (1.702 m)   Wt 158  lb (71.7 kg)   SpO2 98%   BMI 24.75 kg/m   Wt Readings from Last 3 Encounters:  09/21/18 158 lb (71.7 kg)  08/23/18 155 lb (70.3 kg)  11/28/17 147 lb 9.6 oz (67 kg)   General appearance: alert, no distress, WD/WN,  HEENT: normocephalic, sclerae anicteric, TMs pearly, nares with erythema, clear discharge, pharynx normal Oral cavity: MMM, no lesions Neck: supple, no lymphadenopathy, no thyromegaly, no masses Heart: RRR, normal S1, S2, no murmurs Lungs: CTA bilaterally, no wheezes, rhonchi, or rales Abdomen: +bs, soft, non tender, non distended, no masses, no hepatomegaly, no splenomegaly Pulses: 2+ symmetric, upper and lower extremities, normal cap refill    Assessment: Encounter Diagnoses  Name Primary?  . Mild persistent asthma without complication Yes  . Nausea and vomiting, intractability of vomiting not specified, unspecified vomiting type      Plan: Symptoms suggest viral cold symptoms flaring up her asthma for the acute symptoms.  Advised she could probably benefit from increased dosing of the Symbicort instead of once daily.  Advised that the correct dosing is 2 puffs twice daily.  However insurance is going to not cover the Symbicort so we will change to [facility] as below.  Gave short-term prescription of prednisone for flareup, continue albuterol as needed every 6 hours.  If not much improved within the next few days call back  Zofran prescribed for nausea as needed.  Follow-up with allergist as planned  Barbara Odonnell was seen today for asthma.  Diagnoses and all orders for this visit:  Mild persistent asthma without complication  Nausea and vomiting, intractability of vomiting not specified, unspecified vomiting type  Other orders -     ondansetron (ZOFRAN) 4 MG tablet; Take 1 tablet (4 mg total) by mouth every 8 (eight) hours as needed for nausea or vomiting. -     predniSONE (DELTASONE) 10 MG tablet; 6/5/4/3/2/1 taper -     Discontinue: budesonide-formoterol (SYMBICORT)  160-4.5 MCG/ACT inhaler; INHALE 2 PUFFS INTO THE LUNGS 2 (TWO) TIMES DAILY. -     fluticasone furoate-vilanterol (BREO ELLIPTA) 200-25 MCG/INH AEPB; Inhale 1 puff into the lungs daily.

## 2018-09-21 NOTE — Patient Instructions (Signed)
Receommendations:

## 2018-09-24 ENCOUNTER — Telehealth: Payer: Self-pay | Admitting: Medical

## 2018-09-24 ENCOUNTER — Other Ambulatory Visit: Payer: Self-pay | Admitting: Medical

## 2018-09-24 MED ORDER — AMOXICILLIN 875 MG PO TABS: 875.0000 mg | ORAL_TABLET | Freq: Two times a day (BID) | ORAL | 0 refills | Status: DC

## 2018-09-24 NOTE — Telephone Encounter (Signed)
Ok.   antibiotic sent, Amoxicillin

## 2018-09-24 NOTE — Telephone Encounter (Signed)
Patient notified

## 2018-09-24 NOTE — Telephone Encounter (Signed)
Pt called and stated that symbicort is helping with URI symptoms, however, she sill is feverish and clammy. She now has sinus issues and a sinus headache. She is thinking that she may need a antibiotic. [facility] and can be reached at [phone].

## 2018-09-25 ENCOUNTER — Telehealth: Payer: Self-pay | Admitting: Medical

## 2018-09-25 NOTE — Telephone Encounter (Signed)
  Patient called she wants pill called in for yeast infection, states she typically gets one with antibiotic and wanted to call now since we will be closed. She is also taking a probiotic   [facility]

## 2018-09-26 ENCOUNTER — Other Ambulatory Visit: Payer: Self-pay | Admitting: Medical

## 2018-09-26 MED ORDER — FLUCONAZOLE 150 MG PO TABS: ORAL_TABLET | ORAL | 0 refills | Status: DC

## 2018-09-26 NOTE — Telephone Encounter (Signed)
I sent diflucan for yeast, 1 now and may repeat in 1 week

## 2018-09-26 NOTE — Telephone Encounter (Signed)
Left message on voicemail that rx has been called in.

## 2018-10-03 DIAGNOSIS — M25561 Pain in right knee: Secondary | ICD-10-CM | POA: Diagnosis not present

## 2018-10-03 DIAGNOSIS — S83401A Sprain of unspecified collateral ligament of right knee, initial encounter: Secondary | ICD-10-CM | POA: Diagnosis not present

## 2018-10-13 ENCOUNTER — Other Ambulatory Visit: Payer: Self-pay | Admitting: Medical

## 2018-10-16 ENCOUNTER — Ambulatory Visit: Payer: BLUE CROSS/BLUE SHIELD | Admitting: Allergy and Immunology

## 2018-10-16 ENCOUNTER — Encounter: Payer: Self-pay | Admitting: Allergy and Immunology

## 2018-10-16 VITALS — BP 102/74 | HR 80 | Temp 98.4°F | Resp 18 | Ht 66.5 in | Wt 156.0 lb

## 2018-10-16 DIAGNOSIS — G43909 Migraine, unspecified, not intractable, without status migrainosus: Secondary | ICD-10-CM

## 2018-10-16 DIAGNOSIS — J4540 Moderate persistent asthma, uncomplicated: Secondary | ICD-10-CM

## 2018-10-16 NOTE — Patient Instructions (Addendum)
  1.  Allergen avoidance measures  2.  Slowly consolidate all caffeine consumption over 4 weeks  3.  Continue Symbicort 160 - 2 inhalations twice a day  4.  Continue pro-air HFA - 2 inhalations every 4-6 hours if needed  5.  Return to clinic in 4 weeks or earlier if problem  6.  Further evaluation?  Yes if recurrent reactions

## 2018-10-16 NOTE — Progress Notes (Signed)
Dear Barbara Odonnell,  Thank you for referring Barbara Odonnell to the [facility] on 10/16/2018.   Below is a summation of this patient's evaluation and recommendations.  Thank you for your referral. I will keep you informed about this patient's response to treatment.   If you have any questions please do not hesitate to contact me.   Sincerely,  Jiles Prows, MD Allergy / Immunology [place]   ______________________________________________________________________    NEW PATIENT NOTE  Referring Provider: Carlena Hurl, PA-C Primary Provider: Caryl Ada Date of office visit: 10/16/2018    Subjective:   Chief Complaint:  Barbara Odonnell (DOB: 08/08/1976) is a 42 y.o. female who presents to the clinic on 10/16/2018 with a chief complaint of Vomiting .     HPI: Barbara Odonnell presents to this clinic in evaluation of possible allergic reaction.  Barbara Odonnell develops a syndrome where she has unrelenting uncontrolled vomiting that will go on for 40 to 60 minutes per episode usually 1 to 3 hours after eating a meal.  There is no associated systemic or constitutional symptoms.  There is no diarrhea or respiratory tract symptoms or skin issues.  However, these are all preceded by a very low-grade headache for which she will take Emerge and Compazine.  After she empties her gastrum of all food content she will then take a Benadryl and occasionally an alprazolam.  There is no obvious trigger giving rise to this issue.  There is not a consistent food that may be giving rise to this issue.  Possibly it is garlic but she has eaten garlic on occasion with no problem at all.  Apparently she had a similar type of syndrome back in 2007 and 2008.  Apparently at that point in time she did have extensive evaluation for GI abnormality.  She does have migraine headaches occurring about twice a week for which  she will use Amerge and occasionally Compazine.  She has apparently seen a neurologist in the past and has failed Topamax therapy in 2014.  Most of her headaches are triggered off by exercise.  She does drink 4 cups of coffee and 1 green tea per day.  There is a history of atopic disease with asthma that is well controlled while using Symbicort.  She will use a short acting bronchodilator prior to the performance of exercise but otherwise does not really use this in a rescue mode very often.  Viral infections do appear to precipitate a significant asthma exacerbation for which she will take a systemic steroid.  Her frequency of systemic steroid use over the course of the past year has been 2.  There is a history of utilizing immunotherapy for several years which did help her atopic respiratory disease.  She also apparently has silent reflux.  She is using AcipHex on a daily basis which she has been doing so for the past 5 years.  Apparently she was having activity of her asthma precipitated by reflux and when she had a upper endoscopy performed she had rather significant evidence of esophageal reflux.  Past Medical History:  Diagnosis Date  . Allergy   . Anemia    IV iron in past, prior to 2018  . Asthma   . Celiac disease    +prior biopsy  . GERD (gastroesophageal reflux disease)   . H/O bone density study 12/21/2017   normal  . Hip  fracture [facility]) 2008   right, Dr. Oneida Alar  . History of stress test [address]  . Hypothyroidism   . Migraine headache without aura    gets bad nausea.  does well on Naratriptan and Relpax.  Failed Imitrex, Maxalt  . Mood change    Wellbutrin, has used Zoloft prior  . Stress fracture    severeral prior  . Transfusion history    has had IV iron prior, Dr. Marin Olp prior management  . Wears contact lenses     Past Surgical History:  Procedure Laterality Date  . COLONOSCOPY  2015   [facility], advised to repeat q5 years; x 4 prior  .  ESOPHAGOGASTRODUODENOSCOPY     x5 prior as of 2016  . INGUINAL HERNIA REPAIR     left  . NASAL SEPTUM SURGERY    . POLYPECTOMY     sigmoid colon  . SINOSCOPY      Allergies as of 10/16/2018      Reactions   Sulfa Antibiotics Anaphylaxis   Singulair [montelukast Sodium]    Angry, irritable, out of skin feeling      Medication List      AEROCHAMBER MV inhaler Use as instructed   albuterol 108 (90 Base) MCG/ACT inhaler Commonly known as:  PROAIR HFA NHALE 2 PUFFS EVERY 4 HOURS   buPROPion 150 MG 24 hr tablet Commonly known as:  WELLBUTRIN XL Take 1 tablet (150 mg total) by mouth daily.   cholecalciferol 1000 units tablet Commonly known as:  VITAMIN D Take 1 tablet (1,000 Units total) by mouth daily.   etonogestrel-ethinyl estradiol 0.12-0.015 MG/24HR vaginal ring Commonly known as:  [place] 1 each vaginally every 28 (twenty-eight) days. Insert vaginally and leave in place for 3 consecutive weeks, then remove for 1 week.   FERREX 28 Misc Take 1 tablet by mouth daily.   naratriptan 2.5 MG tablet Commonly known as:  AMERGE TAKE 1 TABLET AS NEEDED FOR MIGRAINE, MAY REPEAT AFTER 4 HOURS (MAX 5MG IN 24HRS)   ondansetron 4 MG tablet Commonly known as:  ZOFRAN Take 1 tablet (4 mg total) by mouth every 8 (eight) hours as needed for nausea or vomiting.   RABEprazole 20 MG tablet Commonly known as:  ACIPHEX Take 1 tablet (20 mg total) by mouth daily.   SYNTHROID 50 MCG tablet Generic drug:  levothyroxine Take 1 tablet (50 mcg total) by mouth once for 1 dose.       Review of systems negative except as noted in HPI / PMHx or noted below:  Review of Systems  Constitutional: Negative.   HENT: Negative.   Eyes: Negative.   Respiratory: Negative.   Cardiovascular: Negative.   Gastrointestinal: Negative.   Genitourinary: Negative.   Musculoskeletal: Negative.   Skin: Negative.   Neurological: Negative.   Endo/Heme/Allergies: Negative.     Psychiatric/Behavioral: Negative.     Family History  Problem Relation Age of Onset  . Hypertension Mother   . Osteopenia Mother   . Cancer Father        asbestos related lung cancer  . Heart disease Father 35  . Pulmonary embolism Father   . Celiac disease Brother   . Celiac disease Brother   . Stroke Maternal Grandmother   . COPD Maternal Grandmother   . Osteopenia Maternal Grandmother   . Heart disease Maternal Grandfather   . Stroke Maternal Grandfather   . Heart disease Paternal Grandfather        died 41yo  .  Diabetes Neg Hx     Social History   Socioeconomic History  . Marital status: Single    Spouse name: Not on file  . Number of children: Not on file  . Years of education: Not on file  . Highest education level: Not on file  Occupational History  . Not on file  Social Needs  . Financial resource strain: Not on file  . Food insecurity:    Worry: Not on file    Inability: Not on file  . Transportation needs:    Medical: Not on file    Non-medical: Not on file  Tobacco Use  . Smoking status: Never Smoker  . Smokeless tobacco: Never Used  Substance and Sexual Activity  . Alcohol use: No    Alcohol/week: 1.0 standard drinks    Types: 1 Glasses of wine per week    Comment: rare  . Drug use: No  . Sexual activity: Not on file  Lifestyle  . Physical activity:    Days per week: Not on file    Minutes per session: Not on file  . Stress: Not on file  Relationships  . Social connections:    Talks on phone: Not on file    Gets together: Not on file    Attends religious service: Not on file    Active member of club or organization: Not on file    Attends meetings of clubs or organizations: Not on file    Relationship status: Not on file  . Intimate partner violence:    Fear of current or ex partner: Not on file    Emotionally abused: Not on file    Physically abused: Not on file    Forced sexual activity: Not on file  Other Topics Concern  . Not on  file  Social History Narrative   Lives alone, works professor at [employer], now counseling at [employer],  Exercise - 6-7 days per week, teaches spin at [employer], swam competitively in college, marathon runner.      [employer] Pulmonary:   Originally from [place]. Has also lived in [place], [place], & [place]. Currently working in counseling. No pets currently. No bird exposure. No mold or hot tub exposure. Currently has carpet in the bedroom. No draperies. No exposure to asbestos.     Environmental and Social history  Lives in a condominium with a dry environment, no animals located inside the household, carpet in the bedroom, no plastic on the bed, plastic on the pillow, no smokers located inside the household.  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  Objective:   Vitals:   10/16/18 1338  BP: 102/74  Pulse: 80  Resp: 18  Temp: 98.4 F (36.9 C)  SpO2: 97%   Height: 5' 6.5" (168.9 cm) Weight: 156 lb (70.8 kg)  Physical Exam Constitutional:      Appearance: She is not diaphoretic.  HENT:     Head: Normocephalic. No right periorbital erythema or left periorbital erythema.     Right Ear: Tympanic membrane, ear canal and external ear normal.     Left Ear: Tympanic membrane, ear canal and external ear normal.     Nose: Nose normal. No mucosal edema or rhinorrhea.     Mouth/Throat:     Pharynx: No oropharyngeal exudate.  Eyes:     General: Lids are normal.     Conjunctiva/sclera: Conjunctivae normal.     Pupils: Pupils are equal, round, and reactive to light.  Neck:     Thyroid: No thyromegaly.     Trachea: Trachea normal. No tracheal deviation.  Cardiovascular:     Rate and Rhythm: Normal rate and regular rhythm.     Heart sounds: Normal heart sounds, S1 normal and S2 normal. No murmur.  Pulmonary:     Effort: Pulmonary effort is normal. No respiratory distress.     Breath sounds: No stridor. No wheezing or rales.  Chest:     Chest wall: No  tenderness.  Abdominal:     General: There is no distension.     Palpations: Abdomen is soft. There is no mass.     Tenderness: There is no abdominal tenderness. There is no guarding or rebound.  Musculoskeletal:        General: No tenderness.  Lymphadenopathy:     Head:     Right side of head: No tonsillar adenopathy.     Left side of head: No tonsillar adenopathy.     Cervical: No cervical adenopathy.  Skin:    Coloration: Skin is not pale.     Findings: No erythema or rash.     Nails: There is no clubbing.   Neurological:     Mental Status: She is alert.     Diagnostics: Allergy skin tests were performed.  She demonstrated hypersensitivity to grasses, weeds, and trees.  She also had slight hypersensitivity against peanut, soybean, sesame, almond, cantaloupe, watermelon, and ginger.  It should be noted that she can eat all these foods without the development of any significant reactivity.  Spirometry was performed and demonstrated an FEV1 of 2.63 @ 82 % of predicted. FEV1/FVC = 0.73  Assessment and Plan:    1. Asthma, moderate persistent, well-controlled   2. Migraine syndrome     1.  Allergen avoidance measures  2.  Slowly consolidate all caffeine consumption over 4 weeks  3.  Continue Symbicort 160 - 2 inhalations twice a day  4.  Continue pro-air HFA - 2 inhalations every 4-6 hours if needed  5.  Return to clinic in 4 weeks or earlier if problem  6.  Further evaluation?  Yes if recurrent reactions  There is no doubt that Barbara Odonnell has atopic disease especially directed against pollens.  It is also quite possible that she has some form of pollen associated food hypersensitivity, but given her low-grade headache that appears to occur during the point in time in which she develops these very acute onset vomiting episodes, and the lack of any other associated atopic symptomatology with these episodes, I think that her issue may be tied up with visceral migraine.  We will  approach this issue at this point in time assuming that this is a visceral migraine and we will have her taper off her rather extensive caffeine consumption over the course of the next 4 weeks.  If she still continues to have problems in the context of this approach then we may need to have her undergo further evaluation for other disease states that can be responsible for her syndrome including possible mastocytosis and true visceral anaphylaxis.  I will see her back in this clinic in 4 weeks or earlier if there is a problem.  At this point there is no need for any alteration in therapy regarding her asthma as it appears to be under very good control on her current therapy.  Jiles Prows, MD Allergy / Immunology [facility]

## 2018-10-17 ENCOUNTER — Encounter: Payer: Self-pay | Admitting: Allergy and Immunology

## 2018-10-27 ENCOUNTER — Other Ambulatory Visit: Payer: Self-pay | Admitting: Medical

## 2018-11-01 DIAGNOSIS — S83401A Sprain of unspecified collateral ligament of right knee, initial encounter: Secondary | ICD-10-CM | POA: Diagnosis not present

## 2018-11-01 DIAGNOSIS — M25561 Pain in right knee: Secondary | ICD-10-CM | POA: Diagnosis not present

## 2018-11-02 DIAGNOSIS — M25561 Pain in right knee: Secondary | ICD-10-CM | POA: Diagnosis not present

## 2018-11-02 DIAGNOSIS — S83401A Sprain of unspecified collateral ligament of right knee, initial encounter: Secondary | ICD-10-CM | POA: Diagnosis not present

## 2018-12-12 ENCOUNTER — Other Ambulatory Visit: Payer: Self-pay | Admitting: Medical

## 2018-12-12 NOTE — Telephone Encounter (Signed)
Are these ok to refill? 

## 2018-12-23 ENCOUNTER — Telehealth: Payer: Self-pay | Admitting: Medical

## 2018-12-23 NOTE — Telephone Encounter (Signed)
P.A. RABEPRAZOLE

## 2019-01-07 NOTE — Telephone Encounter (Signed)
Please let her know about the insurance denial and see if she is agreeable to the switch, or does she have other preferences?

## 2019-01-07 NOTE — Telephone Encounter (Signed)
Preferred medication is Omeprazole 29m, can pt be switched ?

## 2019-01-10 NOTE — Telephone Encounter (Signed)
Left message for pt

## 2019-01-10 NOTE — Telephone Encounter (Signed)
Pt called & states she has tried Omeprazole and Nexium, has been to [facility] specialist and tried mulitple meds and would like to stay on Aciphex

## 2019-01-18 ENCOUNTER — Other Ambulatory Visit: Payer: Self-pay | Admitting: Medical

## 2019-01-18 NOTE — Telephone Encounter (Signed)
Is this ok to refill?  

## 2019-01-19 NOTE — Telephone Encounter (Signed)
P.A. approved til 01/14/20, called pharmacy went thru for $61, left message for pt

## 2019-01-21 ENCOUNTER — Other Ambulatory Visit: Payer: Self-pay | Admitting: Medical

## 2019-01-21 NOTE — Telephone Encounter (Signed)
Is this ok to refill?  

## 2019-01-21 NOTE — Telephone Encounter (Signed)
Get in for yearly physical.   I refilled Wellbutrin in 12/2018 for 90 day supply but just got another refill request.

## 2019-01-22 NOTE — Telephone Encounter (Signed)
Spoke to pt about wellbutrin rx, she did not remember filling rx but looked and found that she did have it . She scheduled cpe for 02/25/19  She does need refill on her symbicort

## 2019-01-23 ENCOUNTER — Other Ambulatory Visit: Payer: Self-pay | Admitting: Medical

## 2019-01-23 MED ORDER — BUDESONIDE-FORMOTEROL FUMARATE 160-4.5 MCG/ACT IN AERO: INHALATION_SPRAY | RESPIRATORY_TRACT | 3 refills | Status: DC

## 2019-01-23 NOTE — Telephone Encounter (Signed)
I sent Symbicort.    After reviewing back in chart, Wellbutrin was refilled in February for 90 day supply, so not sure why we are getting refill request on Wellbutrin now.     Also, verify she is not taking Symbicort simultaneously overlapping with other prior prevention inhalers such as Advair and Breo which are listed under expired medications.

## 2019-01-24 ENCOUNTER — Other Ambulatory Visit: Payer: Self-pay | Admitting: Medical

## 2019-01-27 ENCOUNTER — Other Ambulatory Visit: Payer: Self-pay | Admitting: Medical

## 2019-02-04 ENCOUNTER — Other Ambulatory Visit: Payer: Self-pay

## 2019-02-04 ENCOUNTER — Encounter: Payer: Self-pay | Admitting: Medical

## 2019-02-04 ENCOUNTER — Telehealth: Payer: Self-pay | Admitting: Medical

## 2019-02-04 ENCOUNTER — Ambulatory Visit: Payer: BLUE CROSS/BLUE SHIELD | Admitting: Medical

## 2019-02-04 VITALS — HR 96 | Temp 98.1°F | Ht 67.0 in | Wt 155.0 lb

## 2019-02-04 DIAGNOSIS — J019 Acute sinusitis, unspecified: Secondary | ICD-10-CM

## 2019-02-04 DIAGNOSIS — J4530 Mild persistent asthma, uncomplicated: Secondary | ICD-10-CM | POA: Diagnosis not present

## 2019-02-04 DIAGNOSIS — E039 Hypothyroidism, unspecified: Secondary | ICD-10-CM | POA: Diagnosis not present

## 2019-02-04 DIAGNOSIS — G43709 Chronic migraine without aura, not intractable, without status migrainosus: Secondary | ICD-10-CM

## 2019-02-04 DIAGNOSIS — J301 Allergic rhinitis due to pollen: Secondary | ICD-10-CM | POA: Insufficient documentation

## 2019-02-04 MED ORDER — BUPROPION HCL ER (XL) 150 MG PO TB24: 150.0000 mg | ORAL_TABLET | Freq: Every day | ORAL | 0 refills | Status: DC

## 2019-02-04 MED ORDER — AMOXICILLIN-POT CLAVULANATE 875-125 MG PO TABS: 1.0000 | ORAL_TABLET | Freq: Two times a day (BID) | ORAL | 0 refills | Status: DC

## 2019-02-04 NOTE — Telephone Encounter (Signed)
Pt called and is requesting a refill on her well butrin she know it is early but wants to go ahead have it on file [id number] [place], [place] - [address]

## 2019-02-04 NOTE — Progress Notes (Signed)
Subjective:     Patient ID: Barbara Odonnell, female   DOB: [birth year], 43 y.o.   MRN: [id number]  Documentation for virtual audio and video telecommunications through [place] encounter:  The patient was located at home. The provider was located in the office. The patient did consent to this visit and is aware of possible charges through their insurance for this visit.  The other persons participating in this telemedicine service were none. Time spent on call was 15 minutes and in review of previous records >20 minutes total.  This virtual service is not related to other E/M service within previous 7 days.   HPI Chief Complaint  Patient presents with  . sinus infection    cough, pain in face, nose, headache, congestions, X 1 week   Virtual visit today for possible sinus infection.  She has a history of allergies, doing nasal saline rinse 5 times a day per prior ENT instructions.  In the last 5 days or so has had left sinus pressure, thicker discharge on that side, fatigue, scratchy throat, headache for 3 days, mild wheezing in the upper airway, occasional cough.  Bought a pulse ox meter for home use but that has been showing normal readings.  Denies fever, nausea, vomiting, ear pain.    Regarding asthma, currently using Symbicort 2 puffs in the morning sometimes 1 or 2 puffs at night, using albuterol some but in the past spirometry did not show significant improvement with albuterol.  Has a home nebulizer machine as well but not using this.  In the past did better on Breo inhaler.  Allergies- using nasal saline spray 5 times daily, in the past did not do well with Singulair.  She has questions about her risk with the COVID-19  Compliant with her thyroid medicine and other medicines as usual.  Past Medical History:  Diagnosis Date  . Allergy   . Anemia    IV iron in past, prior to 2018  . Asthma   . Celiac disease    +prior biopsy  . GERD (gastroesophageal reflux disease)   . H/O  bone density study 12/21/2017   normal  . Hip fracture ([place]) 2008   right, Dr. Oneida Alar  . History of stress test [address]  . Hypothyroidism   . Migraine headache without aura    gets bad nausea.  does well on Naratriptan and Relpax.  Failed Imitrex, Maxalt  . Mood change    Wellbutrin, has used Zoloft prior  . Stress fracture    severeral prior  . Transfusion history    has had IV iron prior, Dr. Marin Olp prior management  . Wears contact lenses    Current Outpatient Medications on File Prior to Visit  Medication Sig Dispense Refill  . albuterol (PROVENTIL HFA;VENTOLIN HFA) 108 (90 Base) MCG/ACT inhaler INHALE 2 PUFFS EVERY 4 HOURS 18 Inhaler 0  . budesonide-formoterol (SYMBICORT) 160-4.5 MCG/ACT inhaler INHALE 2 PUFFS INTO THE LUNGS 2 (TWO) TIMES DAILY. 10.2 Inhaler 3  . [facility] D3 25 MCG (1000 UT) capsule TAKE 1 CAPSULE BY MOUTH EVERY DAY 90 capsule 0  . etonogestrel-ethinyl estradiol (NUVARING) 0.12-0.015 MG/24HR vaginal ring Place 1 each vaginally every 28 (twenty-eight) days. Insert vaginally and leave in place for 3 consecutive weeks, then remove for 1 week.    . FeAspGl-FeFum-B12-FA-C-Succ Ac (FERREX 28) MISC Take 1 tablet by mouth daily. 90 each 0  . naratriptan (AMERGE) 2.5 MG tablet TAKE 1 TABLET AS NEEDED FOR MIGRAINE, MAY REPEAT AFTER  4 HOURS (MAX 5MG IN 24HRS) 30 tablet 2  . ondansetron (ZOFRAN) 4 MG tablet Take 1 tablet (4 mg total) by mouth every 8 (eight) hours as needed for nausea or vomiting. 20 tablet 0  . RABEprazole (ACIPHEX) 20 MG tablet TAKE 1 TABLET BY MOUTH EVERY DAY 90 tablet 0  . Spacer/Aero-Holding Chambers (AEROCHAMBER MV) inhaler Use as instructed 1 each 0  . SYNTHROID 50 MCG tablet TAKE 1 TABLET (50 MCG TOTAL) BY MOUTH ONCE DAILY 90 tablet 0   No current facility-administered medications on file prior to visit.      Review of Systems As in subjective     Objective:   Physical Exam   Due to coronavirus pandemic stay at home measures, patient  visit was virtual and they were not examined in person.   Well appearing, answers questions appropriately     Assessment:     Encounter Diagnoses  Name Primary?  . Acute sinusitis, recurrence not specified, unspecified location Yes  . Mild persistent asthma without complication   . Chronic migraine without aura without status migrainosus, not intractable   . Hypothyroidism, unspecified type   . Allergic rhinitis due to pollen, unspecified seasonality        Plan:     Acute sinusitis-begin Augmentin, rest, hydrate well, continue nasal saline spray as she is doing.  If not much improved within the next week then call back  Allergies and asthma-continue current regimen unchanged.  She is going to check with insurance to see if they will cover [employer] this year as it worked better in the past but was too expensive.  Consider anticholinergic such as Spiriva in the future if she does not respond to the current regimen as well  She will return in the summertime for physical and fasting labs.  Was due before now but with the coronavirus pandemic we are delaying physicals  I know that they like many people are concerned about personal safety and transmission of this Coronavirus.  In general I recommend they avoid contact with people as much as possible for the foreseeable next several weeks, likely through April.   Obviously if they have to go to the grocery store or fuel station, then limit contact with people, and try to keep at least a 6 foot distance away from folks.  If still working, try and stay well away from people while at work to limit exposure to moisture droplets such as from sneezing, coughing, and general close conversation.   Coronavirus is spread through these droplets.  Likewise, limit touching surfaces others touch such as door knobs, buggy handles, gas pumps.  Use gloves, or use alcohol hand gel to help decreased chance of catching germs when pumping gas for example.  Avoid  touching face, eyes, noes.   Wash hands frequently with soap and water for at least 20 minutes  Barbara Odonnell was seen today for sinus infection.  Diagnoses and all orders for this visit:  Acute sinusitis, recurrence not specified, unspecified location  Mild persistent asthma without complication  Chronic migraine without aura without status migrainosus, not intractable  Hypothyroidism, unspecified type  Allergic rhinitis due to pollen, unspecified seasonality  Other orders -     amoxicillin-clavulanate (AUGMENTIN) 875-125 MG tablet; Take 1 tablet by mouth 2 (two) times daily. -     buPROPion (WELLBUTRIN XL) 150 MG 24 hr tablet; Take 1 tablet (150 mg total) by mouth daily.

## 2019-02-05 ENCOUNTER — Ambulatory Visit: Payer: BLUE CROSS/BLUE SHIELD | Admitting: Medical

## 2019-02-06 ENCOUNTER — Encounter: Payer: Self-pay | Admitting: Medical

## 2019-02-06 ENCOUNTER — Telehealth: Payer: Self-pay | Admitting: Medical

## 2019-02-06 NOTE — Telephone Encounter (Signed)
Barbara Odonnell Please write her for a work excuse for illness out all week, Monday - Friday.  Please email it or my chart it to her.    Of note, she will come Monday to pickup sample Spiriva inhaler in brown bag.   I spoke to her.  sympotms not a lot different, not a lot worse.   C/t same plan, antibotic, c/t prevention inhaler, c/t albuterol q4-6 hours, consider adding Mucinex, incresae hydration.  If not hydrating, worse dyspnea, then go to the ED.

## 2019-02-06 NOTE — Telephone Encounter (Signed)
done

## 2019-02-06 NOTE — Telephone Encounter (Signed)
Pt called and stated that some of her symptoms have changed. She states that now her chest is tighter and it hurts when she takes a deep breath. She also states that she has a low grade temp and is extremely tired. She also needs a work note. Pt can be reached at [phone].

## 2019-02-11 ENCOUNTER — Encounter: Payer: Self-pay | Admitting: Medical

## 2019-02-11 ENCOUNTER — Other Ambulatory Visit: Payer: Self-pay

## 2019-02-11 ENCOUNTER — Telehealth: Payer: Self-pay | Admitting: *Deleted

## 2019-02-11 ENCOUNTER — Telehealth: Payer: Self-pay | Admitting: Medical

## 2019-02-11 ENCOUNTER — Ambulatory Visit: Payer: BLUE CROSS/BLUE SHIELD | Admitting: Medical

## 2019-02-11 VITALS — HR 61 | Temp 99.1°F | Wt 155.0 lb

## 2019-02-11 DIAGNOSIS — B3731 Acute candidiasis of vulva and vagina: Secondary | ICD-10-CM | POA: Insufficient documentation

## 2019-02-11 DIAGNOSIS — E039 Hypothyroidism, unspecified: Secondary | ICD-10-CM | POA: Diagnosis not present

## 2019-02-11 DIAGNOSIS — J4530 Mild persistent asthma, uncomplicated: Secondary | ICD-10-CM

## 2019-02-11 DIAGNOSIS — R519 Headache, unspecified: Secondary | ICD-10-CM | POA: Insufficient documentation

## 2019-02-11 DIAGNOSIS — J301 Allergic rhinitis due to pollen: Secondary | ICD-10-CM | POA: Diagnosis not present

## 2019-02-11 DIAGNOSIS — R5383 Other fatigue: Secondary | ICD-10-CM

## 2019-02-11 DIAGNOSIS — R51 Headache: Secondary | ICD-10-CM

## 2019-02-11 DIAGNOSIS — B373 Candidiasis of vulva and vagina: Secondary | ICD-10-CM

## 2019-02-11 MED ORDER — PEAK FLOW METER DEVI: 1.0000 | Freq: Every day | 0 refills | Status: DC

## 2019-02-11 MED ORDER — FLUCONAZOLE 150 MG PO TABS: ORAL_TABLET | ORAL | 0 refills | Status: DC

## 2019-02-11 NOTE — Progress Notes (Signed)
Subjective:     Patient ID: Barbara Odonnell, female   DOB: 1976/10/24, 43 y.o.   MRN: [id number]   Interactive audio and video telecommunications were attempted between this provider and patient, however due to patient not being able to gain access to video capability, we continued and completed visit with audio only.  The patient was located at home. The provider was located in the office. The patient did consent to this visit and is aware of possible charges through their insurance for this visit.  The other persons participating in this telemedicine service were none. Time spent on call was 15 minutes and in review of previous records >15 minutes total.  This virtual service is not related to other E/M service within previous 7 days.  HPI Chief Complaint  Patient presents with  . follow-up    zoom virtual- feeling worse than last week, and having headaches, yeast infection from Antibitoic.    Virtual visit today to discuss symptoms.  We did a televisit a week ago for asthma and allergies, asthma flared up from asthma as well as sinus infection symptoms.   Its been a week.  She reports that her breathing is better, not having the shortness of breath and wheezing she had a week ago.  Still using Symbicort twice daily as directed, using albuterol some but not like she was a week ago.  Her main concern is still general weakness and fatigue.  She will have a day or 2 where she feels good, but will have a day or 2 where she feels exhausted.  The last time she had felt like this was with norovirus several years ago.  She just feels like it is taking longer for her to get back to baseline  She has ongoing headache, some chills, some body aches, temp up to 99 at times,, no difference in her sinus pressure than a week ago.  She is on day 8 of her antibiotic Augmentin  She is taking Mucinex and probiotic that was recommended last week  She has developed a yeast infection being on the  antibiotic.  She took an old Diflucan she had from prior.  She is on NuvaRing, no concern for pregnancy.  No palpitations of the heart, no syncope.  Her resting pulse is running in the 60 to 70s.  No history of DVT or PE.  No calf pain or swelling.  She is compliant with the medications listed however she does use her thyroid medicine along with Wellbutrin and 1 of the medicine in the mornings, not thyroid medicine by itself.  No other aggravating or relieving factors. No other complaint.   Past Medical History:  Diagnosis Date  . Allergy   . Anemia    IV iron in past, prior to 2018  . Asthma   . Celiac disease    +prior biopsy  . GERD (gastroesophageal reflux disease)   . H/O bone density study 12/21/2017   normal  . Hip fracture ([place]) 2008   right, Dr. Oneida Alar  . History of stress test [address]  . Hypothyroidism   . Migraine headache without aura    gets bad nausea.  does well on Naratriptan and Relpax.  Failed Imitrex, Maxalt  . Mood change    Wellbutrin, has used Zoloft prior  . Stress fracture    severeral prior  . Transfusion history    has had IV iron prior, Dr. Marin Olp prior management  . Wears contact lenses  Current Outpatient Medications on File Prior to Visit  Medication Sig Dispense Refill  . albuterol (PROVENTIL HFA;VENTOLIN HFA) 108 (90 Base) MCG/ACT inhaler INHALE 2 PUFFS EVERY 4 HOURS 18 Inhaler 0  . amoxicillin-clavulanate (AUGMENTIN) 875-125 MG tablet Take 1 tablet by mouth 2 (two) times daily. 20 tablet 0  . budesonide-formoterol (SYMBICORT) 160-4.5 MCG/ACT inhaler INHALE 2 PUFFS INTO THE LUNGS 2 (TWO) TIMES DAILY. 10.2 Inhaler 3  . buPROPion (WELLBUTRIN XL) 150 MG 24 hr tablet Take 1 tablet (150 mg total) by mouth daily. 90 tablet 0  . [facility] D3 25 MCG (1000 UT) capsule TAKE 1 CAPSULE BY MOUTH EVERY DAY 90 capsule 0  . etonogestrel-ethinyl estradiol (NUVARING) 0.12-0.015 MG/24HR vaginal ring Place 1 each vaginally every 28 (twenty-eight) days.  Insert vaginally and leave in place for 3 consecutive weeks, then remove for 1 week.    . FeAspGl-FeFum-B12-FA-C-Succ Ac (FERREX 28) MISC Take 1 tablet by mouth daily. 90 each 0  . naratriptan (AMERGE) 2.5 MG tablet TAKE 1 TABLET AS NEEDED FOR MIGRAINE, MAY REPEAT AFTER 4 HOURS (MAX 5MG IN 24HRS) 30 tablet 2  . ondansetron (ZOFRAN) 4 MG tablet Take 1 tablet (4 mg total) by mouth every 8 (eight) hours as needed for nausea or vomiting. 20 tablet 0  . RABEprazole (ACIPHEX) 20 MG tablet TAKE 1 TABLET BY MOUTH EVERY DAY 90 tablet 0  . Spacer/Aero-Holding Chambers (AEROCHAMBER MV) inhaler Use as instructed 1 each 0  . SYNTHROID 50 MCG tablet TAKE 1 TABLET (50 MCG TOTAL) BY MOUTH ONCE DAILY 90 tablet 0   No current facility-administered medications on file prior to visit.     Review of Systems As in subjective     Objective:   Physical Exam Pulse 61   Temp 99.1 F (37.3 C)   Wt 155 lb (70.3 kg)   LMP 01/14/2019 (Exact Date)   SpO2 98%   BMI 24.28 kg/m  Wt Readings from Last 3 Encounters:  02/11/19 155 lb (70.3 kg)  02/04/19 155 lb (70.3 kg)  10/16/18 156 lb (70.8 kg)   Due to coronavirus pandemic stay at home measures, patient visit was virtual and they were not examined in person.  Gen: wd, wn, nad       Assessment:     Encounter Diagnoses  Name Primary?  . Allergic rhinitis due to pollen, unspecified seasonality Yes  . Mild persistent asthma without complication   . Hypothyroidism, unspecified type   . Other fatigue   . Nonintractable headache, unspecified chronicity pattern, unspecified headache type   . Yeast vaginitis        Plan:     We discussed her symptoms and concerns.  We discussed the limitations of a virtual visit  When I spoke to her last week she had symptoms of asthma flareup from allergies along with what sounds like a developing sinus infection.  She has had no COVID-19 exposures, no recent travel, works from home.  She has main concern of fatigue today,  not getting energy back as quickly as she thinks she should  For now we will have her finish out the Augmentin, continue Symbicort 2 puffs twice a day, continue albuterol as needed.  Advise continue rest, hydration, gave updated work note out through Wednesday of this week given her weakness  I will have her come in for labs.  We will meet her at the door with a mask and get her in quickly for lab draw only just to help rule out other issues  given the weakness and fatigue and history of thyroid medicine.  Her last labs were over a year ago  Yeast vaginiits secondary to recent antibiotic use.  Begin diflucan.   Christella was seen today for follow-up.  Diagnoses and all orders for this visit:  Allergic rhinitis due to pollen, unspecified seasonality  Mild persistent asthma without complication  Hypothyroidism, unspecified type -     TSH -     T4, free  Other fatigue -     CBC with Differential/Platelet -     Comprehensive metabolic panel -     TSH -     T4, free  Nonintractable headache, unspecified chronicity pattern, unspecified headache type  Yeast vaginitis  Other orders -     fluconazole (DIFLUCAN) 150 MG tablet; Take 1, can repeat in 1 week  f/u for lab visit today

## 2019-02-11 NOTE — Telephone Encounter (Signed)
Odonnell wants to know if you would add on an Barbara Odonnell test to her labwork.

## 2019-02-11 NOTE — Telephone Encounter (Signed)
error 

## 2019-02-11 NOTE — Patient Instructions (Addendum)
Recommendations  Asthma and allergies  Continue Symbicort 2 puffs twice a day for prevention  Continue albuterol rescue inhaler as needed every 4-6 hours  Continue over-the-counter antihistamine such as Zyrtec or Allegra or Xyzal  I recommend use a peak flow meter to monitor your asthma symptoms, and see handout below  Sinus infection  Finish Augmentin antibiotic  Hydrate well with water throughout the day   Hypothyroidism   Your thyroid medicine should be taken by itself an hour before breakfast on an empty stomach, and before other medications.  Resume the rest of your morning medications at breakfast time   Yeast infection  Begin Diflucan 1 tablet weekly until symptoms improve  We will check labs today to rule out other issues such as anemia, abnormalities of your blood counts, we will check your electrolytes and thyroid as well  We will call back tomorrow morning with lab results    I wrote you an updated work note today out through Wednesday to give you time to recuperate     [place] I gave you a peak flow meter prescription today.   On a day when your symptoms are completley at baseline/normal, blow into the peak flow meter forceably for for 5 seconds.  Do this 3 times.   Take the average number, and this is you "personal best."  Write this number on the Asthma Action Plan at the top.    In the green, yellow, and red zones, calculate 80%, 50-80%, and 50%, respectively for the 3 columns to fine your zones.    For example, you get 300, 400, 500 for each of 3 tries.  Your personal best is 400.   Thus, in the green zone, 80% of personal best would be a peak flow reading of 320 or greater.   50-80% in the yellow zone, would be 200-320.   Red zone would be less than 200.   Thus, on a day when symptoms are present, check your peak flow reading, and look at the appropriate zone to determine the course of action.     Peak Flow Meter For people with asthma or  certain other breathing problems, a peak flow meter is a simple but important tool to help with daily asthma management. A peak flow meter is an easy-to-use device that measures how well your lungs are working. Peak flow meters are available over-the-counter. The readings from the meter will help you and your caregiver:   Determine the severity of your asthma.  Evaluate the effectiveness of your current treatment.  Determine when to add or stop certain medicines.  Recognize an asthma attack before signs or symptoms appear.  Decide when to seek emergency care. Your "personal best" Your "personal best" is the highest peak flow rate you can reach over a 2-3 week period when you feel good and have no asthma symptoms. Your flow rate serves as a benchmark in your daily self-management plan. Because everyone's asthma is different, your personal best will be unique to you.  Your caregiver will help you figure out your personal best. Typically, you will take readings once or twice a day for 2 weeks when you are not having symptoms. The highest consistent reading during the trial period is your "personal best."  INSTRUCTIONS FOR USE  3. Move the upper marker to the number that is your "personal best." 4. Move the lower marker to the bottom of the numbered scale. 5. Connect the mouthpiece to the peak flow meter. 6. Stand  up. 7. Take a deep breath. Fill your lungs completely. 8. Place your lips tightly around the mouthpiece. Blow as hard and as fast as you can with a single breath. 9. Note the final position of the lower marker. This is your peak flow rate. 10. Move the lower marker back to the bottom of the numbered scale. 11. Repeat these steps 2 more times. Record the highest reading of the 3 tries in your asthma diary. For the most accurate readings, it is important to keep your peak flow meter clean. Follow the manufacturer's instructions on how to take care of your peak flow meter.  RESULTS You  and your caregiver can use color coded "zones" on the meter to see how your peak flow rate compares to your "personal best." The color code for each zone reflects progressively more severe symptoms:  Green zone = stable   Your peak flow rates are 80 to 100 percent of your personal best. This means that your asthma is under control.  You probably have no asthma signs or symptoms.  Take your preventive medications as usual. Yellow zone = caution   Your peak flow rates are 50 to 80 percent of your personal best. This means that your asthma is getting worse and could be improved.  You may have signs and symptoms such as coughing, wheezing or chest tightness. However, your peak flow rates may decrease before symptoms appear.  You may need to increase or change your asthma medication. Red zone = danger   Your peak flow rates are less than 50 percent of your personal best. This means that you may be in danger of a medical emergency.  You may have severe coughing, wheezing and shortness of breath. Stop whatever you are doing. Use your rescue inhaler (for example, albuterol) or other medicines to open your airways.  Your asthma action plan will help you decide whether to call your caregiver or seek emergency care. If your flow readings fall too far below your personal best into the yellow or red zone you'll need to take action to prevent or minimize an asthma attack.  RISKS AND COMPLICATIONS  Breathing too quickly may cause dizziness. At an extreme, this could cause you to pass out. Take your time so you do not get dizzy or light-headed.  If your lips are not placed tightly around the peak flow meter mouthpiece, you will get incorrect low readings. AFTER USE  Rest and breathe slowly and easily.  Keep a record of your progress. Your caregiver can provide you with a simple table to help with this. WHEN TO USE Your caregiver will give you instructions on when to do regular monitoring. You may  also need to check your peak flow when:   Asthma symptoms wake you up at night.  You have increased symptoms during the day.  You have a cold, flu or other illness that affects your breathing.  You need quick relief "rescue medication." (If possible, check your peak flow before you use rescue medication. Check it again 20 or 30 minutes after taking medication.) Document Released: 08/14/2007 Document Revised: 01/09/2012 Document Reviewed: 01/10/2008 [facility] Patient Information 2013 [place].

## 2019-02-11 NOTE — Progress Notes (Signed)
Breathing better, Thursday felt good took a walk, but couldn't get out of bed next day. 171.  Sunday yesterday exhausted, fatigue in brain and body, out of breath feeling has improved.  Headache daily, chills, achy, slight fever at night   4/6 a week ago, sinus infection, white mucous still.  No difference in sinus pressure.  On day 8 of antigibvoitc  Albuterol currently 3 -4 times daily, symbicort, mucinex, , using probioitic,   Yeast infection, used old yeast infection pilll.  Nuvaring.   61 this morning.  90s.   Coughing fit can go to 112.   No pain or swelling calves.   No hx/o DVT or PE.    norovirus 2009, overwhelming exhausted.     Feels better in spurts  Yeast pill, Diflucan 176m,

## 2019-02-11 NOTE — Addendum Note (Signed)
Addended by: Carlena Hurl on: 02/11/2019 12:18 PM   Modules accepted: Orders

## 2019-02-12 ENCOUNTER — Telehealth: Payer: Self-pay | Admitting: Medical

## 2019-02-12 LAB — CBC WITH DIFFERENTIAL/PLATELET
Basophils Absolute: 0.1 10*3/uL (ref 0.0–0.2)
Basos: 2 %
EOS (ABSOLUTE): 0.7 10*3/uL — ABNORMAL HIGH (ref 0.0–0.4)
Eos: 14 %
Hematocrit: 48.5 % — ABNORMAL HIGH (ref 34.0–46.6)
Hemoglobin: 16.2 g/dL — ABNORMAL HIGH (ref 11.1–15.9)
Immature Grans (Abs): 0 10*3/uL (ref 0.0–0.1)
Immature Granulocytes: 0 %
Lymphocytes Absolute: 1.4 10*3/uL (ref 0.7–3.1)
Lymphs: 26 %
MCH: 31.5 pg (ref 26.6–33.0)
MCHC: 33.4 g/dL (ref 31.5–35.7)
MCV: 94 fL (ref 79–97)
Monocytes Absolute: 0.4 10*3/uL (ref 0.1–0.9)
Monocytes: 7 %
Neutrophils Absolute: 2.7 10*3/uL (ref 1.4–7.0)
Neutrophils: 51 %
Platelets: 223 10*3/uL (ref 150–450)
RBC: 5.14 x10E6/uL (ref 3.77–5.28)
RDW: 11.8 % (ref 11.7–15.4)
WBC: 5.3 10*3/uL (ref 3.4–10.8)

## 2019-02-12 LAB — COMPREHENSIVE METABOLIC PANEL
ALT: 14 IU/L (ref 0–32)
AST: 18 IU/L (ref 0–40)
Albumin/Globulin Ratio: 1.7 (ref 1.2–2.2)
Albumin: 4.2 g/dL (ref 3.8–4.8)
Alkaline Phosphatase: 43 IU/L (ref 39–117)
BUN/Creatinine Ratio: 9 (ref 9–23)
BUN: 8 mg/dL (ref 6–24)
Bilirubin Total: 0.4 mg/dL (ref 0.0–1.2)
CO2: 21 mmol/L (ref 20–29)
Calcium: 9.8 mg/dL (ref 8.7–10.2)
Chloride: 102 mmol/L (ref 96–106)
Creatinine, Ser: 0.9 mg/dL (ref 0.57–1.00)
GFR calc Af Amer: 89 mL/min/{1.73_m2} (ref >59)
GFR calc non Af Amer: 77 mL/min/{1.73_m2} (ref >59)
Globulin, Total: 2.5 g/dL (ref 1.5–4.5)
Glucose: 82 mg/dL (ref 65–99)
Potassium: 4.7 mmol/L (ref 3.5–5.2)
Sodium: 138 mmol/L (ref 134–144)
Total Protein: 6.7 g/dL (ref 6.0–8.5)

## 2019-02-12 LAB — T4, FREE: Free T4: 1.35 ng/dL (ref 0.82–1.77)

## 2019-02-12 LAB — TSH: TSH: 2.44 u[IU]/mL (ref 0.450–4.500)

## 2019-02-12 NOTE — Telephone Encounter (Signed)
  Please call  She has questions about labs

## 2019-02-14 LAB — EPSTEIN BARR VRS(EBV DNA BY PCR): Epstein-Barr DNA Quant, PCR: NEGATIVE copies/mL

## 2019-02-15 ENCOUNTER — Other Ambulatory Visit: Payer: Self-pay | Admitting: Medical

## 2019-02-15 MED ORDER — CLARITHROMYCIN 500 MG PO TABS: 500.0000 mg | ORAL_TABLET | Freq: Two times a day (BID) | ORAL | 0 refills | Status: DC

## 2019-02-18 ENCOUNTER — Ambulatory Visit (HOSPITAL_COMMUNITY)
Admission: EM | Admit: 2019-02-18 | Discharge: 2019-02-18 | Disposition: A | Discharge status: Home / Self Care | Payer: BLUE CROSS/BLUE SHIELD | Attending: Family Medicine | Admitting: Family Medicine

## 2019-02-18 ENCOUNTER — Other Ambulatory Visit: Payer: Self-pay

## 2019-02-18 ENCOUNTER — Encounter (HOSPITAL_COMMUNITY): Payer: Self-pay

## 2019-02-18 ENCOUNTER — Ambulatory Visit (INDEPENDENT_AMBULATORY_CARE_PROVIDER_SITE_OTHER): Payer: BLUE CROSS/BLUE SHIELD

## 2019-02-18 DIAGNOSIS — R0602 Shortness of breath: Secondary | ICD-10-CM

## 2019-02-18 DIAGNOSIS — R5383 Other fatigue: Secondary | ICD-10-CM

## 2019-02-18 NOTE — Discharge Instructions (Signed)
No alarming signs on exam. Your chest xray does not show any acute illness. EKG unchanged from prior. Continue clarithromycin. Keep hydrated, urine should be clear to pale yellow in color. Continue to monitor symptoms at this time. If experiencing worsening symptoms, cannot talk in full sentences, chest pain, passing out, go to the emergency department for further evaluation. Otherwise, follow up with PCP for further monitoring needed.

## 2019-02-18 NOTE — ED Triage Notes (Signed)
Symptomatic for past 3 weeks, completed augmentin for 10 days, states has difficult time catching breath both at rest and with exertion

## 2019-02-18 NOTE — ED Provider Notes (Signed)
[facility] [facility]    CSN: [id number] Arrival date & time: 02/18/19  1431     History   Chief Complaint Chief Complaint  Patient presents with   Shortness of Breath    HPI Barbara Odonnell is a 43 y.o. female.   43 year old female comes in for 3 week history of URI symptoms. First started out with rhinorrhea, nasal congestion, cough, sinus pressure and was started on augmentin for possible bacterial sinusitis. States she has had subjective fever with Tmax of 100.3. She had a period of time with fatigue, shortness of breath, lightheadedness that has since improved. She finished augmentin on 02/13/2019 and was started on clarithromycin on 02/16/2019 for continued sinus pressure. Currently symptoms includes dyspnea on exertion, occasional palpitations. She still has mild fatigue that has improved throughout the past few weeks. She denies dizziness and lightheadedness for the past few days. Denies current cough, rhinorrhea, nasal congestion. She occasionally has wheezing to the upper airway/throat, but denies asthma like wheezing. Denies chest pain. Never smoker. Patient worries for her heart rate, stating her resting is in the 50s given history as a runner, but now heart rate is in the 90s.      Past Medical History:  Diagnosis Date   Allergy    Anemia    IV iron in past, prior to 2018   Asthma    Celiac disease    +prior biopsy   GERD (gastroesophageal reflux disease)    H/O bone density study 12/21/2017   normal   Hip fracture ([place]) 2008   right, Dr. Oneida Alar   History of stress test 2014   [facility]   Hypothyroidism    Migraine headache without aura    gets bad nausea.  does well on Naratriptan and Relpax.  Failed Imitrex, Maxalt   Mood change    Wellbutrin, has used Zoloft prior   Stress fracture    severeral prior   Transfusion history    has had IV iron prior, Dr. Marin Olp prior management   Wears contact lenses     Patient Active Problem List   Diagnosis Date Noted   Nonintractable headache 02/11/2019   Other fatigue 02/11/2019   Yeast vaginitis 02/11/2019   Allergic rhinitis due to pollen 02/04/2019   Nausea and vomiting 09/21/2018   Acute pain of right knee 08/23/2018   Pes anserinus bursitis of left knee 08/23/2018   Family history of osteoporosis 11/28/2017   History of fracture 11/28/2017   Needs flu shot 11/24/2016   Urine findings abnormal 11/24/2016   Adjustment disorder with mixed anxiety and depressed mood 11/24/2016   Mild persistent asthma 04/08/2016   GERD (gastroesophageal reflux disease) 04/08/2016   Hypothyroidism 10/08/2015   Chronic migraine without aura without status migrainosus, not intractable 10/08/2015   Palpitations 10/08/2015   Elevated blood-pressure reading without diagnosis of hypertension 10/08/2015   Mood change 10/08/2015   Encounter for health maintenance examination in adult 10/08/2015   Celiac disease 10/08/2015   Vitamin D deficiency 10/08/2015   Iron deficiency anemia 10/08/2015   LEG PAIN, BILATERAL 12/11/2007   SHIN SPLINTS 10/05/2007   HIP FRACTURE, RIGHT 05/08/2007   HIP PAIN, RIGHT 02/09/2007   INGUINAL PAIN, RIGHT 02/09/2007    Past Surgical History:  Procedure Laterality Date   COLONOSCOPY  2015   [facility], advised to repeat q5 years; x 4 prior   ESOPHAGOGASTRODUODENOSCOPY     x5 prior as of 2016   [place]  left   NASAL SEPTUM SURGERY     POLYPECTOMY     sigmoid colon   SINOSCOPY      OB History   No obstetric history on file.      Home Medications    Prior to Admission medications   Medication Sig Start Date End Date Taking? Authorizing Provider  albuterol (PROVENTIL HFA;VENTOLIN HFA) 108 (90 Base) MCG/ACT inhaler INHALE 2 PUFFS EVERY 4 HOURS 01/18/19  Yes Tysinger, Camelia Eng, PA-C  budesonide-formoterol (SYMBICORT) 160-4.5 MCG/ACT inhaler INHALE 2 PUFFS INTO THE LUNGS 2 (TWO) TIMES DAILY. 01/23/19  Yes  Tysinger, Camelia Eng, PA-C  buPROPion (WELLBUTRIN XL) 150 MG 24 hr tablet Take 1 tablet (150 mg total) by mouth daily. 02/04/19  Yes Tysinger, Camelia Eng, PA-C  clarithromycin (BIAXIN) 500 MG tablet Take 1 tablet (500 mg total) by mouth 2 (two) times daily. 02/15/19  Yes Tysinger, Camelia Eng, PA-C  [employer] D3 25 MCG (1000 UT) capsule TAKE 1 CAPSULE BY MOUTH EVERY DAY 12/13/18  Yes Tysinger, Camelia Eng, PA-C  ondansetron (ZOFRAN) 4 MG tablet Take 1 tablet (4 mg total) by mouth every 8 (eight) hours as needed for nausea or vomiting. 09/21/18  Yes Tysinger, Camelia Eng, PA-C  RABEprazole (ACIPHEX) 20 MG tablet TAKE 1 TABLET BY MOUTH EVERY DAY 12/13/18  Yes Tysinger, Camelia Eng, PA-C  SYNTHROID 50 MCG tablet TAKE 1 TABLET (50 MCG TOTAL) BY MOUTH ONCE DAILY 01/23/19  Yes Tysinger, Camelia Eng, PA-C  etonogestrel-ethinyl estradiol (NUVARING) 0.12-0.015 MG/24HR vaginal ring Place 1 each vaginally every 28 (twenty-eight) days. Insert vaginally and leave in place for 3 consecutive weeks, then remove for 1 week.    [provider]  FeAspGl-FeFum-B12-FA-C-Succ Ac (FERREX 28) MISC Take 1 tablet by mouth daily. 08/10/18   Tysinger, Camelia Eng, PA-C  naratriptan (AMERGE) 2.5 MG tablet TAKE 1 TABLET AS NEEDED FOR MIGRAINE, MAY REPEAT AFTER 4 HOURS (MAX 5MG IN 24HRS) 10/16/18   Tysinger, Camelia Eng, PA-C  Peak Flow Meter DEVI 1 each by Does not apply route daily. 02/11/19   Tysinger, Camelia Eng, PA-C  Spacer/Aero-Holding Chambers (AEROCHAMBER MV) inhaler Use as instructed 04/08/16   Javier Glazier, MD    Family History Family History  Problem Relation Age of Onset   Hypertension Mother    Osteopenia Mother    Cancer Father        asbestos related lung cancer   Heart disease Father 78   Pulmonary embolism Father    Celiac disease Brother    Celiac disease Brother    Stroke Maternal Grandmother    COPD Maternal Grandmother    Osteopenia Maternal Grandmother    Heart disease Maternal Grandfather    Stroke Maternal Grandfather     Heart disease Paternal Grandfather        died 76yo   Diabetes Neg Hx     Social History Social History   Tobacco Use   Smoking status: Never Smoker   Smokeless tobacco: Never Used  Substance Use Topics   Alcohol use: No    Alcohol/week: 1.0 standard drinks    Types: 1 Glasses of wine per week    Comment: rare   Drug use: No     Allergies   Sulfa antibiotics and Singulair [montelukast sodium]   Review of Systems Review of Systems  Reason unable to perform ROS: See HPI as above.     Physical Exam Triage Vital Signs ED Triage Vitals  Enc Vitals Group     BP 02/18/19 1453 Marland Kitchen)  133/96     Pulse Rate 02/18/19 1453 100     Resp 02/18/19 1453 20     Temp 02/18/19 1453 98.3 F (36.8 C)     Temp src --      SpO2 02/18/19 1453 100 %     Weight --      Height --      Head Circumference --      Peak Flow --      Pain Score 02/18/19 1454 0     Pain Loc --      Pain Edu? --      Excl. in [place]? --    No data found.  Updated Vital Signs BP (!) 133/96    Pulse 100    Temp 98.3 F (36.8 C)    Resp 20    LMP 02/11/2019 (Exact Date)    SpO2 100%   Physical Exam Constitutional:      General: She is not in acute distress.    Appearance: Normal appearance. She is not ill-appearing, toxic-appearing or diaphoretic.  HENT:     Head: Normocephalic and atraumatic.     Mouth/Throat:     Mouth: Mucous membranes are moist.     Pharynx: Oropharynx is clear. Uvula midline.  Neck:     Musculoskeletal: Normal range of motion and neck supple.  Cardiovascular:     Rate and Rhythm: Normal rate and regular rhythm.     Heart sounds: Normal heart sounds. No murmur. No friction rub. No gallop.   Pulmonary:     Effort: Pulmonary effort is normal. No accessory muscle usage, prolonged expiration, respiratory distress or retractions.     Comments: Speaking in full sentences without difficulty. Lungs clear to auscultation without adventitious lung sounds. Neurological:     General:  No focal deficit present.     Mental Status: She is alert and oriented to person, place, and time.      UC Treatments / Results  Labs (all labs ordered are listed, but only abnormal results are displayed) Labs Reviewed - No data to display  EKG None  Radiology Dg Chest 2 View  Result Date: 02/18/2019 CLINICAL DATA:  Shortness of breath EXAM: CHEST - 2 VIEW COMPARISON:  11/12/2005 FINDINGS: Cardiac shadows within normal limits. The lungs are hyperinflated bilaterally. No focal infiltrate or sizable effusion is seen. Mild degenerative changes of the thoracic spine are noted. IMPRESSION: COPD without acute abnormality. Electronically Signed   By: Inez Catalina M.D.   On: 02/18/2019 16:12    Procedures Procedures (including critical care time)  Medications Ordered in UC Medications - No data to display  Initial Impression / Assessment and Plan / UC Course  I have reviewed the triage vital signs and the nursing notes.  Pertinent labs & imaging results that were available during my care of the patient were reviewed by me and considered in my medical decision making (see chart for details).    Patient requesting CXR and EKG for current symptoms, will obtain for further evaluation needed.   EKG NSR, 85bpm, right atrial enlargement, unchanged from prior EKG. CXR shows hyperinflation without acute abnormality. Patient able to peak in full sentences without difficulty. Afebrile without antipyretics. Without tachycardia, tachypnea, O2 sat 100%. LCTAB. Will have patient continue clarithromycin. Continue symptomatic treatment and monitor closely. Return precautions given. Patient expresses understanding and agrees to plan.  Final Clinical Impressions(s) / UC Diagnoses   Final diagnoses:  SOB (shortness of breath)  Fatigue, unspecified type  ED Prescriptions    None        Ok Edwards, PA-C 02/18/19 1829

## 2019-02-20 ENCOUNTER — Other Ambulatory Visit: Payer: Self-pay | Admitting: Medical

## 2019-02-20 MED ORDER — PROAIR HFA 108 (90 BASE) MCG/ACT IN AERS: 2.0000 | INHALATION_SPRAY | Freq: Four times a day (QID) | RESPIRATORY_TRACT | 2 refills | Status: DC | PRN

## 2019-02-25 ENCOUNTER — Encounter: Payer: BLUE CROSS/BLUE SHIELD | Admitting: Medical

## 2019-02-27 ENCOUNTER — Encounter: Payer: Self-pay | Admitting: Medical

## 2019-02-27 ENCOUNTER — Other Ambulatory Visit: Payer: Self-pay

## 2019-02-27 ENCOUNTER — Encounter (HOSPITAL_COMMUNITY): Payer: Self-pay

## 2019-02-27 ENCOUNTER — Ambulatory Visit: Payer: BLUE CROSS/BLUE SHIELD | Admitting: Medical

## 2019-02-27 ENCOUNTER — Ambulatory Visit (INDEPENDENT_AMBULATORY_CARE_PROVIDER_SITE_OTHER): Payer: BLUE CROSS/BLUE SHIELD

## 2019-02-27 ENCOUNTER — Emergency Department (HOSPITAL_COMMUNITY): Payer: BLUE CROSS/BLUE SHIELD

## 2019-02-27 ENCOUNTER — Encounter (HOSPITAL_COMMUNITY): Payer: Self-pay | Admitting: Emergency Medicine

## 2019-02-27 ENCOUNTER — Emergency Department (HOSPITAL_COMMUNITY)
Admission: EM | Admit: 2019-02-27 | Discharge: 2019-02-27 | Disposition: A | Discharge status: Home / Self Care | Payer: BLUE CROSS/BLUE SHIELD | Attending: Emergency Medicine | Admitting: Emergency Medicine

## 2019-02-27 ENCOUNTER — Ambulatory Visit (HOSPITAL_COMMUNITY)
Admission: EM | Admit: 2019-02-27 | Discharge: 2019-02-27 | Disposition: A | Discharge status: Home / Self Care | Payer: BLUE CROSS/BLUE SHIELD | Source: Home / Self Care | Attending: Family Medicine | Admitting: Family Medicine

## 2019-02-27 VITALS — Wt 155.0 lb

## 2019-02-27 DIAGNOSIS — E039 Hypothyroidism, unspecified: Secondary | ICD-10-CM | POA: Diagnosis not present

## 2019-02-27 DIAGNOSIS — R0600 Dyspnea, unspecified: Secondary | ICD-10-CM | POA: Diagnosis not present

## 2019-02-27 DIAGNOSIS — J4530 Mild persistent asthma, uncomplicated: Secondary | ICD-10-CM

## 2019-02-27 DIAGNOSIS — R0602 Shortness of breath: Secondary | ICD-10-CM

## 2019-02-27 DIAGNOSIS — R0609 Other forms of dyspnea: Secondary | ICD-10-CM | POA: Insufficient documentation

## 2019-02-27 DIAGNOSIS — J45909 Unspecified asthma, uncomplicated: Secondary | ICD-10-CM | POA: Diagnosis not present

## 2019-02-27 DIAGNOSIS — R0789 Other chest pain: Secondary | ICD-10-CM

## 2019-02-27 DIAGNOSIS — Z79899 Other long term (current) drug therapy: Secondary | ICD-10-CM | POA: Insufficient documentation

## 2019-02-27 DIAGNOSIS — R5383 Other fatigue: Secondary | ICD-10-CM

## 2019-02-27 DIAGNOSIS — R7981 Abnormal blood-gas level: Secondary | ICD-10-CM | POA: Insufficient documentation

## 2019-02-27 DIAGNOSIS — Z20828 Contact with and (suspected) exposure to other viral communicable diseases: Secondary | ICD-10-CM | POA: Diagnosis not present

## 2019-02-27 LAB — BASIC METABOLIC PANEL
Anion gap: 11 (ref 5–15)
Anion gap: 8 (ref 5–15)
BUN: 10 mg/dL (ref 6–20)
BUN: 9 mg/dL (ref 6–20)
CO2: 21 mmol/L — ABNORMAL LOW (ref 22–32)
CO2: 26 mmol/L (ref 22–32)
Calcium: 9.4 mg/dL (ref 8.9–10.3)
Calcium: 9.2 mg/dL (ref 8.9–10.3)
Chloride: 105 mmol/L (ref 98–111)
Chloride: 103 mmol/L (ref 98–111)
Creatinine, Ser: 0.98 mg/dL (ref 0.44–1.00)
Creatinine, Ser: 0.96 mg/dL (ref 0.44–1.00)
GFR calc Af Amer: >60 mL/min (ref >60)
GFR calc Af Amer: >60 mL/min (ref >60)
GFR calc non Af Amer: >60 mL/min (ref >60)
GFR calc non Af Amer: >60 mL/min (ref >60)
Glucose, Bld: 90 mg/dL (ref 70–99)
Glucose, Bld: 92 mg/dL (ref 70–99)
Potassium: 5 mmol/L (ref 3.5–5.1)
Potassium: 3.8 mmol/L (ref 3.5–5.1)
Sodium: 137 mmol/L (ref 135–145)
Sodium: 137 mmol/L (ref 135–145)

## 2019-02-27 LAB — CBC WITH DIFFERENTIAL/PLATELET
Abs Immature Granulocytes: 0.01 10*3/uL (ref 0.00–0.07)
Basophils Absolute: 0.1 10*3/uL (ref 0.0–0.1)
Basophils Relative: 1 %
Eosinophils Absolute: 0.6 10*3/uL — ABNORMAL HIGH (ref 0.0–0.5)
Eosinophils Relative: 12 %
HCT: 46.4 % — ABNORMAL HIGH (ref 36.0–46.0)
Hemoglobin: 15.9 g/dL — ABNORMAL HIGH (ref 12.0–15.0)
Immature Granulocytes: 0 %
Lymphocytes Relative: 28 %
Lymphs Abs: 1.4 10*3/uL (ref 0.7–4.0)
MCH: 31.6 pg (ref 26.0–34.0)
MCHC: 34.3 g/dL (ref 30.0–36.0)
MCV: 92.2 fL (ref 80.0–100.0)
Monocytes Absolute: 0.5 10*3/uL (ref 0.1–1.0)
Monocytes Relative: 10 %
Neutro Abs: 2.5 10*3/uL (ref 1.7–7.7)
Neutrophils Relative %: 49 %
Platelets: 227 10*3/uL (ref 150–400)
RBC: 5.03 MIL/uL (ref 3.87–5.11)
RDW: 11.9 % (ref 11.5–15.5)
WBC: 5 10*3/uL (ref 4.0–10.5)
nRBC: 0 % (ref 0.0–0.2)

## 2019-02-27 LAB — I-STAT BETA HCG BLOOD, ED (MC, WL, AP ONLY): I-stat hCG, quantitative: <5 m[IU]/mL (ref <5)

## 2019-02-27 LAB — D-DIMER, QUANTITATIVE: D-Dimer, Quant: 0.43 ug/mL-FEU (ref 0.00–0.50)

## 2019-02-27 MED ORDER — IOHEXOL 350 MG/ML SOLN
100.0000 mL | Freq: Once | INTRAVENOUS | Status: AC | PRN
  Administered 2019-02-27: 100 mL via INTRAVENOUS

## 2019-02-27 NOTE — ED Provider Notes (Signed)
[facility] EMERGENCY DEPARTMENT Provider Note   CSN: [id number] Arrival date & time: 02/27/19  1553    History   Chief Complaint Chief Complaint  Patient presents with   Shortness of Breath    HPI Barbara Odonnell is a 43 y.o. female.     HPI  Patient presents with concern of dyspnea, particularly with exertion, activity. She is a notable history of presumed coinfection 3 weeks ago, with somewhat classic symptoms, fatigue, nausea, discomfort, subjective fever, chills, dyspnea. She was not tested, and in general has improved substantially. However, she has had persistent dyspnea, with waxing, waning severity over the past 3 to 4 days. Symptoms are particularly worse with activity, exertion, with easy fatigability and dyspnea with minimal exertion. After seeing primary care and urgent care, she was sent here for evaluation.   Past Medical History:  Diagnosis Date   Allergy    Anemia    IV iron in past, prior to 2018   Asthma    Celiac disease    +prior biopsy   GERD (gastroesophageal reflux disease)    H/O bone density study 12/21/2017   normal   Hip fracture ([place]) 2008   right, Dr. Oneida Alar   History of stress test 2014   [facility]   Hypothyroidism    Migraine headache without aura    gets bad nausea.  does well on Naratriptan and Relpax.  Failed Imitrex, Maxalt   Mood change    Wellbutrin, has used Zoloft prior   Stress fracture    severeral prior   Transfusion history    has had IV iron prior, Dr. Marin Olp prior management   Wears contact lenses     Patient Active Problem List   Diagnosis Date Noted   DOE (dyspnea on exertion) 02/27/2019   Abnormal pulse oximetry 02/27/2019   Nonintractable headache 02/11/2019   Other fatigue 02/11/2019   Yeast vaginitis 02/11/2019   Allergic rhinitis due to pollen 02/04/2019   Nausea and vomiting 09/21/2018   Acute pain of right knee 08/23/2018   Pes anserinus bursitis of  left knee 08/23/2018   Family history of osteoporosis 11/28/2017   History of fracture 11/28/2017   Needs flu shot 11/24/2016   Urine findings abnormal 11/24/2016   Adjustment disorder with mixed anxiety and depressed mood 11/24/2016   Mild persistent asthma 04/08/2016   GERD (gastroesophageal reflux disease) 04/08/2016   Hypothyroidism 10/08/2015   Chronic migraine without aura without status migrainosus, not intractable 10/08/2015   Palpitations 10/08/2015   Elevated blood-pressure reading without diagnosis of hypertension 10/08/2015   Mood change 10/08/2015   Encounter for health maintenance examination in adult 10/08/2015   Celiac disease 10/08/2015   Vitamin D deficiency 10/08/2015   Iron deficiency anemia 10/08/2015   LEG PAIN, BILATERAL 12/11/2007   SHIN SPLINTS 10/05/2007   HIP FRACTURE, RIGHT 05/08/2007   HIP PAIN, RIGHT 02/09/2007   INGUINAL PAIN, RIGHT 02/09/2007    Past Surgical History:  Procedure Laterality Date   COLONOSCOPY  2015   [facility], advised to repeat q5 years; x 4 prior   ESOPHAGOGASTRODUODENOSCOPY     x5 prior as of 2016   [place]     left   NASAL SEPTUM SURGERY     POLYPECTOMY     sigmoid colon   SINOSCOPY       OB History   No obstetric history on file.      Home Medications    Prior to Admission medications  Medication Sig Start Date End Date Taking? Authorizing Provider  budesonide-formoterol (SYMBICORT) 160-4.5 MCG/ACT inhaler INHALE 2 PUFFS INTO THE LUNGS 2 (TWO) TIMES DAILY. Patient taking differently: Inhale 2 puffs into the lungs 2 (two) times a day.  01/23/19  Yes Tysinger, Camelia Eng, PA-C  buPROPion (WELLBUTRIN XL) 150 MG 24 hr tablet Take 1 tablet (150 mg total) by mouth daily. 02/04/19  Yes Tysinger, Camelia Eng, PA-C  [employer]S D3 25 MCG (1000 UT) capsule TAKE 1 CAPSULE BY MOUTH EVERY DAY Patient taking differently: Take 1,000 Units by mouth daily.  12/13/18  Yes Tysinger, Camelia Eng, PA-C    etonogestrel-ethinyl estradiol (NUVARING) 0.12-0.015 MG/24HR vaginal ring Place 1 each vaginally every 28 (twenty-eight) days. Insert vaginally and leave in place for 3 consecutive weeks, then remove for 1 week.   Yes [provider]  FeAspGl-FeFum-B12-FA-C-Succ Ac (FERREX 28) MISC Take 1 tablet by mouth daily. 08/10/18  Yes Tysinger, Camelia Eng, PA-C  naratriptan (AMERGE) 2.5 MG tablet TAKE 1 TABLET AS NEEDED FOR MIGRAINE, MAY REPEAT AFTER 4 HOURS (MAX 5MG IN 24HRS) Patient taking differently: Take 2.5 mg by mouth as needed for migraine. MAY REPEAT AFTER 4 HOURS (MAX 5MG IN 24HRS) 10/16/18  Yes Tysinger, Camelia Eng, PA-C  ondansetron (ZOFRAN) 4 MG tablet Take 1 tablet (4 mg total) by mouth every 8 (eight) hours as needed for nausea or vomiting. 09/21/18  Yes Tysinger, Camelia Eng PA-C  PROAIR HFA 108 [phone] Base) MCG/ACT inhaler Inhale 2 puffs into the lungs every 6 (six) hours as needed for wheezing or shortness of breath. 02/20/19  Yes Tysinger, Camelia Eng, PA-C  RABEprazole (ACIPHEX) 20 MG tablet TAKE 1 TABLET BY MOUTH EVERY DAY Patient taking differently: Take 20 mg by mouth daily.  12/13/18  Yes Tysinger, Camelia Eng, PA-C  SYNTHROID 50 MCG tablet TAKE 1 TABLET (50 MCG TOTAL) BY MOUTH ONCE DAILY Patient taking differently: Take 50 mcg by mouth daily.  01/23/19  Yes Tysinger, Camelia Eng, PA-C  clarithromycin (BIAXIN) 500 MG tablet Take 1 tablet (500 mg total) by mouth 2 (two) times daily. Patient not taking: Reported on 02/27/2019 02/15/19   Tysinger, Camelia Eng, PA-C  Peak Flow Meter DEVI 1 each by Does not apply route daily. 02/11/19   Tysinger, Camelia Eng, PA-C  Spacer/Aero-Holding Chambers (AEROCHAMBER MV) inhaler Use as instructed 04/08/16   Javier Glazier, MD    Family History Family History  Problem Relation Age of Onset   Hypertension Mother    Osteopenia Mother    Cancer Father        asbestos related lung cancer   Heart disease Father 36   Pulmonary embolism Father    Celiac disease Brother     Celiac disease Brother    Stroke Maternal Grandmother    COPD Maternal Grandmother    Osteopenia Maternal Grandmother    Heart disease Maternal Grandfather    Stroke Maternal Grandfather    Heart disease Paternal Grandfather        died 16yo   Diabetes Neg Hx     Social History Social History   Tobacco Use   Smoking status: Never Smoker   Smokeless tobacco: Never Used  Substance Use Topics   Alcohol use: No    Alcohol/week: 1.0 standard drinks    Types: 1 Glasses of wine per week    Comment: rare   Drug use: No     Allergies   Sulfa antibiotics and Singulair [montelukast sodium]   Review of Systems Review of Systems  Constitutional:       Per HPI, otherwise negative  HENT:       Per HPI, otherwise negative  Respiratory:       Per HPI, otherwise negative  Cardiovascular:       Per HPI, otherwise negative  Gastrointestinal: Negative for vomiting.  Endocrine:       Negative aside from HPI  Genitourinary:       Neg aside from HPI   Musculoskeletal:       Per HPI, otherwise negative  Skin: Negative.   Neurological: Negative for syncope.     Physical Exam Updated Vital Signs BP 122/85 (BP Location: Right Arm)    Pulse 84    Temp 98.5 F (36.9 C) (Oral)    Resp 16    LMP 02/11/2019 (Exact Date)    SpO2 99%   Physical Exam Vitals signs and nursing note reviewed.  Constitutional:      General: She is not in acute distress.    Appearance: She is well-developed.  HENT:     Head: Normocephalic and atraumatic.  Eyes:     Conjunctiva/sclera: Conjunctivae normal.  Cardiovascular:     Rate and Rhythm: Normal rate and regular rhythm.  Pulmonary:     Effort: Pulmonary effort is normal. No respiratory distress.     Breath sounds: Normal breath sounds. No stridor.  Abdominal:     General: There is no distension.  Skin:    General: Skin is warm and dry.  Neurological:     Mental Status: She is alert and oriented to person, place, and time.      Cranial Nerves: No cranial nerve deficit.      ED Treatments / Results  Labs (all labs ordered are listed, but only abnormal results are displayed) Labs Reviewed  BASIC METABOLIC PANEL  I-STAT BETA HCG BLOOD, ED ([facility], [facility], [facility] ONLY)    EKG None  Radiology Dg Chest 2 View  Result Date: 02/27/2019 CLINICAL DATA:  Shortness of breath. EXAM: CHEST - 2 VIEW COMPARISON:  Chest x-ray dated February 26, 2019. FINDINGS: The heart size and mediastinal contours are within normal limits. Normal pulmonary vascularity. No focal consolidation, pleural effusion, or pneumothorax. No acute osseous abnormality. IMPRESSION: No active cardiopulmonary disease. Electronically Signed   By: Titus Dubin M.D.   On: 02/27/2019 11:19   Ct Angio Chest Pe W/cm &/or Wo Cm  Result Date: 02/27/2019 CLINICAL DATA:  Dyspnea on exertion shortness of breath and fatigue. EXAM: CT ANGIOGRAPHY CHEST WITH CONTRAST TECHNIQUE: Multidetector CT imaging of the chest was performed using the standard protocol during bolus administration of intravenous contrast. Multiplanar CT image reconstructions and MIPs were obtained to evaluate the vascular anatomy. CONTRAST:  120m OMNIPAQUE IOHEXOL 350 MG/ML SOLN COMPARISON:  None. FINDINGS: Cardiovascular: The heart size is normal. No substantial pericardial effusion. No thoracic aortic aneurysm. No filling defect within the opacified pulmonary arteries to suggest the presence of an acute pulmonary embolus. Mediastinum/Nodes: No mediastinal lymphadenopathy. There is no hilar lymphadenopathy. The esophagus has normal imaging features. There is no axillary lymphadenopathy. Lungs/Pleura: The central tracheobronchial airways are patent. No suspicious pulmonary nodule or mass. No focal airspace consolidation. No pulmonary edema or pleural effusion. Upper Abdomen: Unremarkable. Musculoskeletal: No worrisome lytic or sclerotic osseous abnormality. Review of the MIP images confirms the above findings.  IMPRESSION: 1. Unremarkable CTA chest. Specifically, no CT evidence for acute pulmonary embolus. No findings to explain the patient's history of shortness of breath. Electronically Signed   By: ERandall Hiss  Tery Sanfilippo M.D.   On: 02/27/2019 18:03    Procedures Procedures (including critical care time)  Medications Ordered in ED Medications  iohexol (OMNIPAQUE) 350 MG/ML injection 100 mL (100 mLs Intravenous Contrast Given 02/27/19 1724)     Initial Impression / Assessment and Plan / ED Course  I have reviewed the triage vital signs and the nursing notes.  Pertinent labs & imaging results that were available during my care of the patient were reviewed by me and considered in my medical decision making (see chart for details).        6:22 PM Patient in no distress, awake, alert, sitting upright, reading a book.  This well-appearing adult female presents with concern for dyspnea, dyspnea with exertion, after multiple outside practitioners and physical findings with concern for pulmonary embolism. Patient had recent presumed COVID infection, and thromboembolism is a known sequelae of this disease process. Here patient is awake, alert, findings were reassuring, no evidence for pulmonary embolism, reassuring vitals, labs, patient discharged in stable condition.  Final Clinical Impressions(s) / ED Diagnoses   Final diagnoses:  SOB (shortness of breath)     Carmin Muskrat, MD 02/27/19 [phone]

## 2019-02-27 NOTE — ED Provider Notes (Signed)
[place]   [id number] 02/27/19 Arrival Time: 0865  ASSESSMENT & PLAN:  1. SOB (shortness of breath)   2. Dyspnea on exertion   3. Chest tightness    Discussed ED evaluation to r/o PE. She repeatedly voices the desire to stay out of the ED secondary to COVID-19 pandemic. Would like as much done here as possible. No significant lab abnormalities. D-dimer WNL.  I have personally viewed the imaging studies ordered this visit. No acute cardiopulmonary findings. No infiltrates. No pneumothorax.  She plans to contact her PCP for follow up as needed.  Follow-up Information    Schedule an appointment as soon as possible for a visit  with Tysinger, Camelia Eng, PA-C.   Specialty:  Family Medicine Contact information: [address] [place] [address] [phone]          Agrees to proceed to the ED with any significantly worsening symptoms.  Reviewed expectations re: course of current medical issues. Questions answered. Outlined signs and symptoms indicating need for more acute intervention. Patient verbalized understanding. After Visit Summary given.   SUBJECTIVE: History from: patient. Barbara Odonnell is a 43 y.o. female who was seen here on 02/18/2019; note and workup reviewed along with correspondence with her PCP whom she had an e-visit. Sent here for further evaluation. Reports persistence of symptoms that have been present for the past few weeks including SOB, dyspnea on exertion, chest tightness, and fatigue. Occasional dry cough. Previously "very healthy"; marathon runner. Also has noted persistent HR above her normal resting HR in the 60s. No specific palpitations or chest pain reported. Chest tightness worse when she is lying on her back. No back pain. Does wheeze at times "but this feels different" than her normal wheezing patterns with asthma. Ambulatory without difficulty. No LE edema or calf swelling/pain. Birth control: nuvaring. No smoking history.  Normal PO intake without n/v.  Recent illnesses: "Feel like I have COVID-19 when this all started". Significant flu-like symptoms described. Fever: none reported over the past week. Self/OTC treatment: no specific tx reported.  Social History   Substance and Sexual Activity  Alcohol Use No  . Alcohol/week: 1.0 standard drinks  . Types: 1 Glasses of wine per week   Comment: rare  Illicit drug use: none.  ROS: As per HPI. All other systems negative.   OBJECTIVE:  Vitals:   02/27/19 1003  BP: (!) 120/93  Pulse: 100  Resp: 20  Temp: 98.2 F (36.8 C)  TempSrc: Oral  SpO2: 100%    General appearance: alert, oriented, no acute distress Eyes: PERRLA; EOMI; conjunctivae normal HENT: normocephalic; atraumatic Neck: supple with FROM Lungs: without labored respirations; CTAB without active wheezing Heart: regular rate and rhythm without murmer Chest Wall: without tenderness to palpation Abdomen: soft, non-tender; bowel sounds normal; no masses or organomegaly; no guarding or rebound tenderness Extremities: without edema; without calf swelling or tenderness; symmetrical without gross deformities Skin: warm and dry Neuro: normal gait Psychological: alert and cooperative; normal mood and affect  Labs: Results for orders placed or performed during the hospital encounter of 02/27/19  CBC with Differential  Result Value Ref Range   WBC 5.0 4.0 - 10.5 K/uL   RBC 5.03 3.87 - 5.11 MIL/uL   Hemoglobin 15.9 (H) 12.0 - 15.0 g/dL   HCT 46.4 (H) 36.0 - 46.0 %   MCV 92.2 80.0 - 100.0 fL   MCH 31.6 26.0 - 34.0 pg   MCHC 34.3 30.0 - 36.0 g/dL   RDW 11.9  11.5 - 15.5 %   Platelets 227 150 - 400 K/uL   nRBC 0.0 0.0 - 0.2 %   Neutrophils Relative % 49 %   Neutro Abs 2.5 1.7 - 7.7 K/uL   Lymphocytes Relative 28 %   Lymphs Abs 1.4 0.7 - 4.0 K/uL   Monocytes Relative 10 %   Monocytes Absolute 0.5 0.1 - 1.0 K/uL   Eosinophils Relative 12 %   Eosinophils Absolute 0.6 (H) 0.0 - 0.5 K/uL    Basophils Relative 1 %   Basophils Absolute 0.1 0.0 - 0.1 K/uL   Immature Granulocytes 0 %   Abs Immature Granulocytes 0.01 0.00 - 0.07 K/uL  Basic metabolic panel  Result Value Ref Range   Sodium 137 135 - 145 mmol/L   Potassium 5.0 3.5 - 5.1 mmol/L   Chloride 105 98 - 111 mmol/L   CO2 21 (L) 22 - 32 mmol/L   Glucose, Bld 90 70 - 99 mg/dL   BUN 10 6 - 20 mg/dL   Creatinine, Ser 0.98 0.44 - 1.00 mg/dL   Calcium 9.4 8.9 - 10.3 mg/dL   GFR calc non Af Amer >60 >60 mL/min   GFR calc Af Amer >60 >60 mL/min   Anion gap 11 5 - 15  D-dimer, quantitative  Result Value Ref Range   D-Dimer, Quant 0.43 0.00 - 0.50 ug/mL-FEU   Labs Reviewed - No data to display  Imaging: Dg Chest 2 View  Result Date: 02/27/2019 CLINICAL DATA:  Shortness of breath. EXAM: CHEST - 2 VIEW COMPARISON:  Chest x-ray dated February 26, 2019. FINDINGS: The heart size and mediastinal contours are within normal limits. Normal pulmonary vascularity. No focal consolidation, pleural effusion, or pneumothorax. No acute osseous abnormality. IMPRESSION: No active cardiopulmonary disease. Electronically Signed   By: Titus Dubin M.D.   On: 02/27/2019 11:19     Allergies  Allergen Reactions  . Sulfa Antibiotics Anaphylaxis  . Singulair [Montelukast Sodium]     Angry, irritable, out of skin feeling    Past Medical History:  Diagnosis Date  . Allergy   . Anemia    IV iron in past, prior to 2018  . Asthma   . Celiac disease    +prior biopsy  . GERD (gastroesophageal reflux disease)   . H/O bone density study 12/21/2017   normal  . Hip fracture ([place]) 2008   right, Dr. Oneida Alar  . History of stress test [address]  . Hypothyroidism   . Migraine headache without aura    gets bad nausea.  does well on Naratriptan and Relpax.  Failed Imitrex, Maxalt  . Mood change    Wellbutrin, has used Zoloft prior  . Stress fracture    severeral prior  . Transfusion history    has had IV iron prior, Dr. Marin Olp prior  management  . Wears contact lenses    Social History   Socioeconomic History  . Marital status: Single    Spouse name: Not on file  . Number of children: Not on file  . Years of education: Not on file  . Highest education level: Not on file  Occupational History  . Not on file  Social Needs  . Financial resource strain: Not on file  . Food insecurity:    Worry: Not on file    Inability: Not on file  . Transportation needs:    Medical: Not on file    Non-medical: Not on file  Tobacco Use  . Smoking status:  Never Smoker  . Smokeless tobacco: Never Used  Substance and Sexual Activity  . Alcohol use: No    Alcohol/week: 1.0 standard drinks    Types: 1 Glasses of wine per week    Comment: rare  . Drug use: No  . Sexual activity: Not on file  Lifestyle  . Physical activity:    Days per week: Not on file    Minutes per session: Not on file  . Stress: Not on file  Relationships  . Social connections:    Talks on phone: Not on file    Gets together: Not on file    Attends religious service: Not on file    Active member of club or organization: Not on file    Attends meetings of clubs or organizations: Not on file    Relationship status: Not on file  . Intimate partner violence:    Fear of current or ex partner: Not on file    Emotionally abused: Not on file    Physically abused: Not on file    Forced sexual activity: Not on file  Other Topics Concern  . Not on file  Social History Narrative   Lives alone, works professor at [employer], now counseling at [employer],  Exercise - 6-7 days per week, teaches spin at [employer], swam competitively in college, marathon runner.      [facility]:   Originally from [place]. Has also lived in [place], [place], & [place]. Currently working in counseling. No pets currently. No bird exposure. No mold or hot tub exposure. Currently has carpet in the bedroom. No draperies. No exposure to asbestos.    Family History  Problem  Relation Age of Onset  . Hypertension Mother   . Osteopenia Mother   . Cancer Father        asbestos related lung cancer  . Heart disease Father 58  . Pulmonary embolism Father   . Celiac disease Brother   . Celiac disease Brother   . Stroke Maternal Grandmother   . COPD Maternal Grandmother   . Osteopenia Maternal Grandmother   . Heart disease Maternal Grandfather   . Stroke Maternal Grandfather   . Heart disease Paternal Grandfather        died 37yo  . Diabetes Neg Hx    Past Surgical History:  Procedure Laterality Date  . COLONOSCOPY  2015   [facility], advised to repeat q5 years; x 4 prior  . ESOPHAGOGASTRODUODENOSCOPY     x5 prior as of 2016  . INGUINAL HERNIA REPAIR     left  . NASAL SEPTUM SURGERY    . POLYPECTOMY     sigmoid colon  . Lauretta Chester, MD 03/06/19 [phone]

## 2019-02-27 NOTE — Discharge Instructions (Signed)
You have been seen at the [facility] today for shortness of breath. Your evaluation today was not suggestive of any emergent condition requiring medical intervention at this time. Your chest x-ray did not show any worrisome changes. However, some medical problems make take more time to appear. Therefore, it's very important that you pay attention to any new symptoms or worsening of your current condition.  Please proceed directly to the Emergency Department immediately should you feel worse in any way or have any of the following symptoms: increasing or different chest tightness, pain that spreads to your arm, neck, jaw, back or abdomen, worsening shortness of breath, or nausea and vomiting.

## 2019-02-27 NOTE — ED Triage Notes (Signed)
Patient sent from [facility] c/o persistent shortness of breath, worse with exertion and taking deep breaths, and low-grade fevers/chills since 02/04/19. She states she thinks she had covid that week - has been seen multiple times by PCP and at [facility] for same. Sent here for CT scan. Resp e/u, skin w/d.

## 2019-02-27 NOTE — ED Triage Notes (Signed)
Patient presents to Urgent Care with complaints of shortness of breath, intermittently since earlier this moth. Patient states she is more SOB with exertion, ambulating with pulse ox at home was 94% on room air. Pt is here per PCP instructions.

## 2019-02-27 NOTE — Discharge Instructions (Addendum)
As discussed, your evaluation today has been largely reassuring.  But, it is important that you monitor your condition carefully, and do not hesitate to return to the ED if you develop new, or concerning changes in your condition. ? ?Otherwise, please follow-up with your physician for appropriate ongoing care. ? ?

## 2019-02-27 NOTE — ED Notes (Signed)
Patient verbalizes understanding of discharge instructions. Opportunity for questioning and answers were provided. Patient discharged from [facility] by MD.

## 2019-02-27 NOTE — Progress Notes (Signed)
Documentation for Telephone encounter:  This telephone service is related to other E/M service within previous 7 days.  Patient consented to the consult.  This telephone consult involved patient and myself, Dorothea Ogle PA-C.  Subjective: Chief Complaint  Patient presents with  . SOB    recheck    Tele-visit today to follow-up from other recent visits and email messaging in the chart.  She continues to feel dyspnea on exertion, shortness of breath, fatigue, and some recent body aches and chills.  She has a few little small bumps on her upper thighs.  Friday was really dyspneic and exhausted.  Rested over the weekend.  And in the last 2 days felt a little better but then worse again today.  She continues to wonder if she has had the coronavirus although we have not had the ability to test her.  She is concerned that she is really not getting any better.  She is works from home, on the phone all day and cannot do her job right now due to the shortness of breath even trying to talk on the phone.  Denies calf pain, no calf swelling, no chest pain, no fever, no nausea vomiting, no bruising or bleeding, no recent travel or exposure COVID-19 that she knows of.  She is compliant with her regular maintenance medications for asthma and allergies.  She is on birth control.  She has a pulse oximeter and she reports going down to 94 to 95% moving around the house but at rest usually around 98%.  Pulse rate has ranged from 82 at rest to 105 walking around her condo.  Past Medical History:  Diagnosis Date  . Allergy   . Anemia    IV iron in past, prior to 2018  . Asthma   . Celiac disease    +prior biopsy  . GERD (gastroesophageal reflux disease)   . H/O bone density study 12/21/2017   normal  . Hip fracture ([place]) 2008   right, Dr. Oneida Alar  . History of stress test [address]  . Hypothyroidism   . Migraine headache without aura    gets bad nausea.  does well on Naratriptan and  Relpax.  Failed Imitrex, Maxalt  . Mood change    Wellbutrin, has used Zoloft prior  . Stress fracture    severeral prior  . Transfusion history    has had IV iron prior, Dr. Marin Olp prior management  . Wears contact lenses    Current Outpatient Medications on File Prior to Visit  Medication Sig Dispense Refill  . budesonide-formoterol (SYMBICORT) 160-4.5 MCG/ACT inhaler INHALE 2 PUFFS INTO THE LUNGS 2 (TWO) TIMES DAILY. 10.2 Inhaler 3  . buPROPion (WELLBUTRIN XL) 150 MG 24 hr tablet Take 1 tablet (150 mg total) by mouth daily. 90 tablet 0  . clarithromycin (BIAXIN) 500 MG tablet Take 1 tablet (500 mg total) by mouth 2 (two) times daily. 14 tablet 0  . [employer] D3 25 MCG (1000 UT) capsule TAKE 1 CAPSULE BY MOUTH EVERY DAY 90 capsule 0  . etonogestrel-ethinyl estradiol (NUVARING) 0.12-0.015 MG/24HR vaginal ring Place 1 each vaginally every 28 (twenty-eight) days. Insert vaginally and leave in place for 3 consecutive weeks, then remove for 1 week.    . FeAspGl-FeFum-B12-FA-C-Succ Ac (FERREX 28) MISC Take 1 tablet by mouth daily. 90 each 0  . naratriptan (AMERGE) 2.5 MG tablet TAKE 1 TABLET AS NEEDED FOR MIGRAINE, MAY REPEAT AFTER 4 HOURS (MAX 5MG IN 24HRS) 30 tablet 2  .  ondansetron (ZOFRAN) 4 MG tablet Take 1 tablet (4 mg total) by mouth every 8 (eight) hours as needed for nausea or vomiting. 20 tablet 0  . Peak Flow Meter DEVI 1 each by Does not apply route daily. 1 each 0  . PROAIR HFA 108 (90 Base) MCG/ACT inhaler Inhale 2 puffs into the lungs every 6 (six) hours as needed for wheezing or shortness of breath. 18 g 2  . RABEprazole (ACIPHEX) 20 MG tablet TAKE 1 TABLET BY MOUTH EVERY DAY 90 tablet 0  . Spacer/Aero-Holding Chambers (AEROCHAMBER MV) inhaler Use as instructed 1 each 0  . SYNTHROID 50 MCG tablet TAKE 1 TABLET (50 MCG TOTAL) BY MOUTH ONCE DAILY 90 tablet 0   No current facility-administered medications on file prior to visit.    ROS as in subjective  Objective: Wt 155 lb (70.3  kg)   LMP 02/11/2019 (Exact Date)   BMI 24.28 kg/m   Gen: nad Not examined in the office due to coronavirus stay at home measures    Assessment: Encounter Diagnoses  Name Primary?  . Mild persistent asthma without complication Yes  . Hypothyroidism, unspecified type   . SOB (shortness of breath)   . Abnormal pulse oximetry   . Other fatigue   . DOE (dyspnea on exertion)       Plan: We discussed her symptoms and concerns.  We discussed her previous visits related to the visit today.  We discussed the fact that she has completed antibiotics for sinus infection, continue to use her albuterol 2-3 times daily, continues on Symbicort regularly but not much improved.  I advised that she very well could have had the coronavirus and it may take weeks to fully resolve the fatigue and shortness of breath.  Since she is feeling a little worse again she is planning to go to urgent care today to get a chest x-ray and labs as they will see her today.  We discussed her requesting an urgent care CBC, BMET, chest x-ray, and considering test to rule out PE  She also request the coronavirus IgM IgG testing which is still in limited availability in the community

## 2019-02-28 ENCOUNTER — Encounter: Payer: Self-pay | Admitting: Medical

## 2019-02-28 ENCOUNTER — Other Ambulatory Visit: Payer: Self-pay | Admitting: Medical

## 2019-02-28 MED ORDER — PREDNISONE 10 MG PO TABS: ORAL_TABLET | ORAL | 0 refills | Status: DC

## 2019-03-04 ENCOUNTER — Encounter: Payer: BLUE CROSS/BLUE SHIELD | Admitting: Medical

## 2019-03-07 ENCOUNTER — Other Ambulatory Visit: Payer: Self-pay | Admitting: Medical

## 2019-03-07 MED ORDER — FLUCONAZOLE 150 MG PO TABS: 150.0000 mg | ORAL_TABLET | Freq: Once | ORAL | 0 refills | Status: AC

## 2019-03-11 ENCOUNTER — Telehealth: Payer: Self-pay | Admitting: Medical

## 2019-03-11 ENCOUNTER — Ambulatory Visit (INDEPENDENT_AMBULATORY_CARE_PROVIDER_SITE_OTHER): Payer: BLUE CROSS/BLUE SHIELD | Admitting: Medical

## 2019-03-11 ENCOUNTER — Encounter: Payer: Self-pay | Admitting: Medical

## 2019-03-11 ENCOUNTER — Other Ambulatory Visit: Payer: Self-pay

## 2019-03-11 VITALS — BP 120/76 | HR 70 | Temp 97.8°F | Resp 16 | Ht 67.0 in | Wt 160.0 lb

## 2019-03-11 DIAGNOSIS — J301 Allergic rhinitis due to pollen: Secondary | ICD-10-CM | POA: Diagnosis not present

## 2019-03-11 DIAGNOSIS — G43709 Chronic migraine without aura, not intractable, without status migrainosus: Secondary | ICD-10-CM

## 2019-03-11 DIAGNOSIS — Z8719 Personal history of other diseases of the digestive system: Secondary | ICD-10-CM | POA: Insufficient documentation

## 2019-03-11 DIAGNOSIS — K635 Polyp of colon: Secondary | ICD-10-CM | POA: Insufficient documentation

## 2019-03-11 DIAGNOSIS — Z0000 Encounter for general adult medical examination without abnormal findings: Secondary | ICD-10-CM

## 2019-03-11 DIAGNOSIS — Z8262 Family history of osteoporosis: Secondary | ICD-10-CM

## 2019-03-11 DIAGNOSIS — Z7189 Other specified counseling: Secondary | ICD-10-CM

## 2019-03-11 DIAGNOSIS — Z87312 Personal history of (healed) stress fracture: Secondary | ICD-10-CM

## 2019-03-11 DIAGNOSIS — K219 Gastro-esophageal reflux disease without esophagitis: Secondary | ICD-10-CM | POA: Diagnosis not present

## 2019-03-11 DIAGNOSIS — D509 Iron deficiency anemia, unspecified: Secondary | ICD-10-CM

## 2019-03-11 DIAGNOSIS — Z7185 Encounter for immunization safety counseling: Secondary | ICD-10-CM | POA: Insufficient documentation

## 2019-03-11 DIAGNOSIS — E559 Vitamin D deficiency, unspecified: Secondary | ICD-10-CM | POA: Diagnosis not present

## 2019-03-11 DIAGNOSIS — J988 Other specified respiratory disorders: Secondary | ICD-10-CM | POA: Insufficient documentation

## 2019-03-11 DIAGNOSIS — E039 Hypothyroidism, unspecified: Secondary | ICD-10-CM

## 2019-03-11 DIAGNOSIS — K900 Celiac disease: Secondary | ICD-10-CM

## 2019-03-11 DIAGNOSIS — Z8781 Personal history of (healed) traumatic fracture: Secondary | ICD-10-CM

## 2019-03-11 LAB — POCT URINALYSIS DIP (PROADVANTAGE DEVICE)
Bilirubin, UA: NEGATIVE
Blood, UA: NEGATIVE
Glucose, UA: NEGATIVE mg/dL
Ketones, POC UA: NEGATIVE mg/dL
Leukocytes, UA: NEGATIVE
Nitrite, UA: NEGATIVE
Protein Ur, POC: NEGATIVE mg/dL
Specific Gravity, Urine: 1.01
Urobilinogen, Ur: NEGATIVE
pH, UA: 6 (ref 5.0–8.0)

## 2019-03-11 MED ORDER — ERENUMAB-AOOE 70 MG/ML ~~LOC~~ SOAJ: 1.0000 "application " | SUBCUTANEOUS | 2 refills | Status: DC

## 2019-03-11 NOTE — Progress Notes (Signed)
Subjective:   HPI  Barbara Odonnell is a 43 y.o. female who presents for Chief Complaint  Patient presents with  . CPE    non fasting CPE  gyn-[facility],  allergiest Dr Carmelina Peal, Ortho Dr Oneida Alar, Dentiest Dr Sherren Mocha    Patient Care Team: Glade Lloyd, Camelia Eng, PA-C as PCP - General (Family Medicine)  Dr. Kendall Flack, gyn, [place] dentist Sees eye doctor  Concerns: She has several concerns.  Asthma- over the last several weeks we have done several virtual visit due to respiratory tract infection, possible COVID-19, asthma flare.  After weeks of doing with this she finally seems to be getting back to normal after doing a round of prednisone which she was reluctant to do at first.  She has seen improvement with Symbicort as well.  She continues on her regular asthma medications.  She would like to have COVID-19 antibody testing today  She has a history of colitis years ago, was thought to be ulcerative colitis, and she said he was diagnosed with celiac disease on endoscopy.  She has had several endoscopies in the past.  She has had a colonoscopy twice before which revealed a polyp.  She thinks she is due at this time for repeat colonoscopy.  Her last colonoscopy and EGD was with Dr.Zolton Devenji in [place], [place] several years ago.  She and her brother and mother have celiac disease.  Takes Acipehx daily.  Also uses pepcid at night seems helpful.   She has hx/o several stress fractures, last fracture several years ago including tibial fracture and right femur neck fracture, all due to running and over training.    She had bone density test last year that was normal.   Her mother has hx/o osteoporosis.   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     She exercises on average 4 days per week for an hour each time.  Teaches spin and fitness classes.    Does running, aerobics, spin, TRX and free weights.   Gets some vit D and calcium although she didn't have specific amount  she takes.     Migraines - would like to start a preventive.  Has been interested in Liborio Negron Torres or similar.   declined Topamax prior due to side effect concerns, and given palpations has avoided TCA such as nortriptyline.   Getting on average 2 migraines per week.    Migraines triggers after high intensity exercise or long duration exercise, or triggered by alcohol.   Triptans certainly help the acute headaches.   Has tried several Triptans prior.  No hx/o botox.  Last neurology consult years ago.  Last brain imaging 20 years ago.   No family hx/o brain tumor but cousin died of aneurysm.   Last neurology, [facility].   She wonders about having ongoing chronic issues with mono given fatigue.  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  Works for [employer].   She is concerned about slightly elevated BPs. Home BPs included 124/91, 112/87, 121/96, 120/88,  Pulse 70, 83, 82, 75  Given recent antibiotics for sinus issues, worried about yeast infection.  having ongoing redness, inflammation and discharge.  Sees gynecology tomorrow.   Past Medical History:  Diagnosis Date  . Allergy   . Anemia    IV iron in past, prior to 2018  . Asthma   . Celiac disease    +prior biopsy  . GERD (gastroesophageal reflux disease)   . H/O  bone density study 12/21/2017   normal  . Hip fracture ([place]) 2008   right, Dr. Oneida Alar  . History of stress test [address]  . Hypothyroidism   . Migraine headache without aura    gets bad nausea.  does well on Naratriptan and Relpax.  Failed Imitrex, Maxalt  . Mood change    Wellbutrin, has used Zoloft prior  . Stress fracture    severeral prior  . Transfusion history    has had IV iron prior, Dr. Marin Olp prior management  . Wears contact lenses     Past Surgical History:  Procedure Laterality Date  . COLONOSCOPY  2015   [facility], advised to repeat q5 years; x 4 prior  . ESOPHAGOGASTRODUODENOSCOPY     x5 prior as of 2016  . INGUINAL  HERNIA REPAIR     left  . NASAL SEPTUM SURGERY    . POLYPECTOMY     sigmoid colon  . SINOSCOPY      Social History   Socioeconomic History  . Marital status: Single    Spouse name: Not on file  . Number of children: Not on file  . Years of education: Not on file  . Highest education level: Not on file  Occupational History  . Not on file  Social Needs  . Financial resource strain: Not on file  . Food insecurity:    Worry: Not on file    Inability: Not on file  . Transportation needs:    Medical: Not on file    Non-medical: Not on file  Tobacco Use  . Smoking status: Never Smoker  . Smokeless tobacco: Never Used  Substance and Sexual Activity  . Alcohol use: No    Alcohol/week: 1.0 standard drinks    Types: 1 Glasses of wine per week    Comment: rare  . Drug use: No  . Sexual activity: Not on file  Lifestyle  . Physical activity:    Days per week: Not on file    Minutes per session: Not on file  . Stress: Not on file  Relationships  . Social connections:    Talks on phone: Not on file    Gets together: Not on file    Attends religious service: Not on file    Active member of club or organization: Not on file    Attends meetings of clubs or organizations: Not on file    Relationship status: Not on file  . Intimate partner violence:    Fear of current or ex partner: Not on file    Emotionally abused: Not on file    Physically abused: Not on file    Forced sexual activity: Not on file  Other Topics Concern  . Not on file  Social History Narrative   Lives alone, works professor at [employer], now counseling at [employer],  Exercise - 6-7 days per week, teaches spin at [employer], swam competitively in college, marathon runner.      [facility]:   Originally from [place]. Has also lived in [place], [place], & [place]. Currently working in counseling. No pets currently. No bird exposure. No mold or hot tub exposure. Currently has carpet in the bedroom. No  draperies. No exposure to asbestos.     Family History  Problem Relation Age of Onset  . Hypertension Mother   . Osteopenia Mother   . Cancer Father        asbestos related lung cancer  . Heart  disease Father 59  . Pulmonary embolism Father   . Celiac disease Brother   . Celiac disease Brother   . Stroke Maternal Grandmother   . COPD Maternal Grandmother   . Osteopenia Maternal Grandmother   . Heart disease Maternal Grandfather   . Stroke Maternal Grandfather   . Heart disease Paternal Grandfather        died 21yo  . Diabetes Neg Hx      Current Outpatient Medications:  .  budesonide-formoterol (SYMBICORT) 160-4.5 MCG/ACT inhaler, INHALE 2 PUFFS INTO THE LUNGS 2 (TWO) TIMES DAILY. (Patient taking differently: Inhale 2 puffs into the lungs 2 (two) times a day. ), Disp: 10.2 Inhaler, Rfl: 3 .  buPROPion (WELLBUTRIN XL) 150 MG 24 hr tablet, Take 1 tablet (150 mg total) by mouth daily., Disp: 90 tablet, Rfl: 0 .  [employer] D3 25 MCG (1000 UT) capsule, TAKE 1 CAPSULE BY MOUTH EVERY DAY (Patient taking differently: Take 1,000 Units by mouth daily. ), Disp: 90 capsule, Rfl: 0 .  etonogestrel-ethinyl estradiol (NUVARING) 0.12-0.015 MG/24HR vaginal ring, Place 1 each vaginally every 28 (twenty-eight) days. Insert vaginally and leave in place for 3 consecutive weeks, then remove for 1 week., Disp: , Rfl:  .  FeAspGl-FeFum-B12-FA-C-Succ Ac (FERREX 28) MISC, Take 1 tablet by mouth daily., Disp: 90 each, Rfl: 0 .  naratriptan (AMERGE) 2.5 MG tablet, TAKE 1 TABLET AS NEEDED FOR MIGRAINE, MAY REPEAT AFTER 4 HOURS (MAX 5MG IN 24HRS) (Patient taking differently: Take 2.5 mg by mouth as needed for migraine. MAY REPEAT AFTER 4 HOURS (MAX 5MG IN 24HRS)), Disp: 30 tablet, Rfl: 2 .  Peak Flow Meter DEVI, 1 each by Does not apply route daily., Disp: 1 each, Rfl: 0 .  PROAIR HFA 108 (90 Base) MCG/ACT inhaler, Inhale 2 puffs into the lungs every 6 (six) hours as needed for wheezing or shortness of breath., Disp: 18  g, Rfl: 2 .  RABEprazole (ACIPHEX) 20 MG tablet, TAKE 1 TABLET BY MOUTH EVERY DAY (Patient taking differently: Take 20 mg by mouth daily. ), Disp: 90 tablet, Rfl: 0 .  Spacer/Aero-Holding Chambers (AEROCHAMBER MV) inhaler, Use as instructed, Disp: 1 each, Rfl: 0 .  SYNTHROID 50 MCG tablet, TAKE 1 TABLET (50 MCG TOTAL) BY MOUTH ONCE DAILY (Patient taking differently: Take 50 mcg by mouth daily. ), Disp: 90 tablet, Rfl: 0 .  Erenumab-aooe (AIMOVIG) 70 MG/ML SOAJ, Inject 1 application into the skin every 30 (thirty) days., Disp: 1 mL, Rfl: 2 .  ondansetron (ZOFRAN) 4 MG tablet, Take 1 tablet (4 mg total) by mouth every 8 (eight) hours as needed for nausea or vomiting. (Patient not taking: Reported on 03/11/2019), Disp: 20 tablet, Rfl: 0  Allergies  Allergen Reactions  . Sulfa Antibiotics Anaphylaxis  . Singulair [Montelukast Sodium]     Angry, irritable, out of skin feeling    Reviewed their medical, surgical, family, social, medication, and allergy history and updated chart as appropriate.   Review of Systems Constitutional: -fever, -chills, -sweats, -unexpected weight change, -decreased appetite, -fatigue Allergy: -sneezing, -itching, -congestion Dermatology: -changing moles, --rash, -lumps ENT: -runny nose, -ear pain, -sore throat, -hoarseness, -sinus pain, -teeth pain, - ringing in ears, -hearing loss, -nosebleeds Cardiology: -chest pain, -palpitations, -swelling, -difficulty breathing when lying flat, -waking up short of breath Respiratory: -cough, -shortness of breath, -difficulty breathing with exercise or exertion, -wheezing, -coughing up blood Gastroenterology: -abdominal pain, -nausea, -vomiting, -diarrhea, -constipation, -blood in stool, -changes in bowel movement, -difficulty swallowing or eating Hematology: -bleeding, -bruising  Musculoskeletal: -  joint aches, -muscle aches, -joint swelling, -back pain, -neck pain, -cramping, -changes in gait Ophthalmology: denies vision changes,  eye redness, itching, discharge Urology: -burning with urination, -difficulty urinating, -blood in urine, -urinary frequency, -urgency, -incontinence Neurology: -headache, -weakness, -tingling, -numbness, -memory loss, -falls, -dizziness Psychology: -depressed mood, -agitation, -sleep problems Breast/gyn: -breast tenderness, -discharge, -lumps, +vaginal discharge,- irregular periods, -heavy periods     Objective:  BP 120/76   Pulse 70   Temp 97.8 F (36.6 C) (Temporal)   Resp 16   Ht 5' 7"  (1.702 m)   Wt 160 lb (72.6 kg)   LMP 02/10/2019 (Exact Date)   SpO2 98%   BMI 25.06 kg/m     Wt Readings from Last 3 Encounters:  03/11/19 160 lb (72.6 kg)  02/27/19 155 lb (70.3 kg)  02/11/19 155 lb (70.3 kg)   General appearance: alert, no distress, WD/WN, Caucasian female Skin: unremarkable HEENT: normocephalic, conjunctiva/corneas normal, sclerae anicteric, PERRLA, EOMi, nares patent, no discharge or erythema, pharynx normal Oral cavity: MMM, tongue normal, teeth in good repair Neck: supple, no lymphadenopathy, no thyromegaly, no masses, normal ROM, no bruits Chest: non tender, normal shape and expansion Heart: RRR, normal S1, S2, no murmurs Lungs: CTA bilaterally, no wheezes, rhonchi, or rales Abdomen: +bs, soft, non tender, non distended, no masses, no hepatomegaly, no splenomegaly, no bruits Back: non tender, normal ROM, no scoliosis Musculoskeletal: upper extremities non tender, no obvious deformity, normal ROM throughout, lower extremities non tender, no obvious deformity, normal ROM throughout Extremities: no edema, no cyanosis, no clubbing Pulses: 2+ symmetric, upper and lower extremities, normal cap refill Neurological: alert, oriented x 3, CN2-12 intact, strength normal upper extremities and lower extremities, sensation normal throughout, DTRs 2+ throughout, no cerebellar signs, gait normal Psychiatric: normal affect, behavior normal, pleasant  Breast/gyn/rectal - deferred to  gynecology  Assessment and Plan :   Encounter Diagnoses  Name Primary?  . Routine general medical examination at a health care facility Yes  . Chronic migraine without aura without status migrainosus, not intractable   . Allergic rhinitis due to pollen, unspecified seasonality   . Gastroesophageal reflux disease, esophagitis presence not specified   . Celiac disease   . Hypothyroidism, unspecified type   . Family history of osteoporosis   . History of fracture   . Iron deficiency anemia, unspecified iron deficiency anemia type   . Vitamin D deficiency   . Vaccine counseling   . Respiratory tract infection   . History of stress fracture   . History of colitis   . Polyp of colon, unspecified part of colon, unspecified type     Physical exam - discussed and counseled on healthy lifestyle, diet, exercise, preventative care, vaccinations, sick and well care, proper use of emergency dept and after hours care, and addressed their concerns.    Health screening: Advised they see their eye doctor yearly for routine vision care. Advised they see their dentist yearly for routine dental care including hygiene visits twice yearly. See your gynecologist yearly for routine gynecological care.  I reviewed 12/2017 normal bone density study   Cancer screening Counseled on self breast exams, mammograms, cervical cancer screening  She is due for fu with  GI including colonoscopy for prior colitis and polyp.   She wants to be referred maybe in a month go GI here locally.  [place] EGD and colon[place] in [place] several years ago.  F/u with gynecology as planned tomorrow for issues and updated pap   Vaccinations: Advised yearly influenza  vaccine Counseled on pneumococcal vaccine, Td vaccine.  She will consider.    Separate significant chronic issues discussed: Asthma - plan updated full PFTs  Covid 19 antibiotic testing today given recent illness and several weeks of dyspnea in last few  weeks  Migraines - begin trial of Amiovig daily, c/t triptan for abortive therapy as needed.   Counseled on risks/benefits of medication.    Barbara Odonnell was seen today for cpe.  Diagnoses and all orders for this visit:  Routine general medical examination at a health care facility -     POCT Urinalysis DIP (Proadvantage Device) -     VITAMIN D 25 Hydroxy (Vit-D Deficiency, Fractures) -     Ferritin -     Iron  Chronic migraine without aura without status migrainosus, not intractable  Allergic rhinitis due to pollen, unspecified seasonality  Gastroesophageal reflux disease, esophagitis presence not specified  Celiac disease  Hypothyroidism, unspecified type  Family history of osteoporosis  History of fracture  Iron deficiency anemia, unspecified iron deficiency anemia type -     Ferritin -     Iron  Vitamin D deficiency -     VITAMIN D 25 Hydroxy (Vit-D Deficiency, Fractures)  Vaccine counseling  Respiratory tract infection -     SARS-CoV-2 Antibody, IgM  History of stress fracture  History of colitis  Polyp of colon, unspecified part of colon, unspecified type  Other orders -     Erenumab-aooe (AIMOVIG) 70 MG/ML SOAJ; Inject 1 application into the skin every 30 (thirty) days.   Follow-up pending labs, yearly for physical

## 2019-03-11 NOTE — Telephone Encounter (Signed)
Please set up full PFTs at [facility], pre and post bronchodilator   She will want to see GI soon for consult for history of polyp, hx/o colitis, hx/o celiac disease.  When you speak to her, if ready to make that appt, that is fine, but refer to Dr. Carlean Purl if possible.

## 2019-03-12 ENCOUNTER — Other Ambulatory Visit: Payer: Self-pay

## 2019-03-12 DIAGNOSIS — K635 Polyp of colon: Secondary | ICD-10-CM

## 2019-03-12 DIAGNOSIS — K900 Celiac disease: Secondary | ICD-10-CM

## 2019-03-12 DIAGNOSIS — N898 Other specified noninflammatory disorders of vagina: Secondary | ICD-10-CM | POA: Diagnosis not present

## 2019-03-12 DIAGNOSIS — K219 Gastro-esophageal reflux disease without esophagitis: Secondary | ICD-10-CM

## 2019-03-12 DIAGNOSIS — J4530 Mild persistent asthma, uncomplicated: Secondary | ICD-10-CM

## 2019-03-12 DIAGNOSIS — R7981 Abnormal blood-gas level: Secondary | ICD-10-CM

## 2019-03-12 DIAGNOSIS — R399 Unspecified symptoms and signs involving the genitourinary system: Secondary | ICD-10-CM | POA: Diagnosis not present

## 2019-03-12 DIAGNOSIS — J301 Allergic rhinitis due to pollen: Secondary | ICD-10-CM

## 2019-03-12 DIAGNOSIS — R0602 Shortness of breath: Secondary | ICD-10-CM

## 2019-03-12 DIAGNOSIS — R0609 Other forms of dyspnea: Secondary | ICD-10-CM

## 2019-03-12 DIAGNOSIS — R0600 Dyspnea, unspecified: Secondary | ICD-10-CM

## 2019-03-12 DIAGNOSIS — Z8719 Personal history of other diseases of the digestive system: Secondary | ICD-10-CM

## 2019-03-12 LAB — IRON: Iron: 103 ug/dL (ref 27–159)

## 2019-03-12 LAB — VITAMIN D 25 HYDROXY (VIT D DEFICIENCY, FRACTURES): Vit D, 25-Hydroxy: 44.5 ng/mL (ref 30.0–100.0)

## 2019-03-12 LAB — SAR COV2 SEROLOGY (COVID19)AB(IGG),IA: SARS-CoV-2 Ab, IgG: NEGATIVE

## 2019-03-12 LAB — SARS-COV-2 ANTIBODY, IGM: SARS-CoV-2 Antibody, IgM: NEGATIVE

## 2019-03-12 LAB — FERRITIN: Ferritin: 26 ng/mL (ref 15–150)

## 2019-03-12 NOTE — Telephone Encounter (Signed)
Referral have been put into epic and proficient.

## 2019-03-16 ENCOUNTER — Telehealth: Payer: Self-pay | Admitting: Medical

## 2019-03-16 NOTE — Telephone Encounter (Signed)
P.A. AIMOVIG

## 2019-03-16 NOTE — Telephone Encounter (Signed)
P.A. approved, left message for pt

## 2019-03-17 ENCOUNTER — Other Ambulatory Visit: Payer: Self-pay | Admitting: Medical

## 2019-03-18 ENCOUNTER — Other Ambulatory Visit: Payer: Self-pay | Admitting: Medical

## 2019-03-18 MED ORDER — CHOLECALCIFEROL 25 MCG (1000 UT) PO CAPS: 1000.0000 [IU] | ORAL_CAPSULE | Freq: Every day | ORAL | 3 refills | Status: DC

## 2019-03-18 NOTE — Telephone Encounter (Signed)
Is this okay to refill otc vitamin D

## 2019-03-26 ENCOUNTER — Other Ambulatory Visit: Payer: Self-pay | Admitting: Medical

## 2019-03-26 ENCOUNTER — Other Ambulatory Visit: Payer: Self-pay

## 2019-03-26 ENCOUNTER — Telehealth: Payer: Self-pay | Admitting: Medical

## 2019-03-26 NOTE — Telephone Encounter (Signed)
Fax refill request for  Ranitidine 150 mg #90  Last filled 12/14/18

## 2019-03-26 NOTE — Telephone Encounter (Signed)
This medication has been discontinued 

## 2019-03-26 NOTE — Telephone Encounter (Signed)
Is this ok to refill?  

## 2019-03-27 ENCOUNTER — Other Ambulatory Visit: Payer: Self-pay | Admitting: Medical

## 2019-03-27 MED ORDER — FAMOTIDINE 40 MG PO TABS: 40.0000 mg | ORAL_TABLET | Freq: Every evening | ORAL | 0 refills | Status: DC

## 2019-03-27 MED ORDER — RABEPRAZOLE SODIUM 20 MG PO TBEC: 20.0000 mg | DELAYED_RELEASE_TABLET | Freq: Every day | ORAL | 3 refills | Status: DC

## 2019-03-27 NOTE — Telephone Encounter (Signed)
Left message on voicemail for patient to call back. 

## 2019-03-27 NOTE — Telephone Encounter (Signed)
Patient states that she is taking pepcid 1/2 of the week from aciphex and it is doing good and she wanted to see if you could send in pepcid to the pharmacy to see if it cheaper than OTC.

## 2019-04-19 ENCOUNTER — Other Ambulatory Visit: Payer: Self-pay | Admitting: Medical

## 2019-04-29 ENCOUNTER — Other Ambulatory Visit: Payer: Self-pay | Admitting: Medical

## 2019-04-29 NOTE — Telephone Encounter (Signed)
Is this ok to refill?  

## 2019-05-20 ENCOUNTER — Other Ambulatory Visit: Payer: Self-pay | Admitting: Medical

## 2019-05-20 ENCOUNTER — Ambulatory Visit: Payer: BLUE CROSS/BLUE SHIELD | Admitting: Internal Medicine

## 2019-05-20 MED ORDER — AIMOVIG 70 MG/ML ~~LOC~~ SOAJ: 1.0000 "application " | SUBCUTANEOUS | 2 refills | Status: DC

## 2019-05-30 ENCOUNTER — Other Ambulatory Visit: Payer: Self-pay | Admitting: Medical

## 2019-05-30 NOTE — Telephone Encounter (Signed)
Is this ok to refill?  

## 2019-06-04 DIAGNOSIS — D649 Anemia, unspecified: Secondary | ICD-10-CM | POA: Diagnosis not present

## 2019-06-04 DIAGNOSIS — Z124 Encounter for screening for malignant neoplasm of cervix: Secondary | ICD-10-CM | POA: Diagnosis not present

## 2019-06-04 DIAGNOSIS — R32 Unspecified urinary incontinence: Secondary | ICD-10-CM | POA: Diagnosis not present

## 2019-06-04 DIAGNOSIS — Z1231 Encounter for screening mammogram for malignant neoplasm of breast: Secondary | ICD-10-CM | POA: Diagnosis not present

## 2019-06-04 DIAGNOSIS — N841 Polyp of cervix uteri: Secondary | ICD-10-CM | POA: Diagnosis not present

## 2019-06-04 DIAGNOSIS — Z113 Encounter for screening for infections with a predominantly sexual mode of transmission: Secondary | ICD-10-CM | POA: Diagnosis not present

## 2019-06-04 LAB — HM PAP SMEAR: HM Pap smear: NORMAL

## 2019-06-06 ENCOUNTER — Ambulatory Visit: Payer: BLUE CROSS/BLUE SHIELD | Admitting: Sports Medicine

## 2019-06-17 ENCOUNTER — Encounter: Payer: Self-pay | Admitting: Internal Medicine

## 2019-06-17 ENCOUNTER — Ambulatory Visit (INDEPENDENT_AMBULATORY_CARE_PROVIDER_SITE_OTHER): Payer: BC Managed Care – PPO | Admitting: Internal Medicine

## 2019-06-17 ENCOUNTER — Other Ambulatory Visit: Payer: Self-pay

## 2019-06-17 VITALS — BP 114/70 | HR 67 | Temp 97.9°F | Ht 67.0 in | Wt 158.0 lb

## 2019-06-17 DIAGNOSIS — R0609 Other forms of dyspnea: Secondary | ICD-10-CM | POA: Diagnosis not present

## 2019-06-17 DIAGNOSIS — R0600 Dyspnea, unspecified: Secondary | ICD-10-CM

## 2019-06-17 DIAGNOSIS — J4530 Mild persistent asthma, uncomplicated: Secondary | ICD-10-CM | POA: Diagnosis not present

## 2019-06-17 NOTE — Patient Instructions (Addendum)
GERD (REFLUX)  is an extremely common cause of respiratory symptoms just like yours , many times with no obvious heartburn at all.    It can be treated with medication, but also with lifestyle changes including elevation of the head of your bed (ideally with 6 -8inch blocks under the headboard of your bed),  Smoking cessation, avoidance of late meals, excessive alcohol, and avoid fatty foods, chocolate, peppermint, colas, red wine, and acidic juices such as orange juice.  NO MINT OR MENTHOL PRODUCTS SO NO COUGH DROPS  USE SUGARLESS CANDY INSTEAD (Jolley ranchers or Stover's or Life Savers) or even ice chips will also do - the key is to swallow to prevent all throat clearing. NO OIL BASED VITAMINS - use powdered substitutes.  Avoid fish oil when coughing.   Symbicort 160 Take 2 puffs first thing in am and then another 2 puffs about 12 hours later.   Work on inhaler technique:  relax and gently blow all the way out then take a nice smooth deep breath back in, triggering the inhaler at same time you start breathing in.  Hold for up to 5 seconds if you can. Blow out thru nose. Rinse and gargle with water when done.   Please schedule a follow up office visit in 6 weeks, call sooner if needed for a full pfts

## 2019-06-17 NOTE — Progress Notes (Addendum)
Barbara Odonnell, female    DOB: 04-May-1976,    MRN: [id number]   Brief patient profile:  35 yowf never smoker grew up in [place] with lots of sinus infections starting in HS able to swim competitively then moved to [place] 2004 with onset of wheezing and working with Orvil Feil on allergy shots and prn inhalers  seemed to help for a few years and eval in [place] by allergist who also rec shotss  seemed to help but still needed rx in [place] with  maint inhaler Breo then symbicort by Dr Ashok Cordia who last saw self referred back to pulmonary clinic  concerned that she has small airways dz and may be developing copd so requesting pfts.   [facility]ozlow eval in meantime 10/16/18: mild persistent asthma ok to use breo or symbicort whichever insurance covers.    History of Present Illness  06/17/2019  Pulmonary/ 1st office eval/Barbara Odonnell  On symbicort 160 x 2 pffs timed an hour or so pre ex almost never uses 2bid rx  Chief Complaint  Patient presents with  . Pulmonary Consult    Self referral for Asthma. She reports that she would like a PFT. She reports that her asthma is controlled currently.  Dyspnea:  New symptoms at  peak exercise despite rescue rx and pre treatment with symbicort 160 x 2 x one hour pre ex Cough: not now Sleep: not now with rx for reflux  SABA use: as above, mostly just  with ex, more with hot weather ex  No obvious day to day or daytime variability or assoc excess/ purulent sputum or mucus plugs or hemoptysis or cp or chest tightness, subjective wheeze or overt sinus or hb symptoms.   Sleeping  without nocturnal  or early am exacerbation  of respiratory  c/o's or need for noct saba. Also denies any obvious fluctuation of symptoms with weather or environmental changes or other aggravating or alleviating factors except as outlined above   No unusual exposure hx or h/o childhood pna/ asthma or knowledge of premature birth.  Current Allergies, Complete Past Medical History, Past Surgical History,  Family History, and Social History were reviewed in [facility] record.  ROS  The following are not active complaints unless bolded Hoarseness, sore throat, dysphagia, dental problems, itching, sneezing,  nasal congestion or discharge of excess mucus or purulent secretions, ear ache,   fever, chills, sweats, unintended wt loss or wt gain, classically pleuritic or exertional cp,  orthopnea pnd or arm/hand swelling  or leg swelling, presyncope, palpitations, abdominal pain, anorexia, nausea, vomiting, diarrhea  or change in bowel habits or change in bladder habits, change in stools or change in urine, dysuria, hematuria,  rash, arthralgias, visual complaints, headache, numbness, weakness or ataxia or problems with walking or coordination,  change in mood or  memory.              Past Medical History:  Diagnosis Date  . Allergy   . Anemia    IV iron in past, prior to 2018  . Asthma   . Celiac disease    +prior biopsy  . GERD (gastroesophageal reflux disease)   . H/O bone density study 12/21/2017   normal  . Hip fracture ([place]) 2008   right, Dr. Oneida Alar  . History of stress test [address]  . Hypothyroidism   . Migraine headache without aura    gets bad nausea.  does well on Naratriptan and Relpax.  Failed Imitrex, Maxalt  .  Mood change    Wellbutrin, has used Zoloft prior  . Stress fracture    severeral prior  . Transfusion history    has had IV iron prior, Dr. Marin Olp prior management  . Wears contact lenses     Outpatient Medications Prior to Visit  Medication Sig Dispense Refill  . budesonide-formoterol (SYMBICORT) 160-4.5 MCG/ACT inhaler TAKE 2 PUFFS BY MOUTH TWICE A DAY 30.6 Inhaler 1  . buPROPion (WELLBUTRIN XL) 150 MG 24 hr tablet Take 1 tablet (150 mg total) by mouth daily. 90 tablet 0  . Cholecalciferol ([employer] D3) 25 MCG (1000 UT) capsule Take 1 capsule (1,000 Units total) by mouth daily. 90 capsule 3  . Erenumab-aooe (AIMOVIG) 70 MG/ML SOAJ  Inject 1 application into the skin every 30 (thirty) days. 1 mL 2  . etonogestrel-ethinyl estradiol (NUVARING) 0.12-0.015 MG/24HR vaginal ring Place 1 each vaginally every 28 (twenty-eight) days. Insert vaginally and leave in place for 3 consecutive weeks, then remove for 1 week.    . famotidine (PEPCID) 40 MG tablet Take 1 tablet (40 mg total) by mouth every evening. 90 tablet 0  . FeAspGl-FeFum-B12-FA-C-Succ Ac (FERREX 28) MISC TAKE 1 TABLET BY MOUTH EVERY DAY 90 each 0  . naratriptan (AMERGE) 2.5 MG tablet TAKE 1 TABLET AS NEEDED FOR MIGRAINE, MAY REPEAT AFTER 4 HOURS (MAX 5MG IN 24HRS) (Patient taking differently: Take 2.5 mg by mouth as needed for migraine. MAY REPEAT AFTER 4 HOURS (MAX 5MG IN 24HRS)) 30 tablet 2  . ondansetron (ZOFRAN) 4 MG tablet Take 1 tablet (4 mg total) by mouth every 8 (eight) hours as needed for nausea or vomiting. 20 tablet 0  . Peak Flow Meter DEVI 1 each by Does not apply route daily. 1 each 0  . PROAIR HFA 108 (90 Base) MCG/ACT inhaler INHALE 2 INHALATIONS EVERY 6 HOURS AS NEEDED FOR WHEEZING/SHORTNESS OF BREATH 18 g 1  . RABEprazole (ACIPHEX) 20 MG tablet Take 1 tablet (20 mg total) by mouth daily. 90 tablet 3  . Spacer/Aero-Holding Chambers (AEROCHAMBER MV) inhaler Use as instructed 1 each 0  . SYNTHROID 50 MCG tablet TAKE 1 TABLET BY MOUTH EVERY DAY 90 tablet 0      Objective:     BP 114/70 (BP Location: Left Arm, Cuff Size: Normal)   Pulse 67   Temp 97.9 F (36.6 C) (Oral)   Ht 5' 7"  (1.702 m)   Wt 158 lb (71.7 kg)   SpO2 99%   BMI 24.75 kg/m   SpO2: 99 %  RA  Intense wf who seemed uncomfortable with questions attempting to clear up the nature and timing of her resp symptoms and required additional explanation as to why this was so important before ordering tests that require pre-cert/ covid screening.   HEENT: nl dentition, turbinates bilaterally, and oropharynx. Nl external ear canals without cough reflex   NECK :  without JVD/Nodes/TM/ nl  carotid upstrokes bilaterally   LUNGS: no acc muscle use,  Nl contour chest which is clear to A and P bilaterally without cough on insp or exp maneuvers   CV:  RRR  no s3 or murmur or increase in P2, and no edema   ABD:  soft and nontender with nl inspiratory excursion in the supine position. No bruits or organomegaly appreciated, bowel sounds nl  MS:  Nl gait/ ext warm without deformities, calf tenderness, cyanosis or clubbing No obvious joint restrictions   SKIN: warm and dry without lesions    NEURO:  alert, approp, nl  sensorium with  no motor or cerebellar deficits apparent.       Assessment   DOE (dyspnea on exertion) Noted x 2018 only at very highest levels of ex    Symptoms are   disproportionate to objective findings and not clear to what extent this is actually a pulmonary  problem but pt does appear to have difficult to sort out respiratory symptoms of unknown origin for which  DDX  = almost all start with A and  include Adherence, Ace Inhibitors, Acid Reflux, Active Sinus Disease, Alpha 1 Antitripsin deficiency, Anxiety masquerading as Airways dz,  ABPA,  Allergy(esp in young), Aspiration (esp in elderly), Adverse effects of meds,  Active smoking or Vaping, A bunch of PE's/clot burden (a few small clots can't cause this syndrome unless there is already severe underlying pulm or vascular dz with poor reserve),  Anemia or thyroid disorder, plus two Bs  = Bronchiectasis and Beta blocker use..and one C= CHF    Adherence is always the initial "prime suspect" and is a multilayered concern that requires a "trust but verify" approach in every patient - starting with knowing how to use medications, especially inhalers, correctly, keeping up with refills and understanding the fundamental difference between maintenance and prns vs those medications only taken for a very short course and then stopped and not refilled.  - see hfa teaching  - needs to understand synergy in rx with  symbicort only comes with using 2 q 12h and only after  Minimum of a week consitent rx    ? Acid (or non-acid) GERD > always difficult to exclude as up to 75% of pts in some series report no assoc GI/ Heartburn symptoms> rec continue max (24h)  acid suppression and diet restrictions/ reviewed     ? Allergy / asthma > intol of singulair, not sick enough for biologics and not using symb 160 corrrectly anyway so should try that first   ? Anxiety > usually at the bottom of this list of usual suspects but should be included her    based on H and P and note already on psychotropics and may interfere with adherence and also interpretation of response or lack thereof to symptom management which can be quite subjective.   ? Active sinus Dx >  Ongoing issue detected when reported that she couldn't breath the symbicort out thru her nose as rec         Asthma, chronic, mild persistent, uncomplicated Onset ? In her 74s - 06/17/2019  After extensive coaching inhaler device,  effectiveness =    75% (poor trigger/ breath s spacer which she doesn't carry with her   Although it's reasonable to use symb "prn" Based on two studies from NEJM  378; 20 p 1865 (2018) and 380 : p2020-30 (2019) in pts with mild asthma it is reasonable to use low dose symbicort eg 80 2bid "prn" flare in this setting but I emphasized this was only shown with symbicort 80 (not 160)and takes advantage of the rapid onset of action but is not the same as "rescue therapy" but can be stopped once the acute symptoms have resolved and the need for rescue has been minimized  - neither of these goals have been reached in the last 2 years so rec  >>>symb 160 2bid either use spacer consistently with both maint and prn or learn simultaneous trigger and breathe  >>> return for full pfts at her request though I strongly feel CPST will be needed while  on symb and max gerd rx to sort out her symptoms at peak ex/ advised      Total time devoted to  counseling  > 50 % of initial 60 min office visit:  reviewed case with pt/I performed device teaching  using a teach back technique which also  extended face to face time for this visit (see above) / discussion of options/alternatives/ personally creating written customized instructions  in presence of pt  then going over those specific  Instructions directly with the pt including how to use all of the meds but in particular covering each new medication in detail and the difference between the maintenance= "automatic" meds and the prns using an action plan format for the latter (If this problem/symptom => do that organization reading Left to right).  Please see AVS from this visit for a full list of these instructions which I personally wrote for this pt and  are unique to this visit.     Christinia Gully, MD 06/17/2019

## 2019-06-17 NOTE — Assessment & Plan Note (Signed)
Noted x 2018 only at very highest levels of ex    Symptoms are   disproportionate to objective findings and not clear to what extent this is actually a pulmonary  problem but pt does appear to have difficult to sort out respiratory symptoms of unknown origin for which  DDX  = almost all start with A and  include Adherence, Ace Inhibitors, Acid Reflux, Active Sinus Disease, Alpha 1 Antitripsin deficiency, Anxiety masquerading as Airways dz,  ABPA,  Allergy(esp in young), Aspiration (esp in elderly), Adverse effects of meds,  Active smoking or Vaping, A bunch of PE's/clot burden (a few small clots can't cause this syndrome unless there is already severe underlying pulm or vascular dz with poor reserve),  Anemia or thyroid disorder, plus two Bs  = Bronchiectasis and Beta blocker use..and one C= CHF    Adherence is always the initial "prime suspect" and is a multilayered concern that requires a "trust but verify" approach in every patient - starting with knowing how to use medications, especially inhalers, correctly, keeping up with refills and understanding the fundamental difference between maintenance and prns vs those medications only taken for a very short course and then stopped and not refilled.  - see hfa teaching  - needs to understand synergy in rx with symbicort only comes with using 2 q 12h and only after  Minimum of a week consitent rx    ? Acid (or non-acid) GERD > always difficult to exclude as up to 75% of pts in some series report no assoc GI/ Heartburn symptoms> rec continue max (24h)  acid suppression and diet restrictions/ reviewed     ? Allergy / asthma > intol of singulair, not sick enough for biologics and not using symb 160 corrrectly anyway so should try that first   ? Anxiety > usually at the bottom of this list of usual suspects but should be included her    based on H and P and note already on psychotropics and may interfere with adherence and also interpretation of response or  lack thereof to symptom management which can be quite subjective.   ? Active sinus Dx >  Ongoing issue detected when reported that she couldn't breath the symbicort out thru her nose as rec

## 2019-06-17 NOTE — Assessment & Plan Note (Signed)
Onset ? In her 63s - 06/17/2019  After extensive coaching inhaler device,  effectiveness =    75% (poor trigger/ breath s spacer which she doesn't carry with her   Although it's reasonable to use symb "prn" Based on two studies from NEJM  378; 20 p 1865 (2018) and 380 : p2020-30 (2019) in pts with mild asthma it is reasonable to use low dose symbicort eg 80 2bid "prn" flare in this setting but I emphasized this was only shown with symbicort 80 (not 160)and takes advantage of the rapid onset of action but is not the same as "rescue therapy" but can be stopped once the acute symptoms have resolved and the need for rescue has been minimized  - neither of these goals have been reached in the last 2 years so rec  >>>symb 160 2bid either use spacer consistently with both maint and prn or learn simultaneous trigger and breathe  >>> return for full pfts at her request though I strongly feel CPST will be needed while on symb and max gerd rx to sort out her symptoms at peak ex/ advised   Total time devoted to counseling  > 50 % of initial 60 min office visit:  reviewed case with pt/I performed device teaching  using a teach back technique which also  extended face to face time for this visit (see above) / discussion of options/alternatives/ personally creating written customized instructions  in presence of pt  then going over those specific  Instructions directly with the pt including how to use all of the meds but in particular covering each new medication in detail and the difference between the maintenance= "automatic" meds and the prns using an action plan format for the latter (If this problem/symptom => do that organization reading Left to right).  Please see AVS from this visit for a full list of these instructions which I personally wrote for this pt and  are unique to this visit.

## 2019-06-24 ENCOUNTER — Other Ambulatory Visit: Payer: Self-pay | Admitting: Medical

## 2019-06-24 DIAGNOSIS — M8580 Other specified disorders of bone density and structure, unspecified site: Secondary | ICD-10-CM | POA: Diagnosis not present

## 2019-06-24 LAB — HM DEXA SCAN

## 2019-06-24 LAB — HM MAMMOGRAPHY

## 2019-06-24 NOTE — Telephone Encounter (Signed)
Is this ok to refill?  

## 2019-06-25 ENCOUNTER — Ambulatory Visit: Payer: BLUE CROSS/BLUE SHIELD | Admitting: Medical

## 2019-06-29 ENCOUNTER — Other Ambulatory Visit: Payer: Self-pay | Admitting: Medical

## 2019-07-01 DIAGNOSIS — Z20828 Contact with and (suspected) exposure to other viral communicable diseases: Secondary | ICD-10-CM | POA: Diagnosis not present

## 2019-07-01 NOTE — Telephone Encounter (Signed)
Patient is still taking this medication. She has done 3 of the self injections and she said it is working. The intensity of the migraines has decreased and the frequency has decreased as well. She would like to continue Ainovig as well as Naratriptan.

## 2019-07-01 NOTE — Telephone Encounter (Signed)
lmom for patient to call back about refill and to schedule an appointments

## 2019-07-10 ENCOUNTER — Encounter: Payer: Self-pay | Admitting: Medical

## 2019-07-11 ENCOUNTER — Encounter

## 2019-07-11 ENCOUNTER — Other Ambulatory Visit: Payer: BLUE CROSS/BLUE SHIELD | Admitting: Sports Medicine

## 2019-07-28 ENCOUNTER — Other Ambulatory Visit: Payer: Self-pay | Admitting: Medical

## 2019-08-26 ENCOUNTER — Telehealth: Payer: Self-pay | Admitting: Medical

## 2019-08-26 ENCOUNTER — Other Ambulatory Visit: Payer: Self-pay | Admitting: Medical

## 2019-08-26 NOTE — Telephone Encounter (Signed)
Barbara Odonnell -please see her email message to me.  Let us go ahead and get her on the schedule for a med check appointment.  She is asking to start the process for prior authorization.   I am okay with that, however she mentioned having her message she has been taking this the past month.  It is my impression that she has been taking this for several months now

## 2019-08-27 ENCOUNTER — Other Ambulatory Visit: Payer: Self-pay

## 2019-08-27 ENCOUNTER — Ambulatory Visit: Payer: Self-pay

## 2019-08-27 ENCOUNTER — Ambulatory Visit (INDEPENDENT_AMBULATORY_CARE_PROVIDER_SITE_OTHER): Payer: BLUE CROSS/BLUE SHIELD | Admitting: Sports Medicine

## 2019-08-27 ENCOUNTER — Other Ambulatory Visit: Payer: Self-pay | Admitting: Medical

## 2019-08-27 VITALS — BP 100/78 | Ht 67.0 in | Wt 151.0 lb

## 2019-08-27 DIAGNOSIS — M25561 Pain in right knee: Secondary | ICD-10-CM | POA: Diagnosis not present

## 2019-08-27 DIAGNOSIS — M25552 Pain in left hip: Secondary | ICD-10-CM | POA: Diagnosis not present

## 2019-08-27 NOTE — Progress Notes (Signed)
Pt for CC left hip and RT knee tightness  Hx of labral tear left hip in 2012 Since then has done well with conservative care Past several months increased training a lot Hip started feeling tight Took 3 weeks off and feels better  Right Knee Feel some pain and tightness along medial side Better with rest No pain with spin class twice weekly  ROS No locking of knee No giving way No persistent groin pain but occasionally gets a sharp pain  PE Pleasant F in NAD BP 100/78   Ht 5' 7"  (1.702 m)   Wt 151 lb (68.5 kg)   BMI 23.65 kg/m   Hip: left ROM IIR is 30 deg and ER 40 deg/ equal left and right Full Hip felxion IR: 5/5, ER: 5/5, Flexion: 5/5, Extension: 5/5, Abduction: 5/5, Adduction: 5/5 Pelvic alignment unremarkable to inspection and palpation. Standing hip rotation and gait without trendelenburg / unsteadiness. Greater trochanter without tenderness to palpation. No tenderness over piriformis and greater trochanter. No SI joint tenderness and normal minimal SI movement. FADIR neg for pain FABER neg for pain  RT knee  Normal to inspection with no erythema or effusion or obvious bony abnormalities. Palpation TTP over medial joint line mid area Otherwise normal with no warmth or joint line tenderness or patellar tenderness or condyle tenderness. ROM normal in flexion and extension and lower leg rotation. Ligaments with solid consistent endpoints including ACL, PCL, LCL, MCL. Negative Mcmurray's and provocative meniscal tests. Non painful patellar compression. Patellar and quadriceps tendons unremarkable. Hamstring and quadriceps strength is normal.  Ultrasound of Knee Mild hypoechoic change over medial meniscus at midline Very mild degenerative change No narrowing or spurring No suprapatellar pouch effusion Tendons normal  Ultrasound of Hip Labrum appears intact Femoral head is normal No joint effusion  Impression: likely some cartilage contusion of medial  knee; Hip scan was unremarkable  Ultrasound and interpretation by Wolfgang Phoenix. Oneida Alar, MD

## 2019-08-27 NOTE — Patient Instructions (Signed)
Right knee Medial meniscus shows contusion Minor splitting seen with aging cartilage  Spin and bike is excellent for this If swelling back off running for a week  Left Hip Labrum still seems intact No arthritis Strain of iliopsoas and thickening of capsule Again biking is helpful If too much catching that is when to back off running

## 2019-08-27 NOTE — Assessment & Plan Note (Signed)
Hypoechoic change suggests primarily contusion with swelling Minimal degenerative meniscal change  Plan continued rest  Rehab on bike Compression if needed for comfort

## 2019-08-27 NOTE — Assessment & Plan Note (Signed)
Likely she had a hip flexor strain with overtraining This looks normal on [place] Continue with spin and bike Gradually ease back into running

## 2019-08-29 NOTE — Telephone Encounter (Signed)
P.A. AIMOVIG completed

## 2019-08-30 ENCOUNTER — Ambulatory Visit: Payer: BC Managed Care – PPO | Admitting: Rheumatology

## 2019-08-30 NOTE — Progress Notes (Signed)
Office Visit Note  Patient: Barbara Odonnell             Date of Birth: June 18, 1976           MRN: [id number]             PCP: Carlena Hurl, PA-C Referring: Everett Graff, MD Visit Date: 09/13/2019 Occupation: Health coach manager  Subjective:  New Patient (Initial Visit) (Bone mass, bil hand tingling, Raynauds?, Valtrex)   History of Present Illness: Barbara Odonnell is a 43 y.o. female seen in consultation per request of Dr. Mancel Bale, GYN.  According to patient her symptoms are started with stress fractures while she was in high school.  She had bilateral tibia and right femoral neck fracture over 20 years.  She was active in sports.  The last fracture was in 2008.  She was also diagnosed with vitamin D deficiency and has been on vitamin D supplement.  She was treated with bisphosphonates for a short time.  She also gives history of Raynaud's phenomenon for the last several years.  She states her symptoms are started in college and then gradually getting worse.  She states she notices discoloration in her hands and feet.  She was diagnosed with celiac disease in 2003 and she has been on gluten-free diet since then.  She states she used to get frequent sinus infections and was diagnosed with EBV chronic infection by a psychiatrist.  And he placed her on Valtrex.  She has had less frequent infections since then.  She gets hives frequently especially at night.  He has seen dermatologist.  She was advised to remove all the allergens and was given a topical steroid cream which she uses sparingly.  She also takes Benadryl on as needed basis.  Activities of Daily Living:  Patient reports morning stiffness for 0 none.   Patient Denies nocturnal pain.  Difficulty dressing/grooming: Denies Difficulty climbing stairs: Denies Difficulty getting out of chair: Denies Difficulty using hands for taps, buttons, cutlery, and/or writing: Reports  Review of Systems  Constitutional: Positive for fatigue.  Negative for night sweats, weight gain and weight loss.  HENT: Negative for mouth sores, trouble swallowing, trouble swallowing, mouth dryness and nose dryness.   Eyes: Positive for dryness. Negative for pain, redness and visual disturbance.  Respiratory: Negative for cough, shortness of breath and difficulty breathing.   Cardiovascular: Negative for chest pain, palpitations, hypertension, irregular heartbeat and swelling in legs/feet.  Gastrointestinal: Negative for blood in stool, constipation and diarrhea.  Endocrine: Negative for excessive thirst and increased urination.  Genitourinary: Negative for difficulty urinating and vaginal dryness.  Musculoskeletal: Negative for arthralgias, joint pain, joint swelling, myalgias, muscle weakness, morning stiffness, muscle tenderness and myalgias.  Skin: Positive for rash. Negative for color change, hair loss, skin tightness, ulcers and sensitivity to sunlight.  Allergic/Immunologic: Positive for susceptible to infections.  Neurological: Positive for numbness and headaches. Negative for dizziness, memory loss, night sweats and weakness.  Hematological: Negative for bruising/bleeding tendency and swollen glands.  Psychiatric/Behavioral: Positive for sleep disturbance. Negative for depressed mood. The patient is not nervous/anxious.     PMFS History:  Patient Active Problem List   Diagnosis Date Noted  . Hx of colonic polyp 09/13/2019  . Acute hip pain, left 08/27/2019  . Vaccine counseling 03/11/2019  . Respiratory tract infection 03/11/2019  . History of colitis 03/11/2019  . Polyp of colon 03/11/2019  . DOE (dyspnea on exertion) 02/27/2019  . Allergic rhinitis due to pollen 02/04/2019  .  Knee meniscus pain, right 08/23/2018  . Family history of osteoporosis 11/28/2017  . History of stress fracture 11/28/2017  . Asthma, chronic, mild persistent, uncomplicated 11/08/3233  . GERD (gastroesophageal reflux disease) 04/08/2016  . Hypothyroidism  10/08/2015  . Chronic migraine without aura without status migrainosus, not intractable 10/08/2015  . Routine general medical examination at a health care facility 10/08/2015  . Celiac disease 10/08/2015  . Vitamin D deficiency 10/08/2015  . Iron deficiency anemia 10/08/2015  . HIP FRACTURE, RIGHT 05/08/2007    Past Medical History:  Diagnosis Date  . Allergy   . Anemia    IV iron in past, prior to 2018  . Asthma   . Celiac disease    +prior biopsy  . GERD (gastroesophageal reflux disease)   . H/O bone density study 12/21/2017   normal  . Hip fracture ([place]) 2008   right, Dr. Oneida Alar  . History of stress test [address]  . Hypothyroidism   . Migraine headache without aura    gets bad nausea.  does well on Naratriptan and Relpax.  Failed Imitrex, Maxalt  . Mood change    Wellbutrin, has used Zoloft prior  . Stress fracture    severeral prior  . Transfusion history    has had IV iron prior, Dr. Marin Olp prior management  . Wears contact lenses     Family History  Problem Relation Age of Onset  . Hypertension Mother   . Osteopenia Mother   . Raynaud syndrome Mother   . Celiac disease Mother   . Cancer Father        asbestos related lung cancer  . Heart disease Father 40  . Pulmonary embolism Father   . Celiac disease Brother   . Celiac disease Brother   . Stroke Maternal Grandmother   . COPD Maternal Grandmother   . Osteopenia Maternal Grandmother   . Heart disease Maternal Grandfather   . Stroke Maternal Grandfather   . Heart disease Paternal Grandfather        died 10yo  . Diabetes Neg Hx    Past Surgical History:  Procedure Laterality Date  . COLONOSCOPY  2015   [facility], advised to repeat q5 years; x 4 prior  . ESOPHAGOGASTRODUODENOSCOPY     x5 prior as of 2016  . INGUINAL HERNIA REPAIR     left  . NASAL SEPTUM SURGERY    . POLYPECTOMY     sigmoid colon  . SINOSCOPY    . WISDOM TOOTH EXTRACTION     Social History   Social History  Narrative   Lives alone, works professor at [employer], now counseling at [employer],  Exercise - 6-7 days per week, teaches spin at [employer], swam competitively in college, marathon runner.      [facility]:   Originally from [place]. Has also lived in [place], [place], & [place]. Currently working in counseling. No pets currently. No bird exposure. No mold or hot tub exposure. Currently has carpet in the bedroom. No draperies. No exposure to asbestos.    Immunization History  Administered Date(s) Administered  . HPV Quadrivalent 10/31/2006  . Influenza Split 09/17/2012, 07/26/2013  . Influenza,inj,Quad PF,6+ Mos 11/24/2016, 10/28/2018  . Influenza-Unspecified 10/04/2015     Objective: Vital Signs: BP 102/75 (BP Location: Right Arm, Patient Position: Sitting, Cuff Size: Normal)   Pulse 83   Resp 14   Ht 5' 7.25" (1.708 m)   Wt 154 lb 8 oz (70.1 kg)  BMI 24.02 kg/m    Physical Exam Vitals signs and nursing note reviewed.  Constitutional:      Appearance: She is well-developed.  HENT:     Head: Normocephalic and atraumatic.  Eyes:     Conjunctiva/sclera: Conjunctivae normal.  Neck:     Musculoskeletal: Normal range of motion.  Cardiovascular:     Rate and Rhythm: Normal rate and regular rhythm.     Heart sounds: Normal heart sounds.  Pulmonary:     Effort: Pulmonary effort is normal.     Breath sounds: Normal breath sounds.  Abdominal:     General: Bowel sounds are normal.     Palpations: Abdomen is soft.  Lymphadenopathy:     Cervical: No cervical adenopathy.  Skin:    General: Skin is warm and dry.     Capillary Refill: Capillary refill takes 2 to 3 seconds.  Neurological:     Mental Status: She is alert and oriented to person, place, and time.  Psychiatric:        Behavior: Behavior normal.      Musculoskeletal Exam: C-spine, thoracic and lumbar spine with good range of motion.  Shoulder joints, elbow joints, wrist joints, MCPs PIPs and DIPs with good  range of motion with no synovitis.  Hip joints, knee joints, ankles, MTPs PIPs been good range of motion with no synovitis.  CDAI Exam: CDAI Score: - Patient Global: -; Provider Global: - Swollen: -; Tender: - Joint Exam   No joint exam has been documented for this visit   There is currently no information documented on the homunculus. Go to the Rheumatology activity and complete the homunculus joint exam.  Investigation: No additional findings.  Imaging: No results found.  Recent Labs: Lab Results  Component Value Date   WBC 5.0 02/27/2019   HGB 15.9 (H) 02/27/2019   PLT 227 02/27/2019   NA 137 02/27/2019   K 3.8 02/27/2019   CL 103 02/27/2019   CO2 26 02/27/2019   GLUCOSE 92 02/27/2019   BUN 9 02/27/2019   CREATININE 0.96 02/27/2019   BILITOT 0.4 02/11/2019   ALKPHOS 43 02/11/2019   AST 18 02/11/2019   ALT 14 02/11/2019   PROT 6.7 02/11/2019   ALBUMIN 4.2 02/11/2019   CALCIUM 9.2 02/27/2019   GFRAA >60 02/27/2019    Speciality Comments: No specialty comments available.  Procedures:  No procedures performed Allergies: Sulfa antibiotics and Singulair [montelukast sodium]   Assessment / Plan:     Visit Diagnoses: Raynaud's disease without gangrene -patient gives history of Raynaud's phenomenon in her hands and feet.  She is concerned that she may have some underlying autoimmune disease.  I discussed that 15% of female population has primary Raynaud's.  She does not have any other clinical features of autoimmune disease.  I will obtain following labs today.  Plan: ESR, ANA, Scl-70, RNP Antibody, Anti-Smith antibody, Ro, La, Ds DNA, C3 and C4, Beta-2 GP1, acl, Lupus Anticoagulant Eval w/Reflex  Osteopenia of multiple sites - 06/24/19: L femoral neck T-score -1.5 -patient gives history of a stress fracture in her bilateral tibia and her right femoral neck in the past.  She has not had a fracture in many years.  She has been on vitamin D and had been doing resistive  exercises.  She states her vitamin D has been stable now.  I have advised her to bring her bone density report at the next visit so we can see the percentage change in her BMD.  Plan:  PTH, Serum protein electrophoresis with reflex, Phosphorus  History of stress fracture-bilateral tibia and right femoral neck over  20 years.  Vitamin D deficiency-she is on vitamin D supplement.  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  She has been on Valtrex since then.  She believes that she has had less sinus infections since she has been on Valtrex.  She has several questions regarding chronic EBV infection.  I will refer her to infectious disease.   Orders: Orders Placed This Encounter  Procedures  . CBC with diff  . CMP with GFR  . ESR  . CK (Creatine Kinase)  . ANA  . Scl-70  . RNP Antibody  . Anti-Smith antibody  . Ro  . La  . Ds DNA  . C3 and C4  . Beta-2 GP1  . acl  . Lupus Anticoagulant Eval w/Reflex  . PTH  . Serum protein electrophoresis with reflex  . Phosphorus  . Ambulatory referral to Infectious Disease   No orders of the defined types were placed in this encounter.   Face-to-face time spent with patient was 50 minutes. Greater than 50% of time was spent in counseling and coordination of care.  Follow-Up Instructions: Return for Raynaud's, osteopenia.   Bo Merino, MD  Note - This record has been created using Editor, commissioning.  Chart creation errors have been sought, but may not always  have been located. Such creation errors do not reflect on  the standard of  medical care.

## 2019-09-02 LAB — HM COLONOSCOPY

## 2019-09-02 NOTE — Telephone Encounter (Signed)
P.A. approved til 10/30/38, called pharmacy went thru for $5, pt informed

## 2019-09-07 NOTE — Telephone Encounter (Signed)
P.A. handled and approved and went thru for $5

## 2019-09-13 ENCOUNTER — Encounter: Payer: Self-pay | Admitting: Rheumatology

## 2019-09-13 ENCOUNTER — Other Ambulatory Visit: Payer: Self-pay

## 2019-09-13 ENCOUNTER — Ambulatory Visit (INDEPENDENT_AMBULATORY_CARE_PROVIDER_SITE_OTHER): Payer: BC Managed Care – PPO | Admitting: Rheumatology

## 2019-09-13 VITALS — BP 102/75 | HR 83 | Resp 14 | Ht 67.25 in | Wt 154.5 lb

## 2019-09-13 DIAGNOSIS — J4530 Mild persistent asthma, uncomplicated: Secondary | ICD-10-CM

## 2019-09-13 DIAGNOSIS — M8589 Other specified disorders of bone density and structure, multiple sites: Secondary | ICD-10-CM | POA: Diagnosis not present

## 2019-09-13 DIAGNOSIS — D509 Iron deficiency anemia, unspecified: Secondary | ICD-10-CM

## 2019-09-13 DIAGNOSIS — I7300 Raynaud's syndrome without gangrene: Secondary | ICD-10-CM

## 2019-09-13 DIAGNOSIS — Z8619 Personal history of other infectious and parasitic diseases: Secondary | ICD-10-CM

## 2019-09-13 DIAGNOSIS — Z87312 Personal history of (healed) stress fracture: Secondary | ICD-10-CM | POA: Diagnosis not present

## 2019-09-13 DIAGNOSIS — Z8639 Personal history of other endocrine, nutritional and metabolic disease: Secondary | ICD-10-CM

## 2019-09-13 DIAGNOSIS — R5383 Other fatigue: Secondary | ICD-10-CM | POA: Diagnosis not present

## 2019-09-13 DIAGNOSIS — K900 Celiac disease: Secondary | ICD-10-CM

## 2019-09-13 DIAGNOSIS — G43709 Chronic migraine without aura, not intractable, without status migrainosus: Secondary | ICD-10-CM

## 2019-09-13 DIAGNOSIS — E559 Vitamin D deficiency, unspecified: Secondary | ICD-10-CM | POA: Diagnosis not present

## 2019-09-13 DIAGNOSIS — Z86010 Personal history of colonic polyps: Secondary | ICD-10-CM | POA: Insufficient documentation

## 2019-09-13 DIAGNOSIS — Z8719 Personal history of other diseases of the digestive system: Secondary | ICD-10-CM

## 2019-09-17 LAB — PROTEIN ELECTROPHORESIS, SERUM, WITH REFLEX
Albumin ELP: 4.7 g/dL (ref 3.8–4.8)
Alpha 1: 0.3 g/dL (ref 0.2–0.3)
Alpha 2: 0.8 g/dL (ref 0.5–0.9)
Beta 2: 0.3 g/dL (ref 0.2–0.5)
Beta Globulin: 0.5 g/dL (ref 0.4–0.6)
Gamma Globulin: 1.1 g/dL (ref 0.8–1.7)
Total Protein: 7.8 g/dL (ref 6.1–8.1)

## 2019-09-17 LAB — CBC WITH DIFFERENTIAL/PLATELET
Absolute Monocytes: 280 cells/uL (ref 200–950)
Basophils Absolute: 80 cells/uL (ref 0–200)
Basophils Relative: 1 %
Eosinophils Absolute: 112 cells/uL (ref 15–500)
Eosinophils Relative: 1.4 %
HCT: 43.3 % (ref 35.0–45.0)
Hemoglobin: 15.1 g/dL (ref 11.7–15.5)
Lymphs Abs: 1088 cells/uL (ref 850–3900)
MCH: 34.7 pg — ABNORMAL HIGH (ref 27.0–33.0)
MCHC: 34.9 g/dL (ref 32.0–36.0)
MCV: 99.5 fL (ref 80.0–100.0)
MPV: 12.1 fL (ref 7.5–12.5)
Monocytes Relative: 3.5 %
Neutro Abs: 6440 cells/uL (ref 1500–7800)
Neutrophils Relative %: 80.5 %
Platelets: 233 10*3/uL (ref 140–400)
RBC: 4.35 10*6/uL (ref 3.80–5.10)
RDW: 11.7 % (ref 11.0–15.0)
Total Lymphocyte: 13.6 %
WBC: 8 10*3/uL (ref 3.8–10.8)

## 2019-09-17 LAB — PHOSPHORUS: Phosphorus: 2.9 mg/dL (ref 2.5–4.5)

## 2019-09-17 LAB — BETA-2 GLYCOPROTEIN ANTIBODIES
Beta-2 Glyco 1 IgA: <9 SAU (ref <=20)
Beta-2 Glyco 1 IgM: <9 SMU (ref <=20)
Beta-2 Glyco I IgG: <9 SGU (ref <=20)

## 2019-09-17 LAB — LUPUS ANTICOAGULANT EVAL W/ REFLEX
PTT-LA Screen: 25 s (ref <=40)
dRVVT: 41 s (ref <=45)

## 2019-09-17 LAB — COMPLETE METABOLIC PANEL WITH GFR
AG Ratio: 1.7 (calc) (ref 1.0–2.5)
ALT: 9 U/L (ref 6–29)
AST: 17 U/L (ref 10–35)
Albumin: 4.7 g/dL (ref 3.6–5.1)
Alkaline phosphatase (APISO): 45 U/L (ref 31–125)
BUN/Creatinine Ratio: 10 (calc) (ref 6–22)
BUN: 12 mg/dL (ref 7–25)
CO2: 23 mmol/L (ref 20–32)
Calcium: 9.9 mg/dL (ref 8.6–10.2)
Chloride: 104 mmol/L (ref 98–110)
Creat: 1.17 mg/dL — ABNORMAL HIGH (ref 0.50–1.10)
GFR, Est African American: 65 mL/min/{1.73_m2} (ref >=60)
GFR, Est Non African American: 56 mL/min/{1.73_m2} — ABNORMAL LOW (ref >=60)
Globulin: 2.8 g/dL (calc) (ref 1.9–3.7)
Glucose, Bld: 76 mg/dL (ref 65–99)
Potassium: 3.9 mmol/L (ref 3.5–5.3)
Sodium: 141 mmol/L (ref 135–146)
Total Bilirubin: 0.6 mg/dL (ref 0.2–1.2)
Total Protein: 7.5 g/dL (ref 6.1–8.1)

## 2019-09-17 LAB — CARDIOLIPIN ANTIBODIES, IGG, IGM, IGA
Anticardiolipin IgA: <11 [APL'U]
Anticardiolipin IgG: <14 [GPL'U]
Anticardiolipin IgM: <12 [MPL'U]

## 2019-09-17 LAB — RNP ANTIBODY: Ribonucleic Protein(ENA) Antibody, IgG: <1 AI

## 2019-09-17 LAB — C3 AND C4
C3 Complement: 119 mg/dL (ref 83–193)
C4 Complement: 31 mg/dL (ref 15–57)

## 2019-09-17 LAB — ANTI-SMITH ANTIBODY: ENA SM Ab Ser-aCnc: <1 AI

## 2019-09-17 LAB — ANTI-DNA ANTIBODY, DOUBLE-STRANDED: ds DNA Ab: <1 IU/mL

## 2019-09-17 LAB — SJOGRENS SYNDROME-A EXTRACTABLE NUCLEAR ANTIBODY: SSA (Ro) (ENA) Antibody, IgG: <1 AI

## 2019-09-17 LAB — ANA: Anti Nuclear Antibody (ANA): POSITIVE — AB

## 2019-09-17 LAB — SEDIMENTATION RATE: Sed Rate: 2 mm/h (ref 0–20)

## 2019-09-17 LAB — CK: Total CK: 114 U/L (ref 29–143)

## 2019-09-17 LAB — ANTI-NUCLEAR AB-TITER (ANA TITER): ANA Titer 1: 1:40 {titer} — ABNORMAL HIGH

## 2019-09-17 LAB — PARATHYROID HORMONE, INTACT (NO CA): PTH: 81 pg/mL — ABNORMAL HIGH (ref 14–64)

## 2019-09-17 LAB — SJOGRENS SYNDROME-B EXTRACTABLE NUCLEAR ANTIBODY: SSB (La) (ENA) Antibody, IgG: <1 AI

## 2019-09-17 LAB — ANTI-SCLERODERMA ANTIBODY: Scleroderma (Scl-70) (ENA) Antibody, IgG: <1 AI

## 2019-09-17 NOTE — Progress Notes (Signed)
I will discuss results at the follow-up visit.

## 2019-09-20 ENCOUNTER — Ambulatory Visit: Payer: BC Managed Care – PPO | Admitting: Rheumatology

## 2019-09-20 ENCOUNTER — Other Ambulatory Visit: Payer: Self-pay

## 2019-09-20 ENCOUNTER — Encounter: Payer: Self-pay | Admitting: Medical

## 2019-09-20 NOTE — Progress Notes (Signed)
Virtual Visit via Video Note  I connected with Barbara Odonnell on 10/04/19 at 11:15 AM EST by a video enabled telemedicine application and verified that I am speaking with the correct person using two identifiers.  Location: Patient: Home  Provider:Clinic   This service was conducted via virtual visit.  Both audio and visual tools were used.  The patient was located at home. I was located in my office.  Consent was obtained prior to the virtual visit and is aware of possible charges through their insurance for this visit.  The patient is an established patient.  Dr. Estanislado Pandy, MD conducted the virtual visit and Hazel Sams, PA-C acted as scribe during the service.  Office staff helped with scheduling follow up visits after the service was conducted.   I discussed the limitations of evaluation and management by telemedicine and the availability of in person appointments. The patient expressed understanding and agreed to proceed.  CC: Discuss lab work  History of Present Illness: Patient is a 43 year old female with a past medical history of Raynaud's and osteopenia. She continues to have intermittent symptoms of raynaud's.  She denies any ulcerations.  She recently bought hand warmers to use.  She states that her blood pressure typically runs on the lower side and she occasionally experiences orthostatic hypotension while exercising.  She is taking a vitamin D supplement and will start taking a calcium supplement.   Review of Systems  Constitutional: Negative for fever and malaise/fatigue.  Eyes: Negative for photophobia, pain, discharge and redness.  Respiratory: Negative for cough, shortness of breath and wheezing.   Cardiovascular: Negative for chest pain and palpitations.  Gastrointestinal: Negative for blood in stool, constipation and diarrhea.  Genitourinary: Negative for dysuria.  Musculoskeletal: Negative for back pain, joint pain, myalgias and neck pain.  Skin: Negative for rash.        +Raynaud's   Neurological: Negative for dizziness and headaches.  Psychiatric/Behavioral: Negative for depression. The patient is not nervous/anxious and does not have insomnia.       Observations/Objective: Physical Exam  Constitutional: She is oriented to person, place, and time and well-developed, well-nourished, and in no distress.  HENT:  Head: Normocephalic and atraumatic.  Eyes: Conjunctivae are normal.  Pulmonary/Chest: Effort normal.  Neurological: She is alert and oriented to person, place, and time.  Psychiatric: Mood, memory, affect and judgment normal.   Patient reports morning stiffness for 0  minutes.   Patient denies nocturnal pain.  Difficulty dressing/grooming: Denies Difficulty climbing stairs: Denies Difficulty getting out of chair: Denies Difficulty using hands for taps, buttons, cutlery, and/or writing: Denies   September 13, 2019 SPEP normal, CK 114, ESR 2, PTH 81 (14-64), phosphorus 2.9 ANA 1: 40 nuclear, speckled, ENA negative, C3-C4 normal, anticardiolipin negative, beta-2 negative, lupus anticoagulant negative  Assessment / Plan:     Visit Diagnoses: Raynaud's disease without gangrene - All autoimmune work-up negative. Lab work from 09/13/19 was reviewed with the patient and all questions were addressed. We discussed that 15% of the normal population has primary Raynaud's. She continues to have intermittent symptoms of raynaud's.  She has no digital ulcerations or signs of gangrene.  We discussed keeping her core body temperature warm and wearing gloves/socks. Battery operated gloves were discussed. Her blood pressure is well controlled, and she occasionally experiences orthostatic hypotension while exercising.  We do not think she would be a good candidate for amlodipine at this time.  We will send in a one time prescription  for topical nitroglycerin ointment, which she can apply sparingly and monitor her BP closely. Instructions were discussed.  We also  discussed taking aspirin 81 mg daily, but she is apprehensive due to her history of low GFR (56 on 09/13/19, WNL on 09/24/19).    She has no other clinical features of autoimmune disease at this time.  Her labs are not consistent with an underlying autoimmune disease at this time.  She was advised to notify us if she develops any new or worsening symptoms.  She will follow up as needed.    Osteopenia of multiple sites - 06/24/19: L femoral neck T-score -1.5.  History of a stress fracture in the past.  No recent fractures many years.  She is taking a vitamin D supplement.    History of stress fracture - History of bilateral tibia and right femoral neck in the last 20 years.  Vitamin D deficiency - She is on vitamin D supplement.  Other medical conditions are listed as follows:   Celiac disease  Iron deficiency anemia, unspecified iron deficiency anemia type  History of gastroesophageal reflux (GERD)  Asthma, chronic, mild persistent, uncomplicated  Chronic migraine without aura without status migrainosus, not intractable  History of hypothyroidism  History of Epstein-Barr virus infection - Chronic use of Valtrex.  Patient was referred to ID.     Follow Up Instructions: She will follow up PRN   I discussed the assessment and treatment plan with the patient. The patient was provided an opportunity to ask questions and all were answered. The patient agreed with the plan and demonstrated an understanding of the instructions.   The patient was advised to call back or seek an in-person evaluation if the symptoms worsen or if the condition fails to improve as anticipated.  I provided 25 minutes of non-face-to-face time during this encounter.   Bo Merino, MD   Scribed by-  Hazel Sams, PA-C

## 2019-09-24 ENCOUNTER — Ambulatory Visit: Payer: BC Managed Care – PPO | Admitting: Medical

## 2019-09-24 ENCOUNTER — Encounter: Payer: Self-pay | Admitting: Medical

## 2019-09-24 ENCOUNTER — Other Ambulatory Visit: Payer: Self-pay

## 2019-09-24 VITALS — BP 120/82 | HR 80 | Temp 96.8°F | Ht 67.0 in | Wt 155.6 lb

## 2019-09-24 DIAGNOSIS — R300 Dysuria: Secondary | ICD-10-CM

## 2019-09-24 DIAGNOSIS — G43709 Chronic migraine without aura, not intractable, without status migrainosus: Secondary | ICD-10-CM | POA: Diagnosis not present

## 2019-09-24 DIAGNOSIS — Z23 Encounter for immunization: Secondary | ICD-10-CM | POA: Diagnosis not present

## 2019-09-24 DIAGNOSIS — Z1159 Encounter for screening for other viral diseases: Secondary | ICD-10-CM | POA: Diagnosis not present

## 2019-09-24 DIAGNOSIS — L299 Pruritus, unspecified: Secondary | ICD-10-CM

## 2019-09-24 DIAGNOSIS — E039 Hypothyroidism, unspecified: Secondary | ICD-10-CM

## 2019-09-24 DIAGNOSIS — R7989 Other specified abnormal findings of blood chemistry: Secondary | ICD-10-CM | POA: Diagnosis not present

## 2019-09-24 DIAGNOSIS — I7300 Raynaud's syndrome without gangrene: Secondary | ICD-10-CM

## 2019-09-24 LAB — POCT URINALYSIS DIP (PROADVANTAGE DEVICE)
Bilirubin, UA: NEGATIVE
Blood, UA: NEGATIVE
Glucose, UA: NEGATIVE mg/dL
Ketones, POC UA: NEGATIVE mg/dL
Leukocytes, UA: NEGATIVE
Nitrite, UA: NEGATIVE
Protein Ur, POC: NEGATIVE mg/dL
Specific Gravity, Urine: 1.005
Urobilinogen, Ur: NEGATIVE
pH, UA: 7 (ref 5.0–8.0)

## 2019-09-24 NOTE — Progress Notes (Addendum)
Subjective: Chief Complaint  Patient presents with  . Follow-up    on medication    . Vaginal Pain    irritated and red with urinary issues   Here for several concerns.  She recently saw her rheumatologist and had a large panel of labs to screen for connective tissue disease or autoimmune disease.  She was diagnosed with Raynaud's phenomenon.  She was having hands looking completely pale and cold.  Apparently her creatinine kidney marker was abnormal.  She has several questions about this today.  She denies any prior history of kidney damage or kidney abnormality.  In light of the news she stopped several of her medicines last week including AcipHex, Valtrex, famotidine.  She does report taking ibuprofen, 2 tablets over-the-counter daily for the last 3 weeks  She notes that she drinks a lot of water in general, exercises regularly.  She exercises usually 6 to 7 days/week including she is a Pharmacist, hospital of spin class, she does weight training, strength training, she does a boot camp style class regularly she runs regularly, and she states that she could probably out run everybody in this building.    She notes for the last 6 to 9 months she has been itchy all over on a regular basis.  She saw dermatology in the last few months and they could not find anything wrong.  They gave her some steroid cream to use topically as needed but no obvious rash no obvious cause.  They chalked it up to some type of allergy or related to soap and hygiene products.  She does not have any animals at home.  She denies extremely dry skin.  She lives in apartment with a regular heat pump and not particularly dry environment.  She is worried about kidney disease  In general she has also had problems where her urethra feels swollen and sometimes gets itchiness in that area as well.  No concern for STD.  No vaginal discharge no burning with urination.  She sees gynecology and declines pelvic exam today.  Declines STD testing  today.  No blood in the urine.  No odor in the urine.  No recent antiboics.  Just feels inflamed when she urinates, feels inflamed in the general urethral area.  ETOH  1 drink every 2 week  She denies history of hepatitis infection and was actually vaccinated for hepatitis A and B in the past when she studied abroad in [place].   Past Medical History:  Diagnosis Date  . Allergy   . Anemia    IV iron in past, prior to 2018  . Asthma   . Celiac disease    +prior biopsy  . GERD (gastroesophageal reflux disease)   . H/O bone density study 12/21/2017   normal  . Hip fracture ([place]) 2008   right, Dr. Oneida Alar  . History of stress test [address]  . Hypothyroidism   . Migraine headache without aura    gets bad nausea.  does well on Naratriptan and Relpax.  Failed Imitrex, Maxalt  . Mood change    Wellbutrin, has used Zoloft prior  . Stress fracture    severeral prior  . Transfusion history    has had IV iron prior, Dr. Marin Olp prior management  . Wears contact lenses    Current Outpatient Medications on File Prior to Visit  Medication Sig Dispense Refill  . budesonide-formoterol (SYMBICORT) 160-4.5 MCG/ACT inhaler TAKE 2 PUFFS BY MOUTH TWICE A DAY 30.6 Inhaler 1  .  buPROPion (WELLBUTRIN XL) 150 MG 24 hr tablet TAKE 1 TABLET BY MOUTH EVERY DAY 90 tablet 0  . Cholecalciferol ([employer]S D3) 25 MCG (1000 UT) capsule Take 1 capsule (1,000 Units total) by mouth daily. 90 capsule 3  . Erenumab-aooe (AIMOVIG) 70 MG/ML SOAJ Inject 1 application into the skin every 30 (thirty) days. 1 mL 2  . etonogestrel-ethinyl estradiol (NUVARING) 0.12-0.015 MG/24HR vaginal ring Place 1 each vaginally every 28 (twenty-eight) days. Insert vaginally and leave in place for 3 consecutive weeks, then remove for 1 week.    . FeAspGl-FeFum-B12-FA-C-Succ Ac (FERREX 28) MISC TAKE 1 TABLET BY MOUTH EVERY DAY 28 each 0  . naratriptan (AMERGE) 2.5 MG tablet TAKE 1 TABLET AS NEEDED FOR MIGRAINE, MAY REPEAT AFTER 4 HOURS  (MAX 5MG IN 24HRS) *MAX OF 18 IN 30* 18 tablet 4  . ondansetron (ZOFRAN) 4 MG tablet Take 1 tablet (4 mg total) by mouth every 8 (eight) hours as needed for nausea or vomiting. 20 tablet 0  . Peak Flow Meter DEVI 1 each by Does not apply route daily. 1 each 0  . PROAIR HFA 108 (90 Base) MCG/ACT inhaler INHALE 2 INHALATIONS EVERY 6 HOURS AS NEEDED FOR WHEEZING/SHORTNESS OF BREATH 18 g 1  . Spacer/Aero-Holding Chambers (AEROCHAMBER MV) inhaler Use as instructed 1 each 0  . SYNTHROID 50 MCG tablet TAKE 1 TABLET BY MOUTH EVERY DAY 30 tablet 0  . famotidine (PEPCID) 40 MG tablet Take 1 tablet (40 mg total) by mouth every evening. (Patient not taking: Reported on 09/24/2019) 90 tablet 0  . RABEprazole (ACIPHEX) 20 MG tablet Take 1 tablet (20 mg total) by mouth daily. (Patient not taking: Reported on 09/24/2019) 90 tablet 3  . valACYclovir (VALTREX) 1000 MG tablet Take 1,000 mg by mouth 3 (three) times daily.     No current facility-administered medications on file prior to visit.    ROS as in subjective    Objective: BP 120/82   Pulse 80   Temp (!) 96.8 F (36 C)   Ht 5' 7"  (1.702 m)   Wt 155 lb 9.6 oz (70.6 kg)   SpO2 98%   BMI 24.37 kg/m   Wt Readings from Last 3 Encounters:  09/24/19 155 lb 9.6 oz (70.6 kg)  09/13/19 154 lb 8 oz (70.1 kg)  08/27/19 151 lb (68.5 kg)   General appearance: alert, no distress, WD/WN,  Skin: no obvious rash or unusual skin findings Abdomen: +bs, soft, non tender, non distended, no masses, no hepatomegaly, no splenomegaly Pulses: 2+ symmetric, upper and lower extremities, normal cap refill No ext edema GU declined    Assessment: Encounter Diagnoses  Name Primary?  . Elevated serum creatinine Yes  . Encounter for hepatitis C screening test for low risk patient   . Pruritic condition   . Chronic migraine without aura without status migrainosus, not intractable   . Hypothyroidism, unspecified type   . Raynaud's phenomenon without gangrene   .  Dysuria   . Need for influenza vaccination     Plan: We discussed her several concerns.  Additional labs today to help evaluate recent elevated serum creatinine.  We will screen for hepatitis infection, we will recheck the creatinine and GFR.  We discussed possible differential for abnormal kidney markers including autoimmune disease, infection, medication adverse effect/kidney injury.  For now avoid or limit NSAID use, continue to hydrate well.  We also discussed that she is a very active athletic person which likely is playing a role with some of  her kidney function showing up as elevated creatinine.  Pruritic condition-unclear etiology, prior dermatology consult for this with no clear cause.  Advise daily moisturizing lotion, continue to hydrate well.  Lab screening as below.  She does have hypothyroidism which can also cause itching.  Raynaud's phenomenon-managed by rheumatology, reviewed recent rheumatology notes and labs including large panel of autoimmune testing  Dysuria-declines pelvic exam or STD testing.  Urine culture sent  Counseled on the influenza virus vaccine.  Vaccine information sheet given.  Influenza vaccine given after consent obtained.   Monc[place]ath was seen today for follow-up and vaginal pain.  Diagnoses and all orders for this visit:  Elevated serum creatinine -     Renal Function Panel -     Hepatitis C antibody -     Hepatitis B surface antibody -     Hepatitis B surface antigen -     Ferritin -     B12 regular -     POCT Urinalysis DIP (Proadvantage Device)  Encounter for hepatitis C screening test for low risk patient -     Renal Function Panel -     Hepatitis C antibody -     Hepatitis B surface antibody -     Hepatitis B surface antigen  Pruritic condition -     Renal Function Panel -     Hepatitis C antibody -     Hepatitis B surface antibody -     Hepatitis B surface antigen -     Urine culture -     Ferritin -     B12 regular  Chronic migraine  without aura without status migrainosus, not intractable  Hypothyroidism, unspecified type -     TSH regular -     Ferritin -     B12 regular  Raynaud's phenomenon without gangrene  Dysuria -     Urine culture  Need for influenza vaccination -     Flu Vaccine QUAD 6+ mos PF IM (Fluarix Quad PF)

## 2019-09-25 ENCOUNTER — Other Ambulatory Visit: Payer: Self-pay | Admitting: Medical

## 2019-09-25 LAB — RENAL FUNCTION PANEL
Albumin: 4.3 g/dL (ref 3.8–4.8)
BUN/Creatinine Ratio: 8 — ABNORMAL LOW (ref 9–23)
BUN: 8 mg/dL (ref 6–24)
CO2: 23 mmol/L (ref 20–29)
Calcium: 9.8 mg/dL (ref 8.7–10.2)
Chloride: 101 mmol/L (ref 96–106)
Creatinine, Ser: 0.98 mg/dL (ref 0.57–1.00)
GFR calc Af Amer: 81 mL/min/{1.73_m2} (ref >59)
GFR calc non Af Amer: 70 mL/min/{1.73_m2} (ref >59)
Glucose: 77 mg/dL (ref 65–99)
Phosphorus: 3 mg/dL (ref 3.0–4.3)
Potassium: 5.1 mmol/L (ref 3.5–5.2)
Sodium: 137 mmol/L (ref 134–144)

## 2019-09-25 LAB — TSH: TSH: 1.67 u[IU]/mL (ref 0.450–4.500)

## 2019-09-25 LAB — VITAMIN B12: Vitamin B-12: 472 pg/mL (ref 232–1245)

## 2019-09-25 LAB — HEPATITIS B SURFACE ANTIBODY, QUANTITATIVE: Hepatitis B Surf Ab Quant: 263 m[IU]/mL (ref >9.9)

## 2019-09-25 LAB — HEPATITIS B SURFACE ANTIGEN: Hepatitis B Surface Ag: NEGATIVE

## 2019-09-25 LAB — URINE CULTURE

## 2019-09-25 LAB — HEPATITIS C ANTIBODY: Hep C Virus Ab: <0.1 s/co ratio (ref 0.0–0.9)

## 2019-09-25 LAB — FERRITIN: Ferritin: 16 ng/mL (ref 15–150)

## 2019-09-25 MED ORDER — FERROUS GLUCONATE 324 (38 FE) MG PO TABS: 324.0000 mg | ORAL_TABLET | Freq: Every day | ORAL | 1 refills | Status: DC

## 2019-09-25 MED ORDER — FERREX 28 PO MISC: 1.0000 | Freq: Every day | ORAL | 1 refills | Status: DC

## 2019-09-25 NOTE — Telephone Encounter (Signed)
Is this okay to refill? 

## 2019-09-28 ENCOUNTER — Other Ambulatory Visit: Payer: Self-pay | Admitting: Medical

## 2019-09-30 ENCOUNTER — Other Ambulatory Visit: Payer: Self-pay | Admitting: Medical

## 2019-09-30 DIAGNOSIS — E559 Vitamin D deficiency, unspecified: Secondary | ICD-10-CM

## 2019-09-30 DIAGNOSIS — R7989 Other specified abnormal findings of blood chemistry: Secondary | ICD-10-CM

## 2019-09-30 MED ORDER — BUPROPION HCL ER (XL) 150 MG PO TB24: 150.0000 mg | ORAL_TABLET | Freq: Every day | ORAL | 1 refills | Status: DC

## 2019-09-30 MED ORDER — PROAIR HFA 108 (90 BASE) MCG/ACT IN AERS: INHALATION_SPRAY | RESPIRATORY_TRACT | 1 refills | Status: DC

## 2019-10-01 ENCOUNTER — Telehealth: Payer: Self-pay | Admitting: Medical

## 2019-10-01 ENCOUNTER — Other Ambulatory Visit: Payer: Self-pay | Admitting: Medical

## 2019-10-01 MED ORDER — PROAIR HFA 108 (90 BASE) MCG/ACT IN AERS: INHALATION_SPRAY | RESPIRATORY_TRACT | 1 refills | Status: DC

## 2019-10-01 MED ORDER — FERREX 28 PO MISC: 1.0000 | Freq: Every day | ORAL | 1 refills | Status: DC

## 2019-10-01 NOTE — Telephone Encounter (Signed)
Left message for pt to call back. Per Audelia Acton pt needs a nurse lab visit for around 11/11/2018.

## 2019-10-03 ENCOUNTER — Other Ambulatory Visit: Payer: Self-pay

## 2019-10-03 ENCOUNTER — Encounter: Payer: Self-pay | Admitting: Medical

## 2019-10-03 ENCOUNTER — Ambulatory Visit: Payer: BC Managed Care – PPO | Admitting: Medical

## 2019-10-03 VITALS — HR 108 | Temp 98.3°F | Ht 67.0 in | Wt 150.0 lb

## 2019-10-03 DIAGNOSIS — L308 Other specified dermatitis: Secondary | ICD-10-CM

## 2019-10-03 DIAGNOSIS — L709 Acne, unspecified: Secondary | ICD-10-CM | POA: Diagnosis not present

## 2019-10-03 MED ORDER — BENZOYL PEROXIDE-ERYTHROMYCIN 5-3 % EX GEL: Freq: Two times a day (BID) | CUTANEOUS | 1 refills | Status: DC

## 2019-10-03 NOTE — Progress Notes (Signed)
  Subjective:     Patient ID: Barbara Odonnell, female   DOB: 10-16-1976, 43 y.o.   MRN: [id number]  This visit type was conducted due to national recommendations for restrictions regarding the COVID-19 Pandemic (e.g. social distancing) in an effort to limit this patient's exposure and mitigate transmission in our community.  Due to their co-morbid illnesses, this patient is at least at moderate risk for complications without adequate follow up.  This format is felt to be most appropriate for this patient at this time.    Documentation for virtual audio and video telecommunications through Zoom encounter:  The patient was located at home. The provider was located in the office. The patient did consent to this visit and is aware of possible charges through their insurance for this visit.  The other persons participating in this telemedicine service were none. Time spent on call was 20 minutes and in review of previous records 20 minutes total.  This virtual service is not related to other E/M service within previous 7 days.   HPI Chief Complaint  Patient presents with  . Acne    on her face after wearing her mask at the gym    Virtual consult for rash on face.  Having some acne problems from wearing her mask since covid precautions .  She teaches exercise classes at a gym, sweats very much does high intensity exercise.  She goes through 3 facemask during a 1 hour session.  She is constantly using towels.  She uses 4 different towels to keep dry on her face during the workout.  Breaking out around nose, chin, lip.  She has seen dermatology in the past for acne.  Her nightly regimen includes 0.025% retin A and daily hydroquinone 4%.  Lately she has been using some E-Mycin samples she had leftover from an old tube for individual spots of worse red acne bumps.  She wants to try something that will help clear up her face little better.   No other aggravating or relieving factors. No other  complaint.  Review of Systems As in subjective    Objective:   Physical Exam Due to coronavirus pandemic stay at home measures, patient visit was virtual and they were not examined in person.   Gen: wd, wn, nad She has scattered inflamed and irritated comedones on the chin, face and nasal folds       Assessment:     Encounter Diagnoses  Name Primary?  . Acne, unspecified acne type Yes  . Dermatitis associated with moisture        Plan:     She will begin medication below for the next few weeks instead of her current Retin-A and hydroquinone regimen for the time being.  She will continue daily facial wash.  She will continue to use towels and change out masks to minimize dampness and sweating of the face.  If worse redness or dryness she will call back and let me know so we can adjust her regimen.  Advise that it may take a few weeks to see significant improvement.  Call back within a month for update on symptoms  Barbara Odonnell was seen today for acne.  Diagnoses and all orders for this visit:  Acne, unspecified acne type  Dermatitis associated with moisture  Other orders -     benzoyl peroxide-erythromycin (BENZAMYCIN) gel; Apply topically 2 (two) times daily.

## 2019-10-04 ENCOUNTER — Telehealth (INDEPENDENT_AMBULATORY_CARE_PROVIDER_SITE_OTHER): Payer: BC Managed Care – PPO | Admitting: Rheumatology

## 2019-10-04 ENCOUNTER — Encounter: Payer: Self-pay | Admitting: Rheumatology

## 2019-10-04 ENCOUNTER — Other Ambulatory Visit: Payer: Self-pay

## 2019-10-04 DIAGNOSIS — E559 Vitamin D deficiency, unspecified: Secondary | ICD-10-CM | POA: Diagnosis not present

## 2019-10-04 DIAGNOSIS — Z87312 Personal history of (healed) stress fracture: Secondary | ICD-10-CM

## 2019-10-04 DIAGNOSIS — J4530 Mild persistent asthma, uncomplicated: Secondary | ICD-10-CM

## 2019-10-04 DIAGNOSIS — K900 Celiac disease: Secondary | ICD-10-CM

## 2019-10-04 DIAGNOSIS — Z8719 Personal history of other diseases of the digestive system: Secondary | ICD-10-CM

## 2019-10-04 DIAGNOSIS — Z8619 Personal history of other infectious and parasitic diseases: Secondary | ICD-10-CM

## 2019-10-04 DIAGNOSIS — Z8639 Personal history of other endocrine, nutritional and metabolic disease: Secondary | ICD-10-CM

## 2019-10-04 DIAGNOSIS — G43709 Chronic migraine without aura, not intractable, without status migrainosus: Secondary | ICD-10-CM

## 2019-10-04 DIAGNOSIS — M8589 Other specified disorders of bone density and structure, multiple sites: Secondary | ICD-10-CM | POA: Diagnosis not present

## 2019-10-04 DIAGNOSIS — I7300 Raynaud's syndrome without gangrene: Secondary | ICD-10-CM

## 2019-10-04 DIAGNOSIS — D509 Iron deficiency anemia, unspecified: Secondary | ICD-10-CM

## 2019-10-04 MED ORDER — "NITROGLYCERIN NICU 2% OINTMENT ": 1.0000 "application " | TOPICAL_OINTMENT | Freq: Three times a day (TID) | TRANSDERMAL | 0 refills | Status: DC

## 2019-10-04 NOTE — Addendum Note (Signed)
Addended by: Francis Gaines C on: 10/04/2019 12:18 PM   Modules accepted: Orders

## 2019-10-07 ENCOUNTER — Other Ambulatory Visit: Payer: Self-pay | Admitting: Cardiology

## 2019-10-07 DIAGNOSIS — Z20822 Contact with and (suspected) exposure to covid-19: Secondary | ICD-10-CM

## 2019-10-08 LAB — NOVEL CORONAVIRUS, NAA: SARS-CoV-2, NAA: NOT DETECTED

## 2019-10-09 ENCOUNTER — Telehealth: Payer: Self-pay | Admitting: Medical

## 2019-10-09 ENCOUNTER — Other Ambulatory Visit: Payer: Self-pay | Admitting: Medical

## 2019-10-09 DIAGNOSIS — R944 Abnormal results of kidney function studies: Secondary | ICD-10-CM

## 2019-10-09 NOTE — Telephone Encounter (Signed)
She wants to see nephrology, please refer to [facility] for consult and send the several recent lab results and my recent notes.

## 2019-10-10 ENCOUNTER — Other Ambulatory Visit: Payer: Self-pay | Admitting: Internal Medicine

## 2019-10-10 NOTE — Telephone Encounter (Signed)
I have put referral in and sent over notes

## 2019-10-10 NOTE — Telephone Encounter (Signed)
Is this okay to refill? 

## 2019-10-15 DIAGNOSIS — N94810 Vulvar vestibulitis: Secondary | ICD-10-CM | POA: Diagnosis not present

## 2019-10-15 DIAGNOSIS — N898 Other specified noninflammatory disorders of vagina: Secondary | ICD-10-CM | POA: Diagnosis not present

## 2019-10-15 DIAGNOSIS — N9489 Other specified conditions associated with female genital organs and menstrual cycle: Secondary | ICD-10-CM | POA: Diagnosis not present

## 2019-10-19 ENCOUNTER — Other Ambulatory Visit: Payer: Self-pay | Admitting: Medical

## 2019-10-21 ENCOUNTER — Other Ambulatory Visit: Payer: Self-pay | Admitting: Medical

## 2019-10-22 ENCOUNTER — Ambulatory Visit: Payer: BC Managed Care – PPO | Admitting: Internal Medicine

## 2019-10-22 ENCOUNTER — Ambulatory Visit: Payer: BC Managed Care – PPO | Admitting: Infectious Diseases

## 2019-10-22 ENCOUNTER — Other Ambulatory Visit: Payer: Self-pay

## 2019-10-22 ENCOUNTER — Encounter: Payer: Self-pay | Admitting: Internal Medicine

## 2019-10-22 DIAGNOSIS — J45909 Unspecified asthma, uncomplicated: Secondary | ICD-10-CM | POA: Insufficient documentation

## 2019-10-22 DIAGNOSIS — R5382 Chronic fatigue, unspecified: Secondary | ICD-10-CM

## 2019-10-22 DIAGNOSIS — G43809 Other migraine, not intractable, without status migrainosus: Secondary | ICD-10-CM

## 2019-10-22 DIAGNOSIS — G43909 Migraine, unspecified, not intractable, without status migrainosus: Secondary | ICD-10-CM | POA: Insufficient documentation

## 2019-10-23 DIAGNOSIS — R5382 Chronic fatigue, unspecified: Secondary | ICD-10-CM | POA: Insufficient documentation

## 2019-10-23 NOTE — Progress Notes (Signed)
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 Bo Merino  Assessment: I do not know the cause of her intermittent episodes of fatigue but I told her that they are not due to chronic Epstein-Barr virus infection.  Her pattern of antibodies is indicative of remote, inactive infection.  In the mid 1980s some researchers began to claim that EBV infection was the cause of chronic fatigue.  That therapy has been widely debunked by [facility] medicine.  I cannot explain why she feels better when taking valacyclovir other than possible placebo effect.  I told her that I would recommend staying off of it for now and seeing how she does.  Plan: 1. Recommend to stay off of valacyclovir 2. I have given her written information about chronic fatigue syndrome 3. She can follow-up here as needed  Patient Active Problem List   Diagnosis Date Noted  . Chronic fatigue 10/23/2019    Priority: High  . Elevated serum creatinine 09/24/2019    Priority: High  . Migraine headache 10/22/2019  . Asthma 10/22/2019  . Raynaud's phenomenon without gangrene 09/24/2019  . Pruritic condition 09/24/2019  . Dysuria 09/24/2019  . Hx of colonic polyp 09/13/2019  . Acute hip pain, left 08/27/2019  . Vaccine counseling 03/11/2019  . History of colitis 03/11/2019  . DOE (dyspnea on exertion) 02/27/2019  . Allergic rhinitis due to pollen 02/04/2019  . Knee meniscus pain, right 08/23/2018  . Family history of osteoporosis 11/28/2017  . History of stress fracture 11/28/2017  . Need for influenza vaccination 11/24/2016  . Asthma, chronic, mild persistent, uncomplicated 56/43/3295  . GERD (gastroesophageal reflux disease) 04/08/2016  . Hypothyroidism 10/08/2015  . Chronic migraine without aura without status migrainosus, not intractable 10/08/2015  . Encounter for hepatitis C screening test for low risk patient 10/08/2015   . Celiac disease 10/08/2015  . Vitamin D deficiency 10/08/2015  . Iron deficiency anemia 10/08/2015  . HIP FRACTURE, RIGHT 05/08/2007    Patient's Medications  New Prescriptions   No medications on file  Previous Medications   AIMOVIG 70 MG/ML SOAJ    INJECT 1 APPLICATION INTO THE SKIN EVERY 30 DAYS   BENZOYL PEROXIDE-ERYTHROMYCIN (BENZAMYCIN) GEL    Apply topically 2 (two) times daily.   BUDESONIDE-FORMOTEROL (SYMBICORT) 160-4.5 MCG/ACT INHALER    TAKE 2 PUFFS BY MOUTH TWICE A DAY   BUPROPION (WELLBUTRIN XL) 150 MG 24 HR TABLET    Take 1 tablet (150 mg total) by mouth daily.   CHOLECALCIFEROL (CVS D3) 25 MCG (1000 UT) CAPSULE    Take 1 capsule (1,000 Units total) by mouth daily.   ETONOGESTREL-ETHINYL ESTRADIOL (NUVARING) 0.12-0.015 MG/24HR VAGINAL RING    Place 1 each vaginally every 28 (twenty-eight) days. Insert vaginally and leave in place for 3 consecutive weeks, then remove for 1 week.   FAMOTIDINE (PEPCID) 40 MG TABLET    TAKE 1 TABLET BY MOUTH EVERY DAY IN THE EVENING   FEASPGL-FEFUM-B12-FA-C-SUCC AC (FERREX 28) MISC    Take 1 tablet by mouth daily.   NARATRIPTAN (AMERGE) 2.5 MG TABLET    TAKE 1 TABLET AS NEEDED FOR MIGRAINE, MAY REPEAT AFTER 4 HOURS (MAX 5MG IN 24HRS) *MAX OF 18 IN 30*   NITROGLYCERIN (NITROGLYN) 2 % OINT OINTMENT    Apply 1 application topically 3 (three) times daily. APPLY 1/4 INCH TOPICALLY TO AFFECTED FINGERTIPS 3 TIMES A DAY   ONDANSETRON (ZOFRAN) 4 MG TABLET  Take 1 tablet (4 mg total) by mouth every 8 (eight) hours as needed for nausea or vomiting.   PEAK FLOW METER DEVI    1 each by Does not apply route daily.   PROAIR HFA 108 (90 BASE) MCG/ACT INHALER    INHALE 2 INHALATIONS EVERY 6 HOURS AS NEEDED FOR WHEEZING/SHORTNESS OF BREATH   RABEPRAZOLE (ACIPHEX) 20 MG TABLET    Take 1 tablet (20 mg total) by mouth daily.   SPACER/AERO-HOLDING CHAMBERS (AEROCHAMBER MV) INHALER    Use as instructed   SYNTHROID 50 MCG TABLET    TAKE 1 TABLET BY MOUTH EVERY DAY    VALACYCLOVIR (VALTREX) 1000 MG TABLET    Take 1,000 mg by mouth 3 (three) times daily.  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  She has had intermittent episodes of extreme fatigue for at least the last 4 years.  She does not know of any triggers.  When they occur she will have trouble getting off of the couch.  They will usually resolve spontaneously after several days.  She is very high functioning and physically fit and has no problem running marathons or teaching spin classes.  She has 2 jobs and works full-time.  In 2016 she saw Dr. Jeneen Montgomery, a psychiatrist, in [place].  He did extensive testing on her including Epstein-Barr virus apologies.  Her viral capsid antigen IgM was negative.  Her early antigen IgG, viral capsid antigen IgG and nuclear antigen IgG were all positive.  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  She says that she did feel better after starting it and took it for most of the next year before deciding to stop.  Earlier this year she thought the episodes of fatigue were worse and more common and restarted it.  Her Epstein-Barr virus DNA PCR was negative in April of this year.  While being evaluated for other symptoms recently her creatinine was noted to be elevated at 1.17 with a decreased GFR of 56.  She became concerned that this might be due to the valacyclovir and stopped it.  She was referred here to discuss whether or not she needed to be on it.  Review of Systems: Review of Systems  Constitutional: Positive for malaise/fatigue. Negative for chills, diaphoresis, fever and weight loss.  Respiratory: Negative for cough.   Cardiovascular:  Negative for chest pain.  Skin: Negative for rash.  Neurological:       When she has the intermittent episodes of fatigue she says that she feels like a blanket is being placed over her head.  Endo/Heme/Allergies:       She has Raynaud's phenomenon.      Past Medical History:  Diagnosis Date  . Allergy   . Anemia    IV iron in past, prior to 2018  . Asthma   . Celiac disease    +prior biopsy  . GERD (gastroesophageal reflux disease)   . H/O bone density study 12/21/2017   normal  . Hip fracture ([place]) 2008   right, Dr. Oneida Alar  . History of stress test [address]  . Hypothyroidism   . Migraine headache without aura  gets bad nausea.  does well on Naratriptan and Relpax.  Failed Imitrex, Maxalt  . Mood change    Wellbutrin, has used Zoloft prior  . Stress fracture    severeral prior  . Transfusion history    has had IV iron prior, Dr. Marin Olp prior management  . Wears contact lenses     Social History   Tobacco Use  . Smoking status: Never Smoker  . Smokeless tobacco: Never Used  Substance Use Topics  . Alcohol use: Yes    Alcohol/week: 1.0 standard drinks    Types: 1 Glasses of wine per week    Comment: rare  . Drug use: No    Family History  Problem Relation Age of Onset  . Hypertension Mother   . Osteopenia Mother   . Raynaud syndrome Mother   . Celiac disease Mother   . Cancer Father        asbestos related lung cancer  . Heart disease Father 70  . Pulmonary embolism Father   . Celiac disease Brother   . Celiac disease Brother   . Stroke Maternal Grandmother   . COPD Maternal Grandmother   . Osteopenia Maternal Grandmother   . Heart disease Maternal Grandfather   . Stroke Maternal Grandfather   . Heart disease Paternal Grandfather        died 31yo  . Diabetes Neg Hx    Allergies  Allergen Reactions  . Sulfa Antibiotics Anaphylaxis  . Singulair [Montelukast Sodium]     Angry, irritable, out of skin feeling    OBJECTIVE: Vitals:    10/22/19 0903  BP: 123/79  Pulse: 82  Temp: 97.7 F (36.5 C)  TempSrc: Oral  Weight: 155 lb (70.3 kg)   Body mass index is 24.28 kg/m.   Physical Exam Constitutional:      Comments: She is in good spirits.  Cardiovascular:     Rate and Rhythm: Normal rate and regular rhythm.     Heart sounds: No murmur.  Pulmonary:     Effort: Pulmonary effort is normal.     Breath sounds: Normal breath sounds.  Musculoskeletal:        General: No swelling or tenderness.  Lymphadenopathy:     Head:     Right side of head: No submandibular adenopathy.     Left side of head: No submandibular adenopathy.     Cervical: No cervical adenopathy.     Upper Body:     Right upper body: No axillary or epitrochlear adenopathy.     Left upper body: No axillary or epitrochlear adenopathy.  Skin:    Findings: No rash.  Neurological:     General: No focal deficit present.  Psychiatric:        Mood and Affect: Mood normal.     Microbiology: No results found for this or any previous visit (from the past 240 hour(s)).  Michel Bickers, MD [facility] [phone] pager   [phone] cell 10/23/2019, 11:51 AM

## 2019-10-25 ENCOUNTER — Other Ambulatory Visit: Payer: Self-pay | Admitting: Medical

## 2019-10-28 ENCOUNTER — Other Ambulatory Visit: Payer: Self-pay | Admitting: Medical

## 2019-10-28 ENCOUNTER — Telehealth: Payer: Self-pay | Admitting: Internal Medicine

## 2019-10-28 NOTE — Telephone Encounter (Signed)
Pt insurance will not pay for symbicort, use advair or breo or trelegy. Send in new rx

## 2019-10-28 NOTE — Telephone Encounter (Signed)
I received a notice that insurance would no longer cover or was declining to cover certain preventative medications I am assuming for 2021  If insurance is denying these, she will need to call in sure and ask what the preferred medication is for 2021  Similar medicines include: Breo, Advair, Symbicort, Dulera   Other medications that are not as strong because they only include inhaled steroid only which is not the same efficacy as the medication she currently is taking would include:  Qvar Asmanex Flovent

## 2019-10-28 NOTE — Telephone Encounter (Signed)
Pt's insurance will cover Breo, Advair, and Trelegy

## 2019-10-31 ENCOUNTER — Telehealth: Payer: Self-pay

## 2019-10-31 NOTE — Telephone Encounter (Signed)
Pt. Called LM that her new insurance does not cover symbicort or the generic for symbicort, but it will cover Breo or Advair, per pt. She does ok on Breo but not ok on Advair.

## 2019-11-04 NOTE — Telephone Encounter (Signed)
Received fax from pt. Pharmacy stating that her symbicort needs to change to alternative which is Breo, advair, or Trelegy.

## 2019-11-05 NOTE — Telephone Encounter (Signed)
Let her know, see if she has a preference from prior experience before I send something in

## 2019-11-05 NOTE — Telephone Encounter (Signed)
Pt. Said she has prior experience with [facility] and never had a problem with it.

## 2019-11-06 ENCOUNTER — Other Ambulatory Visit: Payer: Self-pay | Admitting: Medical

## 2019-11-06 MED ORDER — BREO ELLIPTA 100-25 MCG/INH IN AEPB: 1.0000 | INHALATION_SPRAY | Freq: Every day | RESPIRATORY_TRACT | 5 refills | Status: DC

## 2019-11-06 NOTE — Telephone Encounter (Signed)
I sent breo to replace Symbicort when she runs out

## 2019-11-12 ENCOUNTER — Other Ambulatory Visit: Payer: Self-pay

## 2019-11-12 ENCOUNTER — Other Ambulatory Visit: Payer: BC Managed Care – PPO

## 2019-11-12 DIAGNOSIS — E559 Vitamin D deficiency, unspecified: Secondary | ICD-10-CM | POA: Diagnosis not present

## 2019-11-12 DIAGNOSIS — R7989 Other specified abnormal findings of blood chemistry: Secondary | ICD-10-CM | POA: Diagnosis not present

## 2019-11-13 LAB — BASIC METABOLIC PANEL
BUN/Creatinine Ratio: 10 (ref 9–23)
BUN: 10 mg/dL (ref 6–24)
CO2: 21 mmol/L (ref 20–29)
Calcium: 9.4 mg/dL (ref 8.7–10.2)
Chloride: 101 mmol/L (ref 96–106)
Creatinine, Ser: 1.03 mg/dL — ABNORMAL HIGH (ref 0.57–1.00)
GFR calc Af Amer: 75 mL/min/{1.73_m2} (ref >59)
GFR calc non Af Amer: 65 mL/min/{1.73_m2} (ref >59)
Glucose: 85 mg/dL (ref 65–99)
Potassium: 4.2 mmol/L (ref 3.5–5.2)
Sodium: 139 mmol/L (ref 134–144)

## 2019-11-13 LAB — VITAMIN D 25 HYDROXY (VIT D DEFICIENCY, FRACTURES): Vit D, 25-Hydroxy: 48.9 ng/mL (ref 30.0–100.0)

## 2019-11-14 ENCOUNTER — Other Ambulatory Visit: Payer: Self-pay | Admitting: Medical

## 2019-11-15 ENCOUNTER — Telehealth: Payer: Self-pay

## 2019-11-15 ENCOUNTER — Other Ambulatory Visit: Payer: Self-pay | Admitting: Physician Assistant

## 2019-11-15 NOTE — Telephone Encounter (Signed)
Referral faxed

## 2019-11-18 NOTE — Telephone Encounter (Signed)
ok 

## 2019-11-18 NOTE — Telephone Encounter (Signed)
Last Visit: 10/04/19 Next Visit: as needed   Okay to refill Nitro-BID?

## 2019-11-19 DIAGNOSIS — Z09 Encounter for follow-up examination after completed treatment for conditions other than malignant neoplasm: Secondary | ICD-10-CM | POA: Diagnosis not present

## 2019-11-19 DIAGNOSIS — R3915 Urgency of urination: Secondary | ICD-10-CM | POA: Diagnosis not present

## 2019-11-20 ENCOUNTER — Telehealth: Payer: Self-pay

## 2019-11-20 NOTE — Telephone Encounter (Signed)
I received a fax from the pts. Pharmacy stating that she needs a PA done on her Wellbutrin 150 XL I called her insurance company to start the PA but they told me one was not needed for this medication they can get the generic with out PA.

## 2019-11-22 ENCOUNTER — Other Ambulatory Visit: Payer: Self-pay | Admitting: Medical

## 2019-11-25 DIAGNOSIS — I7300 Raynaud's syndrome without gangrene: Secondary | ICD-10-CM | POA: Diagnosis not present

## 2019-11-25 DIAGNOSIS — E039 Hypothyroidism, unspecified: Secondary | ICD-10-CM | POA: Diagnosis not present

## 2019-11-25 DIAGNOSIS — R7989 Other specified abnormal findings of blood chemistry: Secondary | ICD-10-CM | POA: Diagnosis not present

## 2019-11-29 ENCOUNTER — Telehealth: Payer: Self-pay | Admitting: Medical

## 2019-11-29 NOTE — Telephone Encounter (Signed)
I have reviewed her nephrology notes.  Hopefully she got reassurance that she indeed doesn't seem to have any concerning kidney issue.

## 2019-11-29 NOTE — Telephone Encounter (Signed)
Message sent on mychart

## 2019-12-03 ENCOUNTER — Telehealth: Payer: Self-pay | Admitting: Medical

## 2019-12-03 DIAGNOSIS — E039 Hypothyroidism, unspecified: Secondary | ICD-10-CM

## 2019-12-03 DIAGNOSIS — L299 Pruritus, unspecified: Secondary | ICD-10-CM

## 2019-12-03 DIAGNOSIS — R944 Abnormal results of kidney function studies: Secondary | ICD-10-CM

## 2019-12-03 NOTE — Telephone Encounter (Signed)
Please see her email message, and schedule the nurse visit and order labs as requested.

## 2019-12-06 ENCOUNTER — Other Ambulatory Visit: Payer: BC Managed Care – PPO

## 2019-12-06 ENCOUNTER — Other Ambulatory Visit: Payer: Self-pay

## 2019-12-06 DIAGNOSIS — L299 Pruritus, unspecified: Secondary | ICD-10-CM | POA: Diagnosis not present

## 2019-12-06 DIAGNOSIS — E039 Hypothyroidism, unspecified: Secondary | ICD-10-CM | POA: Diagnosis not present

## 2019-12-06 DIAGNOSIS — R944 Abnormal results of kidney function studies: Secondary | ICD-10-CM | POA: Diagnosis not present

## 2019-12-07 LAB — BASIC METABOLIC PANEL
BUN/Creatinine Ratio: 12 (ref 9–23)
BUN: 12 mg/dL (ref 6–24)
CO2: 17 mmol/L — ABNORMAL LOW (ref 20–29)
Calcium: 9.5 mg/dL (ref 8.7–10.2)
Chloride: 103 mmol/L (ref 96–106)
Creatinine, Ser: 1.03 mg/dL — ABNORMAL HIGH (ref 0.57–1.00)
GFR calc Af Amer: 75 mL/min/{1.73_m2} (ref >59)
GFR calc non Af Amer: 65 mL/min/{1.73_m2} (ref >59)
Glucose: 84 mg/dL (ref 65–99)
Potassium: 4.4 mmol/L (ref 3.5–5.2)
Sodium: 138 mmol/L (ref 134–144)

## 2019-12-07 LAB — FERRITIN: Ferritin: 30 ng/mL (ref 15–150)

## 2019-12-15 ENCOUNTER — Other Ambulatory Visit: Payer: Self-pay | Admitting: Medical

## 2019-12-16 ENCOUNTER — Other Ambulatory Visit: Payer: Self-pay | Admitting: Medical

## 2019-12-28 ENCOUNTER — Other Ambulatory Visit: Payer: Self-pay | Admitting: Medical

## 2019-12-30 NOTE — Telephone Encounter (Signed)
Is this okay to refill? 

## 2020-01-08 ENCOUNTER — Other Ambulatory Visit: Payer: Self-pay | Admitting: Medical

## 2020-01-08 ENCOUNTER — Encounter: Payer: Self-pay | Admitting: Medical

## 2020-01-08 ENCOUNTER — Other Ambulatory Visit: Payer: Self-pay

## 2020-01-08 ENCOUNTER — Ambulatory Visit: Payer: BC Managed Care – PPO | Admitting: Medical

## 2020-01-08 VITALS — Ht 67.0 in | Wt 151.0 lb

## 2020-01-08 DIAGNOSIS — J45909 Unspecified asthma, uncomplicated: Secondary | ICD-10-CM | POA: Diagnosis not present

## 2020-01-08 DIAGNOSIS — J3489 Other specified disorders of nose and nasal sinuses: Secondary | ICD-10-CM | POA: Diagnosis not present

## 2020-01-08 DIAGNOSIS — R5382 Chronic fatigue, unspecified: Secondary | ICD-10-CM | POA: Diagnosis not present

## 2020-01-08 DIAGNOSIS — J301 Allergic rhinitis due to pollen: Secondary | ICD-10-CM

## 2020-01-08 DIAGNOSIS — Z8619 Personal history of other infectious and parasitic diseases: Secondary | ICD-10-CM

## 2020-01-08 MED ORDER — FLUCONAZOLE 150 MG PO TABS: ORAL_TABLET | ORAL | 0 refills | Status: DC

## 2020-01-08 MED ORDER — VALACYCLOVIR HCL 500 MG PO TABS: 500.0000 mg | ORAL_TABLET | Freq: Every day | ORAL | 1 refills | Status: DC

## 2020-01-08 MED ORDER — AMOXICILLIN 875 MG PO TABS: 875.0000 mg | ORAL_TABLET | Freq: Two times a day (BID) | ORAL | 0 refills | Status: DC

## 2020-01-08 NOTE — Progress Notes (Signed)
Subjective:     Patient ID: Barbara Odonnell, female   DOB: Aug 01, 1976, 44 y.o.   MRN: [id number]  This visit type was conducted due to national recommendations for restrictions regarding the COVID-19 Pandemic (e.g. social distancing) in an effort to limit this patient's exposure and mitigate transmission in our community.  Due to their co-morbid illnesses, this patient is at least at moderate risk for complications without adequate follow up.  This format is felt to be most appropriate for this patient at this time.    Documentation for virtual audio and video telecommunications through Zoom encounter:  The patient was located at home. The provider was located in the office. The patient did consent to this visit and is aware of possible charges through their insurance for this visit.  The other persons participating in this telemedicine service were none. Time spent on call was 20 minutes and in review of previous records 20 minutes total.  This virtual service is not related to other E/M service within previous 7 days.   HPI Chief Complaint  Patient presents with  . Sore Throat  . Sinus Problem    left nostril pain    Virtual consult for a few concerns.  Asthma has been fine of late, particularly in light of the fact she had to change to [facility].  She will give this more time but she prefers Symbicort.  Has not done as well on Advair.   Took that for years.    She had consults with nephrology and infectious disease regarding use of Valtrex.  There was some concern that Valtrex was causing a bump in the creatinine.  She takes this daily for history of Epstein-Barr infection and chronic fatigue.  She lately has been using 1000 mg 3 times a week of Valtrex.  Going from daily to 3 times a week she seems more sick and less well.  She notes for the past several days some left sinus pressure, sore throat mild, glands in neck feel swollen.  No cough.  No sneezing.   Nose has been a little runny.    Temp was 99 yesterday, 98.7 today.   Pulse ox at home normal, she has a pulse oximeter at home.  Been using allegra since Sunday.  Using saline nasal spray.  Has had some nausea, no vomiting.  Took 1/2 zofran last night.  No diarrhea, no change in smell or taste.   Has had both covid vaccines ,4 weeks out from 2nd covid vaccine.   Still wearing masks daily when in public.  She is worried about sinus infection.  No other aggravating or relieving factors. No other complaint.  Past Medical History:  Diagnosis Date  . Allergy   . Anemia    IV iron in past, prior to 2018  . Asthma   . Celiac disease    +prior biopsy  . GERD (gastroesophageal reflux disease)   . H/O bone density study 12/21/2017   normal  . Hip fracture ([place]) 2008   right, Dr. Oneida Alar  . History of stress test [address]  . Hypothyroidism   . Migraine headache without aura    gets bad nausea.  does well on Naratriptan and Relpax.  Failed Imitrex, Maxalt  . Mood change    Wellbutrin, has used Zoloft prior  . Stress fracture    severeral prior  . Transfusion history    has had IV iron prior, Dr. Marin Olp prior management  . Wears contact lenses  Review of Systems As in subjective    Objective:   Physical Exam Due to coronavirus pandemic stay at home measures, patient visit was virtual and they were not examined in person.   Ht 5' 7"  (1.702 m)   Wt 151 lb (68.5 kg)   BMI 23.65 kg/m       Assessment:     Encounter Diagnoses  Name Primary?  . Sinus pressure Yes  . Allergic rhinitis due to pollen, unspecified seasonality   . Chronic fatigue   . History of Epstein-Barr virus infection   . Asthma, unspecified asthma severity, unspecified whether complicated, unspecified whether persistent        Plan:     We discussed the difference between allergies and sinus infection in terms of clinical presentation, treatment and recommendations.  She will give her supportive care a little more try with  the Allegra which was just ordered a few days ago and nasal saline, rest, hydration.  We discussed worsening sinus symptoms that would prompt the use of amoxicillin.  If not much improved in the next week then call back  Chronic fatigue, history of Epstein-Barr virus-she is taking Valtrex for this regularly.  She had a consult with nephrology and infectious disease.  She had a consult with nephrology due to elevated creatinine that may have been related to Valtrex use.  After discussing with both specialist, she has been using at 1000 mg 3 times a week to limit the overall exposure to Valtrex and possible creatinine elevation.  Given the low half-life, I recommended maybe she try 250 mg daily suppressive therapy or maintenance therapy of Valtrex.  She will do this and let me know within the next month if this seems to work better for her.  She felt like the 3 times a week dosing was not working as well as she feels more "sick" or less well in the past month or so.    Asthma-she does much better on Symbicort but insurance denied this.  She currently is taking Breo.  She will get the Breo little bit more time but if she feels like it is not working as well she like to go back on Symbicort.  She has a long history of being on Advair and did not do as well on Advair.  Lavina was seen today for sore throat and sinus problem.  Diagnoses and all orders for this visit:  Sinus pressure  Allergic rhinitis due to pollen, unspecified seasonality  Chronic fatigue  History of Epstein-Barr virus infection  Asthma, unspecified asthma severity, unspecified whether complicated, unspecified whether persistent  Other orders -     valACYclovir (VALTREX) 500 MG tablet; Take 1 tablet (500 mg total) by mouth daily. -     amoxicillin (AMOXIL) 875 MG tablet; Take 1 tablet (875 mg total) by mouth 2 (two) times daily.  call report 72mo

## 2020-01-18 ENCOUNTER — Other Ambulatory Visit: Payer: Self-pay | Admitting: Medical

## 2020-01-22 ENCOUNTER — Other Ambulatory Visit: Payer: Self-pay | Admitting: Medical

## 2020-01-23 ENCOUNTER — Other Ambulatory Visit: Payer: Self-pay | Admitting: Medical

## 2020-01-23 ENCOUNTER — Telehealth: Payer: Self-pay

## 2020-01-23 NOTE — Telephone Encounter (Signed)
Adams see her message.  Can you help with this trying to get Symbicort approved.  She does much better with Symbicort.  She has failed several medications at this point

## 2020-01-23 NOTE — Telephone Encounter (Signed)
Ok I will work on this if you could send her in a new prescription for the Symbicort so the pharmacy can send me the PA form for the Symbicort.

## 2020-01-23 NOTE — Telephone Encounter (Signed)
Pt. Called LM stating that she would like to see if we can get her Symbicort approved by her insurance company now since she has also now tried [employer] for the past few months and it does not work as well for her asthma, she has also tried Advair in the past which did not work either. So she wanted to know if you could call in her a new script for Symbicort.

## 2020-01-24 ENCOUNTER — Other Ambulatory Visit: Payer: Self-pay | Admitting: Medical

## 2020-01-24 MED ORDER — BUDESONIDE-FORMOTEROL FUMARATE 160-4.5 MCG/ACT IN AERO: 2.0000 | INHALATION_SPRAY | Freq: Two times a day (BID) | RESPIRATORY_TRACT | 12 refills | Status: DC

## 2020-01-24 NOTE — Telephone Encounter (Signed)
Script sent  

## 2020-01-28 ENCOUNTER — Telehealth: Payer: Self-pay | Admitting: Internal Medicine

## 2020-01-28 NOTE — Telephone Encounter (Signed)
I called to check on the pts. Prescription for her Symbicort and the pharmacy said she was able to pick it up already and no PA was needed for the medication at this time.

## 2020-01-28 NOTE — Telephone Encounter (Signed)
Pt has upcoming appt and no future labs

## 2020-02-03 NOTE — Telephone Encounter (Signed)
Does she have a lab visit or in person visit?  If lab visit scheduled, please call and confirm this was to recheck BMET/basic metabolic and kidney lab correct?

## 2020-02-04 ENCOUNTER — Other Ambulatory Visit: Payer: BC Managed Care – PPO

## 2020-02-04 ENCOUNTER — Other Ambulatory Visit: Payer: Self-pay | Admitting: Medical

## 2020-02-04 ENCOUNTER — Telehealth: Payer: Self-pay | Admitting: Internal Medicine

## 2020-02-04 ENCOUNTER — Other Ambulatory Visit: Payer: Self-pay

## 2020-02-04 DIAGNOSIS — E559 Vitamin D deficiency, unspecified: Secondary | ICD-10-CM | POA: Diagnosis not present

## 2020-02-04 DIAGNOSIS — R7989 Other specified abnormal findings of blood chemistry: Secondary | ICD-10-CM | POA: Diagnosis not present

## 2020-02-04 DIAGNOSIS — D509 Iron deficiency anemia, unspecified: Secondary | ICD-10-CM | POA: Diagnosis not present

## 2020-02-04 NOTE — Telephone Encounter (Signed)
Pt checked out today and wanted to schedule her next visit June 8th for labs like GRF, Creatinine, ferritin, possibly vitamin d, possibly TSH,, I advised her that shane would have to put in orders before I could schedule. Please advise

## 2020-02-04 NOTE — Telephone Encounter (Signed)
Pt would like GFR, Creatinine, Vitamin D, and Ferritin checked

## 2020-02-04 NOTE — Telephone Encounter (Signed)
Pt coming in today. Trying to find out which labs she is wanting

## 2020-02-05 ENCOUNTER — Other Ambulatory Visit: Payer: Self-pay | Admitting: Medical

## 2020-02-05 DIAGNOSIS — J4530 Mild persistent asthma, uncomplicated: Secondary | ICD-10-CM

## 2020-02-05 DIAGNOSIS — E559 Vitamin D deficiency, unspecified: Secondary | ICD-10-CM

## 2020-02-05 DIAGNOSIS — R7989 Other specified abnormal findings of blood chemistry: Secondary | ICD-10-CM

## 2020-02-05 DIAGNOSIS — R5382 Chronic fatigue, unspecified: Secondary | ICD-10-CM

## 2020-02-05 DIAGNOSIS — E039 Hypothyroidism, unspecified: Secondary | ICD-10-CM

## 2020-02-05 LAB — BASIC METABOLIC PANEL
BUN/Creatinine Ratio: 10 (ref 9–23)
BUN: 10 mg/dL (ref 6–24)
CO2: 23 mmol/L (ref 20–29)
Calcium: 10.2 mg/dL (ref 8.7–10.2)
Chloride: 101 mmol/L (ref 96–106)
Creatinine, Ser: 0.96 mg/dL (ref 0.57–1.00)
GFR calc Af Amer: 82 mL/min/{1.73_m2} (ref >59)
GFR calc non Af Amer: 71 mL/min/{1.73_m2} (ref >59)
Glucose: 65 mg/dL (ref 65–99)
Potassium: 4.5 mmol/L (ref 3.5–5.2)
Sodium: 140 mmol/L (ref 134–144)

## 2020-02-05 LAB — VITAMIN D 25 HYDROXY (VIT D DEFICIENCY, FRACTURES): Vit D, 25-Hydroxy: 45.2 ng/mL (ref 30.0–100.0)

## 2020-02-05 LAB — FERRITIN: Ferritin: 17 ng/mL (ref 15–150)

## 2020-02-05 NOTE — Progress Notes (Signed)
bmt  

## 2020-02-22 ENCOUNTER — Other Ambulatory Visit: Payer: Self-pay | Admitting: Medical

## 2020-02-22 MED ORDER — ALBUTEROL SULFATE (2.5 MG/3ML) 0.083% IN NEBU: 2.5000 mg | INHALATION_SOLUTION | Freq: Four times a day (QID) | RESPIRATORY_TRACT | 2 refills | Status: DC | PRN

## 2020-02-26 ENCOUNTER — Ambulatory Visit: Payer: BC Managed Care – PPO | Attending: Internal Medicine

## 2020-02-26 DIAGNOSIS — Z20822 Contact with and (suspected) exposure to covid-19: Secondary | ICD-10-CM | POA: Diagnosis not present

## 2020-02-27 LAB — SARS-COV-2, NAA 2 DAY TAT

## 2020-02-27 LAB — NOVEL CORONAVIRUS, NAA: SARS-CoV-2, NAA: NOT DETECTED

## 2020-03-03 ENCOUNTER — Other Ambulatory Visit: Payer: Self-pay | Admitting: Medical

## 2020-03-11 ENCOUNTER — Telehealth: Payer: Self-pay | Admitting: Medical

## 2020-03-11 ENCOUNTER — Other Ambulatory Visit: Payer: Self-pay | Admitting: Medical

## 2020-03-11 MED ORDER — VALACYCLOVIR HCL 500 MG PO TABS: 500.0000 mg | ORAL_TABLET | Freq: Every day | ORAL | 3 refills | Status: DC

## 2020-03-11 NOTE — Telephone Encounter (Signed)
Pt called and states that the plan you came up with is working. She does need a refill of Valtrex 500 mg. Please send to [facility] [address] Pt can be reached at [phone]

## 2020-03-14 ENCOUNTER — Other Ambulatory Visit: Payer: Self-pay | Admitting: Medical

## 2020-03-16 ENCOUNTER — Telehealth: Payer: Self-pay | Admitting: Medical

## 2020-03-16 MED ORDER — FAMOTIDINE 40 MG PO TABS: ORAL_TABLET | ORAL | 2 refills | Status: DC

## 2020-03-16 NOTE — Telephone Encounter (Signed)
Pt called for refills of Synthroid and famotidine. [facility] to [facility] [address] [address]. Pt can be reached at [phone].

## 2020-03-16 NOTE — Telephone Encounter (Signed)
Medication has been sent.  

## 2020-03-22 ENCOUNTER — Other Ambulatory Visit: Payer: Self-pay | Admitting: Medical

## 2020-03-23 NOTE — Telephone Encounter (Signed)
Is this appropriate?  

## 2020-03-24 ENCOUNTER — Other Ambulatory Visit: Payer: Self-pay | Admitting: Medical

## 2020-04-07 ENCOUNTER — Other Ambulatory Visit: Payer: Self-pay

## 2020-04-07 ENCOUNTER — Other Ambulatory Visit: Payer: BC Managed Care – PPO

## 2020-04-07 ENCOUNTER — Other Ambulatory Visit: Payer: Self-pay | Admitting: Medical

## 2020-04-07 ENCOUNTER — Telehealth: Payer: Self-pay | Admitting: Medical

## 2020-04-07 DIAGNOSIS — E039 Hypothyroidism, unspecified: Secondary | ICD-10-CM

## 2020-04-07 DIAGNOSIS — E559 Vitamin D deficiency, unspecified: Secondary | ICD-10-CM

## 2020-04-07 DIAGNOSIS — R5382 Chronic fatigue, unspecified: Secondary | ICD-10-CM | POA: Diagnosis not present

## 2020-04-07 DIAGNOSIS — R7989 Other specified abnormal findings of blood chemistry: Secondary | ICD-10-CM | POA: Diagnosis not present

## 2020-04-07 NOTE — Telephone Encounter (Signed)
Pt wants to know if she can come back sept the 14th and have the same labs  BMP Vit d ferrtitin And tsh done   That will be 3 months and I will call her back to schedule that

## 2020-04-07 NOTE — Telephone Encounter (Signed)
It would make more sense to make that this is a physical visit and fasting labs as labs will probably be cheaper and will be diagnosis schedule physical/preventative care  I recommend just making this an appointment for physical and labs

## 2020-04-08 LAB — BASIC METABOLIC PANEL
BUN/Creatinine Ratio: 14 (ref 9–23)
BUN: 13 mg/dL (ref 6–24)
CO2: 24 mmol/L (ref 20–29)
Calcium: 9.3 mg/dL (ref 8.7–10.2)
Chloride: 99 mmol/L (ref 96–106)
Creatinine, Ser: 0.91 mg/dL (ref 0.57–1.00)
GFR calc Af Amer: 88 mL/min/{1.73_m2} (ref >59)
GFR calc non Af Amer: 76 mL/min/{1.73_m2} (ref >59)
Glucose: 79 mg/dL (ref 65–99)
Potassium: 5 mmol/L (ref 3.5–5.2)
Sodium: 135 mmol/L (ref 134–144)

## 2020-04-08 LAB — TSH: TSH: 1.58 u[IU]/mL (ref 0.450–4.500)

## 2020-04-08 LAB — VITAMIN D 25 HYDROXY (VIT D DEFICIENCY, FRACTURES): Vit D, 25-Hydroxy: 40.2 ng/mL (ref 30.0–100.0)

## 2020-04-08 LAB — FERRITIN: Ferritin: 32 ng/mL (ref 15–150)

## 2020-04-08 NOTE — Telephone Encounter (Signed)
I called the pt. And LM to call back to schedule CPE and she can get that lab work done then.

## 2020-04-21 ENCOUNTER — Other Ambulatory Visit: Payer: Self-pay | Admitting: Medical

## 2020-05-07 ENCOUNTER — Other Ambulatory Visit: Payer: Self-pay | Admitting: Medical

## 2020-05-31 ENCOUNTER — Other Ambulatory Visit: Payer: Self-pay | Admitting: Medical

## 2020-06-10 DIAGNOSIS — Z01419 Encounter for gynecological examination (general) (routine) without abnormal findings: Secondary | ICD-10-CM | POA: Diagnosis not present

## 2020-06-10 DIAGNOSIS — Z113 Encounter for screening for infections with a predominantly sexual mode of transmission: Secondary | ICD-10-CM | POA: Diagnosis not present

## 2020-06-10 DIAGNOSIS — Z124 Encounter for screening for malignant neoplasm of cervix: Secondary | ICD-10-CM | POA: Diagnosis not present

## 2020-06-10 DIAGNOSIS — R350 Frequency of micturition: Secondary | ICD-10-CM | POA: Diagnosis not present

## 2020-06-10 DIAGNOSIS — E213 Hyperparathyroidism, unspecified: Secondary | ICD-10-CM | POA: Diagnosis not present

## 2020-06-11 ENCOUNTER — Other Ambulatory Visit: Payer: Self-pay

## 2020-06-11 DIAGNOSIS — Z1231 Encounter for screening mammogram for malignant neoplasm of breast: Secondary | ICD-10-CM | POA: Diagnosis not present

## 2020-06-11 MED ORDER — PROAIR HFA 108 (90 BASE) MCG/ACT IN AERS: INHALATION_SPRAY | RESPIRATORY_TRACT | 0 refills | Status: DC

## 2020-06-15 ENCOUNTER — Encounter: Payer: Self-pay | Admitting: Medical

## 2020-06-15 LAB — HM PAP SMEAR: HM Pap smear: NEGATIVE

## 2020-06-16 ENCOUNTER — Encounter: Payer: Self-pay | Admitting: Medical

## 2020-06-17 ENCOUNTER — Other Ambulatory Visit: Payer: Self-pay | Admitting: Family Medicine

## 2020-06-18 NOTE — Telephone Encounter (Signed)
[facility]. Please advise [facility]

## 2020-06-19 ENCOUNTER — Telehealth: Payer: Self-pay | Admitting: Medical

## 2020-06-19 MED ORDER — ALBUTEROL SULFATE HFA 108 (90 BASE) MCG/ACT IN AERS: 2.0000 | INHALATION_SPRAY | Freq: Four times a day (QID) | RESPIRATORY_TRACT | 0 refills | Status: DC | PRN

## 2020-06-19 NOTE — Addendum Note (Signed)
Addended by: Carlena Hurl on: 06/19/2020 08:04 AM   Modules accepted: Orders

## 2020-06-19 NOTE — Telephone Encounter (Signed)
Called [employer] and form for P.A. was faxed, unable to complete P.A. online

## 2020-06-23 ENCOUNTER — Encounter: Payer: BC Managed Care – PPO | Admitting: Medical

## 2020-06-23 ENCOUNTER — Other Ambulatory Visit: Payer: Self-pay | Admitting: Medical

## 2020-06-26 NOTE — Telephone Encounter (Signed)
P.A. denied, states pt must have allergic reaction or side effect to the generic not a failure in order for Brand to be approved.  Left message for pt

## 2020-06-29 NOTE — Telephone Encounter (Signed)
Left another message explaining that it's not a failure to generic it actually has to be a side effect or allergic reaction.

## 2020-07-04 ENCOUNTER — Other Ambulatory Visit: Payer: Self-pay | Admitting: Medical

## 2020-07-10 DIAGNOSIS — R102 Pelvic and perineal pain: Secondary | ICD-10-CM | POA: Diagnosis not present

## 2020-07-14 ENCOUNTER — Other Ambulatory Visit: Payer: Self-pay | Admitting: Medical

## 2020-07-14 ENCOUNTER — Encounter: Payer: BC Managed Care – PPO | Admitting: Medical

## 2020-07-14 DIAGNOSIS — Z8739 Personal history of other diseases of the musculoskeletal system and connective tissue: Secondary | ICD-10-CM | POA: Diagnosis not present

## 2020-07-14 DIAGNOSIS — N393 Stress incontinence (female) (male): Secondary | ICD-10-CM | POA: Diagnosis not present

## 2020-07-14 DIAGNOSIS — R102 Pelvic and perineal pain: Secondary | ICD-10-CM | POA: Diagnosis not present

## 2020-07-15 ENCOUNTER — Telehealth: Payer: Self-pay | Admitting: Family Medicine

## 2020-07-15 NOTE — Telephone Encounter (Signed)
Jillene called the [employer] is working well for her, no allergic reaction.  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  Also she thinks she is allergic to dark alcohol and ciders, it breaks out her legs.

## 2020-07-16 ENCOUNTER — Other Ambulatory Visit: Payer: Self-pay | Admitting: Medical

## 2020-07-16 MED ORDER — VALACYCLOVIR HCL 500 MG PO TABS: 500.0000 mg | ORAL_TABLET | Freq: Every day | ORAL | 2 refills | Status: DC

## 2020-07-16 NOTE — Telephone Encounter (Signed)
Please update chart for allergies

## 2020-07-16 NOTE — Telephone Encounter (Signed)
Allergies updated.

## 2020-07-18 NOTE — Telephone Encounter (Signed)
Pt has been informed.

## 2020-07-23 ENCOUNTER — Encounter: Payer: BC Managed Care – PPO | Admitting: Medical

## 2020-08-04 LAB — HM MAMMOGRAPHY

## 2020-08-06 ENCOUNTER — Other Ambulatory Visit: Payer: Self-pay | Admitting: Medical

## 2020-08-11 ENCOUNTER — Other Ambulatory Visit: Payer: Self-pay | Admitting: Medical

## 2020-08-11 NOTE — Telephone Encounter (Signed)
Sent patient a message to see if she is still taking this

## 2020-08-14 ENCOUNTER — Other Ambulatory Visit: Payer: Self-pay | Admitting: Medical

## 2020-08-14 MED ORDER — FERREX 28 PO MISC: 1.0000 | Freq: Every day | ORAL | 0 refills | Status: DC

## 2020-08-14 MED ORDER — ALBUTEROL SULFATE HFA 108 (90 BASE) MCG/ACT IN AERS: 2.0000 | INHALATION_SPRAY | Freq: Four times a day (QID) | RESPIRATORY_TRACT | 0 refills | Status: DC | PRN

## 2020-08-19 ENCOUNTER — Other Ambulatory Visit: Payer: Self-pay | Admitting: Medical

## 2020-08-20 NOTE — Telephone Encounter (Signed)
Please advise due to one pill only being sent in. [facility]

## 2020-08-21 ENCOUNTER — Other Ambulatory Visit: Payer: Self-pay | Admitting: Medical

## 2020-08-21 ENCOUNTER — Telehealth: Payer: Self-pay | Admitting: Medical

## 2020-08-21 MED ORDER — ALBUTEROL SULFATE HFA 108 (90 BASE) MCG/ACT IN AERS: 2.0000 | INHALATION_SPRAY | Freq: Four times a day (QID) | RESPIRATORY_TRACT | 0 refills | Status: DC | PRN

## 2020-08-21 MED ORDER — AIMOVIG 70 MG/ML ~~LOC~~ SOAJ: SUBCUTANEOUS | 0 refills | Status: DC

## 2020-08-21 NOTE — Telephone Encounter (Signed)
Please schedule for fasting physical/CPX if last physical >1 year ago (please check).  If not let us go ahead and get her on the schedule within the next 2 months for a med check.  I refilled the injectable migraine medicine and albuterol.  The other medicine appears to have refills according to the chart record

## 2020-08-24 NOTE — Telephone Encounter (Signed)
Pt scheduled  

## 2020-08-29 ENCOUNTER — Other Ambulatory Visit: Payer: Self-pay | Admitting: Medical

## 2020-08-31 ENCOUNTER — Other Ambulatory Visit: Payer: Self-pay

## 2020-08-31 MED ORDER — LEVOTHYROXINE SODIUM 50 MCG PO TABS
50.0000 ug | ORAL_TABLET | Freq: Every day | ORAL | 0 refills | Status: DC
Start: 2020-08-31 — End: 2020-12-04

## 2020-09-04 ENCOUNTER — Telehealth: Payer: Self-pay

## 2020-09-04 NOTE — Telephone Encounter (Signed)
P.A. AIMOVIG

## 2020-09-07 ENCOUNTER — Other Ambulatory Visit: Payer: Self-pay | Admitting: Medical

## 2020-09-08 NOTE — Telephone Encounter (Signed)
P.A. approved til 09/05/21, left message for pt, faxed pharmacy

## 2020-09-15 ENCOUNTER — Ambulatory Visit: Payer: 59 | Admitting: Medical

## 2020-09-15 ENCOUNTER — Other Ambulatory Visit: Payer: Self-pay

## 2020-09-15 ENCOUNTER — Encounter: Payer: Self-pay | Admitting: Medical

## 2020-09-15 VITALS — BP 122/80 | HR 71 | Ht 67.0 in | Wt 158.2 lb

## 2020-09-15 DIAGNOSIS — D509 Iron deficiency anemia, unspecified: Secondary | ICD-10-CM | POA: Diagnosis not present

## 2020-09-15 DIAGNOSIS — G43709 Chronic migraine without aura, not intractable, without status migrainosus: Secondary | ICD-10-CM | POA: Diagnosis not present

## 2020-09-15 DIAGNOSIS — E039 Hypothyroidism, unspecified: Secondary | ICD-10-CM

## 2020-09-15 DIAGNOSIS — Z8781 Personal history of (healed) traumatic fracture: Secondary | ICD-10-CM

## 2020-09-15 DIAGNOSIS — E559 Vitamin D deficiency, unspecified: Secondary | ICD-10-CM

## 2020-09-15 DIAGNOSIS — Z8262 Family history of osteoporosis: Secondary | ICD-10-CM

## 2020-09-15 DIAGNOSIS — L299 Pruritus, unspecified: Secondary | ICD-10-CM

## 2020-09-15 DIAGNOSIS — Z0000 Encounter for general adult medical examination without abnormal findings: Secondary | ICD-10-CM | POA: Diagnosis not present

## 2020-09-15 DIAGNOSIS — J4540 Moderate persistent asthma, uncomplicated: Secondary | ICD-10-CM

## 2020-09-15 DIAGNOSIS — Z7185 Encounter for immunization safety counseling: Secondary | ICD-10-CM

## 2020-09-15 DIAGNOSIS — L509 Urticaria, unspecified: Secondary | ICD-10-CM

## 2020-09-15 DIAGNOSIS — Z86010 Personal history of colonic polyps: Secondary | ICD-10-CM

## 2020-09-15 DIAGNOSIS — Z23 Encounter for immunization: Secondary | ICD-10-CM

## 2020-09-15 DIAGNOSIS — Z13220 Encounter for screening for lipoid disorders: Secondary | ICD-10-CM

## 2020-09-15 DIAGNOSIS — K900 Celiac disease: Secondary | ICD-10-CM

## 2020-09-15 DIAGNOSIS — Z87312 Personal history of (healed) stress fracture: Secondary | ICD-10-CM

## 2020-09-15 DIAGNOSIS — J301 Allergic rhinitis due to pollen: Secondary | ICD-10-CM

## 2020-09-15 DIAGNOSIS — Z8719 Personal history of other diseases of the digestive system: Secondary | ICD-10-CM

## 2020-09-15 DIAGNOSIS — Z8619 Personal history of other infectious and parasitic diseases: Secondary | ICD-10-CM

## 2020-09-15 DIAGNOSIS — K219 Gastro-esophageal reflux disease without esophagitis: Secondary | ICD-10-CM

## 2020-09-15 MED ORDER — AIMOVIG 70 MG/ML ~~LOC~~ SOAJ: SUBCUTANEOUS | 11 refills | Status: DC

## 2020-09-15 NOTE — Patient Instructions (Signed)
Preventative Care for Adults - Female   Thank you for coming in for your well visit today, and thank you for trusting [place] with your care!  If you had a good experience today, please complete the surveys sent by [facility] and consider a review online such as Google or Health Grades, refer [place] to a friend  Specific Concerns: DENTIST RECOMMENDATION Dr. Jonna Coup, dentist [address], [place], [place] [address] [phone] [email or web address]  I have been seeing Dr. Donn Pierini for 18+ years and I see hygeinest Myriam Jacobson who is excellent.   She took my wife's position.  My wife worked there 35 years.     Maintain regular health and wellness exams:  A routine yearly physical is a good way to check in with your primary care provider about your health and preventive screening. It is also an opportunity to share updates about your health and any concerns you have, and receive a thorough all-over exam.   Most health insurance companies pay for at least some preventative services.  Check with your health plan for specific coverages.  What preventative services do women need?  Adult women should have their weight and blood pressure checked regularly.   Women age 48 and older should have their cholesterol levels checked regularly.  Women should be screened for cervical cancer with a Pap smear and pelvic exam beginning at either age 53, or 3 years after they become sexually activity.    Breast cancer screening generally begins at age 51 with a mammogram and breast exam by your primary care provider.    Beginning at age 85 and continuing to age 49, women should be screened for colorectal cancer.  Certain people may need continued testing until age 6.  Updating vaccinations is part of preventative care.  Vaccinations help protect against diseases such as the flu.  Osteoporosis is a disease in which the bones lose minerals and strength as we age. Women ages 70 and over should discuss this with their  caregivers, as should women after menopause who have other risk factors.  Lab tests are generally done as part of preventative care to screen for anemia and blood disorders, to screen for problems with the kidneys and liver, to screen for bladder problems, to check blood sugar, and to check your cholesterol level.  Preventative services generally include counseling about diet, exercise, avoiding tobacco, drugs, excessive alcohol consumption, and sexually transmitted infections.    Xrays and CT scans are not normally done as a preventative test, and most insurances do not pay for imaging for screening other than as discussed under cancer screens below.   On the other hand, if you have certain medical concerns, imaging may be necessary as a diagnostic test.   Your Medical Team Your medical team starts with [place], your PCP or primary care provider.  Please use our services for your routine care such as physicals, screenings, immunizations, sick visits, and your first stop for general medical concerns.  You can call our number for after hours information for urgent questions that may need attention but cannot wait til the next business day.    Urgent care-urgent cares exist to provide care when your primary care office would typically be closed such as evenings or weekends.   Urgent care is for evaluation of urgent medical problems that do not necessarily require emergency department care, but cannot wait til the next business day when we are open.  Emergency department care-please reserve emergency department care for serious, urgent,  possibly life-threatening medical problems.  This includes issues like possible stroke, heart attack, significant injury, mental health crisis, or other urgent need that requires immediate medical attention.     See your dentist office twice yearly for hygiene and cleaning visits.   Brush your teeth and floss your teeth daily.  See your eye doctor yearly for routine eye  exam and screenings for glaucoma and retinal disease.  See your gynecologist yearly if you do not have your female/gynecological exams at our office.     Vaccines:  Stay up to date with your tetanus shots and other required immunizations. You should have a booster for tetanus every 10 years. Be sure to get your flu shot every year, since 5%-20% of the [place] population comes down with the flu. The flu vaccine changes each year, so being vaccinated once is not enough. Get your shot in the fall, before the flu season peaks.   Other vaccines to consider:  Pneumococcal vaccine to protect against certain types of pneumonia.  This is normally recommended for adults age 65 or older.  However, adults younger than 46 years old with certain underlying conditions such as diabetes, heart or lung disease should also receive the vaccine.  Shingles vaccine to protect against Varicella Zoster if you are older than age 92, or younger than 46 years old with certain underlying illness.  If you have not had the Shingrix vaccine, please call your insurer to inquire about coverage for the Shingrix vaccine given in 2 doses.   Some insurers cover this vaccine after age 35, some cover this after age 15.  If your insurer covers this, then call to schedule appointment to have this vaccine here  Hepatitis A vaccine to protect against a form of infection of the liver by a virus acquired from food.  Hepatitis B vaccine to protect against a form of infection of the liver by a virus acquired from blood or body fluids, particularly if you work in health care.  If you plan to travel internationally, check with your local health department for specific vaccination recommendations.  Human Papilloma Virus or HPV causes cancer of the cervix, and other infections that can be transmitted from person to person. There is a vaccine for HPV, and males should get immunized between the ages of 42 and 61. It requires a series of 3 shots.    Covid/Coronavirus - Please consider vaccination for your benefit and to help prevent spread of Covid to those around you.       What should I know about Cancer screening? Many types of cancers can be detected early and may often be prevented. Lung Cancer  You should be screened every year for lung cancer if: ? You are a current smoker who has smoked for at least 30 years. ? You are a former smoker who has quit within the past 15 years.  Talk to your health care provider about your screening options, when you should start screening, and how often you should be screened.  Breast cancer screening is essential to preventive care for women. All women age 44 and older should perform a breast self-exam every month. At age 4 and older, women should have their caregiver complete a breast exam each year. Women at ages 14 and older should have a mammogram (x-ray film) of the breasts. Your caregiver can discuss how often you need mammograms.    Breast cancer screening   [facility]   Or  [place]   [phone]         [phone] [address], [place], [address] [place], [place] [address]        [place], [place] [address]  Cervical cancer screening includes taking a Pap smear (sample of cells examined under a microscope) from the cervix (end of the uterus). It also includes testing for HPV (Human Papilloma Virus, which can cause cervical cancer). Screening and a pelvic exam should begin at age 20, or 3 years after a woman becomes sexually active. Screening should occur every year, with a Pap smear but no HPV testing, up to age 4. After age 55, you should have a Pap smear every 3 years with HPV testing, if no HPV was found previously.   Colorectal Cancer  Routine colorectal cancer screening usually begins at 46 years of age and should be repeated every 5-10 years until you are 46 years old. You may need to be screened more often if  early forms of precancerous polyps or small growths are found. Your health care provider may recommend screening at an earlier age if you have risk factors for colon cancer.  Your health care provider may recommend using home test kits to check for hidden blood in the stool.  A small camera at the end of a tube can be used to examine your colon (sigmoidoscopy or colonoscopy). This checks for the earliest forms of colorectal cancer.  Skin Cancer  Check your skin from head to toe regularly.  Tell your health care provider about any new moles or changes in moles, especially if: ? There is a change in a mole's size, shape, or color. ? You have a mole that is larger than a pencil eraser.  Always use sunscreen. Apply sunscreen liberally and repeat throughout the day.  Protect yourself by wearing long sleeves, pants, a wide-brimmed hat, and sunglasses when outside.     GENERAL RECOMMENDATIONS FOR GOOD HEALTH:  Healthy diet:  Eat a variety of foods, including fruit, vegetables, animal or vegetable protein, such as meat, fish, chicken, and eggs, or beans, lentils, tofu, and grains, such as rice.  Drink plenty of water daily.  Decrease saturated fat in the diet, avoid lots of red meat, processed foods, sweets, fast foods, and fried foods.  Exercise:  Aerobic exercise helps maintain good heart health. At least 30-40 minutes of moderate-intensity exercise is recommended. For example, a brisk walk that increases your heart rate and breathing. This should be done on most days of the week.   Find a type of exercise or a variety of exercises that you enjoy so that it becomes a part of your daily life.  Examples are running, walking, swimming, water aerobics, and biking.  For motivation and support, explore group exercise such as aerobic class, spin class, Zumba, Yoga,or  martial arts, etc.    Set exercise goals for yourself, such as a certain weight goal, walk or run in a race such as a 5k  walk/run.  Speak to your primary care provider about exercise goals.    Disease prevention:  If you smoke or chew tobacco, find out from your caregiver how to quit. It can literally save your life, no matter how long you have been a tobacco user. If you do not use tobacco, never begin.   Maintain a healthy diet and normal weight. Increased weight leads to problems with blood pressure and diabetes.   The Body Mass  Index or BMI is a way of measuring how much of your body is fat. Having a BMI above 27 increases the risk of heart disease, diabetes, hypertension, stroke and other problems related to obesity. Your caregiver can help determine your BMI and based on it develop an exercise and dietary program to help you achieve or maintain this important measurement at a healthful level.  High blood pressure causes heart and blood vessel problems.  Persistent high blood pressure should be treated with medicine if weight loss and exercise do not work.    Fat and cholesterol leaves deposits in your arteries that can block them. This causes heart disease and vessel disease elsewhere in your body.  If your cholesterol is found to be high, or if you have heart disease or certain other medical conditions, then you may need to have your cholesterol monitored frequently and be treated with medication.   Ask if you should have a cardiac stress test if your history suggests this. A stress test is a test done on a treadmill that looks for heart disease. This test can find disease prior to there being a problem.  Menopause can be associated with physical symptoms and risks. Hormone replacement therapy is available to decrease these. You should talk to your caregiver about whether starting or continuing to take hormones is right for you.   Osteoporosis is a disease in which the bones lose minerals and strength as we age. This can result in serious bone fractures. Risk of osteoporosis can be identified using a bone  density scan. Women ages 28 and over should discuss this with their caregivers, as should women after menopause who have other risk factors. Ask your caregiver whether you should be taking a calcium supplement and Vitamin D, to reduce the rate of osteoporosis.   Osteoporosis screening/bone density testing:  [facility]   [phone]          [phone] [address] [place], [address] [place], [place] [address]        [place],  [phone]    Avoid drinking alcohol in excess (more than two drinks per day).  Avoid use of street drugs. Do not share needles with anyone. Ask for professional help if you need assistance or instructions on stopping the use of alcohol, cigarettes, and/or drugs.  Brush your teeth twice a day with fluoride toothpaste, and floss once a day. Good oral hygiene prevents tooth decay and gum disease. The problems can be painful, unattractive, and can cause other health problems. Visit your dentist for a routine oral and dental check up and preventive care every 6-12 months.    Spiritual and Emotional Health Keeping a healthy spiritual life can help you better manage your physical health. Your spiritual life can help you to cope with any issues that may arise with your physical health.  Balance can keep [place] healthy and help [place] to recover.  If you are struggling with your spiritual health there are questions that you may want to ask yourself:  What makes me feel most complete? When do I feel most connected to the rest of the world? Where do I find the most inner strength? What am I doing when I feel whole?  Helpful tips: . Being in nature. Some people feel very connected and at peace when they are walking outdoors or are outside. Marland Kitchen Helping others.  Some feel the largest sense of wellbeing when they are of service to others. Being of service can take on many forms. It can be doing  volunteer work, being kind to strangers, or offering a hand to a friend in need. . Gratitude. Some people find they feel the most connected when they remain grateful. They may make lists of all the things they are grateful for or say a thank you out loud for all they have.    Emotional Health Are you in tune with your emotional health?  Check out this link: http://www.bray.com/    Legal  Take the time to do a last will and testament, Advanced Directives including [place] and Living Will documents.  Don't leave your family with burdens that can be handled ahead of time.   Financial Health . Make sure you use a budget for your personal finances . Make sure you are insured against risks (health insurance, life insurance, auto insurance, etc) . Save more, spend less . Set financial goals . If you need help in this area, good resources include counseling through [employer] or other community resources, have a meeting with a Emergency planning/management officer, and a good resource is [employer] 10 reasons people come to the doctor's office:   (what is your "ounce of prevention")  Skin disorders; Osteoarthritis and joint disorders; Back problems; Cholesterol problems; Upper respiratory conditions, excluding asthma; Anxiety, depression, and bipolar disorder; Chronic neurologic disorders; High blood pressure; Headaches and migraines; and Diabetes.    Safety:  Use seatbelts 100% of the time, whether driving or as a passenger.  Use safety devices such as hearing protection if you work in environments with loud noise or significant background noise.  Use safety glasses when doing any work that could send debris in to the eyes.  Use a helmet if you ride a bike or motorcycle.  Use appropriate safety gear for contact sports.  Talk to your caregiver about gun safety.  Use sunscreen with a SPF (or skin protection factor) of 15 or greater.   Lighter skinned people are at a greater risk of skin cancer. Don't forget to also wear sunglasses in order to protect your eyes from too much damaging sunlight. Damaging sunlight can accelerate cataract formation.   Keep carbon monoxide and smoke detectors in your home functioning at all times. Change the batteries every 6 months or use a model that plugs into the wall.    Sexual activity: . Sex is a normal part of life and sexual activity can continue into older adulthood for many healthy people.   . If you are having issues related to sexual activity, please follow up to discuss this further.   . If you are not in a monogamous relationship or have more than one partner, please practice safe sex.  Use condoms. Condoms are used for birth control and to help reduce the spread of sexually transmitted infections (or STIs).  Some of the STIs are gonorrhea (the clap), chlamydia, syphilis, trichomonas, herpes, HPV (human papilloma virus) and HIV (human immunodeficiency virus) which causes AIDS. The herpes, HIV and HPV are viral illnesses that have no cure. These can result in disability, cancer and death.   We are able to test for STIs here at our office.

## 2020-09-15 NOTE — Progress Notes (Signed)
Subjective:   HPI  Barbara Odonnell is a 44 y.o. female who presents for Chief Complaint  Patient presents with  . Annual Exam    not fasting     Patient Care Team: Deby Adger, Camelia Eng, PA-C as PCP - General (Family Medicine)  [facility] Sees dentist Sees eye doctor  Concerns: She has several concerns.  Migraines - Amiovig has been a life saver.  That really works well.  Has really helped to reduce abortive use.   Sometimes worse migraines around menstrual periods.  Uses now typically 2 abortive pills per month. Uses Amerge for abortive therapy.    Asthma - doing fine.  Uses Symbicort 2 puffs once daily.  hasn't had any recent problems with wheezing or SOB.  Rinses with water after use, brushes tongue. Asthma is doing well, not interested to change or lower dosage of Symbicort.     GERD - currently just using pepcid.    Not currently using Aciphex.     Hypothyroid -compliant with medicine.  No particular issues  Anemia -compliant with iron regularly, no heavy bleeding menstrually  Doing fine on Wellbutrin.     Hypothyroidism - compliant with Synthroid without c/o.  Has a history of skin reaction, hives, allergies.  She thinks dark ciders and alcohols make her itch.  Thinks blue and gray dye can give her problems, hives.  History of celiac disease-diet controlled, diet has been gluten free since 2013  Right femoral neck fracture in the past, no prior surgery  She has history of stress fractures in both tibias in the past  Styes - seeing eye doctor.   Related to having to wear mask regularly.  Currently using warm compresses, occasional oral antibiotic.        Past Medical History:  Diagnosis Date  . Allergy   . Anemia    IV iron in past, prior to 2018  . Asthma   . Celiac disease    +prior biopsy  . GERD (gastroesophageal reflux disease)   . H/O bone density study 12/21/2017   normal  . Hip fracture ([place]) 2008   right, Dr. Oneida Alar  . History of  stress test [address]  . Hypothyroidism   . Migraine headache without aura    gets bad nausea.  does well on Naratriptan and Relpax.  Failed Imitrex, Maxalt  . Mood change    Wellbutrin, has used Zoloft prior  . Stress fracture    severeral prior  . Transfusion history    has had IV iron prior, Dr. Marin Olp prior management  . Wears contact lenses     Past Surgical History:  Procedure Laterality Date  . COLONOSCOPY  2015   [facility], advised to repeat q5 years; x 4 prior  . ESOPHAGOGASTRODUODENOSCOPY     x5 prior as of 2016  . INGUINAL HERNIA REPAIR     left  . NASAL SEPTUM SURGERY    . POLYPECTOMY     sigmoid colon  . SINOSCOPY    . WISDOM TOOTH EXTRACTION      Social History   Socioeconomic History  . Marital status: Single    Spouse name: Not on file  . Number of children: Not on file  . Years of education: Not on file  . Highest education level: Not on file  Occupational History  . Not on file  Tobacco Use  . Smoking status: Never Smoker  . Smokeless tobacco: Never Used  Vaping Use  .  Vaping Use: Never used  Substance and Sexual Activity  . Alcohol use: Yes    Alcohol/week: 1.0 standard drink    Types: 1 Glasses of wine per week    Comment: rare  . Drug use: No  . Sexual activity: Not on file  Other Topics Concern  . 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   Prior worked as professor at [employer]  Exercise:- 6-7 days per week, teaches spin at [employer], swam competitively in college, marathon runner.       [facility]:   Originally from [place]. Has also lived in [place], [place], & [place].  Currently working in counseling. No pets currently. No bird exposure. No mold or hot tub exposure. Currently has carpet in the bedroom. No draperies. No exposure to asbestos.      Social Determinants of Health   Financial  Resource Strain:   . Difficulty of Paying Living Expenses: Not on file  Food Insecurity:   . Worried About Charity fundraiser in the Last Year: Not on file  . Ran Out of Food in the Last Year: Not on file  Transportation Needs:   . Lack of Transportation (Medical): Not on file  . Lack of Transportation (Non-Medical): Not on file  Physical Activity:   . Days of Exercise per Week: Not on file  . Minutes of Exercise per Session: Not on file  Stress:   . Feeling of Stress : Not on file  Social Connections:   . Frequency of Communication with Friends and Family: Not on file  . Frequency of Social Gatherings with Friends and Family: Not on file  . Attends Religious Services: Not on file  . Active Member of Clubs or Organizations: Not on file  . Attends Archivist Meetings: Not on file  . Marital Status: Not on file  Intimate Partner Violence:   . Fear of Current or Ex-Partner: Not on file  . Emotionally Abused: Not on file  . Physically Abused: Not on file  . Sexually Abused: Not on file    Family History  Problem Relation Age of Onset  . Hypertension Mother   . Osteopenia Mother   . Raynaud syndrome Mother   . Celiac disease Mother   . Cancer Father        asbestos related lung cancer  . Heart disease Father 67  . Pulmonary embolism Father   . Celiac disease Brother   . Celiac disease Brother   . Stroke Maternal Grandmother   . COPD Maternal Grandmother   . Osteopenia Maternal Grandmother   . Heart disease Maternal Grandfather   . Stroke Maternal Grandfather   . Heart disease Paternal Grandfather        died 82yo  . Diabetes Neg Hx      Current Outpatient Medications:  .  albuterol (PROAIR HFA) 108 (90 Base) MCG/ACT inhaler, Inhale 2 puffs into the lungs every 6 (six) hours as needed for wheezing or shortness of breath., Disp: 54 g, Rfl: 0 .  albuterol (PROVENTIL) (2.5 MG/3ML) 0.083% nebulizer solution, Take 3 mLs (2.5 mg total) by nebulization every 6 (six)  hours as needed for wheezing or shortness of breath., Disp: 75 mL, Rfl: 2 .  benzoyl peroxide-erythromycin (BENZAMYCIN) gel, Apply topically 2 (two) times daily., Disp: 23.3 g, Rfl: 1 .  budesonide-formoterol (SYMBICORT) 160-4.5 MCG/ACT inhaler, Inhale 2 puffs  into the lungs in the morning and at bedtime., Disp: 1 Inhaler, Rfl: 12 .  buPROPion (WELLBUTRIN XL) 150 MG 24 hr tablet, TAKE 1 TABLET BY MOUTH EVERY DAY, Disp: 90 tablet, Rfl: 0 .  [facility] D3 25 MCG (1000 UT) capsule, TAKE 1 CAPSULE (1,000 UNITS TOTAL) BY MOUTH DAILY., Disp: 90 capsule, Rfl: 3 .  Erenumab-aooe (AIMOVIG) 70 MG/ML SOAJ, INJECT INTO THE SKIN ONCE EVERY 30 DAYS, Disp: 1 mL, Rfl: 11 .  etonogestrel-ethinyl estradiol (NUVARING) 0.12-0.015 MG/24HR vaginal ring, Place 1 each vaginally every 28 (twenty-eight) days. Insert vaginally and leave in place for 3 consecutive weeks, then remove for 1 week., Disp: , Rfl:  .  famotidine (PEPCID) 40 MG tablet, TAKE 1 TABLET BY MOUTH EVERY DAY IN THE EVENING, Disp: 30 tablet, Rfl: 2 .  FeAspGl-FeFum-B12-FA-C-Succ Ac (FERREX 28) MISC, Take 1 tablet by mouth daily., Disp: 90 each, Rfl: 0 .  levothyroxine (SYNTHROID) 50 MCG tablet, Take 1 tablet (50 mcg total) by mouth daily., Disp: 90 tablet, Rfl: 0 .  naratriptan (AMERGE) 2.5 MG tablet, TAKE 1 TABLET AS NEEDED FOR MIGRAINE, MAY REPEAT AFTER 4 HOURS (MAX 5MG IN 24HRS) *MAX OF 18 IN 30*, Disp: 18 tablet, Rfl: 0 .  ondansetron (ZOFRAN) 4 MG tablet, TAKE 1 TABLET BY MOUTH EVERY 8 HOURS AS NEEDED FOR NAUSEA AND VOMITING, Disp: 20 tablet, Rfl: 2 .  Peak Flow Meter DEVI, 1 each by Does not apply route daily., Disp: 1 each, Rfl: 0 .  Spacer/Aero-Holding Chambers (AEROCHAMBER MV) inhaler, Use as instructed, Disp: 1 each, Rfl: 0 .  triamcinolone (KENALOG) 0.1 %, triamcinolone acetonide 0.1 % topical cream, Disp: , Rfl:  .  valACYclovir (VALTREX) 500 MG tablet, Take 1 tablet (500 mg total) by mouth daily., Disp: 30 tablet, Rfl: 2 .  NITRO-BID 2 % ointment, APPLY  1/4 INCH TO AFFECTED FINGERTIPS 3 TIMES A DAY (Patient not taking: Reported on 01/08/2020), Disp: 30 g, Rfl: 0  Allergies  Allergen Reactions  . Sulfa Antibiotics Anaphylaxis  . Blue Dyes (Parenteral)   . Other Hives    Dark alcohol  . Singulair [Montelukast Sodium]     Angry, irritable, out of skin feeling    Reviewed their medical, surgical, family, social, medication, and allergy history and updated chart as appropriate.   Review of Systems Constitutional: -fever, -chills, -sweats, -unexpected weight change, -decreased appetite, -fatigue Allergy: -sneezing, -itching, -congestion Dermatology: -changing moles, --rash, -lumps ENT: -runny nose, -ear pain, -sore throat, -hoarseness, -sinus pain, -teeth pain, - ringing in ears, -hearing loss, -nosebleeds Cardiology: -chest pain, -palpitations, -swelling, -difficulty breathing when lying flat, -waking up short of breath Respiratory: -cough, -shortness of breath, -difficulty breathing with exercise or exertion, -wheezing, -coughing up blood Gastroenterology: -abdominal pain, -nausea, -vomiting, -diarrhea, -constipation, -blood in stool, -changes in bowel movement, -difficulty swallowing or eating Hematology: -bleeding, -bruising  Musculoskeletal: -joint aches, -muscle aches, -joint swelling, -back pain, -neck pain, -cramping, -changes in gait Ophthalmology: denies vision changes, eye redness, itching, discharge Urology: -burning with urination, -difficulty urinating, -blood in urine, -urinary frequency, -urgency, -incontinence Neurology: -headache, -weakness, -tingling, -numbness, -memory loss, -falls, -dizziness Psychology: -depressed mood, -agitation, -sleep problems Breast/gyn: -breast tenderness, -discharge, -lumps, no vaginal discharge,- irregular periods, -heavy periods     Objective:  BP 122/80   Pulse 71   Ht 5' 7"  (1.702 m)   Wt 158 lb 3.2 oz (71.8 kg)   SpO2 99%   BMI 24.78 kg/m     Wt Readings from Last 3 Encounters:   09/15/20  158 lb 3.2 oz (71.8 kg)  01/08/20 151 lb (68.5 kg)  10/22/19 155 lb (70.3 kg)   General appearance: alert, no distress, WD/WN, Caucasian female Skin: unremarkable HEENT: normocephalic, conjunctiva/corneas normal, sclerae anicteric, PERRLA, EOMi, nares patent, no discharge or erythema, pharynx normal Oral cavity: MMM, tongue normal, teeth in good repair Neck: supple, no lymphadenopathy, no thyromegaly, no masses, normal ROM, no bruits Chest: non tender, normal shape and expansion Heart: RRR, normal S1, S2, no murmurs Lungs: CTA bilaterally, no wheezes, rhonchi, or rales Abdomen: +bs, soft, umbilical piercing, non tender, non distended, no masses, no hepatomegaly, no splenomegaly, no bruits Back: non tender, normal ROM, no scoliosis Musculoskeletal: upper extremities non tender, no obvious deformity, normal ROM throughout, lower extremities non tender, no obvious deformity, normal ROM throughout Extremities: no edema, no cyanosis, no clubbing Pulses: 2+ symmetric, upper and lower extremities, normal cap refill Neurological: alert, oriented x 3, CN2-12 intact, strength normal upper extremities and lower extremities, sensation normal throughout, DTRs 2+ throughout, no cerebellar signs, gait normal Psychiatric: normal affect, behavior normal, pleasant  Breast/gyn/rectal - deferred to gynecology    Assessment and Plan :   Encounter Diagnoses  Name Primary?  . Encounter for health maintenance examination in adult Yes  . Vitamin D deficiency   . Vaccine counseling   . Iron deficiency anemia, unspecified iron deficiency anemia type   . Chronic migraine without aura without status migrainosus, not intractable   . Allergic rhinitis due to pollen, unspecified seasonality   . Moderate persistent asthma without complication   . Gastroesophageal reflux disease, unspecified whether esophagitis present   . Hypothyroidism, unspecified type   . Pruritic condition   . Family history of  osteoporosis   . History of Epstein-Barr virus infection   . History of stress fracture   . Hx of colonic polyp   . Celiac disease   . History of hip fracture   . History of colitis   . Need for influenza vaccination   . Need for Tdap vaccination   . Screening for lipid disorders   . Hives     Physical exam - discussed and counseled on healthy lifestyle, diet, exercise, preventative care, vaccinations, sick and well care, proper use of emergency dept and after hours care, and addressed their concerns.    Health screening: Advised they see their eye doctor yearly for routine vision care. Advised they see their dentist yearly for routine dental care including hygiene visits twice yearly. See your gynecologist yearly for routine gynecological care.   Today you had a preventative care visit or wellness visit.    Topics today may have included healthy lifestyle, diet, exercise, preventative care, vaccinations, sick and well care, proper use of emergency dept and after hours care, as well as other concerns.     Recommendations: Continue to return yearly for your annual wellness and preventative care visits.  This gives [place] a chance to discuss healthy lifestyle, exercise, vaccinations, review your chart record, and perform screenings where appropriate.  I recommend you see your eye doctor yearly for routine vision care.  I recommend you see your dentist yearly for routine dental care including hygiene visits twice yearly.   Vaccination recommendations were reviewed Counseled on the influenza virus vaccine.  Vaccine information sheet given.  Influenza vaccine given after consent obtained.  Counseled on the Tdap (tetanus, diptheria, and acellular pertussis) vaccine.  Vaccine information sheet given. Tdap vaccine given after consent obtained.  She notes prior pneumococcal 23 and 13 prior in  [facility] primary care  She is up to date on Covid vaccines and booster. No drug no other new  murmur noted not a past on wound    Screening for cancer: We will request last bone density, mammogram and pap from 2021 through [facility]   Breast cancer screening: You should perform a self breast exam monthly.   We reviewed recommendations for regular mammograms and breast cancer screening.  Colon cancer screening:  I reviewed your colonoscopy on file that is up to date from 2020  Cervical cancer screening: We reviewed recommendations for pap smear screening.  Skin cancer screening: Check your skin regularly for new changes, growing lesions, or other lesions of concern Come in for evaluation if you have skin lesions of concern.  Lung cancer screening: If you have a greater than 30 pack year history of tobacco use, then you qualify for lung cancer screening with a chest CT scan  We currently don't have screenings for other cancers besides breast, cervical, colon, and lung cancers.  If you have a strong family history of cancer or have other cancer screening concerns, please let me know.    Bone health: Get at least 150 minutes of aerobic exercise weekly Get weight bearing exercise at least once weekly  We will request the most recent bone density test through gynecology   Heart health: Get at least 150 minutes of aerobic exercise weekly Limit alcohol It is important to maintain a healthy blood pressure and healthy cholesterol numbers    Screening for sexually transmitted infections: Done through gynecology   Separate significant issues discussed: Migraines-doing fine Amiovig for prevention, Amerge for abortive therapy  Asthma - doing fine on Symbicort once daily, albuterol as needed  GERD - currently just using pepcid.    Not currently using Aciphex.     Hypothyroid -compliant with medicine.  Labs today  Anemia -compliant with iron regularly, no heavy bleeding menstrually, labs today  Anxiety - Doing fine on Wellbutrin.     Hypothyroidism -  compliant with Synthroid, labs today.  History of allergies, hives-has a history of skin reaction, hives, allergies.  She thinks dark ciders and alcohols make her itch.  Thinks blue and gray dye can give her problems, hives.  History of celiac disease-diet controlled, diet has been gluten free since 2013  Right femoral neck fracture in the past, no prior surgery  She has history of stress fractures in both tibias in the past  Styes - seeing eye doctor.   Related to having to wear mask regularly.  Currently using warm compresses, occasional oral antibiotic.      Samirah was seen today for annual exam.  Diagnoses and all orders for this visit:  Encounter for health maintenance examination in adult -     VITAMIN D 25 Hydroxy (Vit-D Deficiency, Fractures) -     Comprehensive metabolic panel -     Ferritin -     Iron -     CBC with Differential/Platelet -     TSH -     T4, free -     LDL Cholesterol, Direct  Vitamin D deficiency -     VITAMIN D 25 Hydroxy (Vit-D Deficiency, Fractures)  Vaccine counseling  Iron deficiency anemia, unspecified iron deficiency anemia type -     Ferritin -     Iron -     CBC with Differential/Platelet  Chronic migraine without aura without status migrainosus, not intractable  Allergic rhinitis due to pollen, unspecified  seasonality  Moderate persistent asthma without complication  Gastroesophageal reflux disease, unspecified whether esophagitis present  Hypothyroidism, unspecified type -     TSH -     T4, free  Pruritic condition  Family history of osteoporosis  History of Epstein-Barr virus infection  History of stress fracture  Hx of colonic polyp  Celiac disease  History of hip fracture  History of colitis  Need for influenza vaccination  Need for Tdap vaccination  Screening for lipid disorders -     LDL Cholesterol, Direct  Hives  Other orders -     Flu Vaccine QUAD 6+ mos PF IM (Fluarix Quad PF) -     Tdap vaccine  greater than or equal to 7yo IM -     Erenumab-aooe (AIMOVIG) 70 MG/ML SOAJ; INJECT INTO THE SKIN ONCE EVERY 30 DAYS       Follow-up pending labs, yearly for physical

## 2020-09-16 LAB — CBC WITH DIFFERENTIAL/PLATELET
Basophils Absolute: 0.1 10*3/uL (ref 0.0–0.2)
Basos: 1 %
EOS (ABSOLUTE): 0.5 10*3/uL — ABNORMAL HIGH (ref 0.0–0.4)
Eos: 7 %
Hematocrit: 45.9 % (ref 34.0–46.6)
Hemoglobin: 15.8 g/dL (ref 11.1–15.9)
Immature Grans (Abs): 0 10*3/uL (ref 0.0–0.1)
Immature Granulocytes: 1 %
Lymphocytes Absolute: 1.5 10*3/uL (ref 0.7–3.1)
Lymphs: 23 %
MCH: 33.1 pg — ABNORMAL HIGH (ref 26.6–33.0)
MCHC: 34.4 g/dL (ref 31.5–35.7)
MCV: 96 fL (ref 79–97)
Monocytes Absolute: 0.5 10*3/uL (ref 0.1–0.9)
Monocytes: 7 %
Neutrophils Absolute: 4.1 10*3/uL (ref 1.4–7.0)
Neutrophils: 61 %
Platelets: 247 10*3/uL (ref 150–450)
RBC: 4.78 x10E6/uL (ref 3.77–5.28)
RDW: 11.8 % (ref 11.7–15.4)
WBC: 6.6 10*3/uL (ref 3.4–10.8)

## 2020-09-16 LAB — IRON: Iron: 145 ug/dL (ref 27–159)

## 2020-09-16 LAB — COMPREHENSIVE METABOLIC PANEL
ALT: 12 IU/L (ref 0–32)
AST: 25 IU/L (ref 0–40)
Albumin/Globulin Ratio: 1.7 (ref 1.2–2.2)
Albumin: 4.5 g/dL (ref 3.8–4.8)
Alkaline Phosphatase: 44 IU/L (ref 44–121)
BUN/Creatinine Ratio: 11 (ref 9–23)
BUN: 11 mg/dL (ref 6–24)
Bilirubin Total: 0.4 mg/dL (ref 0.0–1.2)
CO2: 20 mmol/L (ref 20–29)
Calcium: 9.8 mg/dL (ref 8.7–10.2)
Chloride: 101 mmol/L (ref 96–106)
Creatinine, Ser: 1.01 mg/dL — ABNORMAL HIGH (ref 0.57–1.00)
GFR calc Af Amer: 77 mL/min/{1.73_m2} (ref >59)
GFR calc non Af Amer: 67 mL/min/{1.73_m2} (ref >59)
Globulin, Total: 2.7 g/dL (ref 1.5–4.5)
Glucose: 80 mg/dL (ref 65–99)
Potassium: 4.3 mmol/L (ref 3.5–5.2)
Sodium: 139 mmol/L (ref 134–144)
Total Protein: 7.2 g/dL (ref 6.0–8.5)

## 2020-09-16 LAB — T4, FREE: Free T4: 1.24 ng/dL (ref 0.82–1.77)

## 2020-09-16 LAB — LDL CHOLESTEROL, DIRECT: LDL Direct: 136 mg/dL — ABNORMAL HIGH (ref 0–99)

## 2020-09-16 LAB — TSH: TSH: 1.71 u[IU]/mL (ref 0.450–4.500)

## 2020-09-16 LAB — VITAMIN D 25 HYDROXY (VIT D DEFICIENCY, FRACTURES): Vit D, 25-Hydroxy: 43.1 ng/mL (ref 30.0–100.0)

## 2020-09-16 LAB — FERRITIN: Ferritin: 24 ng/mL (ref 15–150)

## 2020-09-21 ENCOUNTER — Other Ambulatory Visit: Payer: Self-pay | Admitting: Medical

## 2020-09-21 MED ORDER — AZITHROMYCIN 250 MG PO TABS: ORAL_TABLET | ORAL | 0 refills | Status: DC

## 2020-09-23 ENCOUNTER — Other Ambulatory Visit: Payer: Self-pay | Admitting: Medical

## 2020-09-23 MED ORDER — DOXYCYCLINE MONOHYDRATE 100 MG PO TABS: 100.0000 mg | ORAL_TABLET | Freq: Two times a day (BID) | ORAL | 0 refills | Status: DC

## 2020-09-27 ENCOUNTER — Telehealth: Payer: Self-pay

## 2020-09-27 NOTE — Telephone Encounter (Signed)
P.A. ONDANSETRON

## 2020-10-01 ENCOUNTER — Telehealth: Payer: Self-pay | Admitting: Medical

## 2020-10-01 NOTE — Telephone Encounter (Signed)
Requested records received from [facility]

## 2020-10-02 NOTE — Telephone Encounter (Signed)
P.A. denied for additional quantities unless pt is receiving radiation therapy or chemotherapy.  Will call plan .

## 2020-10-07 ENCOUNTER — Encounter: Payer: Self-pay | Admitting: Medical

## 2020-10-08 ENCOUNTER — Encounter: Payer: Self-pay | Admitting: Medical

## 2020-10-09 ENCOUNTER — Other Ambulatory Visit: Payer: Self-pay | Admitting: Medical

## 2020-10-09 ENCOUNTER — Encounter: Payer: Self-pay | Admitting: Medical

## 2020-10-09 NOTE — Telephone Encounter (Signed)
[facility]riptan . Last appointment  09/15/20 for cpe. Please advise [facility]

## 2020-10-29 ENCOUNTER — Telehealth (INDEPENDENT_AMBULATORY_CARE_PROVIDER_SITE_OTHER): Payer: 59 | Admitting: Medical

## 2020-10-29 VITALS — Wt 155.0 lb

## 2020-10-29 DIAGNOSIS — J0110 Acute frontal sinusitis, unspecified: Secondary | ICD-10-CM

## 2020-10-29 MED ORDER — AMOXICILLIN-POT CLAVULANATE 875-125 MG PO TABS: 1.0000 | ORAL_TABLET | Freq: Two times a day (BID) | ORAL | 0 refills | Status: DC

## 2020-10-29 NOTE — Progress Notes (Signed)
Subjective:     Patient ID: Barbara Odonnell, female   DOB: 06-01-1976, 44 y.o.   MRN: [id number]  This visit type was conducted due to national recommendations for restrictions regarding the COVID-19 Pandemic (e.g. social distancing) in an effort to limit this patient's exposure and mitigate transmission in our community.  Due to their co-morbid illnesses, this patient is at least at moderate risk for complications without adequate follow up.  This format is felt to be most appropriate for this patient at this time.    Documentation for virtual audio and video telecommunications through [facility] encounter:  The patient was located at home. The provider was located in the office. The patient did consent to this visit and is aware of possible charges through their insurance for this visit.  The other persons participating in this telemedicine service were none. Time spent on call was 20 minutes and in review of previous records 20 minutes total.  This virtual service is not related to other E/M service within previous 7 days.   HPI Chief Complaint  Patient presents with  . sick    Sinus congestion, sinus pressure, fatigue, achy, chills, symtpoms started Sunday, symptoms since last Thursday with fatigue. 2 negative covid tests  . Sinusitis   Virtual consult for respiratory symptoms.  She reports having some sinus issues x about a week including head pressure on left specificallly, congestion, headaches.   Had a PCR covid Monday that was negative and rapid test today negative.  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    Felt worse earlier in the week but started having energy coming back later in the week.    No fever, no change in taste or smell.   Has felt some clammy and some sweats.  Started with a headache last week off an on.  No diarrhea, but has had some nausea.  No vomiting.    No specific sick contacts.    Using water.   Using elderberry. Using nasal saline.  She has had the Covid vaccine as well as the booster on June 21, 2020 as well as recent flu shot.  No other aggravating or relieving factors. No other complaint.  Past Medical History:  Diagnosis Date  . Allergy   . Anemia    IV iron in past, prior to 2018  . Asthma   . Celiac disease    +prior biopsy  . GERD (gastroesophageal reflux disease)   . H/O bone density study 12/21/2017   normal  . Hip fracture ([place]) 2008   right, Dr. Oneida Alar  . History of stress test [id number]  . Hypothyroidism   . Migraine headache without aura    gets bad nausea.  does well on Naratriptan and Relpax.  Failed Imitrex, Maxalt  . Mood change    Wellbutrin, has used Zoloft prior  . Stress fracture    severeral prior  . Transfusion history    has had IV iron prior, Dr. Marin Olp prior management  . Wears contact lenses       Review of Systems As in subjective    Objective:   Physical Exam Due to coronavirus pandemic stay at home measures, patient visit was virtual and they were not examined in person.   Wt 155 lb (70.3 kg)   BMI 24.28 kg/m   General well-developed well-nourished no acute distress No obvious dyspnea or wheezing Answers questions in complete sentences  Assessment:     Encounter Diagnosis  Name Primary?  . Acute non-recurrent frontal sinusitis Yes       Plan:     We discussed her symptoms and concerns.  She has had 2 negative tests this week for Covid.  She has had no sick contacts.  She will continue supportive measures with good water intake, nasal saline flush, and if not improving she will begin Augmentin antibiotic for sinusitis.  If worse or not improving over the next several days then call back  Lumen was seen today for sick and sinusitis.  Diagnoses and all orders for this visit:  Acute non-recurrent frontal sinusitis  Other orders -     amoxicillin-clavulanate (AUGMENTIN) 875-125 MG tablet; Take 1  tablet by mouth 2 (two) times daily.  f/u prn

## 2020-11-03 ENCOUNTER — Telehealth: Payer: Self-pay

## 2020-11-03 NOTE — Telephone Encounter (Signed)
Called pharmacy had them do test claim for ondansetron & went thru with new [employer] ins

## 2020-11-04 ENCOUNTER — Other Ambulatory Visit: Payer: Self-pay | Admitting: Medical

## 2020-11-06 ENCOUNTER — Other Ambulatory Visit: Payer: Self-pay | Admitting: Medical

## 2020-11-07 ENCOUNTER — Telehealth: Payer: Self-pay

## 2020-11-07 NOTE — Telephone Encounter (Signed)
P.A. AIMOVIG

## 2020-11-10 NOTE — Telephone Encounter (Signed)
P.A. approved with new insurance [employer], left message for pt

## 2020-12-04 ENCOUNTER — Other Ambulatory Visit: Payer: Self-pay | Admitting: Medical

## 2020-12-11 ENCOUNTER — Telehealth: Payer: Self-pay

## 2020-12-11 NOTE — Telephone Encounter (Signed)
Barbara Odonnell called and states she has new insurance with [employer] and Aimovig needs P.A.  I started P.A. need additional info from Pt.  Called and left message and sent Mychart message

## 2020-12-11 NOTE — Telephone Encounter (Signed)
P.A. AIMOVIG

## 2020-12-12 NOTE — Telephone Encounter (Signed)
P.A. approved til 06/10/2021, sent pt mychart message

## 2020-12-18 ENCOUNTER — Other Ambulatory Visit: Payer: Self-pay

## 2020-12-18 ENCOUNTER — Encounter: Payer: Self-pay | Admitting: Family Medicine

## 2020-12-18 ENCOUNTER — Ambulatory Visit: Payer: 59 | Admitting: Family Medicine

## 2020-12-18 VITALS — BP 120/82 | HR 76 | Wt 156.8 lb

## 2020-12-18 DIAGNOSIS — L659 Nonscarring hair loss, unspecified: Secondary | ICD-10-CM

## 2020-12-18 DIAGNOSIS — D509 Iron deficiency anemia, unspecified: Secondary | ICD-10-CM | POA: Diagnosis not present

## 2020-12-18 DIAGNOSIS — E039 Hypothyroidism, unspecified: Secondary | ICD-10-CM | POA: Diagnosis not present

## 2020-12-18 NOTE — Progress Notes (Signed)
Subjective:    Patient ID: Barbara Odonnell, female    DOB: 12/05/1975, 45 y.o.   MRN: [id number]  HPI Chief Complaint  Patient presents with  . Alopecia    Hair loss- noticed it the last month. Shredding a lot more than normal   She is a 45 year old female with a history of hypothyroidism, migraine headaches, and iron deficiency anemia who is here with complaints of a one month history of thinning hair.  Her PCP is Dorothea Ogle, [place] and she reports having an appointment with him on Monday.  States she wanted to get labs done today and look for an explanation for hair loss.  States she feels like she has lost "half of her hair".  Reports noticing an increased amount in her hair brush and when she washes her hair.  No increased hair on her pillow.  Denies any patches of hair loss or breakage.  No pruritus of her scalp.  States she feels that her hair line is receding more lately.  Denies any other hair loss or changes in hair growth throughout her body.  Denies any recent illness.  Denies fever, chills, chest pain, palpitations, shortness of breath, abdominal pain, changes in bowel habits or menstrual cycles.   States she and her PCP have been trying to figure out why she has had a rash on her legs.  No other skin concerns today.  No nail concerns.  History of receiving IV iron due to severe anemia.   She is on Synthroid 50 mcg for hypothyroidism.  Started on Alpine over a year ago for migraines.  States she is not currently taking a multivitamin.   Review of Systems Pertinent positives and negatives in the history of present illness.     Objective:   Physical Exam Constitutional:      Appearance: Normal appearance. She is not ill-appearing.  HENT:     Head: Normocephalic and atraumatic.     Comments: No obvious abnormalities of her scalp observed. No patches of hair loss.  Eyes:     Conjunctiva/sclera: Conjunctivae normal.     Pupils: Pupils are equal, round, and reactive to  light.  Cardiovascular:     Rate and Rhythm: Normal rate and regular rhythm.     Pulses: Normal pulses.  Pulmonary:     Effort: Pulmonary effort is normal.     Breath sounds: Normal breath sounds.  Musculoskeletal:     Cervical back: Normal range of motion and neck supple.  Lymphadenopathy:     Cervical: No cervical adenopathy.  Skin:    General: Skin is warm and dry.     Coloration: Skin is not pale.     Findings: No rash.  Neurological:     General: No focal deficit present.     Mental Status: She is alert and oriented to person, place, and time.  Psychiatric:        Mood and Affect: Mood normal.        Thought Content: Thought content normal.    BP 120/82   Pulse 76   Wt 156 lb 12.8 oz (71.1 kg)   BMI 24.56 kg/m       Assessment & Plan:  Hair thinning - Plan: CBC with Differential/Platelet, Comprehensive metabolic panel, Iron, TIBC and Ferritin Panel, TSH, T4, free  Iron deficiency anemia, unspecified iron deficiency anemia type - Plan: CBC with Differential/Platelet, Iron, TIBC and Ferritin Panel  Hypothyroidism, unspecified type - Plan: TSH, T4, free  Discussed potential  etiologies for thinning hair including anemia, thyroid disease or other potential autoimmune issues.  Her exam is unremarkable today.  Discussed starting on a women's One-A-Day multivitamin.  I will check labs and she will follow-up as scheduled with her PCP next week.

## 2020-12-19 LAB — CBC WITH DIFFERENTIAL/PLATELET
Basophils Absolute: 0.1 10*3/uL (ref 0.0–0.2)
Basos: 2 %
EOS (ABSOLUTE): 0.3 10*3/uL (ref 0.0–0.4)
Eos: 7 %
Hematocrit: 44.2 % (ref 34.0–46.6)
Hemoglobin: 15.3 g/dL (ref 11.1–15.9)
Immature Grans (Abs): 0 10*3/uL (ref 0.0–0.1)
Immature Granulocytes: 0 %
Lymphocytes Absolute: 1.7 10*3/uL (ref 0.7–3.1)
Lymphs: 36 %
MCH: 33 pg (ref 26.6–33.0)
MCHC: 34.6 g/dL (ref 31.5–35.7)
MCV: 95 fL (ref 79–97)
Monocytes Absolute: 0.5 10*3/uL (ref 0.1–0.9)
Monocytes: 11 %
Neutrophils Absolute: 2.1 10*3/uL (ref 1.4–7.0)
Neutrophils: 44 %
Platelets: 222 10*3/uL (ref 150–450)
RBC: 4.64 x10E6/uL (ref 3.77–5.28)
RDW: 11.9 % (ref 11.7–15.4)
WBC: 4.7 10*3/uL (ref 3.4–10.8)

## 2020-12-19 LAB — COMPREHENSIVE METABOLIC PANEL
ALT: 17 IU/L (ref 0–32)
AST: 24 IU/L (ref 0–40)
Albumin/Globulin Ratio: 1.8 (ref 1.2–2.2)
Albumin: 4.4 g/dL (ref 3.8–4.8)
Alkaline Phosphatase: 51 IU/L (ref 44–121)
BUN/Creatinine Ratio: 8 — ABNORMAL LOW (ref 9–23)
BUN: 7 mg/dL (ref 6–24)
Bilirubin Total: 0.3 mg/dL (ref 0.0–1.2)
CO2: 24 mmol/L (ref 20–29)
Calcium: 9.5 mg/dL (ref 8.7–10.2)
Chloride: 99 mmol/L (ref 96–106)
Creatinine, Ser: 0.92 mg/dL (ref 0.57–1.00)
GFR calc Af Amer: 86 mL/min/{1.73_m2} (ref >59)
GFR calc non Af Amer: 74 mL/min/{1.73_m2} (ref >59)
Globulin, Total: 2.4 g/dL (ref 1.5–4.5)
Glucose: 85 mg/dL (ref 65–99)
Potassium: 4.5 mmol/L (ref 3.5–5.2)
Sodium: 137 mmol/L (ref 134–144)
Total Protein: 6.8 g/dL (ref 6.0–8.5)

## 2020-12-19 LAB — IRON,TIBC AND FERRITIN PANEL
Ferritin: 33 ng/mL (ref 15–150)
Iron Saturation: 27 % (ref 15–55)
Iron: 84 ug/dL (ref 27–159)
Total Iron Binding Capacity: 309 ug/dL (ref 250–450)
UIBC: 225 ug/dL (ref 131–425)

## 2020-12-19 LAB — T4, FREE: Free T4: 1.38 ng/dL (ref 0.82–1.77)

## 2020-12-19 LAB — TSH: TSH: 1.41 u[IU]/mL (ref 0.450–4.500)

## 2020-12-20 ENCOUNTER — Encounter: Payer: Self-pay | Admitting: Family Medicine

## 2020-12-20 NOTE — Progress Notes (Signed)
I saw her for an acute issue Friday and she is coming in to see you tomorrow. Labs done [facility]

## 2020-12-21 ENCOUNTER — Other Ambulatory Visit: Payer: Self-pay

## 2020-12-21 ENCOUNTER — Telehealth: Payer: Self-pay | Admitting: Medical

## 2020-12-21 ENCOUNTER — Encounter: Payer: Self-pay | Admitting: Medical

## 2020-12-21 ENCOUNTER — Ambulatory Visit: Payer: 59 | Admitting: Medical

## 2020-12-21 VITALS — BP 120/78 | HR 68 | Ht 67.0 in | Wt 158.4 lb

## 2020-12-21 DIAGNOSIS — L659 Nonscarring hair loss, unspecified: Secondary | ICD-10-CM | POA: Diagnosis not present

## 2020-12-21 DIAGNOSIS — Z711 Person with feared health complaint in whom no diagnosis is made: Secondary | ICD-10-CM

## 2020-12-21 DIAGNOSIS — E039 Hypothyroidism, unspecified: Secondary | ICD-10-CM

## 2020-12-21 DIAGNOSIS — E559 Vitamin D deficiency, unspecified: Secondary | ICD-10-CM | POA: Diagnosis not present

## 2020-12-21 DIAGNOSIS — G43709 Chronic migraine without aura, not intractable, without status migrainosus: Secondary | ICD-10-CM

## 2020-12-21 DIAGNOSIS — G43829 Menstrual migraine, not intractable, without status migrainosus: Secondary | ICD-10-CM | POA: Diagnosis not present

## 2020-12-21 DIAGNOSIS — D509 Iron deficiency anemia, unspecified: Secondary | ICD-10-CM

## 2020-12-21 DIAGNOSIS — K900 Celiac disease: Secondary | ICD-10-CM

## 2020-12-21 NOTE — Telephone Encounter (Signed)
All referrals sent.

## 2020-12-21 NOTE — Addendum Note (Signed)
Addended by: Edgar Frisk on: 12/21/2020 09:41 AM   Modules accepted: Orders

## 2020-12-21 NOTE — Telephone Encounter (Signed)
Refer to Dr. Aloha Gell gynecology  Refer to [facility] either Dr. Amy Martinique or Dr. Wilhemina Bonito if appt within 1-2 months.  If not, refer to [facility]

## 2020-12-21 NOTE — Progress Notes (Signed)
Subjective:    Patient ID: Barbara Odonnell, female    DOB: 1976-08-12, 45 y.o.   MRN: [id number]  HPI Chief Complaint  Patient presents with  . Follow-up    Hair loss-had blood work on Friday    Here for f/u on hair loss.  Saw Vickie here last Friday and had labs.  For the last few months has general hair thinning, notices on her hair brush and sometime in the sink or tub.   No patches of hair loss, just generalized.  Has a few hunches why she may be having hair loss.  Thinks Amiovig may be causing things or stress.   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  Also concerned about hormonal causes.     She thought about stopping Amiovig temporarily.  Other prior migraine preventative medication includes Topamax which did horrible for her, but she doesn't think she was on Nortriypline and no prior beta blockers.   She wants referral to new gynecologist, her old one Dr. Leo Grosser retired.   Wants to see Dr. Pamala Hurry.   Also wants referral to dermatology, either Dr. Amy Martinique or Dr. Wilhemina Bonito at [facility].   Prior saw Dr. Ronnald Ramp in [facility].  Review of Systems Pertinent positives and negatives in the history of present illness.   Objective: BP 120/78   Pulse 68   Ht 5' 7"  (1.702 m)   Wt 158 lb 6.4 oz (71.8 kg)   SpO2 99%   BMI 24.81 kg/m   General appearance: alert, no distress, well developed, well nourished General hair thinning of scalp, no distinct patches of hair loss, no hair loss around frontal hair line.   No other hair loss on arms, and she notes no hair loss in pubic region   Assessment: Encounter Diagnoses  Name Primary?  . Hair loss Yes  . Menstrual migraine without status migrainosus, not intractable   . Concern about skin disease without diagnosis   . Vitamin D deficiency   . Hypothyroidism, unspecified type   . Iron deficiency anemia, unspecified iron deficiency anemia type   . Celiac disease   . Chronic migraine without aura  without status migrainosus, not intractable      Plan: Hair loss-we discussed possible causes.  Likely stress related although could be related to medications including Synthroid and Aimovig or underlying autoimmune issue but I would expect more hair loss.  She has been on Synthroid for a long time so probably not related to Synthroid.  There is the possibility of this being related to [place] since this is a newer medicine for her.  She may consider stopping Aimovig temporarily.  If so she will use ibuprofen 600 mg for few days leading up to her periods since she does have a feature of menstrual migraine.  She did horrible on Topamax in the past.  She thinks she may have been on amitriptyline at one time.  She has never been on a beta-blocker.  Given her avid exercise beta-blocker would not be a good choice.   Consider neurology consult going forward  Routine GYN care-referral to Dr. Larina Earthly  Skin concerns-referral to [facility] dermatology at her request  I reviewed her visit and labs for Friday last week  [name] was seen today for follow-up.  Diagnoses and all orders for this visit:  Hair loss -     Follicle stimulating hormone -     Estrogens, total  Menstrual migraine without status migrainosus, not intractable -  Follicle stimulating hormone -     Estrogens, total  Concern about skin disease without diagnosis  Vitamin D deficiency  Hypothyroidism, unspecified type  Iron deficiency anemia, unspecified iron deficiency anemia type  Celiac disease  Chronic migraine without aura without status migrainosus, not intractable  f/u pending referrals

## 2020-12-22 ENCOUNTER — Ambulatory Visit: Payer: 59 | Admitting: Medical

## 2020-12-24 ENCOUNTER — Other Ambulatory Visit: Payer: Self-pay

## 2020-12-24 DIAGNOSIS — I7300 Raynaud's syndrome without gangrene: Secondary | ICD-10-CM

## 2020-12-24 DIAGNOSIS — K900 Celiac disease: Secondary | ICD-10-CM

## 2020-12-24 LAB — ESTROGENS, TOTAL: Estrogen: 95 pg/mL

## 2020-12-24 LAB — FOLLICLE STIMULATING HORMONE: FSH: 8 m[IU]/mL

## 2021-01-22 ENCOUNTER — Telehealth: Payer: 59 | Admitting: Medical

## 2021-01-22 ENCOUNTER — Other Ambulatory Visit: Payer: Self-pay

## 2021-01-22 VITALS — HR 56 | Temp 99.1°F | Ht 67.0 in | Wt 153.0 lb

## 2021-01-22 DIAGNOSIS — R0981 Nasal congestion: Secondary | ICD-10-CM | POA: Diagnosis not present

## 2021-01-22 DIAGNOSIS — J4540 Moderate persistent asthma, uncomplicated: Secondary | ICD-10-CM | POA: Diagnosis not present

## 2021-01-22 DIAGNOSIS — U071 COVID-19: Secondary | ICD-10-CM | POA: Diagnosis not present

## 2021-01-22 MED ORDER — AMOXICILLIN-POT CLAVULANATE 875-125 MG PO TABS: 1.0000 | ORAL_TABLET | Freq: Two times a day (BID) | ORAL | 0 refills | Status: DC

## 2021-01-22 MED ORDER — PAXLOVID 20 X 150 MG & 10 X 100MG PO TBPK: 1.0000 | ORAL_TABLET | Freq: Two times a day (BID) | ORAL | 0 refills | Status: DC

## 2021-01-22 NOTE — Progress Notes (Signed)
Subjective:     Patient ID: Barbara Odonnell, female   DOB: 01/18/1976, 45 y.o.   MRN: [id number]  This visit type was conducted due to national recommendations for restrictions regarding the COVID-19 Pandemic (e.g. social distancing) in an effort to limit this patient's exposure and mitigate transmission in our community.  Due to their co-morbid illnesses, this patient is at least at moderate risk for complications without adequate follow up.  This format is felt to be most appropriate for this patient at this time.    Documentation for virtual audio and video telecommunications through [facility] encounter:  The patient was located at home. The provider was located in the office. The patient did consent to this visit and is aware of possible charges through their insurance for this visit.  The other persons participating in this telemedicine service were none. Time spent on call was 20 minutes and in review of previous records 20 minutes total.  This virtual service is not related to other E/M service within previous 7 days.   HPI Chief Complaint  Patient presents with  . Covid Positive    Tested positive covid 01/19/21 and 01/22/21   Virtual consult for covid infection.  Tested + on 01/19/21.   Started about 5 days ago with symptoms.  Started with eye soreness.  Currently has severe sinus pressure in head, tons of sneezing, blowing nose, lots of congestion, aches and chills intermittent, feels some brain fog.  No loss of smell or taste.  Temp around 99.1.   Using pulse ox, temp and pulse readings, all have been normal.  98.6 or so temp.  Pulse up to 80s.  Oxygen been around 98%.  Monday 5 days ago more in head symptoms, now some in chest, cough.  Cough is productive of some phlegm.  Using benadryl 2-19m per night of kids benadryl, some xanax at night to help with sleep.  Benadryl sometimes keeps her awake.  Using some Advil /tylenol combo.  Taking Zinc gummies, airborne vit C, but hasn't been  taking vitamin D. Chest feels a little tight, but hasn't used albuterol yet with these symptoms.  Been doing some nasal rinse saline.  No other aggravating or relieving factors. No other complaint.  Past Medical History:  Diagnosis Date  . Allergy   . Anemia    IV iron in past, prior to 2018  . Asthma   . Celiac disease    +prior biopsy  . GERD (gastroesophageal reflux disease)   . H/O bone density study 12/21/2017   normal  . [place]   right, Dr. FOneida Alar . History of stress test [address] . Hypothyroidism   . Migraine headache without aura    gets bad nausea.  does well on Naratriptan and Relpax.  Failed Imitrex, Maxalt  . Mood change    Wellbutrin, has used Zoloft prior  . Stress fracture    severeral prior  . Transfusion history    has had IV iron prior, Dr. EMarin Olpprior management  . Wears contact lenses    Current Outpatient Medications on File Prior to Visit  Medication Sig Dispense Refill  . albuterol (PROAIR HFA) 108 (90 Base) MCG/ACT inhaler Inhale 2 puffs into the lungs every 6 (six) hours as needed for wheezing or shortness of breath. 54 g 0  . albuterol (PROVENTIL) (2.5 MG/3ML) 0.083% nebulizer solution Take 3 mLs (2.5 mg total) by nebulization every 6 (six) hours as needed for wheezing or  shortness of breath. 75 mL 2  . benzoyl peroxide-erythromycin (BENZAMYCIN) gel Apply topically 2 (two) times daily. 23.3 g 1  . budesonide-formoterol (SYMBICORT) 160-4.5 MCG/ACT inhaler Inhale 2 puffs into the lungs in the morning and at bedtime. 1 Inhaler 12  . buPROPion (WELLBUTRIN XL) 150 MG 24 hr tablet TAKE 1 TABLET BY MOUTH EVERY DAY 30 tablet 2  . [employer] D3 25 MCG (1000 UT) capsule TAKE 1 CAPSULE (1,000 UNITS TOTAL) BY MOUTH DAILY. 90 capsule 3  . Erenumab-aooe (AIMOVIG) 70 MG/ML SOAJ INJECT INTO THE SKIN ONCE EVERY 30 DAYS 1 mL 11  . etonogestrel-ethinyl estradiol (NUVARING) 0.12-0.015 MG/24HR vaginal ring Place 1 each vaginally every 28  (twenty-eight) days. Insert vaginally and leave in place for 3 consecutive weeks, then remove for 1 week.    . famotidine (PEPCID) 40 MG tablet TAKE 1 TABLET BY MOUTH EVERY DAY IN THE EVENING 30 tablet 2  . FeAspGl-FeFum-B12-FA-C-Succ Ac (FERREX 28) MISC TAKE 1 TABLET BY MOUTH EVERY DAY 90 each 0  . hydroquinone 4 % cream SMARTSIG:Sparingly Topical Daily PRN    . naratriptan (AMERGE) 2.5 MG tablet TAKE 1 TABLET AS NEEDED FOR MIGRAINE, MAY REPEAT AFTER 4 HOURS (MAX 5MG IN 24HRS) *MAX OF 18 IN 30* 18 tablet 3  . ondansetron (ZOFRAN) 4 MG tablet TAKE 1 TABLET BY MOUTH EVERY 8 HOURS AS NEEDED FOR NAUSEA AND VOMITING 20 tablet 2  . Peak Flow Meter DEVI 1 each by Does not apply route daily. 1 each 0  . Spacer/Aero-Holding Chambers (AEROCHAMBER MV) inhaler Use as instructed 1 each 0  . SYNTHROID 50 MCG tablet TAKE 1 TABLET BY MOUTH EVERY DAY(30 DAY INS LIMIT) 30 tablet 2  . tretinoin (RETIN-A) 0.05 % cream SMARTSIG:1 Sparingly Topical Daily    . triamcinolone (KENALOG) 0.1 % Apply topically.    . valACYclovir (VALTREX) 500 MG tablet TAKE 1 TABLET BY MOUTH EVERY DAY *MYLAN* 30 tablet 2   No current facility-administered medications on file prior to visit.    Review of Systems As in subjective    Objective:   Physical Exam Due to coronavirus pandemic stay at home measures, patient visit was virtual and they were not examined in person.   Pulse (!) 56   Temp 99.1 F (37.3 C)   Ht 5' 7"  (1.702 m)   Wt 153 lb (69.4 kg)   SpO2 98%   BMI 23.96 kg/m   Gen: wd, wn, nad Stopped up sounding No obvious dyspnea or wheezing      Assessment:     Encounter Diagnoses  Name Primary?  . COVID-19 virus infection Yes  . Nasal congestion   . Moderate persistent asthma without complication          Plan:     Discussed supportive care, rest, c/t vit C, zinc and add vit D supplement for acute illness, c/t good hydration.  C/t routine asthma preventative medications, and begin back on albuterol  if needed.  C/t o2 sat monitoring with plan to get re-evaluated if 91% or less.    Begin Paxlovid.  Discussed risks/benefits of medication, proper use of medication  If worse sinus pressure within 3 days with thick mucous can begin Augmentin.  If not c/t supportive measures.   Barbara Odonnell was seen today for covid positive.  Diagnoses and all orders for this visit:  COVID-19 virus infection  Nasal congestion  Moderate persistent asthma without complication  Other orders -     amoxicillin-clavulanate (AUGMENTIN) 875-125 MG tablet; Take 1  tablet by mouth 2 (two) times daily. -     Nirmatrelvir & Ritonavir (PAXLOVID) 20 x 150 MG & 10 x 100MG TBPK; Take 1 tablet by mouth in the morning and at bedtime.  f/u prn

## 2021-01-24 IMAGING — DX CHEST - 2 VIEW
2 series · 2 of 2 positions shown · non-contrast
Comparison: Chest x-ray dated February 26, 2019.

CLINICAL DATA: Shortness of breath.

EXAM:
CHEST - 2 VIEW

[chest pa]
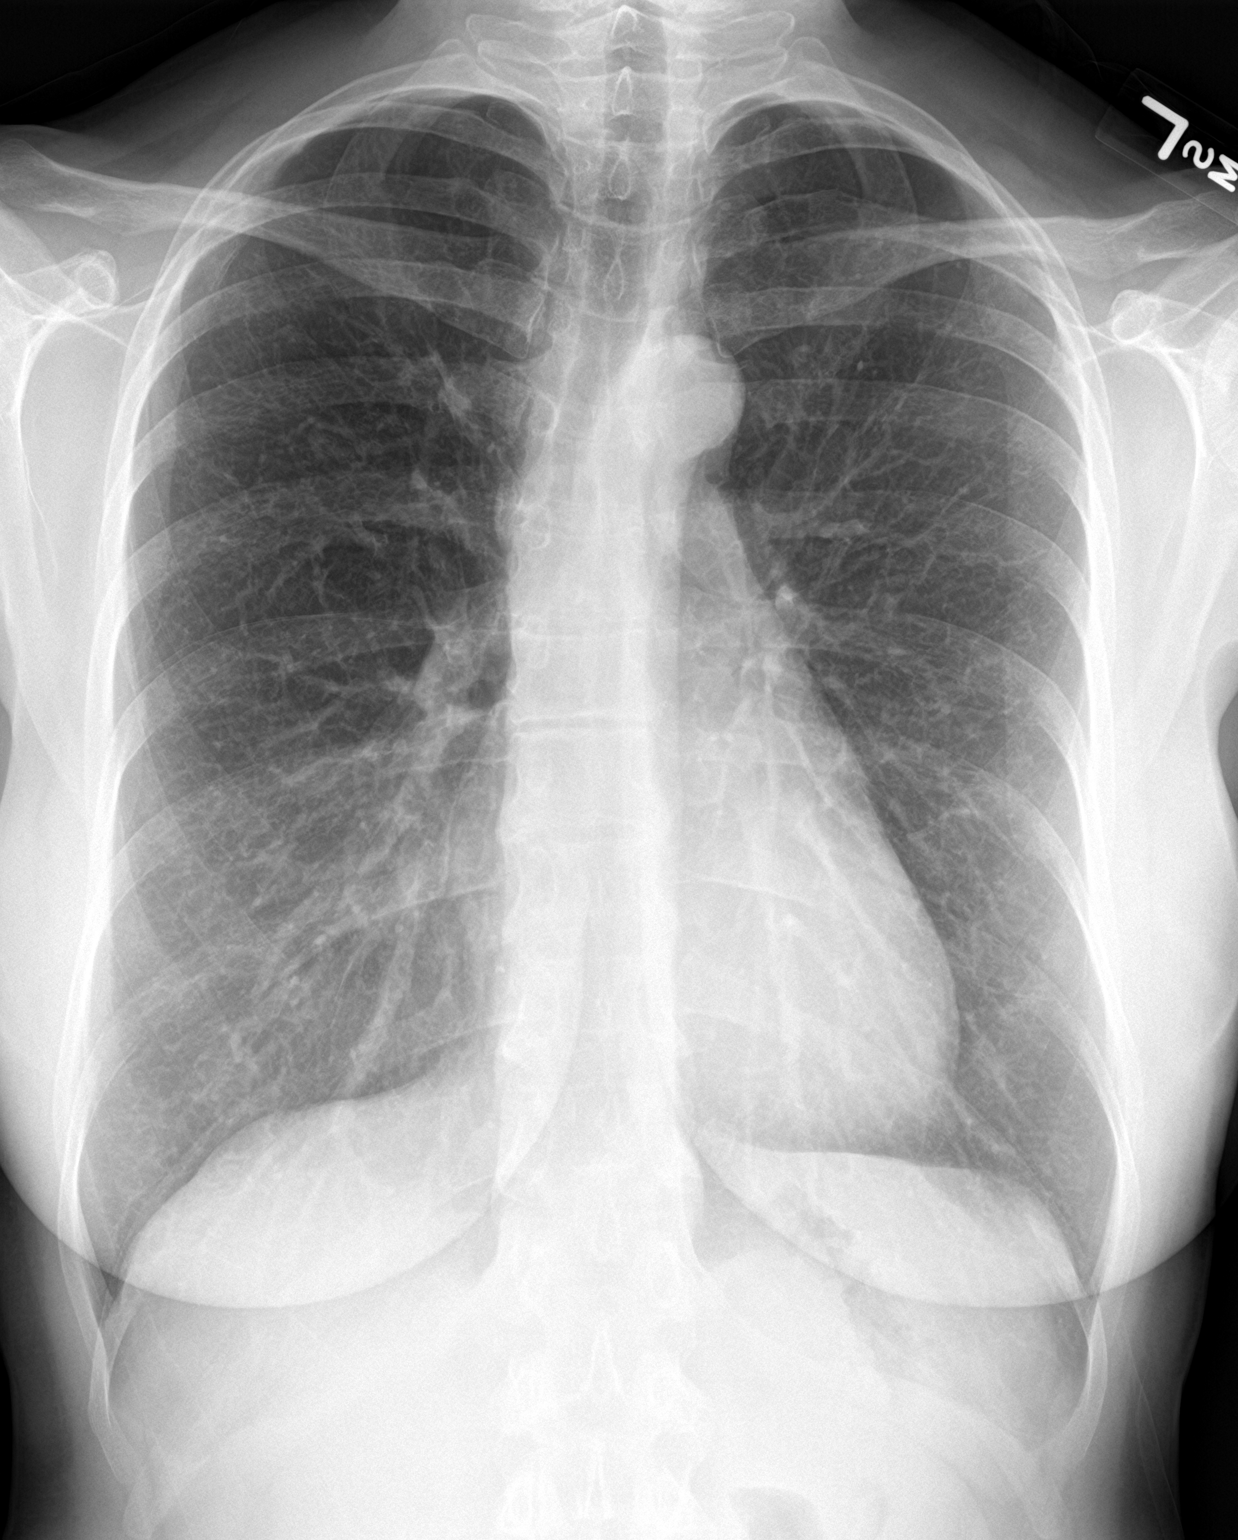

[chest lat]
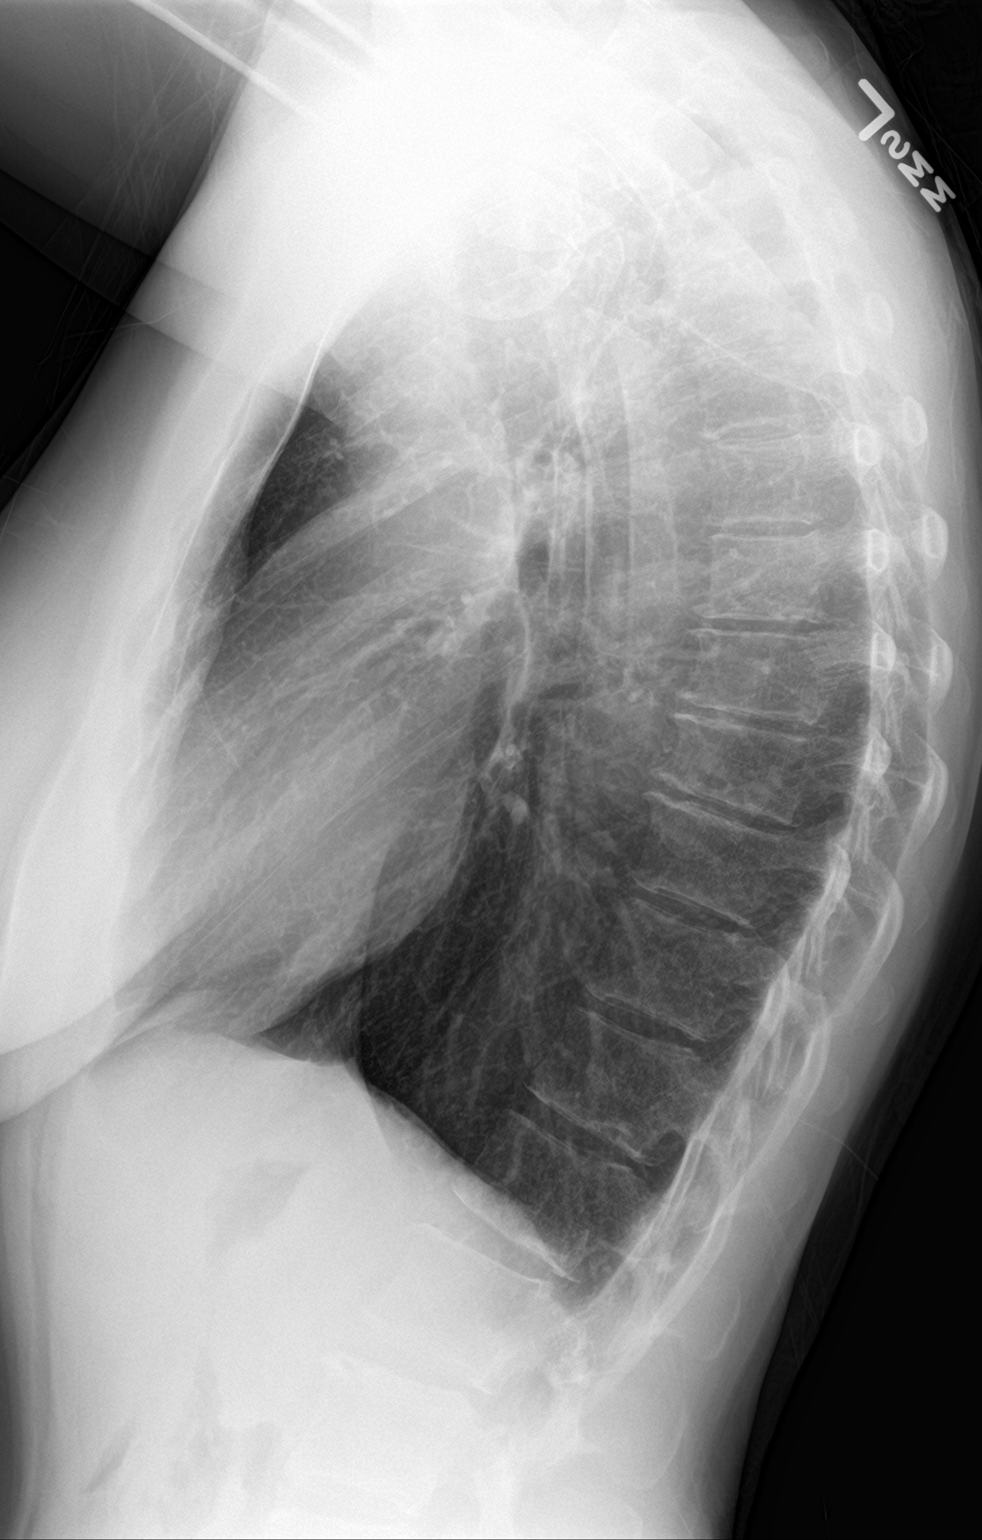

[2 of 2 positions shown; findings below may reference images not displayed]

FINDINGS: The heart size and mediastinal contours are within normal limits.
Normal pulmonary vascularity. No focal consolidation, pleural
effusion, or pneumothorax. No acute osseous abnormality.
IMPRESSION: No active cardiopulmonary disease.

## 2021-01-26 ENCOUNTER — Other Ambulatory Visit: Payer: Self-pay | Admitting: Medical

## 2021-02-01 ENCOUNTER — Other Ambulatory Visit: Payer: Self-pay | Admitting: Medical

## 2021-02-01 DIAGNOSIS — R059 Cough, unspecified: Secondary | ICD-10-CM

## 2021-02-01 DIAGNOSIS — R0602 Shortness of breath: Secondary | ICD-10-CM

## 2021-02-01 MED ORDER — PREDNISONE 10 MG PO TABS: ORAL_TABLET | ORAL | 0 refills | Status: DC

## 2021-02-02 ENCOUNTER — Other Ambulatory Visit: Payer: Self-pay | Admitting: Medical

## 2021-02-05 ENCOUNTER — Ambulatory Visit
Admission: RE | Admit: 2021-02-05 | Discharge: 2021-02-05 | Disposition: A | Discharge status: Home / Self Care | Payer: 59 | Source: Ambulatory Visit | Attending: Medical | Admitting: Medical

## 2021-02-05 ENCOUNTER — Other Ambulatory Visit: Payer: Self-pay

## 2021-02-05 DIAGNOSIS — R0602 Shortness of breath: Secondary | ICD-10-CM

## 2021-02-05 DIAGNOSIS — R059 Cough, unspecified: Secondary | ICD-10-CM

## 2021-02-19 ENCOUNTER — Encounter: Payer: Self-pay | Admitting: Family Medicine

## 2021-02-19 ENCOUNTER — Telehealth: Payer: 59 | Admitting: Family Medicine

## 2021-02-19 VITALS — HR 55 | Temp 98.6°F | Wt 156.0 lb

## 2021-02-19 DIAGNOSIS — M62838 Other muscle spasm: Secondary | ICD-10-CM | POA: Diagnosis not present

## 2021-02-19 MED ORDER — METHOCARBAMOL 500 MG PO TABS: 500.0000 mg | ORAL_TABLET | Freq: Three times a day (TID) | ORAL | 0 refills | Status: DC | PRN

## 2021-02-19 NOTE — Progress Notes (Signed)
   Subjective:  Documentation for virtual audio and video telecommunications through [facility] encounter:  The patient was located at home. 2 patient identifiers used.  The provider was located in the office. The patient did consent to this visit and is aware of possible charges through their insurance for this visit.  The other persons participating in this telemedicine service were none. Time spent on call was 14 minutes and in review of previous records 20 minutes total.  This virtual service is not related to other E/M service within previous 7 days.   Patient ID: Barbara Odonnell, female    DOB: [birth year], 45 y.o.   MRN: [id number]  HPI Chief Complaint  Patient presents with  . Neck Pain    Neck pain- went to ortho this morning xray, negativem pulled muscle, gave shot in muscle but did not give steriod agin due to having it recently already with covid, had old bottle of valium from 2007 that she has taken but going out of town tonight and needs a muscle relaxer   Complains of right-sided posterior neck pain after foam rolling at yoga and pilates class 4 days ago.  States her neck is tight and ROM of limited.  No numbness, tingling or weakness   States she went to [facility] this morning and had an XR which was fine and they did a muscle injection.  States she has not gotten any relief yet.  Taking ibuprofen, using ice and heat.   Review of Systems Pertinent positives and negatives in the history of present illness.     Objective:   Physical Exam Pulse (!) 55   Temp 98.6 F (37 C)   Wt 156 lb (70.8 kg)   SpO2 98%   BMI 24.43 kg/m   Alert and oriented in no acute distress.  Limited range of motion of her neck on video.  Speaking in complete senses without difficulty.  Normal speech, mood and thought process.      Assessment & Plan:  Neck muscle spasm - Plan: methocarbamol (ROBAXIN) 500 MG tablet No red flag symptoms.  She reports having a negative x-ray at  [facility] this morning as well as a steroid and lidocaine injection in the muscle but has not noticed any improvement.  I am unable to visualize this in the EMR. Robaxin prescribed and I discussed the sedating nature of this medication.  She will avoid driving or drinking alcohol with this medication.  Recommend NSAID, topical analgesic as well as heat or ice. Follow-up as needed

## 2021-02-26 ENCOUNTER — Other Ambulatory Visit: Payer: Self-pay | Admitting: Medical

## 2021-03-06 ENCOUNTER — Other Ambulatory Visit: Payer: Self-pay | Admitting: Medical

## 2021-03-12 ENCOUNTER — Telehealth: Payer: Self-pay | Admitting: Medical

## 2021-03-12 NOTE — Telephone Encounter (Signed)
error 

## 2021-03-16 ENCOUNTER — Ambulatory Visit: Payer: 59 | Admitting: Medical

## 2021-03-16 ENCOUNTER — Other Ambulatory Visit: Payer: Self-pay

## 2021-03-16 ENCOUNTER — Encounter: Payer: Self-pay | Admitting: Medical

## 2021-03-16 VITALS — BP 118/84 | HR 78 | Temp 98.1°F | Ht 67.0 in | Wt 151.4 lb

## 2021-03-16 DIAGNOSIS — E039 Hypothyroidism, unspecified: Secondary | ICD-10-CM | POA: Diagnosis not present

## 2021-03-16 DIAGNOSIS — D509 Iron deficiency anemia, unspecified: Secondary | ICD-10-CM | POA: Diagnosis not present

## 2021-03-16 DIAGNOSIS — J301 Allergic rhinitis due to pollen: Secondary | ICD-10-CM | POA: Diagnosis not present

## 2021-03-16 DIAGNOSIS — J4540 Moderate persistent asthma, uncomplicated: Secondary | ICD-10-CM | POA: Diagnosis not present

## 2021-03-16 DIAGNOSIS — R002 Palpitations: Secondary | ICD-10-CM | POA: Diagnosis not present

## 2021-03-16 DIAGNOSIS — L659 Nonscarring hair loss, unspecified: Secondary | ICD-10-CM | POA: Insufficient documentation

## 2021-03-16 DIAGNOSIS — R110 Nausea: Secondary | ICD-10-CM

## 2021-03-16 DIAGNOSIS — Z8616 Personal history of COVID-19: Secondary | ICD-10-CM

## 2021-03-16 DIAGNOSIS — G43709 Chronic migraine without aura, not intractable, without status migrainosus: Secondary | ICD-10-CM

## 2021-03-16 DIAGNOSIS — R251 Tremor, unspecified: Secondary | ICD-10-CM

## 2021-03-16 MED ORDER — ONDANSETRON 4 MG PO TBDP: 4.0000 mg | ORAL_TABLET | Freq: Three times a day (TID) | ORAL | 3 refills | Status: DC | PRN

## 2021-03-16 NOTE — Progress Notes (Addendum)
Subjective:    Patient ID: Barbara Odonnell, female    DOB: Apr 17, 1976, 45 y.o.   MRN: [id number]  HPI Chief Complaint  Patient presents with  . Medication Management   Here for med check.  She dealt with Covid infection in March 2022.  Feels like her breathing is much better.  Still using Symbicort, but current 2 puffs just once daily.  Not having to use the rescue inhaler much in recent weeks.  Used it last week one day.    Since having covid in March, her persistent symptoms include palpitations , fatigue, shakes, and nausea.  Sometimes feels shaky in the morning, getting some palpitations and skipped beats intermittent throughout the day in recent weeks.  No heavy caffeine use.  Getting decent sleep typically about 7 hours per night.  Awakes rested.  Denies other numbness, tingling, weakness or shakes.  Mostly in the hands.   Lately having nausea intermittent since covid.  Still has fatigue since covid.  Sense of smell and taste are ok.   Using Zofran occasionally.  Still able to do her exercise routines, spin class though.    Water intake is good.  Denies abdominal pain, back pain.    Saw dermatology and they feel her hair loss from last visit is stress related.    Typically she has gotten echocardiogram every 2 years.  She is due for this she feels.  She is having some postnasal drainage and congestion.  However she gets paradoxical effects with Benadryl as it keeps her awake.  She is doing nasal saline currently.  Review of Systems Pertinent positives and negatives in the history of present illness.   Objective: BP 118/84   Pulse 78   Temp 98.1 F (36.7 C)   Ht 5' 7"  (1.702 m)   Wt 151 lb 6.4 oz (68.7 kg)   SpO2 97%   BMI 23.71 kg/m    General appearence: alert, no distress, WD/WN, white female HEENT: normocephalic, sclerae anicteric, PERRLA, EOMi, nares with some mucoid discharge, turbinate swelling on the left, slight erythema, pharynx normal Oral cavity: MMM, no  lesions Neck: supple, no lymphadenopathy, no thyromegaly, no masses, no bruits Heart: RRR, normal S1, S2, no murmurs Lungs: CTA bilaterally, no wheezes, rhonchi, or rales Abdomen: +bs, soft, non tender, non distended, no masses, no hepatomegaly, no splenomegaly Musculoskeletal: no swelling, no obvious deformity Extremities: no edema, no cyanosis, no clubbing Pulses: 2+ symmetric, upper and lower extremities, normal cap refill Neurological: alert, oriented x 3, CN2-12 intact, strength normal upper extremities and lower extremities, sensation normal throughout, DTRs 2+ throughout, no cerebellar signs, gait normal Psychiatric: normal affect, behavior normal, pleasant   EKG indication palpitations, rate 64 bpm, PR 132 ms, QRS 74 ms, QTC 445 ms, axis 70 degrees, normal sinus rhythm, possible right atrial enlargement per EKG   Assessment: Encounter Diagnoses  Name Primary?  . Hypothyroidism, unspecified type Yes  . Iron deficiency anemia, unspecified iron deficiency anemia type   . Moderate persistent asthma without complication   . Allergic rhinitis due to pollen, unspecified seasonality   . Chronic migraine without aura without status migrainosus, not intractable   . History of COVID-19   . Palpitation   . Nausea   . Hair loss   . Shakes      Plan: Palpitations-EKG reviewed.  We will refer to cardiology for further eval with event monitor as well as updated echocardiogram.  She request updated echocardiogram.  Her last echocardiogram in 2019 was normal  History of COVID, shakes, nausea, palpitations-we discussed potential reasons why she continues to get some of the symptoms.  We discussed working on good sleep hygiene in case sleep is causing some issues with the shakes and palpitations.  She is not drinking excessive caffeine, she is getting a good variety of nutrients and electrolytes in the diet.  She declines labs today.  We did review labs from February 2022  hypothyroidism -  labs reviewed from 12/2020, normal for TSH and Free T4  Anemia - most recent labs including iron, ferritin, CBC normal 12/2020.    Asthma - PFT reviewed.  Continue Symbicort for prevention, albuterol as needed  Allergies, postnasal drainage, head congestion- advised 5 days of Mucinex over-the-counter to see if this helps with mucus as well as the nausea since COVID.  She does not do well with antihistamines as they give her insomnia  Migraines -no recent concerns, doing okay on current medication  Hair loss- she saw dermatology recently and they felt her hair loss was stress related.  No specific treatment recommended other than over-the-counter topical  I recommended the Pneumococcal vaccine.  She notes having Prevnar 13 and pneumococcal 23 both in [place] in the past.   Next visit consider pneumococcal 23 update as her last was 2015, updated iron blood test   Barbara Odonnell was seen today for medication management.  Diagnoses and all orders for this visit:  Hypothyroidism, unspecified type -     Ambulatory referral to Cardiology  Iron deficiency anemia, unspecified iron deficiency anemia type  Moderate persistent asthma without complication -     Spirometry with Graph  Allergic rhinitis due to pollen, unspecified seasonality  Chronic migraine without aura without status migrainosus, not intractable  History of COVID-19  Palpitation -     Ambulatory referral to Cardiology -     EKG 12-Lead  Nausea  Hair loss  Shakes  Other orders -     ondansetron (ZOFRAN-ODT) 4 MG disintegrating tablet; Take 1 tablet (4 mg total) by mouth every 8 (eight) hours as needed for nausea or vomiting.  f/u pending cardiology referral

## 2021-03-16 NOTE — Addendum Note (Signed)
Addended by: Carlena Hurl on: 03/16/2021 11:50 AM   Modules accepted: Orders

## 2021-03-29 ENCOUNTER — Other Ambulatory Visit: Payer: Self-pay | Admitting: Medical

## 2021-03-31 ENCOUNTER — Other Ambulatory Visit: Payer: Self-pay | Admitting: Medical

## 2021-04-28 ENCOUNTER — Telehealth: Payer: Self-pay

## 2021-04-28 NOTE — Telephone Encounter (Signed)
Aimovig approved with new insurance but it is still very expensive with the discount card copay is $455.  High deductible [phone],  you will have to pay full price for your medications until your deductible to met.  Can patient have sample until she discusses options with Audelia Acton or applies for Pt Assistance ?

## 2021-05-11 ENCOUNTER — Ambulatory Visit: Payer: 59 | Admitting: Medical

## 2021-06-02 ENCOUNTER — Ambulatory Visit: Payer: BC Managed Care – PPO | Admitting: Medical

## 2021-06-02 ENCOUNTER — Other Ambulatory Visit: Payer: Self-pay

## 2021-06-02 VITALS — BP 110/70 | HR 80 | Wt 150.4 lb

## 2021-06-02 DIAGNOSIS — G43709 Chronic migraine without aura, not intractable, without status migrainosus: Secondary | ICD-10-CM

## 2021-06-02 DIAGNOSIS — L299 Pruritus, unspecified: Secondary | ICD-10-CM

## 2021-06-02 DIAGNOSIS — R110 Nausea: Secondary | ICD-10-CM

## 2021-06-02 DIAGNOSIS — L989 Disorder of the skin and subcutaneous tissue, unspecified: Secondary | ICD-10-CM

## 2021-06-02 DIAGNOSIS — K219 Gastro-esophageal reflux disease without esophagitis: Secondary | ICD-10-CM

## 2021-06-02 DIAGNOSIS — R350 Frequency of micturition: Secondary | ICD-10-CM

## 2021-06-02 DIAGNOSIS — R300 Dysuria: Secondary | ICD-10-CM

## 2021-06-02 DIAGNOSIS — E039 Hypothyroidism, unspecified: Secondary | ICD-10-CM

## 2021-06-02 DIAGNOSIS — J4540 Moderate persistent asthma, uncomplicated: Secondary | ICD-10-CM

## 2021-06-02 LAB — POCT URINALYSIS DIP (PROADVANTAGE DEVICE)
Blood, UA: NEGATIVE
Glucose, UA: NEGATIVE mg/dL
Leukocytes, UA: NEGATIVE
Nitrite, UA: NEGATIVE
Specific Gravity, Urine: 1.03
Urobilinogen, Ur: NEGATIVE
pH, UA: 6 (ref 5.0–8.0)

## 2021-06-02 NOTE — Patient Instructions (Signed)
  Call insurance to inquire about the "preferred" drugs on your forumlary since Aimovig and Symbicort are too expensive  Emgality, galcanezumab Ajovy, fremanezumab Aimovig, erenumab Vyepti, eptinezumab   Dual/combo maintenance asthma medicaitons Symbicort Advaird Dulera Breo  Single inhaled steroid maintenance asthma medicaiotns Qvar Flovent Pulmicort Asmanex

## 2021-06-02 NOTE — Progress Notes (Signed)
Subjective:  Barbara Odonnell is a 45 y.o. female who presents for Chief Complaint  Patient presents with   Follow-up    Follow-up on medications. Alternative for headache medication ?,having a lot of nausea since covid. Spots on foot from wearing differnet types of shoes. Would urine- symptoms- frequency x 2-3 days.      She still has ongoing nausea since having covid.  Uses zofran about every day.  No vomiting.  No diarrhea, no loose stools.  Smell and taste are fine.  Subtle nausea.    She can feel lightheaded when working outside in the heat.   BP was 86/68 in the heat.   She will then lie down and drink fluids.    She notes a little bladder irritation, some urgency.  Last UTI 18 months ago.  No blood or odor in urine.     Has had some spots on feet she wants looked at.   Not sure if they are infected.  Got rubbed in shoewear.  She does not wear socks in any of her shoes  Migraines - recently she changed to [facility]   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   New insurance doesn't cover Aimiovig and its very expensive.   Prior preventative migraine medicaiton have been Topamax which dint' go well.  She does well on Aimiovig.    GERD - talking Pepcid.  Has had multiple prior EGDs due to hx/o celiac disease.    She still gets itchy periodically.  Itchy all over.  She sees dermatology.  They recently had her stop close items with blue dye.  It is worse between nighttime and wake up time.  No other aggravating or relieving factors.    No other c/o.  The following portions of the patient's history were reviewed and updated as appropriate: allergies, current medications, past family history, past medical history, past social history, past surgical history and problem list.  ROS Otherwise as in subjective above  Objective: BP 110/70   Pulse 80   Wt 150 lb 6.4 oz (68.2 kg)   SpO2 99%   BMI 23.56 kg/m   General appearance: alert, no distress, well  developed, well nourished HEENT: normocephalic, sclerae anicteric, conjunctiva pink and moist, TMs pearly, nares patent, no discharge or erythema, pharynx normal Abdomen: +bs, soft, non tender, non distended, no masses, no hepatomegaly, no splenomegaly Pulses: 2+ radial pulses, 2+ pedal pulses, normal cap refill Ext: no edema Neuro: Nonfocal exam   Assessment: Encounter Diagnoses  Name Primary?   Chronic migraine without aura without status migrainosus, not intractable Yes   Urine frequency    Skin lesion    Chronic nausea    Dysuria    Hypothyroidism, unspecified type    Gastroesophageal reflux disease, unspecified whether esophagitis present    Moderate persistent asthma, unspecified whether complicated    Pruritus      Plan: Chronic migraine-she has been doing well on Aimovig but now the new co-pay is too expensive.  She will call insurance to inquire about coverage for other alternative similar drugs.  She has not done well on Topamax in the past.  Sample given today for Aimovig  Urine frequency-urinalysis normal today.  She will work on good water intake and cut back on caffeine  Skin lesion of feet, abrasion from footwear-she will use Band-Aid and Neosporin for the next several days.  If not resolving then call back or recheck.  No sign of  cellulitis or infection  Asthma-doing fine on Symbicort for maintenance.  However co-pay is expensive.  She will call about options or preferred drugs to her insurance company.  Chronic nausea-she will change some of her medicines such as vitamins to after a meal to see if that helps.  If not improving may need to ultimately go back and see gastroenterology  Hypothyroidism-continue current medication  GERD-cut back on tomatoes, avoid triggers, continue Pepcid   Recommendations: Call insurance to inquire about the "preferred" drugs on your forumlary since Aimovig and Symbicort are too expensive  Emgality, galcanezumab Ajovy,  fremanezumab Aimovig, erenumab Vyepti, eptinezumab   Dual/combo maintenance asthma medicaiton Symbicort Advair Dulera Breo  Single inhaled steroid maintenance asthma medicaiotns Qvar Flovent Pulmicort Asmanex    Sonnia was seen today for follow-up.  Diagnoses and all orders for this visit:  Chronic migraine without aura without status migrainosus, not intractable  Urine frequency -     POCT Urinalysis DIP (Proadvantage Device)  Skin lesion  Chronic nausea  Dysuria  Hypothyroidism, unspecified type  Gastroesophageal reflux disease, unspecified whether esophagitis present  Moderate persistent asthma, unspecified whether complicated  Pruritus   Follow up: pending call back

## 2021-06-15 ENCOUNTER — Other Ambulatory Visit: Payer: Self-pay | Admitting: Medical

## 2021-06-19 ENCOUNTER — Other Ambulatory Visit: Payer: Self-pay | Admitting: Medical

## 2021-06-19 ENCOUNTER — Telehealth: Payer: Self-pay

## 2021-06-19 NOTE — Telephone Encounter (Signed)
P.A. AIMOVIG

## 2021-06-20 ENCOUNTER — Encounter: Payer: Self-pay | Admitting: Cardiology

## 2021-06-20 NOTE — Progress Notes (Signed)
Cardiology Office Note   Date:  06/21/2021   ID:  CAY KATH, DOB 08/10/1976, MRN [id number]  PCP:  Carlena Hurl, PA-C  Cardiologist:   None Referring:  Glade Lloyd Camelia Eng, PA-C  Chief Complaint  Patient presents with   Palpitations       History of Present Illness: Barbara Odonnell is a 45 y.o. female who is referred by Carlena Hurl, PA-C for evaluation of palpitations.   The patient describes palpitations as skipping beats.  She feels these occasionally during the day.  However, she is also have this tingling sensation like something is crawling in her chest or heart.  She points to the mid left upper chest.  This she notices at night.  This is not with palpitations or an irregular heartbeat.  She cannot bring this on.  Echo in 2019 was normal.  This was done to evaluate an abnormal EKG which suggested right atrial enlargement although this was not evident on the echo.  She reported have a stress test in 2014.  This was again done for evaluation of the abnormal EKG.  The patient does have orthostatic symptoms.  She had never been diagnosed with this.  She is a PhD in kinesiology and has this sensation when she is exercising.  She feels lightheaded if she moves quickly.  This will happen with changing positions although she has not had any frank syncope.  She has not had any chest pressure, neck or arm discomfort.  Has had no new shortness of breath, PND or orthopnea.  She exercises vigorously.   Past Medical History:  Diagnosis Date   Allergy    Anemia    IV iron in past, prior to 2018   Asthma    Celiac disease    +prior biopsy   GERD (gastroesophageal reflux disease)    H/O bone density study 12/21/2017   normal   Hip fracture ([place]) 2008   right, Dr. Oneida Alar   History of stress test 2014   [facility]   Hypothyroidism    Migraine headache without aura    gets bad nausea.  does well on Naratriptan and Relpax.  Failed Imitrex, [place]   Stress fracture     severeral prior   Transfusion history    has had IV iron prior, Dr. Marin Olp prior management    Past Surgical History:  Procedure Laterality Date   COLONOSCOPY  2015   [facility], advised to repeat q5 years; x 4 prior   ESOPHAGOGASTRODUODENOSCOPY     x5 prior as of 2016   [place]     left   NASAL SEPTUM SURGERY     POLYPECTOMY     sigmoid colon   SINOSCOPY     WISDOM TOOTH EXTRACTION       Current Outpatient Medications  Medication Sig Dispense Refill   buPROPion (WELLBUTRIN XL) 150 MG 24 hr tablet TAKE 1 TABLET BY MOUTH EVERY DAY 90 tablet 0   [facility] D3 25 MCG (1000 UT) capsule TAKE 1 CAPSULE (1,000 UNITS TOTAL) BY MOUTH DAILY. 100 capsule 3   Erenumab-aooe (AIMOVIG) 70 MG/ML SOAJ INJECT INTO THE SKIN ONCE EVERY 30 DAYS 1 mL 11   etonogestrel-ethinyl estradiol (NUVARING) 0.12-0.015 MG/24HR vaginal ring Place 1 each vaginally every 28 (twenty-eight) days. Insert vaginally and leave in place for 3 consecutive weeks, then remove for 1 week.     FeAspGl-FeFum-B12-FA-C-Succ Ac (FERREX 28) MISC TAKE 1 TABLET BY MOUTH EVERY DAY 90  each 0   hydroquinone 4 % cream SMARTSIG:Sparingly Topical Daily PRN     naratriptan (AMERGE) 2.5 MG tablet TAKE 1 TABLET AS NEEDED FOR MIGRAINE, MAY REPEAT AFTER 4 HOURS (MAX 5MG IN 24HRS) *MAX OF 18 IN 30* 18 tablet 3   ondansetron (ZOFRAN-ODT) 4 MG disintegrating tablet Take 1 tablet (4 mg total) by mouth every 8 (eight) hours as needed for nausea or vomiting. 30 tablet 3   SYMBICORT 160-4.5 MCG/ACT inhaler INHALE 2 PUFFS INTO THE LUNGS IN THE MORNING AND AT BEDTIME. 10.2 each 5   SYNTHROID 50 MCG tablet TAKE 1 TABLET BY MOUTH EVERY DAY 30 tablet 5   tretinoin (RETIN-A) 0.05 % cream SMARTSIG:1 Sparingly Topical Daily     triamcinolone (KENALOG) 0.1 % Apply topically.     valACYclovir (VALTREX) 500 MG tablet TAKE 1 TABLET BY MOUTH EVERY DAY *MYLAN* 30 tablet 2   No current facility-administered medications for this visit.    Allergies:    Sulfa antibiotics, Other, and Singulair [montelukast sodium]    Social History:  The patient  reports that she has never smoked. She has never used smokeless tobacco. She reports current alcohol use of about 1.0 standard drink per week. She reports that she does not use drugs.   Family History:  The patient's family history includes COPD in her maternal grandmother; Cancer in her father; Celiac disease in her brother, brother, and mother; Heart disease in her maternal grandfather and paternal grandfather; Heart disease (age of onset: 10) in her father; Hypertension in her mother; Osteopenia in her maternal grandmother and mother; Pulmonary embolism in her father; Raynaud syndrome in her mother; Stroke in her maternal grandfather and maternal grandmother.    ROS:  Please see the history of present illness.   Otherwise, review of systems are positive for none.   All other systems are reviewed and negative.    PHYSICAL EXAM: VS:  BP 115/77   Pulse 70   Ht 5' 7"  (1.702 m)   Wt 151 lb (68.5 kg)   SpO2 97%   BMI 23.65 kg/m  , BMI Body mass index is 23.65 kg/m. GENERAL:  Well appearing HEENT:  Pupils equal round and reactive, fundi not visualized, oral mucosa unremarkable NECK:  No jugular venous distention, waveform within normal limits, carotid upstroke brisk and symmetric, no bruits, no thyromegaly LYMPHATICS:  No cervical, inguinal adenopathy LUNGS:  Clear to auscultation bilaterally BACK:  No CVA tenderness CHEST:  Unremarkable HEART:  PMI not displaced or sustained,S1 and S2 within normal limits, no S3, no S4, no clicks, no rubs, no murmurs ABD:  Flat, positive bowel sounds normal in frequency in pitch, no bruits, no rebound, no guarding, no midline pulsatile mass, no hepatomegaly, no splenomegaly EXT:  2 plus pulses throughout, no edema, no cyanosis no clubbing SKIN:  No rashes no nodules NEURO:  Cranial nerves II through XII grossly intact, motor grossly intact throughout PSYCH:   Cognitively intact, oriented to person place and time    EKG:  EKG is ordered today. The ekg ordered today demonstrates sinus rhythm, rate 70, axis within normal limits, intervals within normal limits, poor anterior R wave progression, possible right atrial enlargement   Recent Labs: 12/18/2020: ALT 17; BUN 7; Creatinine, Ser 0.92; Hemoglobin 15.3; Platelets 222; Potassium 4.5; Sodium 137; TSH 1.410    Lipid Panel    Component Value Date/Time   CHOL 210 (H) 11/28/2017 0927   TRIG 62 11/28/2017 0927   HDL 82 11/28/2017 0927  CHOLHDL 2.6 11/28/2017 0927   CHOLHDL 2.8 10/08/2015 0001   VLDL 12 10/08/2015 0001   LDLCALC 116 (H) 11/28/2017 0927   LDLDIRECT 136 (H) 09/15/2020 0937      Wt Readings from Last 3 Encounters:  06/21/21 151 lb (68.5 kg)  06/02/21 150 lb 6.4 oz (68.2 kg)  03/16/21 151 lb 6.4 oz (68.7 kg)      Other studies Reviewed: Additional studies/ records that were reviewed today include: Echo 2019. Review of the above records demonstrates:  Please see elsewhere in the note.     ASSESSMENT AND PLAN:  PALPITATIONS: The patient has a couple of different symptoms to include palpitations and tingling sensation.  To further evaluate this she will get a 2-week event monitor.  I do note that labs earlier this year to include TSH and electrolytes were normal.  ORTHOSTATIC HYPOTENSION: Her blood pressure has been was very mildly orthostatic from sitting to standing today.  However, she is probably well-hydrated.  She is aware of what she needs to do to avoid symptoms.  We talked about this again today.  No change in therapy.   Current medicines are reviewed at length with the patient today.  The patient does not have concerns regarding medicines.  The following changes have been made:  no change  Labs/ tests ordered today include:   Orders Placed This Encounter  Procedures   LONG TERM MONITOR (3-14 DAYS)   EKG 12-Lead      Disposition:   FU with me as needed     Signed, Minus Breeding, MD  06/21/2021 1:39 PM    [facility]

## 2021-06-21 ENCOUNTER — Ambulatory Visit (INDEPENDENT_AMBULATORY_CARE_PROVIDER_SITE_OTHER): Payer: BC Managed Care – PPO

## 2021-06-21 ENCOUNTER — Telehealth: Payer: Self-pay | Admitting: *Deleted

## 2021-06-21 ENCOUNTER — Encounter: Payer: Self-pay | Admitting: Cardiology

## 2021-06-21 ENCOUNTER — Ambulatory Visit (INDEPENDENT_AMBULATORY_CARE_PROVIDER_SITE_OTHER): Payer: BC Managed Care – PPO | Admitting: Cardiology

## 2021-06-21 ENCOUNTER — Other Ambulatory Visit: Payer: Self-pay

## 2021-06-21 VITALS — BP 115/77 | HR 70 | Ht 67.0 in | Wt 151.0 lb

## 2021-06-21 DIAGNOSIS — R002 Palpitations: Secondary | ICD-10-CM

## 2021-06-21 NOTE — Telephone Encounter (Signed)
Patient enrolled for [employer] to ship a 14 day Long Term Holter Monitor.  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

## 2021-06-21 NOTE — Patient Instructions (Signed)
Medication Instructions:  NO CHANGES *If you need a refill on your cardiac medications before your next appointment, please call your pharmacy*  Testing/Procedures: 14 day ZIO monitor   Follow-Up: At [facility], you and your health needs are our priority.  As part of our continuing mission to provide you with exceptional heart care, we have created designated Provider Care Teams.  These Care Teams include your primary Cardiologist (physician) and Advanced Practice Providers (APPs -  Physician Assistants and Nurse Practitioners) who all work together to provide you with the care you need, when you need it.  We recommend signing up for the patient portal called "MyChart".  Sign up information is provided on this After Visit Summary.  MyChart is used to connect with patients for Virtual Visits (Telemedicine).  Patients are able to view lab/test results, encounter notes, upcoming appointments, etc.  Non-urgent messages can be sent to your provider as well.   To learn more about what you can do with MyChart, go to [email or web address].    Your next appointment:   AS NEEDED with Dr. Percival Spanish   Other Instructions [place] Monitor Instructions  Your physician has requested you wear a ZIO patch monitor for 14 days.  This is a single patch monitor. [employer] supplies one patch monitor per enrollment. Additional stickers are not available. Please do not apply patch if you will be having a Nuclear Stress Test,  Echocardiogram, Cardiac CT, MRI, or Chest Xray during the period you would be wearing the  monitor. The patch cannot be worn during these tests. You cannot remove and re-apply the  ZIO XT patch monitor.  Your ZIO patch monitor will be mailed 3 day USPS to your address on file. It may take 3-5 days  to receive your monitor after you have been enrolled.  Once you have received your monitor, please review the enclosed instructions. Your monitor  has already been registered  assigning a specific monitor serial # to you.  Billing and Patient Assistance Program Information  We have supplied [employer] with any of your insurance information on file for billing purposes. [employer] offers a sliding scale Patient Assistance Program for patients that do not have  insurance, or whose insurance does not completely cover the cost of the ZIO monitor.  You must apply for the Patient Assistance Program to qualify for this discounted rate.  To apply, please call [employer] at [phone], select option 4, select option 2, ask to apply for  Patient Assistance Program. Theodore Demark will ask your household income, and how many people  are in your household. They will quote your out-of-pocket cost based on that information.  [employer] will also be able to set up a 48-month interest-free payment plan if needed.  Applying the monitor   Shave hair from upper left chest.  Hold abrader disc by orange tab. Rub abrader in 40 strokes over the upper left chest as  indicated in your monitor instructions.  Clean area with 4 enclosed alcohol pads. Let dry.  Apply patch as indicated in monitor instructions. Patch will be placed under collarbone on left  side of chest with arrow pointing upward.  Rub patch adhesive wings for 2 minutes. Remove white label marked "1". Remove the white  label marked "2". Rub patch adhesive wings for 2 additional minutes.  While looking in a mirror, press and release button in center of patch. A small green light will  flash 3-4 times. This will be your only indicator that the monitor has  been turned on.  Do not shower for the first 24 hours. You may shower after the first 24 hours.  Press the button if you feel a symptom. You will hear a small click. Record Date, Time and  Symptom in the Patient Logbook.  When you are ready to remove the patch, follow instructions on the last 2 pages of Patient  Logbook. Stick patch monitor onto the last page of Patient Logbook.  Place  Patient Logbook in the blue and white box. Use locking tab on box and tape box closed  securely. The blue and white box has prepaid postage on it. Please place it in the mailbox as  soon as possible. Your physician should have your test results approximately 7 days after the  monitor has been mailed back to [facility].  Call [facility] at [phone] if you have questions regarding  your ZIO XT patch monitor. Call them immediately if you see an orange light blinking on your  monitor.  If your monitor falls off in less than 4 days, contact our Monitor department at [phone].  If your monitor becomes loose or falls off after 4 days call [employer] at [phone] for  suggestions on securing your monitor

## 2021-06-21 NOTE — Progress Notes (Unsigned)
Patient enrolled for [employer] to ship a 14 day long term holter monitor.  We have requested [employer] ship 9 additional strips.  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

## 2021-06-24 ENCOUNTER — Telehealth: Payer: BC Managed Care – PPO | Admitting: Family Medicine

## 2021-06-24 ENCOUNTER — Other Ambulatory Visit: Payer: Self-pay | Admitting: Medical

## 2021-06-24 ENCOUNTER — Encounter: Payer: Self-pay | Admitting: Family Medicine

## 2021-06-24 ENCOUNTER — Telehealth: Payer: Self-pay

## 2021-06-24 ENCOUNTER — Other Ambulatory Visit: Payer: Self-pay

## 2021-06-24 VITALS — BP 117/73 | HR 98 | Temp 99.4°F | Ht 67.0 in | Wt 151.0 lb

## 2021-06-24 DIAGNOSIS — J0100 Acute maxillary sinusitis, unspecified: Secondary | ICD-10-CM

## 2021-06-24 DIAGNOSIS — H9202 Otalgia, left ear: Secondary | ICD-10-CM | POA: Diagnosis not present

## 2021-06-24 DIAGNOSIS — J3489 Other specified disorders of nose and nasal sinuses: Secondary | ICD-10-CM

## 2021-06-24 MED ORDER — AZITHROMYCIN 250 MG PO TABS: ORAL_TABLET | ORAL | 0 refills | Status: DC

## 2021-06-24 MED ORDER — FLUTICASONE PROPIONATE 50 MCG/ACT NA SUSP: 2.0000 | Freq: Every day | NASAL | 6 refills | Status: DC

## 2021-06-24 NOTE — Progress Notes (Signed)
Start time: 11:25 End time: 11:49  Virtual Visit via Video Note  I connected with Barbara Odonnell on 06/24/21 by a video enabled telemedicine application and verified that I am speaking with the correct person using two identifiers.  Location: Patient: home Provider: office   I discussed the limitations of evaluation and management by telemedicine and the availability of in person appointments. The patient expressed understanding and agreed to proceed.  History of Present Illness:  Chief Complaint  Patient presents with   Facial Pain    VIRTUAL sinus pain and pressure, ear pain and ST-all left sided. ST has gone away over time. Had dental procedure last week and feels like after that she began with symptoms. Wonders if some bacteria got in and she has a sinus infection now. Has taken 4 covid tests and all were negative-last test was yesterday.    She felt run down prior to dental procedure (8/11-13).  Tooth "sheared off", had a lot of anxiety.  Saw dentist 8/16, covered with cement until procedure 8/17.  Required a lot of anesthetic, had a reaction to the epinephrine (cold, achey, like when she has come off prednisone in the past). This was on a lower tooth, not upper.  She had sore throat initially (expected, related to mouth being open and procedure). Then nose and ear started hurting. Continues to have pain at the L cheek, nose, and ear.  Mucus is clear from the nose.  Occasionally she has phlegm/PND, very small amount, nothing discolored. Ear feels plugged, like a dull ache.  Not aware of fever today, did feel clammy. 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  Taking Mucinex 12 hour 641m BID Sudafed 34monce or twice daily. Simply saline Has Flonase at home, hasn't been taking.  H/o iron deficiency.  Taking iron 3x/week.  Last took augmentin 12/2020, zpak in 08/2020  PMH, PSH, SH reviewed  Outpatient Encounter Medications as of  06/24/2021  Medication Sig Note   buPROPion (WELLBUTRIN XL) 150 MG 24 hr tablet TAKE 1 TABLET BY MOUTH EVERY DAY    Calcium Carbonate (CALCIUM 600 PO) Take by mouth.    CLINPRO 5000 1.1 % PSTE Place onto teeth.    [facility]S D3 25 MCG (1000 UT) capsule TAKE 1 CAPSULE (1,000 UNITS TOTAL) BY MOUTH DAILY.    Erenumab-aooe (AIMOVIG) 70 MG/ML SOAJ INJECT INTO THE SKIN ONCE EVERY 30 DAYS    etonogestrel-ethinyl estradiol (NUVARING) 0.12-0.015 MG/24HR vaginal ring Place 1 each vaginally every 28 (twenty-eight) days. Insert vaginally and leave in place for 3 consecutive weeks, then remove for 1 week. 02/27/2019: Pt has it in now   FeAspGl-FeFum-B12-FA-C-Succ Ac (FERREX 28) MISC TAKE 1 TABLET BY MOUTH EVERY DAY 06/24/2021: Takes 3x/week   guaiFENesin (MUCINEX PO) Take 1 tablet by mouth in the morning and at bedtime. 06/24/2021: 60075m Homeopathic Products (ZINC) LOZG Use as directed in the mouth or throat.    pseudoephedrine (SUDAFED) 30 MG tablet Take 30 mg by mouth every 4 (four) hours as needed for congestion.    SIMPLY SALINE NA Place into the nose.    SYMBICORT 160-4.5 MCG/ACT inhaler INHALE 2 PUFFS INTO THE LUNGS IN THE MORNING AND AT BEDTIME.    SYNTHROID 50 MCG tablet TAKE 1 TABLET BY MOUTH EVERY DAY    tretinoin (RETIN-A) 0.05 % cream SMARTSIG:1 Sparingly Topical Daily    vitamin C (ASCORBIC ACID) 500 MG tablet Take 500 mg by mouth daily.    hydroquinone  4 % cream SMARTSIG:Sparingly Topical Daily PRN (Patient not taking: Reported on 06/24/2021)    naratriptan (AMERGE) 2.5 MG tablet TAKE 1 TABLET AS NEEDED FOR MIGRAINE, MAY REPEAT AFTER 4 HOURS (MAX 5MG IN 24HRS) *MAX OF 18 IN 30* (Patient not taking: Reported on 06/24/2021)    ondansetron (ZOFRAN-ODT) 4 MG disintegrating tablet Take 1 tablet (4 mg total) by mouth every 8 (eight) hours as needed for nausea or vomiting. (Patient not taking: Reported on 06/24/2021)    triamcinolone (KENALOG) 0.1 % Apply topically. (Patient not taking: Reported on 06/24/2021)     valACYclovir (VALTREX) 500 MG tablet TAKE 1 TABLET BY MOUTH EVERY DAY *MYLAN* (Patient not taking: Reported on 06/24/2021)    No facility-administered encounter medications on file as of 06/24/2021.   Allergies  Allergen Reactions   Sulfa Antibiotics Anaphylaxis   Other Hives    Dark alcohol   Singulair [Montelukast Sodium]     Angry, irritable, out of skin feeling   ROS:  See HPI. Had COVID in April/May, with persistent nausea off/on since then. Taking zofran 3/week. Denies diarrhea. Some urinary symptoms earlier this week, better now. Feeling more tired, taking 2 hour naps during the day. No bleeding, bruising, rashes other than daily hives on legs.   Observations/Objective:  BP 117/73 Comment: 117 73  Pulse 98   Temp 99.4 F (37.4 C) (Temporal)   Ht 5' 7"  (1.702 m)   Wt 151 lb (68.5 kg)   LMP 05/31/2021 (Exact Date)   SpO2 99%   BMI 23.65 kg/m   Sounds nasal (voice), but no sniffling, throat-clearing or coughing during the visit. She appears well, speaking easily, in no distress She is alert, oriented, in good spirits.  Somewhat anxious when describing recent events related to the dentist. Exam is limited due to virtual nature of the visit  Assessment and Plan:  Sinus pain - sinus congestion vs early infection. Try supportive measures longer  Acute non-recurrent maxillary sinusitis - Pt to start z-pak to treat infection if worsening sinus pain, fever. Cont supportive measures - Plan: azithromycin (ZITHROMAX) 250 MG tablet  Left ear pain - suspect ETD, can't r/o early infection. To start ABX if increasing fever, pain.    Follow Up Instructions:    I discussed the assessment and treatment plan with the patient. The patient was provided an opportunity to ask questions and all were answered. The patient agreed with the plan and demonstrated an understanding of the instructions.   The patient was advised to call back or seek an in-person evaluation if the symptoms  worsen or if the condition fails to improve as anticipated.  I spent 27 minutes dedicated to the care of this patient, including pre-visit review of records, face to face time, post-visit ordering of testing and documentation.    Vikki Ports, MD

## 2021-06-24 NOTE — Telephone Encounter (Signed)
Pt. Called back stating that her nasal spray she had is out of date so she would need you to call in a nasal spray as discussed earlier at her virtual apt with her. She uses [facility].

## 2021-06-24 NOTE — Telephone Encounter (Signed)
Received same info via MyChart message and has been done

## 2021-06-24 NOTE — Patient Instructions (Addendum)
Drink plenty of water. Restart Flonase--I sent a new prescription to your pharmacy, per your request.  If it isn't covered, then get it over-the-counter (some insurances won't cover the prescription because it is over-the-counter; if it is covered, it is usually less expensive through prescription, so I did send it).  Try sinus rinses with the [employer] Sinus Rinse kit as we discussed--this  may work better for you than the Neti-pot. Continue mucinex twice daily, and sudafed as tolerated for sinus pain.  Start the z-pak if worsening sinus pain, discolored mucus, fever, worsening ear pain.  Contact us on day 5 if getting worse rather than improving.  Otherwise, give it 10 days, and if not completely better, call us for a refill to start on day 11.  I hope you feel better soon!

## 2021-06-25 ENCOUNTER — Other Ambulatory Visit: Payer: Self-pay

## 2021-06-25 ENCOUNTER — Ambulatory Visit (HOSPITAL_COMMUNITY)
Admission: EM | Admit: 2021-06-25 | Discharge: 2021-06-25 | Disposition: A | Discharge status: Home / Self Care | Payer: BC Managed Care – PPO | Attending: Student | Admitting: Student

## 2021-06-25 ENCOUNTER — Encounter (HOSPITAL_COMMUNITY): Payer: Self-pay | Admitting: Emergency Medicine

## 2021-06-25 DIAGNOSIS — H66002 Acute suppurative otitis media without spontaneous rupture of ear drum, left ear: Secondary | ICD-10-CM

## 2021-06-25 MED ORDER — AMOXICILLIN-POT CLAVULANATE 875-125 MG PO TABS: 1.0000 | ORAL_TABLET | Freq: Two times a day (BID) | ORAL | 0 refills | Status: DC

## 2021-06-25 NOTE — ED Provider Notes (Signed)
[place]    CSN: [id number] Arrival date & time: 06/25/21  1908      History   Chief Complaint Chief Complaint  Patient presents with   Otalgia    HPI Barbara Odonnell is a 45 y.o. female presenting with ear pain x 2 days. Medical history celiac ds, asthma, GERD, migraine headache.  States she had a televisit 1 day ago and was prescribed azithromycin for left otitis media, this is not providing relief.  Endorses 2 days of left ear pain with some associated swollen lymph nodes.  Denies hearing changes, dizziness, tinnitus.  Denies recent URI, allergic rhinitis.  She also notes that she had a temporary crown placed about 2 weeks ago, this does not seem to be related to current symptoms.  Denies fever/chills, cough, congestion.  HPI  Past Medical History:  Diagnosis Date   Allergy    Anemia    IV iron in past, prior to 2018   Asthma    Celiac disease    +prior biopsy   GERD (gastroesophageal reflux disease)    H/O bone density study 12/21/2017   normal   Hip fracture ([place]) 2008   right, Dr. Oneida Alar   History of stress test 2014   [facility]   Hypothyroidism    Migraine headache without aura    gets bad nausea.  does well on Naratriptan and Relpax.  Failed Imitrex, Maxalt   Stress fracture    severeral prior   Transfusion history    has had IV iron prior, Dr. Marin Olp prior management    Patient Active Problem List   Diagnosis Date Noted   Dysuria 06/02/2021   Urine frequency 06/02/2021   Skin lesion 06/02/2021   Chronic nausea 06/02/2021   Gastroesophageal reflux disease 06/02/2021   Moderate persistent asthma 06/02/2021   Pruritus 06/02/2021   History of COVID-19 03/16/2021   Palpitation 03/16/2021   Nausea 03/16/2021   Hair loss 03/16/2021   Shakes 03/16/2021   History of hip fracture 09/15/2020   Encounter for health maintenance examination in adult 09/15/2020   Moderate persistent asthma without complication 37/62/8315   Need for influenza  vaccination 09/15/2020   Need for Tdap vaccination 09/15/2020   Screening for lipid disorders 09/15/2020   Hives 09/15/2020   History of Epstein-Barr virus infection 01/08/2020   Chronic fatigue 10/23/2019   Raynaud's phenomenon without gangrene 09/24/2019   Pruritic condition 09/24/2019   Hx of colonic polyp 09/13/2019   Vaccine counseling 03/11/2019   History of colitis 03/11/2019   Allergic rhinitis due to pollen 02/04/2019   Knee meniscus pain, right 08/23/2018   Family history of osteoporosis 11/28/2017   History of stress fracture 11/28/2017   GERD (gastroesophageal reflux disease) 04/08/2016   Hypothyroidism 10/08/2015   Chronic migraine without aura without status migrainosus, not intractable 10/08/2015   Celiac disease 10/08/2015   Vitamin D deficiency 10/08/2015   Iron deficiency anemia 10/08/2015    Past Surgical History:  Procedure Laterality Date   COLONOSCOPY  2015   [facility], advised to repeat q5 years; x 4 prior   ESOPHAGOGASTRODUODENOSCOPY     x5 prior as of 2016   [place]     left   NASAL SEPTUM SURGERY     POLYPECTOMY     sigmoid colon   SINOSCOPY     WISDOM TOOTH EXTRACTION      OB History   No obstetric history on file.      Home Medications  Prior to Admission medications   Medication Sig Start Date End Date Taking? Authorizing Provider  amoxicillin-clavulanate (AUGMENTIN) 875-125 MG tablet Take 1 tablet by mouth every 12 (twelve) hours. 06/25/21  Yes Hazel Sams, PA-C  buPROPion (WELLBUTRIN XL) 150 MG 24 hr tablet TAKE 1 TABLET BY MOUTH EVERY DAY 03/31/21   Tysinger, Camelia Eng, PA-C  Calcium Carbonate (CALCIUM 600 PO) Take by mouth.    [provider]  CLINPRO 5000 1.1 % PSTE Place onto teeth. 06/23/21   [provider]  [facility] D3 25 MCG (1000 UT) capsule TAKE 1 CAPSULE (1,000 UNITS TOTAL) BY MOUTH DAILY. 02/26/21   Tysinger, Camelia Eng, PA-C  Erenumab-aooe (AIMOVIG) 70 MG/ML SOAJ INJECT INTO THE SKIN ONCE EVERY  30 DAYS 09/15/20   Tysinger, Camelia Eng, PA-C  etonogestrel-ethinyl estradiol (NUVARING) 0.12-0.015 MG/24HR vaginal ring Place 1 each vaginally every 28 (twenty-eight) days. Insert vaginally and leave in place for 3 consecutive weeks, then remove for 1 week.    [provider]  FeAspGl-FeFum-B12-FA-C-Succ Ac [facility] 28) MISC TAKE 1 TABLET BY MOUTH EVERY DAY 01/27/21   Tysinger, Camelia Eng, PA-C  fluticasone [facility]) 50 MCG/ACT nasal spray Place 2 sprays into both nostrils daily. 06/24/21   Tysinger, Camelia Eng, PA-C  guaiFENesin (MUCINEX PO) Take 1 tablet by mouth in the morning and at bedtime.    [provider]  Homeopathic Products (ZINC) LOZG Use as directed in the mouth or throat.    [provider]  hydroquinone 4 % cream SMARTSIG:Sparingly Topical Daily PRN Patient not taking: Reported on 06/24/2021 10/12/20   [provider]  naratriptan (AMERGE) 2.5 MG tablet TAKE 1 TABLET AS NEEDED FOR MIGRAINE, MAY REPEAT AFTER 4 HOURS (MAX 5MG IN 24HRS) *MAX OF 18 IN 30* Patient not taking: Reported on 06/24/2021 10/09/20   Tysinger, Camelia Eng, PA-C  ondansetron (ZOFRAN-ODT) 4 MG disintegrating tablet Take 1 tablet (4 mg total) by mouth every 8 (eight) hours as needed for nausea or vomiting. Patient not taking: Reported on 06/24/2021 03/16/21   Tysinger, Camelia Eng, PA-C  pseudoephedrine (SUDAFED) 30 MG tablet Take 30 mg by mouth every 4 (four) hours as needed for congestion.    [provider]  SIMPLY SALINE NA Place into the nose.    [provider]  SYMBICORT 160-4.5 MCG/ACT inhaler INHALE 2 PUFFS INTO THE LUNGS IN THE MORNING AND AT BEDTIME. 02/02/21   Tysinger, Camelia Eng, PA-C  SYNTHROID 50 MCG tablet TAKE 1 TABLET BY MOUTH EVERY DAY 06/15/21   Tysinger, Camelia Eng, PA-C  tretinoin (RETIN-A) 0.05 % cream SMARTSIG:1 Sparingly Topical Daily 09/17/20   [provider]  triamcinolone (KENALOG) 0.1 % Apply topically. Patient not taking: Reported on 06/24/2021 01/17/21    [provider]  valACYclovir (VALTREX) 500 MG tablet TAKE 1 TABLET BY MOUTH EVERY DAY *MYLAN* Patient not taking: Reported on 06/24/2021 06/21/21   Tysinger, Camelia Eng, PA-C  vitamin C (ASCORBIC ACID) 500 MG tablet Take 500 mg by mouth daily.    [provider]    Family History Family History  Problem Relation Age of Onset   Hypertension Mother    Osteopenia Mother    Raynaud syndrome Mother    Celiac disease Mother    Cancer Father        asbestos related lung cancer   Heart disease Father 55       Stent   Pulmonary embolism Father    Celiac disease Brother    Celiac disease Brother  Stroke Maternal Grandmother    COPD Maternal Grandmother    Osteopenia Maternal Grandmother    Heart disease Maternal Grandfather    Stroke Maternal Grandfather    Heart disease Paternal Grandfather        died 56yo   Diabetes Neg Hx     Social History Social History   Tobacco Use   Smoking status: Never   Smokeless tobacco: Never  Vaping Use   Vaping Use: Never used  Substance Use Topics   Alcohol use: Yes    Alcohol/week: 1.0 standard drink    Types: 1 Glasses of wine per week    Comment: rare   Drug use: No     Allergies   Sulfa antibiotics, Other, and Singulair [montelukast sodium]   Review of Systems Review of Systems  Constitutional:  Negative for appetite change, chills and fever.  HENT:  Positive for ear pain. Negative for congestion, rhinorrhea, sinus pressure, sinus pain and sore throat.   Eyes:  Negative for redness and visual disturbance.  Respiratory:  Negative for cough, chest tightness, shortness of breath and wheezing.   Cardiovascular:  Negative for chest pain and palpitations.  Gastrointestinal:  Negative for abdominal pain, constipation, diarrhea, nausea and vomiting.  Genitourinary:  Negative for dysuria, frequency and urgency.  Musculoskeletal:  Negative for myalgias.  Neurological:  Negative for dizziness, weakness and headaches.   Psychiatric/Behavioral:  Negative for confusion.   All other systems reviewed and are negative.   Physical Exam Triage Vital Signs ED Triage Vitals  Enc Vitals Group     BP      Pulse      Resp      Temp      Temp src      SpO2      Weight      Height      Head Circumference      Peak Flow      Pain Score      Pain Loc      Pain Edu?      Excl. in [place]?    No data found.  Updated Vital Signs BP (!) 153/81 (BP Location: Right Arm)   Pulse 78   Temp 98.4 F (36.9 C) (Oral)   Resp 16   LMP 05/31/2021 (Exact Date)   SpO2 98%   Visual Acuity Right Eye Distance:   Left Eye Distance:   Bilateral Distance:    Right Eye Near:   Left Eye Near:    Bilateral Near:     Physical Exam Vitals reviewed.  Constitutional:      General: She is not in acute distress.    Appearance: Normal appearance. She is not ill-appearing.  HENT:     Head: Normocephalic and atraumatic.     Right Ear: Tympanic membrane, ear canal and external ear normal. No tenderness. No middle ear effusion. There is no impacted cerumen. Tympanic membrane is not perforated, erythematous, retracted or bulging.     Left Ear: Ear canal and external ear normal. Tenderness present.  No middle ear effusion. There is no impacted cerumen. Tympanic membrane is retracted. Tympanic membrane is not perforated, erythematous or bulging.     Nose: Nose normal. No congestion.     Mouth/Throat:     Mouth: Mucous membranes are moist.     Pharynx: Uvula midline. No oropharyngeal exudate or posterior oropharyngeal erythema.  Eyes:     Extraocular Movements: Extraocular movements intact.     Pupils: Pupils are equal, round,  and reactive to light.  Cardiovascular:     Rate and Rhythm: Normal rate and regular rhythm.     Heart sounds: Normal heart sounds.  Pulmonary:     Effort: Pulmonary effort is normal.     Breath sounds: Normal breath sounds. No decreased breath sounds, wheezing, rhonchi or rales.  Abdominal:      Palpations: Abdomen is soft.     Tenderness: There is no abdominal tenderness. There is no guarding or rebound.  Neurological:     General: No focal deficit present.     Mental Status: She is alert and oriented to person, place, and time.  Psychiatric:        Mood and Affect: Mood normal.        Behavior: Behavior normal.        Thought Content: Thought content normal.        Judgment: Judgment normal.     UC Treatments / Results  Labs (all labs ordered are listed, but only abnormal results are displayed) Labs Reviewed - No data to display  EKG   Radiology No results found.  Procedures Procedures (including critical care time)  Medications Ordered in UC Medications - No data to display  Initial Impression / Assessment and Plan / UC Course  I have reviewed the triage vital signs and the nursing notes.  Pertinent labs & imaging results that were available during my care of the patient were reviewed by me and considered in my medical decision making (see chart for details).     This patient is a very pleasant 45 y.o. year old female presenting with L AOM. Afebrile, nontachy. Stop Azithromycin (prescribed by PCP) and start augmentin. States she is not pregnant or breastfeeding.ED return precautions discussed. Patient verbalizes understanding and agreement.  .   Final Clinical Impressions(s) / UC Diagnoses   Final diagnoses:  Non-recurrent acute suppurative otitis media of left ear without spontaneous rupture of tympanic membrane     Discharge Instructions      -Stop azithromycin and start augmentin -Start the antibiotic-Augmentin (amoxicillin-clavulanate), 1 pill every 12 hours for 7 days.  You can take this with food like with breakfast and dinner.      ED Prescriptions     Medication Sig Dispense Auth. Provider   amoxicillin-clavulanate (AUGMENTIN) 875-125 MG tablet Take 1 tablet by mouth every 12 (twelve) hours. 14 tablet Hazel Sams, PA-C      PDMP  not reviewed this encounter.   Hazel Sams, PA-C 06/26/21 1022

## 2021-06-25 NOTE — ED Triage Notes (Signed)
Two weeks prior had emergency surgery for temporary crown. Received large amounts of lido with epi per patient. After procedure began having flu like symptoms last week that worsened into this week. Was seen by her provider yesterday via telehealth visit. Today, left ear pain has worsened significantly.

## 2021-06-25 NOTE — Discharge Instructions (Addendum)
-  Stop azithromycin and start augmentin -Start the antibiotic-Augmentin (amoxicillin-clavulanate), 1 pill every 12 hours for 7 days.  You can take this with food like with breakfast and dinner.

## 2021-07-04 DIAGNOSIS — M542 Cervicalgia: Secondary | ICD-10-CM | POA: Diagnosis not present

## 2021-07-08 ENCOUNTER — Other Ambulatory Visit: Payer: Self-pay | Admitting: Medical

## 2021-07-09 ENCOUNTER — Telehealth: Payer: Self-pay | Admitting: Medical

## 2021-07-09 NOTE — Telephone Encounter (Signed)
Pt moved cpe to 12/5 PM appt, she would like to come in for fasting labs Prior to CPE  Is that ok and if so can you put orders in

## 2021-07-13 ENCOUNTER — Other Ambulatory Visit: Payer: Self-pay | Admitting: Medical

## 2021-07-13 DIAGNOSIS — E039 Hypothyroidism, unspecified: Secondary | ICD-10-CM

## 2021-07-13 DIAGNOSIS — Z0000 Encounter for general adult medical examination without abnormal findings: Secondary | ICD-10-CM

## 2021-07-13 DIAGNOSIS — D509 Iron deficiency anemia, unspecified: Secondary | ICD-10-CM

## 2021-07-13 DIAGNOSIS — Z13220 Encounter for screening for lipoid disorders: Secondary | ICD-10-CM

## 2021-07-13 NOTE — Telephone Encounter (Signed)
She did not specify. I assume whatever normal labs she would have done at CPE

## 2021-07-22 ENCOUNTER — Other Ambulatory Visit: Payer: Self-pay | Admitting: Medical

## 2021-07-29 ENCOUNTER — Telehealth: Payer: Self-pay

## 2021-07-29 NOTE — Telephone Encounter (Signed)
Ok to give pt samples Aimovig per Audelia Acton

## 2021-08-03 DIAGNOSIS — M8589 Other specified disorders of bone density and structure, multiple sites: Secondary | ICD-10-CM | POA: Diagnosis not present

## 2021-08-03 DIAGNOSIS — Z1231 Encounter for screening mammogram for malignant neoplasm of breast: Secondary | ICD-10-CM | POA: Diagnosis not present

## 2021-08-05 ENCOUNTER — Encounter: Payer: Self-pay | Admitting: Internal Medicine

## 2021-08-05 DIAGNOSIS — Z124 Encounter for screening for malignant neoplasm of cervix: Secondary | ICD-10-CM | POA: Diagnosis not present

## 2021-08-05 DIAGNOSIS — N6331 Unspecified lump in axillary tail of the right breast: Secondary | ICD-10-CM | POA: Diagnosis not present

## 2021-08-05 DIAGNOSIS — Z01419 Encounter for gynecological examination (general) (routine) without abnormal findings: Secondary | ICD-10-CM | POA: Diagnosis not present

## 2021-08-05 DIAGNOSIS — Z3040 Encounter for surveillance of contraceptives, unspecified: Secondary | ICD-10-CM | POA: Diagnosis not present

## 2021-08-06 ENCOUNTER — Other Ambulatory Visit: Payer: Self-pay | Admitting: Obstetrics and Gynecology

## 2021-08-06 DIAGNOSIS — N6331 Unspecified lump in axillary tail of the right breast: Secondary | ICD-10-CM

## 2021-08-20 NOTE — Telephone Encounter (Signed)
Pt Assistance application for Aimovig completed

## 2021-08-23 ENCOUNTER — Other Ambulatory Visit: Payer: Self-pay

## 2021-08-23 ENCOUNTER — Ambulatory Visit
Admission: RE | Admit: 2021-08-23 | Discharge: 2021-08-23 | Disposition: A | Discharge status: Home / Self Care | Payer: BC Managed Care – PPO | Source: Ambulatory Visit | Attending: Obstetrics and Gynecology | Admitting: Obstetrics and Gynecology

## 2021-08-23 ENCOUNTER — Other Ambulatory Visit: Payer: Self-pay | Admitting: Obstetrics and Gynecology

## 2021-08-23 DIAGNOSIS — N6331 Unspecified lump in axillary tail of the right breast: Secondary | ICD-10-CM

## 2021-08-23 DIAGNOSIS — R922 Inconclusive mammogram: Secondary | ICD-10-CM | POA: Diagnosis not present

## 2021-08-30 ENCOUNTER — Telehealth: Payer: Self-pay

## 2021-08-30 NOTE — Telephone Encounter (Signed)
error 

## 2021-08-30 NOTE — Telephone Encounter (Signed)
P.A. was approved just too expensive with insurance & discount card [id number]

## 2021-08-30 NOTE — Telephone Encounter (Signed)
Received response from [employer] need P.A. denial, sent fax stating that Aimovig was not denied, was approved, pt can't afford due to $455 co pay with ins and discount card.  Also pt has $8700 deductible.

## 2021-09-03 DIAGNOSIS — R002 Palpitations: Secondary | ICD-10-CM

## 2021-09-04 DIAGNOSIS — R002 Palpitations: Secondary | ICD-10-CM | POA: Diagnosis not present

## 2021-09-16 ENCOUNTER — Encounter: Payer: 59 | Admitting: Medical

## 2021-09-26 ENCOUNTER — Encounter: Payer: Self-pay | Admitting: Cardiology

## 2021-09-27 ENCOUNTER — Encounter: Payer: BC Managed Care – PPO | Admitting: Medical

## 2021-10-04 ENCOUNTER — Encounter: Payer: BC Managed Care – PPO | Admitting: Medical

## 2021-10-22 NOTE — Telephone Encounter (Signed)
Information was faxed to [place] regarding patients situation, there is no denial letter to fax because patient was approved but has high co pay due to high deductible and can't afford.  Waiting for determination for 2023 since so late in year.

## 2021-10-26 NOTE — Telephone Encounter (Signed)
Waiting response from [place]

## 2021-11-01 ENCOUNTER — Other Ambulatory Visit: Payer: Self-pay | Admitting: Medical

## 2021-11-02 ENCOUNTER — Other Ambulatory Visit: Payer: Self-pay | Admitting: Medical

## 2021-11-02 MED ORDER — ETONOGESTREL-ETHINYL ESTRADIOL 0.12-0.015 MG/24HR VA RING: 1.0000 | VAGINAL_RING | VAGINAL | 0 refills | Status: DC

## 2021-11-02 MED ORDER — BUPROPION HCL ER (XL) 150 MG PO TB24: 150.0000 mg | ORAL_TABLET | Freq: Every day | ORAL | 0 refills | Status: DC

## 2021-11-03 ENCOUNTER — Encounter: Payer: BC Managed Care – PPO | Admitting: Medical

## 2021-12-27 ENCOUNTER — Other Ambulatory Visit: Payer: Self-pay | Admitting: Medical

## 2022-01-10 DIAGNOSIS — L814 Other melanin hyperpigmentation: Secondary | ICD-10-CM | POA: Diagnosis not present

## 2022-01-10 DIAGNOSIS — D1801 Hemangioma of skin and subcutaneous tissue: Secondary | ICD-10-CM | POA: Diagnosis not present

## 2022-01-10 DIAGNOSIS — L988 Other specified disorders of the skin and subcutaneous tissue: Secondary | ICD-10-CM | POA: Diagnosis not present

## 2022-01-10 DIAGNOSIS — D485 Neoplasm of uncertain behavior of skin: Secondary | ICD-10-CM | POA: Diagnosis not present

## 2022-01-10 DIAGNOSIS — L821 Other seborrheic keratosis: Secondary | ICD-10-CM | POA: Diagnosis not present

## 2022-01-10 DIAGNOSIS — D225 Melanocytic nevi of trunk: Secondary | ICD-10-CM | POA: Diagnosis not present

## 2022-01-10 NOTE — Telephone Encounter (Signed)
Called [employer] regarding Patient Assistance application spoke with Valarie Merino who discussed with his [place] and was advised it was application was terminated due to needing approval letter from [place].  Called [employer] t# and they faxed approval letter to me and this was faxed to [place] fax# [phone]

## 2022-01-18 ENCOUNTER — Encounter: Payer: BC Managed Care – PPO | Admitting: Medical

## 2022-01-19 ENCOUNTER — Encounter: Payer: Self-pay | Admitting: Medical

## 2022-01-19 ENCOUNTER — Other Ambulatory Visit: Payer: Self-pay

## 2022-01-19 ENCOUNTER — Telehealth: Payer: BC Managed Care – PPO | Admitting: Medical

## 2022-01-19 VITALS — HR 82 | Temp 98.5°F | Wt 150.0 lb

## 2022-01-19 DIAGNOSIS — R059 Cough, unspecified: Secondary | ICD-10-CM | POA: Diagnosis not present

## 2022-01-19 DIAGNOSIS — J988 Other specified respiratory disorders: Secondary | ICD-10-CM | POA: Diagnosis not present

## 2022-01-19 MED ORDER — AMOXICILLIN 875 MG PO TABS: 875.0000 mg | ORAL_TABLET | Freq: Two times a day (BID) | ORAL | 0 refills | Status: AC

## 2022-01-19 MED ORDER — PREDNISONE 20 MG PO TABS: ORAL_TABLET | ORAL | 0 refills | Status: DC

## 2022-01-19 MED ORDER — FLUCONAZOLE 150 MG PO TABS: 150.0000 mg | ORAL_TABLET | Freq: Once | ORAL | 0 refills | Status: AC

## 2022-01-19 NOTE — Progress Notes (Signed)
?Subjective:  ?  ? Patient ID: Barbara Odonnell, female   DOB: [birth year], 46 y.o.   MRN: [id number] ? ?This visit type was conducted due to national recommendations for restrictions regarding the COVID-19 Pandemic (e.g. social distancing) in an effort to limit this patient's exposure and mitigate transmission in our community.  Due to their co-morbid illnesses, this patient is at least at moderate risk for complications without adequate follow up.  This format is felt to be most appropriate for this patient at this time.   ? ?Documentation for virtual audio and video telecommunications through [place] encounter: ? ?The patient was located at home. ?The provider was located in the office. ?The patient did consent to this visit and is aware of possible charges through their insurance for this visit. ? ?The other persons participating in this telemedicine service were none. ?Time spent on call was 20 minutes and in review of previous records 20 minutes total. ? ?This virtual service is not related to other E/M service within previous 7 days. ? ? ?HPI ?Chief Complaint  ?Patient presents with  ? sick  ?  Sick- fever, congestion Monday-Tuesday "feeling crappy" negative covid test Monday night. Headache  ? ?Virtual consult for illness.   ? ?Feeling a little better today.  Last few days was having chills, fatigue, feeling achy since 5 days ago.   Sleepy.   Feels some dyspnea.   Some sore throat intermittent, some swollen glands at times, but that is typical for her in general.  Has some sinus pressure.  Has 99 temp, but no fever.  No vomiting or diarrhea.  Not sleeping well, tossing and turning.  No thick or bloody mucous.   +headaches.  Using sudafed and mucinex, echinacea.   ?Yesterday felt horrible, used migraine pill, benadryl, zofran.  Has felt sick, not just allergies.   Was down in [employer] recently, sneezing a lot but this feels more than just allergies.  No sick contacts.  Has felt heavy in eyes, but using  allergy eye drops.   ? ?Uses Symbicort regularly.   3-4 weeks ago had to use albuterol, but not having to use albuterol this week.  ? ?Using nasal saline nasal often.  ? ?Water intake is good. ? ? ?Past Medical History:  ?Diagnosis Date  ? Allergy   ? Anemia   ? IV iron in past, prior to 2018  ? Asthma   ? Celiac disease   ? +prior biopsy  ? GERD (gastroesophageal reflux disease)   ? H/O bone density study 12/21/2017  ? normal  ? Hip fracture ([place]) 2008  ? right, Dr. Oneida Alar  ? History of stress test 2014  ? [facility]  ? Hypothyroidism   ? Migraine headache without aura   ? gets bad nausea.  does well on Naratriptan and Relpax.  Failed Imitrex, Maxalt  ? Stress fracture   ? severeral prior  ? Transfusion history   ? has had IV iron prior, Dr. Marin Olp prior management  ? ?Current Outpatient Medications on File Prior to Visit  ?Medication Sig Dispense Refill  ? buPROPion (WELLBUTRIN XL) 150 MG 24 hr tablet Take 1 tablet (150 mg total) by mouth daily. 90 tablet 0  ? Calcium Carbonate (CALCIUM 600 PO) Take by mouth.    ? CLINPRO 5000 1.1 % PSTE Place onto teeth.    ? CVS D3 25 MCG (1000 UT) capsule TAKE 1 CAPSULE (1,000 UNITS TOTAL) BY MOUTH DAILY. 100 capsule 3  ? Erenumab-aooe ([place])  70 MG/ML SOAJ INJECT INTO THE SKIN ONCE EVERY 30 DAYS 1 mL 11  ? etonogestrel-ethinyl estradiol (NUVARING) 0.12-0.015 MG/24HR vaginal ring Place 1 each vaginally every 28 (twenty-eight) days. Insert vaginally and leave in place for 3 consecutive weeks, then remove for 1 week. 1 each 0  ? FeAspGl-FeFum-B12-FA-C-Succ Ac (FERREX 28) MISC TAKE 1 TABLET BY MOUTH EVERY DAY 90 each 0  ? guaiFENesin (MUCINEX PO) Take 1 tablet by mouth in the morning and at bedtime.    ? Homeopathic Products (ZINC) LOZG Use as directed in the mouth or throat.    ? hydroquinone 4 % cream     ? pseudoephedrine (SUDAFED) 30 MG tablet Take 30 mg by mouth every 4 (four) hours as needed for congestion.    ? SIMPLY SALINE NA Place into the nose.    ? SYMBICORT  160-4.5 MCG/ACT inhaler INHALE 2 PUFFS INTO THE LUNGS IN THE MORNING AND AT BEDTIME. 10.2 each 5  ? SYNTHROID 50 MCG tablet TAKE 1 TABLET BY MOUTH EVERY DAY 90 tablet 1  ? tretinoin (RETIN-A) 0.05 % cream SMARTSIG:1 Sparingly Topical Daily    ? triamcinolone (KENALOG) 0.1 % Apply topically.    ? valACYclovir (VALTREX) 500 MG tablet TAKE 1 TABLET BY MOUTH EVERY DAY *MYLAN* 30 tablet 2  ? vitamin C (ASCORBIC ACID) 500 MG tablet Take 500 mg by mouth daily.    ? naratriptan (AMERGE) 2.5 MG tablet TAKE 1 TABLET AS NEEDED FOR MIGRAINE, MAY REPEAT AFTER 4 HOURS (MAX 2 TABS IN 24HRS, 18 TABS/MONTH) (Patient not taking: Reported on 01/19/2022) 18 tablet 3  ? ondansetron (ZOFRAN-ODT) 4 MG disintegrating tablet DISSOLVE 1 TABLET ON THE TONGUE EVERY 8 HOURS AS NEEDED FOR NAUSEA AND VOMITING (Patient not taking: Reported on 01/19/2022) 30 tablet 3  ? ?No current facility-administered medications on file prior to visit.  ? ? ?Review of Systems ?As in subjective ?   ?Objective:  ? Physical Exam ?Due to coronavirus pandemic stay at home measures, patient visit was virtual and they were not examined in person.   ?Pulse 82   Temp 98.5 ?F (36.9 ?C)   Wt 150 lb (68 kg)   SpO2 98%   BMI 23.49 kg/m?  ? ?Gen: wd, wn, nad ?No wheezing, no obvious sob ? ? ? ?   ?Assessment:  ?   ?Encounter Diagnoses  ?Name Primary?  ? Respiratory tract infection Yes  ? Cough, unspecified type   ? ? ?   ?Plan:  ?   ?Symptoms suggest more of a viral upper respiratory tract infection.  Advise she continue her supportive measures she is doing in her regular daily medications.  Does give symptoms a little bit more time to resolve since she does feel better today.  She is on day 5 of symptoms.  If significantly worsening over the next few days or more thick mucus, teeth ache, more sinus pressure over the next 2 days then can consider medications below amoxicillin and prednisone.  Diflucan prescribed in case she gets yeast infection from antibiotic which she has  been known to do in the past. ? ?Otherwise healthy symptoms resolved with supportive measures she is doing already. ? ?She mentioned possibly needing to have sutures removed out of the back of her leg.  Dermatology did a biopsy recently of some have and she is on day 10 of sutures.  Supposed to have them out today or the next day.  She may want to come in person to have these removed  unless her friend who is a Marine scientist does this at home.  She will let me know. ? ?Saarah was seen today for sick. ? ?Diagnoses and all orders for this visit: ? ?Respiratory tract infection ? ?Cough, unspecified type ? ?Other orders ?-     amoxicillin (AMOXIL) 875 MG tablet; Take 1 tablet (875 mg total) by mouth 2 (two) times daily for 10 days. ?-     predniSONE (DELTASONE) 20 MG tablet; 3 tablets daily for 3 days, then 2 tablets daily for 3 days, then 1 tablet daily for 3 days, then 1/2 tablet daily for 3 days. ?-     fluconazole (DIFLUCAN) 150 MG tablet; Take 1 tablet (150 mg total) by mouth once for 1 dose. May repeat in a week if needed ? ?F/u prn ? ?

## 2022-02-21 ENCOUNTER — Telehealth: Payer: Self-pay | Admitting: Internal Medicine

## 2022-02-21 DIAGNOSIS — L509 Urticaria, unspecified: Secondary | ICD-10-CM

## 2022-02-21 NOTE — Telephone Encounter (Signed)
Pt called and left voicemail that she would like a referral to [facility] Dr. Evie Lacks Fax [phone] as she is still having Hives all over body and dermatology here can not figure it out. Please advise if ok  ?

## 2022-02-22 NOTE — Addendum Note (Signed)
Addended by: Minette Headland A on: 02/22/2022 09:25 AM ? ? Modules accepted: Orders ? ?

## 2022-02-22 NOTE — Addendum Note (Signed)
Addended by: Minette Headland A on: 02/22/2022 07:42 AM ? ? Modules accepted: Orders ? ?

## 2022-02-23 ENCOUNTER — Encounter: Payer: BC Managed Care – PPO | Admitting: Medical

## 2022-03-09 ENCOUNTER — Encounter: Payer: BC Managed Care – PPO | Admitting: Medical

## 2022-03-15 DIAGNOSIS — R451 Restlessness and agitation: Secondary | ICD-10-CM | POA: Diagnosis not present

## 2022-03-15 DIAGNOSIS — J4530 Mild persistent asthma, uncomplicated: Secondary | ICD-10-CM | POA: Diagnosis not present

## 2022-03-15 DIAGNOSIS — L309 Dermatitis, unspecified: Secondary | ICD-10-CM | POA: Diagnosis not present

## 2022-03-15 DIAGNOSIS — Z79899 Other long term (current) drug therapy: Secondary | ICD-10-CM | POA: Diagnosis not present

## 2022-03-15 DIAGNOSIS — Z7982 Long term (current) use of aspirin: Secondary | ICD-10-CM | POA: Diagnosis not present

## 2022-03-15 DIAGNOSIS — T781XXA Other adverse food reactions, not elsewhere classified, initial encounter: Secondary | ICD-10-CM | POA: Diagnosis not present

## 2022-03-15 DIAGNOSIS — L299 Pruritus, unspecified: Secondary | ICD-10-CM | POA: Diagnosis not present

## 2022-03-24 ENCOUNTER — Telehealth: Payer: Self-pay

## 2022-03-24 ENCOUNTER — Ambulatory Visit (INDEPENDENT_AMBULATORY_CARE_PROVIDER_SITE_OTHER): Payer: BC Managed Care – PPO | Admitting: Medical

## 2022-03-24 VITALS — BP 110/70 | HR 100 | Ht 67.75 in | Wt 156.4 lb

## 2022-03-24 DIAGNOSIS — E559 Vitamin D deficiency, unspecified: Secondary | ICD-10-CM

## 2022-03-24 DIAGNOSIS — Z7185 Encounter for immunization safety counseling: Secondary | ICD-10-CM

## 2022-03-24 DIAGNOSIS — Z91018 Allergy to other foods: Secondary | ICD-10-CM

## 2022-03-24 DIAGNOSIS — L989 Disorder of the skin and subcutaneous tissue, unspecified: Secondary | ICD-10-CM

## 2022-03-24 DIAGNOSIS — Z13220 Encounter for screening for lipoid disorders: Secondary | ICD-10-CM

## 2022-03-24 DIAGNOSIS — R7989 Other specified abnormal findings of blood chemistry: Secondary | ICD-10-CM

## 2022-03-24 DIAGNOSIS — K219 Gastro-esophageal reflux disease without esophagitis: Secondary | ICD-10-CM | POA: Diagnosis not present

## 2022-03-24 DIAGNOSIS — E039 Hypothyroidism, unspecified: Secondary | ICD-10-CM

## 2022-03-24 DIAGNOSIS — Z0000 Encounter for general adult medical examination without abnormal findings: Secondary | ICD-10-CM

## 2022-03-24 DIAGNOSIS — J4540 Moderate persistent asthma, uncomplicated: Secondary | ICD-10-CM

## 2022-03-24 DIAGNOSIS — Z23 Encounter for immunization: Secondary | ICD-10-CM

## 2022-03-24 DIAGNOSIS — M25561 Pain in right knee: Secondary | ICD-10-CM

## 2022-03-24 DIAGNOSIS — L299 Pruritus, unspecified: Secondary | ICD-10-CM

## 2022-03-24 NOTE — Telephone Encounter (Signed)
error 

## 2022-03-24 NOTE — Progress Notes (Signed)
Subjective:   HPI  Barbara Odonnell is a 46 y.o. female who presents for Chief Complaint  Patient presents with   nonfasting cpe    Nonfasting cpe, follow-up on kidney function and Rash- went to allergist for this. Follow-up on Migraine    Patient Care Team: Arelia Volpe, Leward Quan as PCP - General (Family Medicine)  Dr. Aloha Gell, gyn Sees dentist Sees eye doctor Dr. Evie Lacks, allergist Dr. Minus Breeding, cardiology Dr. Ephriam Jenkins, rheumatology Dr. Lorrene Reid, GI, [place], [place]   Concerns: She has some specific labs she wants checked today.  Saw allergist recnetly about rash.  They felt she go back to dermatology.  Dermatology not sure about it either.   The rash interferes with sleep.   Trying to avoid certain dyes, avoiding histamine causing foods. Doesn't thinks is hives.   Did serum allergy testing for peanuts, tree nuts, cabbage, garlic, eggs, and other tests.  Was allergic to seemingly everything testing.  Wants a corn allergy test.  The rash tends to be very fine little bumps that will show up all over and be itchy.  She had a biopsy through dermatology that showed nothing specific.  Migraines-cannot afford Aimovig.  It works well for her but her deductible is so high that she cannot afford the co-pay.  She wants advice on other options for abortive therapy and prevention.  She did not do well on Topamax in the past.  She has used Amerge and it helps but it is close to $90 per prescription.  Prednisone 50 mg dose helps acute migraines.  She has used Imitrex in the past but it was not her favorite.  Other abortive therapies she uses include Compazine, Advil, Zofran.  A neurologist in the past recommended she have doxepin on hand for sleep.  Higher doses knocked her out.  Hypothyroid -compliant with medicine.  No particular issues  History of celiac disease-diet controlled, diet has been gluten free since 2013  Right femoral neck fracture in the past, no  prior surgery  She has history of stress fractures in both tibias in the past   Past Medical History:  Diagnosis Date   Allergy    Anemia    IV iron in past, prior to 2018   Asthma    Celiac disease    +prior biopsy   GERD (gastroesophageal reflux disease)    H/O bone density study 12/21/2017   normal   Hip fracture ([place]) 2008   right, Dr. Oneida Alar   History of stress test 2014   [facility]   Hypothyroidism    Migraine headache without aura    gets bad nausea.  does well on Naratriptan and Relpax.  Failed Imitrex, [place]   Stress fracture    severeral prior   Transfusion history    has had IV iron prior, Dr. Marin Olp prior management    Past Surgical History:  Procedure Laterality Date   COLONOSCOPY  2015   [facility], advised to repeat q5 years; x 4 prior   ESOPHAGOGASTRODUODENOSCOPY     x5 prior as of 2016   [facility]     left   NASAL SEPTUM SURGERY     POLYPECTOMY     sigmoid colon   SINOSCOPY     WISDOM TOOTH EXTRACTION      Social History   Socioeconomic History   Marital status: Single    Spouse name: Not on file   Number of children: Not on file  Years of education: Not on file   Highest education level: Not on file  Occupational History   Not on file  Tobacco Use   Smoking status: Never   Smokeless tobacco: Never  Vaping Use   Vaping Use: Never used  Substance and Sexual Activity   Alcohol use: Yes    Alcohol/week: 1.0 standard drink    Types: 1 Glasses of wine per week    Comment: rare   Drug use: No   Sexual activity: Not on file  Other Topics Concern   Not on file  Social History Narrative   Lives alone, 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   Prior worked as professor at [employer]  Exercise:- 6-7 days per week, teaches spin at [employer], swam competitively in college, marathon runner.       [place] [facility]:   Originally from  [place]. Has also lived in [place], [place], & [place].  Currently working in counseling. No pets currently. No bird exposure. No mold or hot tub exposure. Currently has carpet in the bedroom. No draperies. No exposure to asbestos.      Social Determinants of Health   Financial Resource Strain: Not on file  Food Insecurity: Not on file  Transportation Needs: Not on file  Physical Activity: Not on file  Stress: Not on file  Social Connections: Not on file  Intimate Partner Violence: Not on file    Family History  Problem Relation Age of Onset   Hypertension Mother    Osteopenia Mother    Raynaud syndrome Mother    Celiac disease Mother    Cancer Father        asbestos related lung cancer   Heart disease Father 54       Stent   Pulmonary embolism Father    Celiac disease Brother    Celiac disease Brother    Stroke Maternal Grandmother    COPD Maternal Grandmother    Osteopenia Maternal Grandmother    Heart disease Maternal Grandfather    Stroke Maternal Grandfather    Heart disease Paternal Grandfather        died 43yo   Diabetes Neg Hx      Current Outpatient Medications:    buPROPion (WELLBUTRIN XL) 150 MG 24 hr tablet, Take 1 tablet (150 mg total) by mouth daily., Disp: 90 tablet, Rfl: 0   CLINPRO 5000 1.1 % PSTE, Place onto teeth., Disp: , Rfl:    [employer] D3 25 MCG (1000 UT) capsule, TAKE 1 CAPSULE (1,000 UNITS TOTAL) BY MOUTH DAILY., Disp: 100 capsule, Rfl: 3   etonogestrel-ethinyl estradiol (NUVARING) 0.12-0.015 MG/24HR vaginal ring, Place 1 each vaginally every 28 (twenty-eight) days. Insert vaginally and leave in place for 3 consecutive weeks, then remove for 1 week., Disp: 1 each, Rfl: 0   Homeopathic Products (ZINC) LOZG, Use as directed in the mouth or throat., Disp: , Rfl:    hydroquinone 4 % cream, , Disp: , Rfl:    predniSONE (DELTASONE) 50 MG tablet, 1 tablet as needed for migraine acute therapy, Disp: 10 tablet, Rfl: 0   SUMAtriptan (IMITREX) 100 MG tablet, Take 1 tablet  (100 mg total) by mouth every 2 (two) hours as needed for migraine. May repeat in 2 hours if headache persists or recurs., Disp: 10 tablet, Rfl: 2   tretinoin (RETIN-A) 0.05 % cream, SMARTSIG:1 Sparingly Topical Daily, Disp: , Rfl:    triamcinolone (KENALOG) 0.1 %, Apply topically., Disp: ,  Rfl:    vitamin C (ASCORBIC ACID) 500 MG tablet, Take 500 mg by mouth daily., Disp: , Rfl:    Albuterol Sulfate, sensor, (PROAIR DIGIHALER) 108 (90 Base) MCG/ACT AEPB, Inhale 2 puffs into the lungs as needed., Disp: 1 each, Rfl: 2   budesonide-formoterol (SYMBICORT) 160-4.5 MCG/ACT inhaler, Inhale 2 puffs into the lungs 2 (two) times daily. in the morning and at bedtime., Disp: 10.2 each, Rfl: 11   FeAspGl-FeFum-B12-FA-C-Succ Ac (FERREX 28) MISC, Take 1 tablet by mouth daily., Disp: 90 each, Rfl: 3   levothyroxine (SYNTHROID) 50 MCG tablet, Take 1 tablet (50 mcg total) by mouth daily., Disp: 90 tablet, Rfl: 3   ondansetron (ZOFRAN-ODT) 4 MG disintegrating tablet, DISSOLVE 1 TABLET ON THE TONGUE EVERY 8 HOURS AS NEEDED FOR NAUSEA AND VOMITING, Disp: 30 tablet, Rfl: 3   valACYclovir (VALTREX) 500 MG tablet, TAKE 1 TABLET BY MOUTH EVERY DAY *MYLAN*, Disp: 30 tablet, Rfl: 2  Allergies  Allergen Reactions   Sulfa Antibiotics Anaphylaxis   Other Hives    Dark alcohol   Singulair [Montelukast Sodium]     Angry, irritable, out of skin feeling    Reviewed their medical, surgical, family, social, medication, and allergy history and updated chart as appropriate.   Review of Systems  Constitutional:  Negative for chills, fever, malaise/fatigue and weight loss.  HENT:  Negative for congestion, ear pain, hearing loss, sore throat and tinnitus.   Eyes:  Negative for blurred vision, pain and redness.  Respiratory:  Negative for cough, hemoptysis and shortness of breath.   Cardiovascular:  Negative for chest pain, palpitations, orthopnea, claudication and leg swelling.  Gastrointestinal:  Negative for abdominal pain,  blood in stool, constipation, diarrhea, nausea and vomiting.  Genitourinary:  Negative for dysuria, flank pain, frequency, hematuria and urgency.  Musculoskeletal:  Negative for falls, joint pain and myalgias.  Skin:  Positive for itching and rash.  Neurological:  Negative for dizziness, tingling, speech change, weakness and headaches.  Endo/Heme/Allergies:  Negative for polydipsia. Does not bruise/bleed easily.  Psychiatric/Behavioral:  Negative for depression and memory loss. The patient is not nervous/anxious and does not have insomnia.        Objective:  BP 110/70   Pulse 100   Ht 5' 7.75" (1.721 m)   Wt 156 lb 6.4 oz (70.9 kg)   BMI 23.96 kg/m     Wt Readings from Last 3 Encounters:  03/24/22 156 lb 6.4 oz (70.9 kg)  01/19/22 150 lb (68 kg)  06/24/21 151 lb (68.5 kg)   Physical Exam Vitals and nursing note reviewed.  Constitutional:      General: She is not in acute distress.    Appearance: Normal appearance. She is not ill-appearing.  HENT:     Head: Normocephalic and atraumatic.     Right Ear: External ear normal.     Left Ear: External ear normal.     Nose: Nose normal.  Eyes:     Extraocular Movements: Extraocular movements intact.     Conjunctiva/sclera: Conjunctivae normal.     Pupils: Pupils are equal, round, and reactive to light.  Neck:     Vascular: No carotid bruit.  Cardiovascular:     Rate and Rhythm: Normal rate and regular rhythm.     Pulses: Normal pulses.     Heart sounds: Normal heart sounds. No murmur heard. Pulmonary:     Effort: Pulmonary effort is normal. No respiratory distress.     Breath sounds: Normal breath sounds. No wheezing or rales.  Abdominal:     General: Bowel sounds are normal. There is no distension.     Palpations: Abdomen is soft. There is no mass.     Tenderness: There is no abdominal tenderness.     Hernia: No hernia is present.  Musculoskeletal:        General: No swelling, tenderness or deformity. Normal range of  motion.     Cervical back: Normal range of motion and neck supple. No tenderness.     Right lower leg: No edema.     Left lower leg: No edema.  Lymphadenopathy:     Cervical: No cervical adenopathy.  Skin:    General: Skin is warm and dry.     Capillary Refill: Capillary refill takes less than 2 seconds.     Findings: No rash.  Neurological:     Mental Status: She is alert and oriented to person, place, and time. Mental status is at baseline.     Cranial Nerves: No cranial nerve deficit.     Sensory: No sensory deficit.     Motor: No weakness.     Gait: Gait normal.     Deep Tendon Reflexes: Reflexes normal.  Psychiatric:        Mood and Affect: Mood normal.        Behavior: Behavior normal.        Judgment: Judgment normal.       Assessment and Plan :   Encounter Diagnoses  Name Primary?   Encounter for health maintenance examination in adult Yes   Need for pneumococcal vaccination    Gastroesophageal reflux disease, unspecified whether esophagitis present    Vitamin D deficiency    Vaccine counseling    Skin lesion    Pruritus    Hypothyroidism, unspecified type    Knee meniscus pain, right    Moderate persistent asthma, unspecified whether complicated    Elevated serum creatinine    Food allergy    Screening for lipid disorders     This visit was a preventative care visit, also known as wellness visit or routine physical.   Topics typically include healthy lifestyle, diet, exercise, preventative care, vaccinations, sick and well care, proper use of emergency dept and after hours care, as well as other concerns.     Recommendations: Continue to return yearly for your annual wellness and preventative care visits.  This gives [place] a chance to discuss healthy lifestyle, exercise, vaccinations, review your chart record, and perform screenings where appropriate.  I recommend you see your eye doctor yearly for routine vision care.  I recommend you see your dentist  yearly for routine dental care including hygiene visits twice yearly.  See your gynecologist yearly for routine gynecological care.   Vaccination recommendations were reviewed Immunization History  Administered Date(s) Administered   HPV Quadrivalent 10/31/2006, 10/31/2006   Influenza Split 09/17/2012, 07/26/2013   Influenza,inj,Quad PF,6+ Mos 11/24/2016, 10/28/2018, 09/24/2019, 09/15/2020   Influenza-Unspecified 10/04/2015   PFIZER(Purple Top)SARS-COV-2 Vaccination 11/20/2019, 12/11/2019, 06/21/2020   Pneumococcal Polysaccharide-23 03/24/2022   Tdap 09/15/2020    I recommend pneumococcal vaccine  Counseled on the pneumococcal vaccine.  Vaccine information sheet given.  Pneumococcal vaccine PPSV23 given after consent obtained.    Screening for cancer: Colon cancer screening: I reviewed your colonoscopy on file that is up to date from 2020.  She will take home fecal tube screen.  Breast cancer screening: You should perform a self breast exam monthly.   I reviewed your recent 2022 breast imaging results including  [place] and mammogram that found nothing worrisome  Cervical cancer screening: We reviewed recommendations for pap smear screening.  I reviwed your 2021 pap showing negative for HPV and negative for cancer cells   Skin cancer screening: Check your skin regularly for new changes, growing lesions, or other lesions of concern Come in for evaluation if you have skin lesions of concern.  Lung cancer screening: If you have a greater than 20 pack year history of tobacco use, then you may qualify for lung cancer screening with a chest CT scan.   Please call your insurance company to inquire about coverage for this test.  We currently don't have screenings for other cancers besides breast, cervical, colon, and lung cancers.  If you have a strong family history of cancer or have other cancer screening concerns, please let me know.    Bone health: Get at least 150 minutes of  aerobic exercise weekly Get weight bearing exercise at least once weekly Bone density test:  A bone density test is an imaging test that uses a type of X-ray to measure the amount of calcium and other minerals in your bones. The test may be used to diagnose or screen you for a condition that causes weak or thin bones (osteoporosis), predict your risk for a broken bone (fracture), or determine how well your osteoporosis treatment is working. The bone density test is recommended for females 44 and older, or females or males <39 if certain risk factors such as thyroid disease, long term use of steroids such as for asthma or rheumatological issues, vitamin D deficiency, estrogen deficiency, family history of osteoporosis, self or family history of fragility fracture in first degree relative.  I reviewed your bone density scan showing osteopenia from 2020 in the chart record.    Heart health: Get at least 150 minutes of aerobic exercise weekly Limit alcohol It is important to maintain a healthy blood pressure and healthy cholesterol numbers  Heart disease screening: Screening for heart disease includes screening for blood pressure, fasting lipids, glucose/diabetes screening, BMI height to weight ratio, reviewed of smoking status, physical activity, and diet.    Goals include blood pressure 120/80 or less, maintaining a healthy lipid/cholesterol profile, preventing diabetes or keeping diabetes numbers under good control, not smoking or using tobacco products, exercising most days per week or at least 150 minutes per week of exercise, and eating healthy variety of fruits and vegetables, healthy oils, and avoiding unhealthy food choices like fried food, fast food, high sugar and high cholesterol foods.    Other tests may possibly include EKG test, CT coronary calcium score, echocardiogram, exercise treadmill stress test.   Saw cardiology late 2022, holter testing with mostly NSR, and no major  concerns.    Medical care options: I recommend you continue to seek care here first for routine care.  We try really hard to have available appointments Monday through Friday daytime hours for sick visits, acute visits, and physicals.  Urgent care should be used for after hours and weekends for significant issues that cannot wait till the next day.  The emergency department should be used for significant potentially life-threatening emergencies.  The emergency department is expensive, can often have long wait times for less significant concerns, so try to utilize primary care, urgent care, or telemedicine when possible to avoid unnecessary trips to the emergency department.  Virtual visits and telemedicine have been introduced since the pandemic started in 2020, and can be convenient ways to receive medical care.  We offer virtual appointments as well to assist you in a variety of options to seek medical care.   Advanced Directives: I recommend you consider completing a [place] and Living Will.   These documents respect your wishes and help alleviate burdens on your loved ones if you were to become terminally ill or be in a position to need those documents enforced.    You can complete Advanced Directives yourself, have them notarized, then have copies made for our office, for you and for anybody you feel should have them in safe keeping.  Or, you can have an attorney prepare these documents.   If you haven't updated your Last Will and Testament in a while, it may be worthwhile having an attorney prepare these documents together and save on some costs.       I reviewed recent labs she had done through [facility] on Mar 15, 2022.  These labs included negative SPEP, negative serology for hepatitis B and C, negative HIV test.  Several positive allergy tests were done.  Comprehensive metabolic panel was normal except for creatinine slightly elevated.  She can  return at her convenience for fasting lipid panel   Separate significant issues discussed: Migraines-we discussed possible preventative and abortive therapies.  She has a high deductible plan so cannot afford Amiovig that was working well for her.  We will consider some options for therapy and call her back.    Preventative therapy: Aimovig was working but this is too expensive currently Other options include nortriptyline or amitriptyline, beta-blocker such as metoprolol, gabapentin.  You have prior not tolerated Topamax Since the Aimovig is cost prohibitive currently, it may be worth trying either nortriptyline, or considering something like sertraline in place of Wellbutrin as similar medicines have been shown to help menstrual migraines or hormonally induced migraines  Migraine abortive therapy: Initial options can include Imitrex or Amerge, Excedrin Migraine over-the-counter, or other over-the-counter analgesic of your choice A second initial option can include Fioricet, or you can use Fioricet if the initial option above does not help after 2 to 3 hours Step 3 would be prednisone pulse dose If the migraine continues to be a problem without resolving then come in for treatment  There is a long list of abortive therapies on the market.  If you would like a list of headache medication names to check good Rx or check with your insurance let me know.   Elevated creatinine-she is seeing nephrologist prior for this.  Updated labs today.  Asthma - doing fine on Symbicort once daily, albuterol as needed  Hypothyroid -compliant with medicine.  Labs today  Anemia -compliant with iron regularly, no heavy bleeding menstrually, labs today  Anxiety - Doing fine on Wellbutrin.     Hypothyroidism - compliant with Synthroid, labs today.  History of allergies, rash -has a history of skin reaction, hives, allergies.  She thinks dark ciders and alcohols make her itch.  Thinks blue and gray dye can  give her problems, hives.  I reviewed some older allergy testing she had in 2019 showing allergies to grasses, weeds, trees, peanuts, soy beans, sesame, almond, cabbage, cantaloupe, watermelon, ginger, garlic.  We will check a corn allergy test today at her request.  History of celiac disease-diet controlled, diet has been gluten free since 2013  Right femoral neck fracture in the past, no prior surgery  She has history of stress fractures in both tibias in the past   Barbara Odonnell  was seen today for nonfasting cpe.  Diagnoses and all orders for this visit:  Encounter for health maintenance examination in adult -     CBC with Differential/Platelet -     Sedimentation rate -     Renal Function Panel -     Corn IgE -     TSH + free T4 -     Iron -     Lipid panel; Future  Need for pneumococcal vaccination -     Pneumococcal polysaccharide vaccine 23-valent greater than or equal to 2yo subcutaneous/IM  Gastroesophageal reflux disease, unspecified whether esophagitis present  Vitamin D deficiency  Vaccine counseling  Skin lesion  Pruritus  Hypothyroidism, unspecified type -     TSH + free T4  Knee meniscus pain, right  Moderate persistent asthma, unspecified whether complicated  Elevated serum creatinine -     Renal Function Panel  Food allergy -     Corn IgE  Screening for lipid disorders -     Lipid panel; Future  Other orders -     predniSONE (DELTASONE) 50 MG tablet; 1 tablet as needed for migraine acute therapy -     SUMAtriptan (IMITREX) 100 MG tablet; Take 1 tablet (100 mg total) by mouth every 2 (two) hours as needed for migraine. May repeat in 2 hours if headache persists or recurs. -     Albuterol Sulfate, sensor, (PROAIR DIGIHALER) 108 (90 Base) MCG/ACT AEPB; Inhale 2 puffs into the lungs as needed. -     FeAspGl-FeFum-B12-FA-C-Succ Ac (FERREX 28) MISC; Take 1 tablet by mouth daily. -     ondansetron (ZOFRAN-ODT) 4 MG disintegrating tablet; DISSOLVE 1 TABLET ON  THE TONGUE EVERY 8 HOURS AS NEEDED FOR NAUSEA AND VOMITING -     budesonide-formoterol (SYMBICORT) 160-4.5 MCG/ACT inhaler; Inhale 2 puffs into the lungs 2 (two) times daily. in the morning and at bedtime. -     levothyroxine (SYNTHROID) 50 MCG tablet; Take 1 tablet (50 mcg total) by mouth daily. -     valACYclovir (VALTREX) 500 MG tablet; TAKE 1 TABLET BY MOUTH EVERY DAY *MYLAN*      Follow-up pending labs, yearly for physical

## 2022-03-24 NOTE — Telephone Encounter (Signed)
[facility] [place] for Barbara Odonnell and patient DOES NOT qualify for patient assistance with this company due to her insurance covers this even though it cost $455 a month with co pay card and she can't afford this.  We only have the 177m sample in the frig.  Sent SAudelia Actona message to see if he would like to change to a different medication

## 2022-03-25 ENCOUNTER — Other Ambulatory Visit: Payer: Self-pay | Admitting: Medical

## 2022-03-25 MED ORDER — NARATRIPTAN HCL 2.5 MG PO TABS: ORAL_TABLET | ORAL | 3 refills | Status: DC

## 2022-03-25 MED ORDER — BUTALBITAL-APAP-CAFFEINE 50-325-40 MG PO TABS: 1.0000 | ORAL_TABLET | Freq: Four times a day (QID) | ORAL | 2 refills | Status: AC | PRN

## 2022-03-25 MED ORDER — FERREX 28 PO MISC: 1.0000 | Freq: Every day | ORAL | 3 refills | Status: DC

## 2022-03-25 MED ORDER — RIZATRIPTAN BENZOATE 10 MG PO TABS: 10.0000 mg | ORAL_TABLET | Freq: Once | ORAL | 2 refills | Status: DC | PRN

## 2022-03-25 MED ORDER — VALACYCLOVIR HCL 500 MG PO TABS: ORAL_TABLET | ORAL | 2 refills | Status: DC

## 2022-03-25 MED ORDER — PROAIR DIGIHALER 108 (90 BASE) MCG/ACT IN AEPB: 2.0000 | INHALATION_SPRAY | RESPIRATORY_TRACT | 2 refills | Status: DC | PRN

## 2022-03-25 MED ORDER — PREDNISONE 50 MG PO TABS: ORAL_TABLET | ORAL | 0 refills | Status: DC

## 2022-03-25 MED ORDER — ONDANSETRON 4 MG PO TBDP: ORAL_TABLET | ORAL | 3 refills | Status: DC

## 2022-03-25 MED ORDER — BUDESONIDE-FORMOTEROL FUMARATE 160-4.5 MCG/ACT IN AERO: 2.0000 | INHALATION_SPRAY | Freq: Two times a day (BID) | RESPIRATORY_TRACT | 11 refills | Status: DC

## 2022-03-25 MED ORDER — SUMATRIPTAN SUCCINATE 100 MG PO TABS: 100.0000 mg | ORAL_TABLET | ORAL | 2 refills | Status: DC | PRN

## 2022-03-25 MED ORDER — LEVOTHYROXINE SODIUM 50 MCG PO TABS: 50.0000 ug | ORAL_TABLET | Freq: Every day | ORAL | 3 refills | Status: DC

## 2022-03-25 NOTE — Patient Instructions (Signed)
This visit was a preventative care visit, also known as wellness visit or routine physical.   Topics typically include healthy lifestyle, diet, exercise, preventative care, vaccinations, sick and well care, proper use of emergency dept and after hours care, as well as other concerns.     Recommendations: Continue to return yearly for your annual wellness and preventative care visits.  This gives [place] a chance to discuss healthy lifestyle, exercise, vaccinations, review your chart record, and perform screenings where appropriate.  I recommend you see your eye doctor yearly for routine vision care.  I recommend you see your dentist yearly for routine dental care including hygiene visits twice yearly.  See your gynecologist yearly for routine gynecological care.   Vaccination recommendations were reviewed Immunization History  Administered Date(s) Administered   HPV Quadrivalent 10/31/2006, 10/31/2006   Influenza Split 09/17/2012, 07/26/2013   Influenza,inj,Quad PF,6+ Mos 11/24/2016, 10/28/2018, 09/24/2019, 09/15/2020   Influenza-Unspecified 10/04/2015   PFIZER(Purple Top)SARS-COV-2 Vaccination 11/20/2019, 12/11/2019, 06/21/2020   Pneumococcal Polysaccharide-23 03/24/2022   Tdap 09/15/2020    I recommend pneumococcal vaccine  Counseled on the pneumococcal vaccine.  Vaccine information sheet given.  Pneumococcal vaccine PPSV23 given after consent obtained.    Screening for cancer: Colon cancer screening: I reviewed your colonoscopy on file that is up to date from 2020.  She will take home fecal tube screen.  Breast cancer screening: You should perform a self breast exam monthly.   I reviewed your recent 2022 breast imaging results including [place] and mammogram that found nothing worrisome  Cervical cancer screening: We reviewed recommendations for pap smear screening.  I reviwed your 2021 pap showing negative for HPV and negative for cancer cells   Skin cancer screening: Check  your skin regularly for new changes, growing lesions, or other lesions of concern Come in for evaluation if you have skin lesions of concern.  Lung cancer screening: If you have a greater than 20 pack year history of tobacco use, then you may qualify for lung cancer screening with a chest CT scan.   Please call your insurance company to inquire about coverage for this test.  We currently don't have screenings for other cancers besides breast, cervical, colon, and lung cancers.  If you have a strong family history of cancer or have other cancer screening concerns, please let me know.    Bone health: Get at least 150 minutes of aerobic exercise weekly Get weight bearing exercise at least once weekly Bone density test:  A bone density test is an imaging test that uses a type of X-ray to measure the amount of calcium and other minerals in your bones. The test may be used to diagnose or screen you for a condition that causes weak or thin bones (osteoporosis), predict your risk for a broken bone (fracture), or determine how well your osteoporosis treatment is working. The bone density test is recommended for females 79 and older, or females or males <86 if certain risk factors such as thyroid disease, long term use of steroids such as for asthma or rheumatological issues, vitamin D deficiency, estrogen deficiency, family history of osteoporosis, self or family history of fragility fracture in first degree relative.  I reviewed your bone density scan showing osteopenia from 2020 in the chart record.    Heart health: Get at least 150 minutes of aerobic exercise weekly Limit alcohol It is important to maintain a healthy blood pressure and healthy cholesterol numbers  Heart disease screening: Screening for heart disease includes screening for blood  pressure, fasting lipids, glucose/diabetes screening, BMI height to weight ratio, reviewed of smoking status, physical activity, and diet.    Goals  include blood pressure 120/80 or less, maintaining a healthy lipid/cholesterol profile, preventing diabetes or keeping diabetes numbers under good control, not smoking or using tobacco products, exercising most days per week or at least 150 minutes per week of exercise, and eating healthy variety of fruits and vegetables, healthy oils, and avoiding unhealthy food choices like fried food, fast food, high sugar and high cholesterol foods.    Other tests may possibly include EKG test, CT coronary calcium score, echocardiogram, exercise treadmill stress test.   Saw cardiology late 2022, holter testing with mostly NSR, and no major concerns.    Medical care options: I recommend you continue to seek care here first for routine care.  We try really hard to have available appointments Monday through Friday daytime hours for sick visits, acute visits, and physicals.  Urgent care should be used for after hours and weekends for significant issues that cannot wait till the next day.  The emergency department should be used for significant potentially life-threatening emergencies.  The emergency department is expensive, can often have long wait times for less significant concerns, so try to utilize primary care, urgent care, or telemedicine when possible to avoid unnecessary trips to the emergency department.  Virtual visits and telemedicine have been introduced since the pandemic started in 2020, and can be convenient ways to receive medical care.  We offer virtual appointments as well to assist you in a variety of options to seek medical care.   Advanced Directives: I recommend you consider completing a [place] and Living Will.   These documents respect your wishes and help alleviate burdens on your loved ones if you were to become terminally ill or be in a position to need those documents enforced.    You can complete Advanced Directives yourself, have them notarized, then have copies made  for our office, for you and for anybody you feel should have them in safe keeping.  Or, you can have an attorney prepare these documents.   If you haven't updated your Last Will and Testament in a while, it may be worthwhile having an attorney prepare these documents together and save on some costs.       I reviewed recent labs she had done through [facility] on Mar 15, 2022.  These labs included negative SPEP, negative serology for hepatitis B and C, negative HIV test.  Several positive allergy tests were done.  Comprehensive metabolic panel was normal except for creatinine slightly elevated.  She can return at her convenience for fasting lipid panel   Separate significant issues discussed: Migraines-we discussed possible preventative and abortive therapies.  She has a high deductible plan so cannot afford Amiovig that was working well for her.  We will consider some options for therapy and call her back.    Preventative therapy: Aimovig was working but this is too expensive currently Other options include nortriptyline or amitriptyline, beta-blocker such as metoprolol, gabapentin.  You have prior not tolerated Topamax Since the Aimovig is cost prohibitive currently, it may be worth trying either nortriptyline, or considering something like sertraline in place of Wellbutrin as similar medicines have been shown to help menstrual migraines or hormonally induced migraines  Migraine abortive therapy: Initial options can include Imitrex or Amerge, Excedrin Migraine over-the-counter, or other over-the-counter analgesic of your choice A second initial option can  include Fioricet, or you can use Fioricet if the initial option above does not help after 2 to 3 hours Step 3 would be prednisone pulse dose If the migraine continues to be a problem without resolving then come in for treatment  There is a long list of abortive therapies on the market.  If you would like a list of  headache medication names to check good Rx or check with your insurance let me know.   Elevated creatinine-she is seeing nephrologist prior for this.  Updated labs today.  Asthma - doing fine on Symbicort once daily, albuterol as needed  Hypothyroid -compliant with medicine.  Labs today  Anemia -compliant with iron regularly, no heavy bleeding menstrually, labs today  Anxiety - Doing fine on Wellbutrin.     Hypothyroidism - compliant with Synthroid, labs today.  History of allergies, rash -has a history of skin reaction, hives, allergies.  She thinks dark ciders and alcohols make her itch.  Thinks blue and gray dye can give her problems, hives.  I reviewed some older allergy testing she had in 2019 showing allergies to grasses, weeds, trees, peanuts, soy beans, sesame, almond, cabbage, cantaloupe, watermelon, ginger, garlic.  We will check a corn allergy test today at her request.  History of celiac disease-diet controlled, diet has been gluten free since 2013  Right femoral neck fracture in the past, no prior surgery  She has history of stress fractures in both tibias in the past

## 2022-03-28 LAB — CBC WITH DIFFERENTIAL/PLATELET
Basophils Absolute: 0.1 10*3/uL (ref 0.0–0.2)
Basos: 1 %
EOS (ABSOLUTE): 0.1 10*3/uL (ref 0.0–0.4)
Eos: 2 %
Hematocrit: 42.1 % (ref 34.0–46.6)
Hemoglobin: 14.7 g/dL (ref 11.1–15.9)
Immature Grans (Abs): 0 10*3/uL (ref 0.0–0.1)
Immature Granulocytes: 0 %
Lymphocytes Absolute: 1.7 10*3/uL (ref 0.7–3.1)
Lymphs: 27 %
MCH: 32.5 pg (ref 26.6–33.0)
MCHC: 34.9 g/dL (ref 31.5–35.7)
MCV: 93 fL (ref 79–97)
Monocytes Absolute: 0.4 10*3/uL (ref 0.1–0.9)
Monocytes: 7 %
Neutrophils Absolute: 4 10*3/uL (ref 1.4–7.0)
Neutrophils: 63 %
Platelets: 212 10*3/uL (ref 150–450)
RBC: 4.53 x10E6/uL (ref 3.77–5.28)
RDW: 12.1 % (ref 11.7–15.4)
WBC: 6.3 10*3/uL (ref 3.4–10.8)

## 2022-03-28 LAB — SEDIMENTATION RATE: Sed Rate: 2 mm/hr (ref 0–32)

## 2022-03-28 LAB — RENAL FUNCTION PANEL
Albumin: 4.2 g/dL (ref 3.8–4.8)
BUN/Creatinine Ratio: 12 (ref 9–23)
BUN: 11 mg/dL (ref 6–24)
CO2: 21 mmol/L (ref 20–29)
Calcium: 9.6 mg/dL (ref 8.7–10.2)
Chloride: 101 mmol/L (ref 96–106)
Creatinine, Ser: 0.93 mg/dL (ref 0.57–1.00)
Glucose: 82 mg/dL (ref 70–99)
Phosphorus: 3.5 mg/dL (ref 3.0–4.3)
Potassium: 4.1 mmol/L (ref 3.5–5.2)
Sodium: 138 mmol/L (ref 134–144)
eGFR: 76 mL/min/{1.73_m2} (ref >59)

## 2022-03-28 LAB — TSH+FREE T4
Free T4: 1.29 ng/dL (ref 0.82–1.77)
TSH: 1.95 u[IU]/mL (ref 0.450–4.500)

## 2022-03-28 LAB — ALLERGEN, CORN F8: Allergen Corn, IgE: 0.19 kU/L — AB

## 2022-03-28 LAB — IRON: Iron: 139 ug/dL (ref 27–159)

## 2022-03-29 DIAGNOSIS — H16143 Punctate keratitis, bilateral: Secondary | ICD-10-CM | POA: Diagnosis not present

## 2022-03-29 DIAGNOSIS — H25811 Combined forms of age-related cataract, right eye: Secondary | ICD-10-CM | POA: Diagnosis not present

## 2022-03-30 LAB — SPECIMEN STATUS REPORT

## 2022-03-30 LAB — FERRITIN: Ferritin: 35 ng/mL (ref 15–150)

## 2022-04-02 ENCOUNTER — Other Ambulatory Visit: Payer: Self-pay | Admitting: Medical

## 2022-04-05 ENCOUNTER — Other Ambulatory Visit: Payer: Self-pay | Admitting: Medical

## 2022-04-05 MED ORDER — SYNTHROID 50 MCG PO TABS: 50.0000 ug | ORAL_TABLET | Freq: Every day | ORAL | 3 refills | Status: DC

## 2022-04-05 MED ORDER — ALBUTEROL SULFATE HFA 108 (90 BASE) MCG/ACT IN AERS: 2.0000 | INHALATION_SPRAY | Freq: Four times a day (QID) | RESPIRATORY_TRACT | 3 refills | Status: DC | PRN

## 2022-04-16 ENCOUNTER — Other Ambulatory Visit: Payer: Self-pay | Admitting: Medical

## 2022-04-19 ENCOUNTER — Other Ambulatory Visit: Payer: Self-pay

## 2022-04-19 ENCOUNTER — Other Ambulatory Visit: Payer: BC Managed Care – PPO

## 2022-04-19 DIAGNOSIS — Z0000 Encounter for general adult medical examination without abnormal findings: Secondary | ICD-10-CM

## 2022-04-19 DIAGNOSIS — Z13220 Encounter for screening for lipoid disorders: Secondary | ICD-10-CM | POA: Diagnosis not present

## 2022-04-20 LAB — LIPID PANEL
Chol/HDL Ratio: 2.9 ratio (ref 0.0–4.4)
Cholesterol, Total: 203 mg/dL — ABNORMAL HIGH (ref 100–199)
HDL: 71 mg/dL (ref >39)
LDL Chol Calc (NIH): 120 mg/dL — ABNORMAL HIGH (ref 0–99)
Triglycerides: 65 mg/dL (ref 0–149)
VLDL Cholesterol Cal: 12 mg/dL (ref 5–40)

## 2022-05-29 ENCOUNTER — Other Ambulatory Visit: Payer: Self-pay | Admitting: Medical

## 2022-06-16 DIAGNOSIS — D8940 Mast cell activation, unspecified: Secondary | ICD-10-CM | POA: Diagnosis not present

## 2022-06-16 DIAGNOSIS — L509 Urticaria, unspecified: Secondary | ICD-10-CM | POA: Diagnosis not present

## 2022-06-26 ENCOUNTER — Other Ambulatory Visit: Payer: Self-pay | Admitting: Medical

## 2022-06-27 NOTE — Telephone Encounter (Signed)
Refill on Valtrex last apt 03/24/22.

## 2022-07-06 ENCOUNTER — Encounter: Payer: Self-pay | Admitting: Internal Medicine

## 2022-07-12 ENCOUNTER — Telehealth: Payer: BC Managed Care – PPO | Admitting: Family Medicine

## 2022-07-12 ENCOUNTER — Encounter: Payer: Self-pay | Admitting: Family Medicine

## 2022-07-12 VITALS — Temp 98.1°F | Wt 152.0 lb

## 2022-07-12 DIAGNOSIS — Z9889 Other specified postprocedural states: Secondary | ICD-10-CM

## 2022-07-12 DIAGNOSIS — J0101 Acute recurrent maxillary sinusitis: Secondary | ICD-10-CM

## 2022-07-12 MED ORDER — AMOXICILLIN-POT CLAVULANATE 875-125 MG PO TABS: 1.0000 | ORAL_TABLET | Freq: Two times a day (BID) | ORAL | 0 refills | Status: DC

## 2022-07-12 NOTE — Progress Notes (Signed)
   Subjective:    Patient ID: Barbara Odonnell, female    DOB: 1976/02/09, 46 y.o.   MRN: [id number]  HPI Documentation for virtual audio and video telecommunications through [place] encounter: The patient was located at home. 2 patient identifiers used.  The provider was located in the office. The patient did consent to this visit and is aware of possible charges through their insurance for this visit. The other persons participating in this telemedicine service were none. Time spent on call was 5 minutes and in review of previous records >20 minutes total for counseling and coordination of care. This virtual service is not related to other E/M service within previous 7 days.  She complains of a 4-day history of difficulty with left-sided sinus pressure, upper tooth pain mainly on that side with sore throat and myalgias.  She has a previous history of deviated septum with nasal surgery.  When she has problems usually on that left side.  She is using Flonase nasal spray as well as nasal saline.  No fever or chills.  She does plan to see her ENT concerning this issue in the near future.  Review of Systems     Objective:   Physical Exam Alert and in no distress.  She does point to the left maxillary sinus area as to where the pain is.       Assessment & Plan:  Acute recurrent maxillary sinusitis - Plan: amoxicillin-clavulanate (AUGMENTIN) 875-125 MG tablet  History of nasal surgery She is going to follow-up with her ENT at [facility] where she had the original surgery done.

## 2022-07-28 ENCOUNTER — Encounter: Payer: Self-pay | Admitting: Family Medicine

## 2022-07-29 MED ORDER — FLUCONAZOLE 150 MG PO TABS: 150.0000 mg | ORAL_TABLET | Freq: Once | ORAL | 0 refills | Status: AC

## 2022-08-09 ENCOUNTER — Encounter: Payer: Self-pay | Admitting: Internal Medicine

## 2022-08-15 ENCOUNTER — Other Ambulatory Visit: Payer: Self-pay | Admitting: Medical

## 2022-08-15 MED ORDER — AIMOVIG 140 MG/ML ~~LOC~~ SOAJ
140.0000 mg | SUBCUTANEOUS | 2 refills | Status: DC
Start: 2022-08-15 — End: 2022-12-28

## 2022-08-17 ENCOUNTER — Telehealth: Payer: Self-pay | Admitting: Family Medicine

## 2022-08-17 NOTE — Telephone Encounter (Signed)
Error

## 2022-08-20 ENCOUNTER — Telehealth: Payer: Self-pay | Admitting: Medical

## 2022-08-20 NOTE — Telephone Encounter (Signed)
P.A. AIMOVIG,  I also called company & requested samples in the meantime

## 2022-08-23 ENCOUNTER — Other Ambulatory Visit: Payer: Self-pay | Admitting: Medical

## 2022-08-31 DIAGNOSIS — Z124 Encounter for screening for malignant neoplasm of cervix: Secondary | ICD-10-CM | POA: Diagnosis not present

## 2022-08-31 DIAGNOSIS — N959 Unspecified menopausal and perimenopausal disorder: Secondary | ICD-10-CM | POA: Diagnosis not present

## 2022-08-31 DIAGNOSIS — N760 Acute vaginitis: Secondary | ICD-10-CM | POA: Diagnosis not present

## 2022-08-31 DIAGNOSIS — L509 Urticaria, unspecified: Secondary | ICD-10-CM | POA: Diagnosis not present

## 2022-08-31 DIAGNOSIS — Z01419 Encounter for gynecological examination (general) (routine) without abnormal findings: Secondary | ICD-10-CM | POA: Diagnosis not present

## 2022-08-31 LAB — HM PAP SMEAR: HM Pap smear: NEGATIVE

## 2022-09-13 NOTE — Telephone Encounter (Signed)
P.A. approved til 08/19/23, sent mychart message

## 2022-09-23 ENCOUNTER — Other Ambulatory Visit: Payer: Self-pay | Admitting: Medical

## 2022-09-23 DIAGNOSIS — G43909 Migraine, unspecified, not intractable, without status migrainosus: Secondary | ICD-10-CM | POA: Diagnosis not present

## 2022-10-11 ENCOUNTER — Ambulatory Visit
Admission: RE | Admit: 2022-10-11 | Discharge: 2022-10-11 | Disposition: A | Discharge status: Home / Self Care | Payer: BC Managed Care – PPO | Source: Ambulatory Visit | Attending: Internal Medicine | Admitting: Internal Medicine

## 2022-10-11 ENCOUNTER — Other Ambulatory Visit (HOSPITAL_COMMUNITY): Admission: RE | Admit: 2022-10-11 | Payer: BC Managed Care – PPO | Source: Ambulatory Visit

## 2022-10-11 ENCOUNTER — Ambulatory Visit (INDEPENDENT_AMBULATORY_CARE_PROVIDER_SITE_OTHER): Payer: BC Managed Care – PPO

## 2022-10-11 VITALS — BP 130/90 | HR 81 | Temp 98.2°F | Resp 16

## 2022-10-11 DIAGNOSIS — N760 Acute vaginitis: Secondary | ICD-10-CM | POA: Diagnosis not present

## 2022-10-11 DIAGNOSIS — R5383 Other fatigue: Secondary | ICD-10-CM | POA: Diagnosis not present

## 2022-10-11 DIAGNOSIS — Z1152 Encounter for screening for COVID-19: Secondary | ICD-10-CM | POA: Diagnosis not present

## 2022-10-11 DIAGNOSIS — R0789 Other chest pain: Secondary | ICD-10-CM | POA: Insufficient documentation

## 2022-10-11 DIAGNOSIS — Z20828 Contact with and (suspected) exposure to other viral communicable diseases: Secondary | ICD-10-CM | POA: Insufficient documentation

## 2022-10-11 DIAGNOSIS — J45909 Unspecified asthma, uncomplicated: Secondary | ICD-10-CM | POA: Diagnosis not present

## 2022-10-11 LAB — POCT URINALYSIS DIP (MANUAL ENTRY)
Bilirubin, UA: NEGATIVE
Blood, UA: NEGATIVE
Glucose, UA: NEGATIVE mg/dL
Ketones, POC UA: NEGATIVE mg/dL
Leukocytes, UA: NEGATIVE
Nitrite, UA: NEGATIVE
Protein Ur, POC: NEGATIVE mg/dL
Spec Grav, UA: 1.015 (ref 1.010–1.025)
Urobilinogen, UA: 0.2 E.U./dL
pH, UA: 5.5 (ref 5.0–8.0)

## 2022-10-11 LAB — POCT URINE PREGNANCY: Preg Test, Ur: NEGATIVE

## 2022-10-11 NOTE — ED Provider Notes (Signed)
[facility]    CSN: [id number] Arrival date & time: 10/11/22  1552    HISTORY   Chief Complaint  Patient presents with   Chest Pain   Fatigue   Chills   Generalized Body Aches   Nasal Congestion   bladder discomfort    HPI Barbara Odonnell is a pleasant, 46 y.o. female who presents to urgent care today. Pt states 4 days ago she began having chills, fatigue, body aches, chest tightness and chest congestion.  Patient denies cough.  Patient is requesting a chest x-ray today. At home Covid test this morning was negative.  Patient states she is using her Symbicort inhaler, ProAir, taking Advil Tylenol and Mucinex as well.  Patient has normal vital signs on arrival today.  Patient states in the past week she has been around several people who have been sick, states that the person who cut her hair 6 days ago told her that he was recovering from the flu.  Patient also complains about bladder pressure and vaginal itching.  Patient states Diflucan does not work for her.  Patient states this began 4 days ago as well.  Patient states she has not tried anything to alleviate her GU symptoms.  The history is provided by the patient.   Past Medical History:  Diagnosis Date   Allergy    Anemia    IV iron in past, prior to 2018   Asthma    Celiac disease    +prior biopsy   GERD (gastroesophageal reflux disease)    H/O bone density study 12/21/2017   normal   Hip fracture ([facility]) 2008   right, Dr. Oneida Alar   History of stress test 2014   [facility]   Hypothyroidism    Migraine headache without aura    gets bad nausea.  does well on Naratriptan and Relpax.  Failed Imitrex, Maxalt   Stress fracture    severeral prior   Transfusion history    has had IV iron prior, Dr. Marin Olp prior management   Patient Active Problem List   Diagnosis Date Noted   Elevated serum creatinine 03/24/2022   Food allergy 03/24/2022   Dysuria 06/02/2021   Urine frequency 06/02/2021   Skin lesion  06/02/2021   Chronic nausea 06/02/2021   Gastroesophageal reflux disease 06/02/2021   Moderate persistent asthma 06/02/2021   Pruritus 06/02/2021   History of COVID-19 03/16/2021   Palpitation 03/16/2021   Nausea 03/16/2021   Hair loss 03/16/2021   Shakes 03/16/2021   History of hip fracture 09/15/2020   Encounter for health maintenance examination in adult 09/15/2020   Moderate persistent asthma without complication 02/72/5366   Need for influenza vaccination 09/15/2020   Need for Tdap vaccination 09/15/2020   Screening for lipid disorders 09/15/2020   Hives 09/15/2020   History of Epstein-Barr virus infection 01/08/2020   Chronic fatigue 10/23/2019   Raynaud's phenomenon without gangrene 09/24/2019   Pruritic condition 09/24/2019   Hx of colonic polyp 09/13/2019   Vaccine counseling 03/11/2019   History of colitis 03/11/2019   Allergic rhinitis due to pollen 02/04/2019   Family history of osteoporosis 11/28/2017   History of stress fracture 11/28/2017   GERD (gastroesophageal reflux disease) 04/08/2016   Hypothyroidism 10/08/2015   Chronic migraine without aura without status migrainosus, not intractable 10/08/2015   Celiac disease 10/08/2015   Vitamin D deficiency 10/08/2015   Iron deficiency anemia 10/08/2015   Past Surgical History:  Procedure Laterality Date   COLONOSCOPY  2015  [facility], advised to repeat q5 years; x 4 prior   ESOPHAGOGASTRODUODENOSCOPY     x5 prior as of 2016   [place]     left   NASAL SEPTUM SURGERY     POLYPECTOMY     sigmoid colon   SINOSCOPY     WISDOM TOOTH EXTRACTION     OB History   No obstetric history on file.    Home Medications    Prior to Admission medications   Medication Sig Start Date End Date Taking? Authorizing Provider  rizatriptan (MAXALT) 10 MG tablet Take 1 tablet (10 mg total) by mouth once as needed for migraine. May repeat in 2 hours if needed Patient not taking: Reported on 07/12/2022 03/25/22  03/26/23  Tysinger, Camelia Eng, PA-C  albuterol (VENTOLIN HFA) 108 (90 Base) MCG/ACT inhaler INHALE 2 PUFFS EVERY 6 HOURS AS NEEDED FOR WHEEZE OR SHORTNESS OF BREATH 09/26/22   Tysinger, Camelia Eng, PA-C  budesonide-formoterol (SYMBICORT) 160-4.5 MCG/ACT inhaler Inhale 2 puffs into the lungs 2 (two) times daily. in the morning and at bedtime. 03/25/22   Tysinger, Camelia Eng, PA-C  buPROPion (WELLBUTRIN XL) 150 MG 24 hr tablet TAKE 1 TABLET BY MOUTH EVERY DAY 05/30/22   Tysinger, Camelia Eng, PA-C  butalbital-acetaminophen-caffeine (FIORICET) [phone] MG tablet Take 1-2 tablets by mouth every 6 (six) hours as needed for headache. 03/25/22 03/25/23  Tysinger, Camelia Eng, PA-C  [employer] D3 25 MCG (1000 UT) capsule TAKE 1 CAPSULE (1,000 UNITS TOTAL) BY MOUTH DAILY. 02/26/21   Tysinger, Camelia Eng, PA-C  Erenumab-aooe (AIMOVIG) 140 MG/ML SOAJ Inject 140 mg into the skin every 30 (thirty) days. 08/15/22   Tysinger, Camelia Eng, PA-C  etonogestrel-ethinyl estradiol (NUVARING) 0.12-0.015 MG/24HR vaginal ring Place 1 each vaginally every 28 (twenty-eight) days. Insert vaginally and leave in place for 3 consecutive weeks, then remove for 1 week. 11/02/21   Tysinger, Camelia Eng, PA-C  FeAspGl-FeFum-B12-FA-C-Succ Ac (FERREX 28) MISC Take 1 tablet by mouth daily. 03/25/22   Tysinger, Camelia Eng, PA-C  Homeopathic Products (ZINC) LOZG Use as directed in the mouth or throat.    [provider]  hydroquinone 4 % cream  10/12/20   [provider]  naratriptan (AMERGE) 2.5 MG tablet TAKE 1 TABLET AS NEEDED FOR MIGRAINE, MAY REPEAT AFTER 4 HOURS (MAX 2 TABS IN 24HRS, 18 TABS/MONTH) 03/25/22   Tysinger, Camelia Eng, PA-C  ondansetron (ZOFRAN-ODT) 4 MG disintegrating tablet DISSOLVE 1 TABLET ON THE TONGUE EVERY 8 HOURS AS NEEDED FOR NAUSEA AND VOMITING 03/25/22   Tysinger, Camelia Eng, PA-C  SYNTHROID 50 MCG tablet Take 1 tablet (50 mcg total) by mouth daily. 04/05/22   Tysinger, Camelia Eng, PA-C  tretinoin (RETIN-A) 0.05 % cream SMARTSIG:1 Sparingly Topical  Daily 09/17/20   [provider]  triamcinolone (KENALOG) 0.1 % Apply topically. 01/17/21   [provider]  valACYclovir (VALTREX) 500 MG tablet TAKE 1 TABLET BY MOUTH EVERY DAY **MYLAN BRAND** 06/27/22   Tysinger, Camelia Eng, PA-C  vitamin C (ASCORBIC ACID) 500 MG tablet Take 500 mg by mouth daily.    [provider]    Family History Family History  Problem Relation Age of Onset   Hypertension Mother    Osteopenia Mother    Raynaud syndrome Mother    Celiac disease Mother    Cancer Father        asbestos related lung cancer   Heart disease Father 33       Stent   Pulmonary embolism Father  Celiac disease Brother    Celiac disease Brother    Stroke Maternal Grandmother    COPD Maternal Grandmother    Osteopenia Maternal Grandmother    Heart disease Maternal Grandfather    Stroke Maternal Grandfather    Heart disease Paternal Grandfather        died 26yo   Diabetes Neg Hx    Social History Social History   Tobacco Use   Smoking status: Never   Smokeless tobacco: Never  Vaping Use   Vaping Use: Never used  Substance Use Topics   Alcohol use: Yes    Alcohol/week: 1.0 standard drink of alcohol    Types: 1 Glasses of wine per week    Comment: rare   Drug use: No   Allergies   Sulfa antibiotics, Montelukast, Other, and Singulair [montelukast sodium]  Review of Systems Review of Systems  HENT:  Negative for congestion, ear pain, postnasal drip, rhinorrhea, sinus pressure, sinus pain, sneezing, sore throat, trouble swallowing and voice change.   Respiratory:  Positive for cough, chest tightness and shortness of breath.   Genitourinary:  Negative for decreased urine volume, difficulty urinating, dysuria, flank pain, frequency, genital sores, hematuria, menstrual problem, pelvic pain, urgency, vaginal bleeding, vaginal discharge and vaginal pain.   Pertinent findings revealed after performing a 14 point review of systems has been noted in the  history of present illness.  Physical Exam Triage Vital Signs ED Triage Vitals  Enc Vitals Group     BP 08/27/21 0827 (!) 147/82     Pulse Rate 08/27/21 0827 72     Resp 08/27/21 0827 18     Temp 08/27/21 0827 98.3 F (36.8 C)     Temp Source 08/27/21 0827 Oral     SpO2 08/27/21 0827 98 %     Weight --      Height --      Head Circumference --      Peak Flow --      Pain Score 08/27/21 0826 5     Pain Loc --      Pain Edu? --      Excl. in [place]? --   No data found.  Updated Vital Signs BP (!) 130/90 (BP Location: Right Arm)   Pulse 81   Temp 98.2 F (36.8 C) (Oral)   Resp 16   LMP 09/19/2022 (Approximate) Comment: nuvaring  SpO2 100%   Physical Exam Vitals and nursing note reviewed.  Constitutional:      General: She is not in acute distress.    Appearance: Normal appearance. She is not ill-appearing.  HENT:     Head: Normocephalic and atraumatic.     Salivary Glands: Right salivary gland is not diffusely enlarged or tender. Left salivary gland is not diffusely enlarged or tender.     Right Ear: Tympanic membrane, ear canal and external ear normal. No drainage. No middle ear effusion. There is no impacted cerumen. Tympanic membrane is not erythematous or bulging.     Left Ear: Tympanic membrane, ear canal and external ear normal. No drainage.  No middle ear effusion. There is no impacted cerumen. Tympanic membrane is not erythematous or bulging.     Nose: Nose normal. No nasal deformity, septal deviation, mucosal edema, congestion or rhinorrhea.     Right Turbinates: Not enlarged, swollen or pale.     Left Turbinates: Not enlarged, swollen or pale.     Right Sinus: No maxillary sinus tenderness or frontal sinus tenderness.  Left Sinus: No maxillary sinus tenderness or frontal sinus tenderness.     Mouth/Throat:     Lips: Pink. No lesions.     Mouth: Mucous membranes are moist. No oral lesions.     Pharynx: Oropharynx is clear. Uvula midline. No posterior  oropharyngeal erythema or uvula swelling.     Tonsils: No tonsillar exudate. 0 on the right. 0 on the left.  Eyes:     General: Lids are normal.        Right eye: No discharge.        Left eye: No discharge.     Extraocular Movements: Extraocular movements intact.     Conjunctiva/sclera: Conjunctivae normal.     Right eye: Right conjunctiva is not injected.     Left eye: Left conjunctiva is not injected.  Neck:     Trachea: Trachea and phonation normal.  Cardiovascular:     Rate and Rhythm: Normal rate and regular rhythm.     Pulses: Normal pulses.     Heart sounds: Normal heart sounds. No murmur heard.    No friction rub. No gallop.  Pulmonary:     Effort: Pulmonary effort is normal. No accessory muscle usage, prolonged expiration or respiratory distress.     Breath sounds: Normal breath sounds. No stridor, decreased air movement or transmitted upper airway sounds. No decreased breath sounds, wheezing, rhonchi or rales.  Chest:     Chest wall: No tenderness.  Abdominal:     General: Abdomen is flat. Bowel sounds are normal. There is no distension.     Palpations: Abdomen is soft.     Tenderness: There is no abdominal tenderness. There is no right CVA tenderness or left CVA tenderness.     Hernia: No hernia is present.  Genitourinary:    Comments: Patient politely declines pelvic exam today, patient provided a vaginal swab for testing. Musculoskeletal:        General: Normal range of motion.     Cervical back: Normal range of motion and neck supple. Normal range of motion.  Lymphadenopathy:     Cervical: No cervical adenopathy.  Skin:    General: Skin is warm and dry.     Findings: No erythema or rash.  Neurological:     General: No focal deficit present.     Mental Status: She is alert and oriented to person, place, and time.  Psychiatric:        Mood and Affect: Mood normal.        Behavior: Behavior normal.     Visual Acuity Right Eye Distance:   Left Eye Distance:    Bilateral Distance:    Right Eye Near:   Left Eye Near:    Bilateral Near:     UC Couse / Diagnostics / Procedures:     Radiology No results found.  Procedures Procedures (including critical care time) EKG  Pending results:  Labs Reviewed  CERVICOVAGINAL ANCILLARY ONLY - Abnormal; Notable for the following components:      Result Value   Candida Vaginitis Positive (*)    All other components within normal limits  RESP PANEL BY RT-PCR (FLU A&B, COVID) ARPGX2  POCT URINALYSIS DIP (MANUAL ENTRY)  POCT URINE PREGNANCY    Medications Ordered in UC: Medications - No data to display  UC Diagnoses / Final Clinical Impressions(s)   I have reviewed the triage vital signs and the nursing notes.  Pertinent labs & imaging results that were available during my care of the patient  were reviewed by me and considered in my medical decision making (see chart for details).    Final diagnoses:  Feeling of chest tightness  Uncomplicated asthma, unspecified asthma severity, unspecified whether persistent  Encounter for screening for COVID-19  Exposure to influenza  Acute vaginitis   Patient advised of chest x-ray findings.  Patient requesting influenza testing, unable to order influenza PCR without ordering COVID-19, patient agreeable to both tests.  Patient advised that due to the duration of her symptoms she would not qualify for antiviral treatment, patient verbalized understanding.  Patient's physical exam did not reveal any concern for chest congestion, cough, upper respiratory issues.  No intervention required at this time.  Patient advised to continue using her inhalers as prescribed.  Vaginal swab testing performed, will notify patient of results and treat as needed based on results.  Patient advised of normal urinalysis. Please see discharge instructions below for further details of plan of care as provided to patient. ED Prescriptions   None    PDMP not reviewed this  encounter.  Disposition Upon Discharge:  Condition: stable for discharge home Home: take medications as prescribed; routine discharge instructions as discussed; follow up as advised.  Patient presented with an acute illness with associated systemic symptoms and significant discomfort requiring urgent management. In my opinion, this is a condition that a prudent lay person (someone who possesses an average knowledge of health and medicine) may potentially expect to result in complications if not addressed urgently such as respiratory distress, impairment of bodily function or dysfunction of bodily organs.   Routine symptom specific, illness specific and/or disease specific instructions were discussed with the patient and/or caregiver at length.   As such, the patient has been evaluated and assessed, work-up was performed and treatment was provided in alignment with urgent care protocols and evidence based medicine.  Patient/parent/caregiver has been advised that the patient may require follow up for further testing and treatment if the symptoms continue in spite of treatment, as clinically indicated and appropriate.  If the patient was tested for COVID-19, Influenza and/or RSV, then the patient/parent/guardian was advised to isolate at home pending the results of his/her diagnostic coronavirus test and potentially longer if they're positive. I have also advised pt that if his/her COVID-19 test returns positive, it's recommended to self-isolate for at least 10 days after symptoms first appeared AND until fever-free for 24 hours without fever reducer AND other symptoms have improved or resolved. Discussed self-isolation recommendations as well as instructions for household member/close contacts as per the [facility] and  [facility], and also gave patient the [place] packet with this information.  Patient/parent/caregiver has been advised to return to the [facility] or PCP in 3-5 days if no better; to PCP or the Emergency  Department if new signs and symptoms develop, or if the current signs or symptoms continue to change or worsen for further workup, evaluation and treatment as clinically indicated and appropriate  The patient will follow up with their current PCP if and as advised. If the patient does not currently have a PCP we will assist them in obtaining one.   The patient may need specialty follow up if the symptoms continue, in spite of conservative treatment and management, for further workup, evaluation, consultation and treatment as clinically indicated and appropriate.  Patient/parent/caregiver verbalized understanding and agreement of plan as discussed.  All questions were addressed during visit.  Please see discharge instructions below for further details of plan.  Discharge Instructions:   Discharge Instructions  Per my personal read, your chest x-ray is normal.  I am still waiting on the radiology report.  The report we posted to your MyChart account once it has been received.  The results of your COVID-19 and influenza test will be posted to your MyChart later this evening, if you have a positive result you will be contacted by phone with further information.  The results of your vaginal swab test which will evaluate you for yeast and bacterial vaginosis along with testing for chlamydia gonorrhea and trichomoniasis will be posted to your MyChart account within the next 3 to 5 days.  If there are any positive findings, you will be contacted by phone and further recommendations will be provided for you.  Your urinalysis today did not reveal any concerns for urinary tract infection.  Thank you for visiting urgent care today.  Please do not hesitate to let us know if there is anything else we can do for you.  Please continue all of your current medications as they have been prescribed.      This office note has been dictated using Museum/gallery curator.  Unfortunately, this  method of dictation can sometimes lead to typographical or grammatical errors.  I apologize for your inconvenience in advance if this occurs.  Please do not hesitate to reach out to me if clarification is needed.      Lynden Oxford Scales, PA-C 10/13/22 2014

## 2022-10-11 NOTE — Discharge Instructions (Addendum)
Per my personal read, your chest x-ray is normal.  I am still waiting on the radiology report.  The report we posted to your MyChart account once it has been received.  The results of your COVID-19 and influenza test will be posted to your MyChart later this evening, if you have a positive result you will be contacted by phone with further information.  The results of your vaginal swab test which will evaluate you for yeast and bacterial vaginosis along with testing for chlamydia gonorrhea and trichomoniasis will be posted to your MyChart account within the next 3 to 5 days.  If there are any positive findings, you will be contacted by phone and further recommendations will be provided for you.  Your urinalysis today did not reveal any concerns for urinary tract infection.  Thank you for visiting urgent care today.  Please do not hesitate to let us know if there is anything else we can do for you.  Please continue all of your current medications as they have been prescribed.

## 2022-10-11 NOTE — ED Notes (Signed)
Pt in restroom

## 2022-10-11 NOTE — ED Triage Notes (Signed)
Pt states Saturday she began having chills, fatigue, body aches, chest tightness that began on Sunday, and congestion.   At home Covid test this morning was negative.  Home interventions: Symbicort, proair, Advil/tylenol, mucinex   The patient states she has been having bladder pressure and vaginal discomfort. She states diflucan does not work for her.   Started: Saturday

## 2022-10-12 ENCOUNTER — Ambulatory Visit (HOSPITAL_COMMUNITY): Payer: BC Managed Care – PPO

## 2022-10-12 ENCOUNTER — Telehealth (HOSPITAL_COMMUNITY): Payer: Self-pay | Admitting: Emergency Medicine

## 2022-10-12 LAB — RESP PANEL BY RT-PCR (FLU A&B, COVID) ARPGX2
Influenza A by PCR: NEGATIVE
Influenza B by PCR: NEGATIVE
SARS Coronavirus 2 by RT PCR: NEGATIVE

## 2022-10-12 LAB — CERVICOVAGINAL ANCILLARY ONLY
Bacterial Vaginitis (gardnerella): NEGATIVE
Candida Glabrata: NEGATIVE
Candida Vaginitis: POSITIVE — AB
Chlamydia: NEGATIVE
Comment: NEGATIVE
Comment: NEGATIVE
Comment: NEGATIVE
Comment: NEGATIVE
Comment: NEGATIVE
Comment: NORMAL
Neisseria Gonorrhea: NEGATIVE
Trichomonas: NEGATIVE

## 2022-10-12 MED ORDER — FLUCONAZOLE 150 MG PO TABS: 150.0000 mg | ORAL_TABLET | Freq: Once | ORAL | 0 refills | Status: AC

## 2022-10-13 ENCOUNTER — Telehealth (HOSPITAL_COMMUNITY): Payer: Self-pay | Admitting: Emergency Medicine

## 2022-10-13 ENCOUNTER — Other Ambulatory Visit: Payer: Self-pay | Admitting: Medical

## 2022-10-13 MED ORDER — CLOTRIMAZOLE 1 % VA CREA: 1.0000 | TOPICAL_CREAM | Freq: Every day | VAGINAL | 0 refills | Status: DC

## 2022-10-13 MED ORDER — TERCONAZOLE 80 MG VA SUPP: 80.0000 mg | Freq: Every day | VAGINAL | 0 refills | Status: DC

## 2022-10-13 NOTE — Telephone Encounter (Signed)
Patient advised.

## 2022-10-21 ENCOUNTER — Ambulatory Visit: Payer: BC Managed Care – PPO | Admitting: Medical

## 2022-10-21 ENCOUNTER — Encounter: Payer: Self-pay | Admitting: Medical

## 2022-10-21 VITALS — BP 122/88 | HR 74 | Temp 98.1°F | Resp 18 | Wt 158.0 lb

## 2022-10-21 DIAGNOSIS — B379 Candidiasis, unspecified: Secondary | ICD-10-CM

## 2022-10-21 DIAGNOSIS — J988 Other specified respiratory disorders: Secondary | ICD-10-CM

## 2022-10-21 DIAGNOSIS — J3489 Other specified disorders of nose and nasal sinuses: Secondary | ICD-10-CM

## 2022-10-21 LAB — POCT WET PREP (WET MOUNT)
Clue Cells Wet Prep Whiff POC: NEGATIVE
Trichomonas Wet Prep HPF POC: ABSENT

## 2022-10-21 LAB — POCT URINALYSIS DIP (PROADVANTAGE DEVICE)
Bilirubin, UA: NEGATIVE
Blood, UA: NEGATIVE
Glucose, UA: NEGATIVE mg/dL
Nitrite, UA: NEGATIVE
Protein Ur, POC: NEGATIVE mg/dL
Specific Gravity, Urine: 1.02
Urobilinogen, Ur: 0.2
pH, UA: 6 (ref 5.0–8.0)

## 2022-10-21 MED ORDER — AZITHROMYCIN 500 MG PO TABS: 500.0000 mg | ORAL_TABLET | Freq: Every day | ORAL | 0 refills | Status: AC

## 2022-10-21 MED ORDER — MICONAZOLE 3 200 MG VA SUPP: 200.0000 mg | Freq: Every day | VAGINAL | 0 refills | Status: DC

## 2022-10-21 NOTE — Progress Notes (Signed)
Subjective: Chief Complaint  Patient presents with   Sinus Problem    On going issues with sinuses since 10/08/2022. Was seen at Urgent Care on 10/11/2022. Today, patient states she still has fatigue, general congestion, and some sinus pressure   Vaginitis    Wants to check to make yeast infection has cleared.    Here for concerns  10/08/22 starting getting sick with fatigue, slept for 2 days straight, had sinus pressure, chest tightness. Went to urgent care 10/11/22, took it easy the rest of the week but still had lingering sinus pressure despite mucinex.  The massive fatigue improved.  Still has some fatigue.  Was using some flonase, saline nasal.  No recent steroids, no recent antibiotics.  Still has some congestion.   Is improved some though.   No sore throat, no ear pain.   No cough.  Has some dryness in nose.   At home has electric heat pump.      Has hx/o sinus surgery, seeing them again 10/2022.    Has concerns about yeast infection that started mid September.  Had had treatment, got better, but has had recurrent problems for last 3 months.   Has been on Terconazole suppositories, and Clotrimazole betamethasone, has also been on Diflucan as well.   Recent did Terconazole suppositories for 3 days a week and half ago, finished that.   Was also advised by gyn to use the clotrimazole betamethasone cream externally for 2 weeks, and has a half a week left.    No cream today.   Prior to this regimen, last regimen was first 2 weeks in November.    No dysuria, no blood in urine, no urine frequency, no urgency.   Gets some mild incontinence with exercise or if too much coffee.  No concern for STD.  Last antibiotics was mid September   When got sick again on 10/08/22, yeast symptoms came back.   Past Medical History:  Diagnosis Date   Allergy    Anemia    IV iron in past, prior to 2018   Asthma    Celiac disease    +prior biopsy   GERD (gastroesophageal reflux disease)    H/O bone density  study 12/21/2017   normal   Hip fracture ([place]) 2008   right, Dr. Oneida Alar   History of stress test 2014   [facility]   Hypothyroidism    Migraine headache without aura    gets bad nausea.  does well on Naratriptan and Relpax.  Failed Imitrex, Maxalt   Stress fracture    severeral prior   Transfusion history    has had IV iron prior, Dr. Marin Olp prior management   Current Outpatient Medications on File Prior to Visit  Medication Sig Dispense Refill   albuterol (VENTOLIN HFA) 108 (90 Base) MCG/ACT inhaler INHALE 2 PUFFS EVERY 6 HOURS AS NEEDED FOR WHEEZE OR SHORTNESS OF BREATH 8.5 each 0   budesonide-formoterol (SYMBICORT) 160-4.5 MCG/ACT inhaler Inhale 2 puffs into the lungs 2 (two) times daily. in the morning and at bedtime. 10.2 each 11   buPROPion (WELLBUTRIN XL) 150 MG 24 hr tablet TAKE 1 TABLET BY MOUTH EVERY DAY 90 tablet 1   butalbital-acetaminophen-caffeine (FIORICET) 50-325-40 MG tablet Take 1-2 tablets by mouth every 6 (six) hours as needed for headache. 20 tablet 2   clotrimazole (GYNE-LOTRIMIN) 1 % vaginal cream Place 1 Applicatorful vaginally at bedtime. 45 g 0   CVS D3 25 MCG (1000 UT) capsule TAKE 1 CAPSULE (1,000 UNITS  TOTAL) BY MOUTH DAILY. 100 capsule 3   Erenumab-aooe (AIMOVIG) 140 MG/ML SOAJ Inject 140 mg into the skin every 30 (thirty) days. 1.12 mL 2   etonogestrel-ethinyl estradiol (NUVARING) 0.12-0.015 MG/24HR vaginal ring Place 1 each vaginally every 28 (twenty-eight) days. Insert vaginally and leave in place for 3 consecutive weeks, then remove for 1 week. 1 each 0   FeAspGl-FeFum-B12-FA-C-Succ Ac (FERREX 28) MISC Take 1 tablet by mouth daily. 90 each 3   Homeopathic Products (ZINC) LOZG Use as directed in the mouth or throat.     hydroquinone 4 % cream      naratriptan (AMERGE) 2.5 MG tablet TAKE 1 TABLET AS NEEDED FOR MIGRAINE, MAY REPEAT AFTER 4 HOURS (MAX 2 TABS IN 24HRS, 18 TABS/MONTH) 18 tablet 3   ondansetron (ZOFRAN-ODT) 4 MG disintegrating tablet DISSOLVE  1 TABLET ON THE TONGUE EVERY 8 HOURS AS NEEDED FOR NAUSEA AND VOMITING 30 tablet 3   rizatriptan (MAXALT) 10 MG tablet Take 1 tablet (10 mg total) by mouth once as needed for migraine. May repeat in 2 hours if needed 30 tablet 2   SYNTHROID 50 MCG tablet Take 1 tablet (50 mcg total) by mouth daily. 90 tablet 3   terconazole (TERAZOL 3) 80 MG vaginal suppository Place 1 suppository (80 mg total) vaginally at bedtime. 3 suppository 0   tretinoin (RETIN-A) 0.05 % cream SMARTSIG:1 Sparingly Topical Daily     triamcinolone (KENALOG) 0.1 % Apply topically.     valACYclovir (VALTREX) 500 MG tablet TAKE 1 TABLET BY MOUTH EVERY DAY **MYLAN BRAND** 90 tablet 0   vitamin C (ASCORBIC ACID) 500 MG tablet Take 500 mg by mouth daily.     No current facility-administered medications on file prior to visit.   ROS as in subjective    Objective: BP 122/88   Pulse 74   Temp 98.1 F (36.7 C) (Oral)   Resp 18   Wt 158 lb (71.7 kg)   LMP 09/19/2022 (Approximate) Comment: nuvaring  SpO2 99% Comment: room air  BMI 24.20 kg/m   General appearence: alert, no distress, WD/WN,  HEENT: normocephalic, sclerae anicteric, TMs pearly, nares with mucoid discharge, + erythema, pharynx normal Neck: supple, no lymphadenopathy, no thyromegaly, no masses Lungs: CTA bilaterally, no wheezes, rhonchi, or rales Abdomen: +bs, soft, non tender, non distended, no masses, no hepatomegaly, no splenomegaly Pulses: 2+ symmetric, upper and lower extremities, normal cap refill GU declines    Assessment: Encounter Diagnoses  Name Primary?   Yeast infection Yes   Respiratory tract infection    Sinus pressure      Plan Recurrent yeast infection Recently not fully improved with multiple treatments, oral, intravaginal and topical for vaginitis.   Improving, but still some yeasts on swab today Gave additional suppository to use if symptoms not resolved in the next week Counseled on preventive measures  Respiratory tract  infection, sinus pressure Stop mucinex Try 3-5 days of sudafed Continue nasal saline but stop Flonase If not resolving within a week, then add Azathymine x 3 days   Barbara Odonnell was seen today for sinus problem and vaginitis.  Diagnoses and all orders for this visit:  Yeast infection -     POCT Urinalysis DIP (Proadvantage Device) -     POCT Wet Prep [facility])  Respiratory tract infection  Sinus pressure  Other orders -     miconazole (MICONAZOLE 3) 200 MG vaginal suppository; Place 1 suppository (200 mg total) vaginally at bedtime. X 3 days -  azithromycin (ZITHROMAX) 500 MG tablet; Take 1 tablet (500 mg total) by mouth daily for 3 days.    F/u prn

## 2022-10-26 DIAGNOSIS — R300 Dysuria: Secondary | ICD-10-CM | POA: Diagnosis not present

## 2022-10-26 DIAGNOSIS — N898 Other specified noninflammatory disorders of vagina: Secondary | ICD-10-CM | POA: Diagnosis not present

## 2022-10-26 DIAGNOSIS — R32 Unspecified urinary incontinence: Secondary | ICD-10-CM | POA: Diagnosis not present

## 2022-10-26 DIAGNOSIS — K900 Celiac disease: Secondary | ICD-10-CM | POA: Diagnosis not present

## 2022-10-28 ENCOUNTER — Other Ambulatory Visit: Payer: Self-pay | Admitting: Medical

## 2022-11-01 DIAGNOSIS — Z9889 Other specified postprocedural states: Secondary | ICD-10-CM | POA: Diagnosis not present

## 2022-11-01 DIAGNOSIS — J3489 Other specified disorders of nose and nasal sinuses: Secondary | ICD-10-CM | POA: Diagnosis not present

## 2022-11-11 ENCOUNTER — Encounter: Payer: Self-pay | Admitting: *Deleted

## 2022-11-13 ENCOUNTER — Other Ambulatory Visit: Payer: Self-pay | Admitting: Medical

## 2022-11-14 DIAGNOSIS — J209 Acute bronchitis, unspecified: Secondary | ICD-10-CM | POA: Diagnosis not present

## 2022-11-14 DIAGNOSIS — R062 Wheezing: Secondary | ICD-10-CM | POA: Diagnosis not present

## 2022-11-14 NOTE — Telephone Encounter (Signed)
Refill request last apt 10/21/22. 

## 2022-11-22 ENCOUNTER — Encounter: Payer: Self-pay | Admitting: Internal Medicine

## 2022-11-22 ENCOUNTER — Ambulatory Visit: Payer: BC Managed Care – PPO | Admitting: Internal Medicine

## 2022-11-22 ENCOUNTER — Other Ambulatory Visit: Payer: Self-pay

## 2022-11-22 VITALS — BP 131/92 | HR 75 | Temp 97.8°F | Ht 66.5 in | Wt 156.0 lb

## 2022-11-22 DIAGNOSIS — R5382 Chronic fatigue, unspecified: Secondary | ICD-10-CM | POA: Diagnosis not present

## 2022-11-22 NOTE — Progress Notes (Signed)
[facility] for Infectious Disease  Reason for Consult: Fatigue in the setting of prior EBV  Referring Provider: Chana Bode, PA-C   HPI:    Barbara Odonnell is a 47 y.o. female with PMHx as below who presents to the clinic for chronic fatigue in the setting of prior EBV.   She was referred as a new patient to [facility], but was seen by Dr Megan Salon in December 2020 for chronic fatigue in the setting of prior EBV infection.  I have reviewed these notes and recommendations.    She was also seen in August 2016 at the [facility] [place] [facility] and Dr Sherron Monday and Dr Veverly Fells.  I have reviewed those notes and recommendations.  She was seen by the above providers after extensive lab work in 2016 by Dr Koleen Nimrod in [place] showed findings consistent with a remote and inactive infection.  She was prescribed valacyclovir by Dr Koleen Nimrod but recommended to stop by both Dr Megan Salon and providers at [facility].  She presents today with symptoms related to fatigue that can be quite debilitating when they happen.  She does not note any precipitating factors other than maybe stress.  She is also concerned about the possible neurologic side effects and reports occasional tripping over her words or word finding difficulty during these episodes.  These episodes of fatigue can last for a few days or up to a week and then spontaneously resolve.  She will take Valtrex about every 2-3 days and reports some improvement with this.  When not fatigued, she is very active and functional.  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  She does reports a history of well controlled Celiac disease and Raynaud's phenomenon.      Patient's Medications  New Prescriptions   No medications on file  Previous Medications   ALBUTEROL (VENTOLIN HFA) 108 (90 BASE) MCG/ACT INHALER    INHALE 2 PUFFS EVERY 6 HOURS AS NEEDED FOR WHEEZE OR SHORTNESS OF BREATH    BUDESONIDE-FORMOTEROL (SYMBICORT) 160-4.5 MCG/ACT INHALER    Inhale 2 puffs into the lungs 2 (two) times daily. in the morning and at bedtime.   BUPROPION (WELLBUTRIN XL) 150 MG 24 HR TABLET    TAKE 1 TABLET BY MOUTH EVERY DAY   BUTALBITAL-ACETAMINOPHEN-CAFFEINE (FIORICET) 50-325-40 MG TABLET    Take 1-2 tablets by mouth every 6 (six) hours as needed for headache.   CLOTRIMAZOLE (GYNE-LOTRIMIN) 1 % VAGINAL CREAM    Place 1 Applicatorful vaginally at bedtime.   CVS D3 25 MCG (1000 UT) CAPSULE    TAKE 1 CAPSULE (1,000 UNITS TOTAL) BY MOUTH DAILY.   ERENUMAB-AOOE (AIMOVIG) 140 MG/ML SOAJ    Inject 140 mg into the skin every 30 (thirty) days.   ETONOGESTREL-ETHINYL ESTRADIOL (NUVARING) 0.12-0.015 MG/24HR VAGINAL RING    Place 1 each vaginally every 28 (twenty-eight) days. Insert vaginally and leave in place for 3 consecutive weeks, then remove for 1 week.   FAMOTIDINE (PEPCID) 20 MG TABLET    Take by mouth.   FEASPGL-FEFUM-B12-FA-C-SUCC AC (FERREX 28) MISC    Take 1 tablet by mouth daily.   FEXOFENADINE (ALLEGRA ODT) 30 MG DISINTEGRATING TABLET    Take by mouth.   HOMEOPATHIC PRODUCTS (ZINC) LOZG    Use as directed in the mouth or throat.   HYDROQUINONE 4 % CREAM       MICONAZOLE (MICONAZOLE 3) 200 MG VAGINAL SUPPOSITORY  Place 1 suppository (200 mg total) vaginally at bedtime. X 3 days   NARATRIPTAN (AMERGE) 2.5 MG TABLET    TAKE 1 TABLET AS NEEDED FOR MIGRAINE, MAY REPEAT AFTER 4 HOURS (MAX 2 TABS IN 24HRS, 18 TABS/MONTH)   ONDANSETRON (ZOFRAN-ODT) 4 MG DISINTEGRATING TABLET    DISSOLVE 1 TABLET ON THE TONGUE EVERY 8 HOURS AS NEEDED FOR NAUSEA AND VOMITING   RIZATRIPTAN (MAXALT) 10 MG TABLET    Take 1 tablet (10 mg total) by mouth once as needed for migraine. May repeat in 2 hours if needed   SYNTHROID 50 MCG TABLET    Take 1 tablet (50 mcg total) by mouth daily.   TERCONAZOLE (TERAZOL 3) 80 MG VAGINAL SUPPOSITORY    Place 1 suppository (80 mg total) vaginally at bedtime.   TRETINOIN (RETIN-A) 0.05  % CREAM    SMARTSIG:1 Sparingly Topical Daily   TRIAMCINOLONE (KENALOG) 0.1 %    Apply topically.   VALACYCLOVIR (VALTREX) 500 MG TABLET    TAKE 1 TABLET BY MOUTH EVERY DAY **MYLAN BRAND**   VITAMIN C (ASCORBIC ACID) 500 MG TABLET    Take 500 mg by mouth daily.  Modified Medications   No medications on file  Discontinued Medications   No medications on file      Past Medical History:  Diagnosis Date   Allergy    Anemia    IV iron in past, prior to 2018   Asthma    Celiac disease    +prior biopsy   GERD (gastroesophageal reflux disease)    H/O bone density study 12/21/2017   normal   Hip fracture ([place]) 2008   right, Dr. Oneida Alar   History of stress test 2014   [facility]   Hypothyroidism    Migraine headache without aura    gets bad nausea.  does well on Naratriptan and Relpax.  Failed Imitrex, Maxalt   Stress fracture    severeral prior   Transfusion history    has had IV iron prior, Dr. Marin Olp prior management    Social History   Tobacco Use   Smoking status: Never   Smokeless tobacco: Never  Vaping Use   Vaping Use: Never used  Substance Use Topics   Alcohol use: Yes    Alcohol/week: 1.0 standard drink of alcohol    Types: 1 Glasses of wine per week    Comment: rare   Drug use: No    Family History  Problem Relation Age of Onset   Hypertension Mother    Osteopenia Mother    Raynaud syndrome Mother    Celiac disease Mother    Cancer Father        asbestos related lung cancer   Heart disease Father 58       Stent   Pulmonary embolism Father    Celiac disease Brother    Celiac disease Brother    Stroke Maternal Grandmother    COPD Maternal Grandmother    Osteopenia Maternal Grandmother    Heart disease Maternal Grandfather    Stroke Maternal Grandfather    Heart disease Paternal Grandfather        died 71yo   Diabetes Neg Hx     Allergies  Allergen Reactions   Sulfa Antibiotics Anaphylaxis   Montelukast Other (See Comments)    Angry,  irritable, out of skin feeling   Other Hives    Dark alcohol   Singulair [Montelukast Sodium]     Angry, irritable, out of skin feeling  Review of Systems  All other systems reviewed and are negative.  Except as noted above in the HPI.   OBJECTIVE:    Vitals:   11/22/22 1352  BP: (!) 131/92  Pulse: 75  Temp: 97.8 F (36.6 C)  TempSrc: Temporal  SpO2: 100%  Weight: 156 lb (70.8 kg)  Height: 5' 6.5" (1.689 m)     Body mass index is 24.8 kg/m.  Physical Exam Constitutional:      Appearance: Normal appearance.     Comments: Very pleasant, engaging and conversational.  No acute concerns noted.   HENT:     Head: Normocephalic and atraumatic.  Eyes:     Extraocular Movements: Extraocular movements intact.     Conjunctiva/sclera: Conjunctivae normal.  Pulmonary:     Effort: Pulmonary effort is normal. No respiratory distress.  Musculoskeletal:        General: Normal range of motion.  Skin:    General: Skin is warm and dry.     Findings: No rash.  Neurological:     General: No focal deficit present.     Mental Status: She is alert and oriented to person, place, and time.  Psychiatric:        Mood and Affect: Mood normal.        Behavior: Behavior normal.      Labs and Microbiology:     Latest Ref Rng & Units 03/24/2022   12:29 PM 12/18/2020    2:02 PM 09/15/2020    9:37 AM  CBC  WBC 3.4 - 10.8 x10E3/uL 6.3  4.7  6.6   Hemoglobin 11.1 - 15.9 g/dL 78.4  69.6  29.5   Hematocrit 34.0 - 46.6 % 42.1  44.2  45.9   Platelets 150 - 450 x10E3/uL 212  222  247       Latest Ref Rng & Units 03/24/2022   12:29 PM 12/18/2020    2:02 PM 09/15/2020    9:37 AM  CMP  Glucose 70 - 99 mg/dL 82  85  80   BUN 6 - 24 mg/dL 11  7  11    Creatinine 0.57 - 1.00 mg/dL  2.84  1.32   Sodium 134 - 144 mmol/L 138  137  139   Potassium 3.5 - 5.2 mmol/L 4.1  4.5  4.3   Chloride 96 - 106 mmol/L 101  99  101   CO2 20 - 29 mmol/L 21  24  20    Calcium 8.7 - 10.2 mg/dL 9.6  9.5   9.8   Total Protein 6.0 - 8.5 g/dL  6.8  7.2   Total Bilirubin 0.0 - 1.2 mg/dL  0.3  0.4   Alkaline Phos 44 - 121 IU/L  51  44   AST 0 - 40 IU/L  24  25   ALT 0 - 32 IU/L  17  12        ASSESSMENT & PLAN:    Chronic fatigue Discussed with Darlina that I am not sure the cause of her episodes of fatigue, but that I do not think they are due to chronic EBV infection and that her labs had shown antibodies consistent with remote and inactive infection.  The claims made previously about EBV causing chronic fatigue has been debunked by [facility] medicine.  I also am not sure why she may feel improved on valacyclovir as this is not active against EBV other than possibly placebo effect.  At this time, I recommend she stay off valacyclovir and can  follow up as needed.     [facility] for Infectious [facility] 11/22/2022, 2:31 PM   I have personally spent greater than 21minutes involved in face-to-face and non-face-to-face activities for this patient on the day of the visit. Professional time spent includes the following activities: Preparing to see the patient (review of tests), Obtaining and/or reviewing separately obtained history (admission/discharge record), Performing a medically appropriate examination and/or evaluation , Ordering medications/tests/procedures, referring and communicating with other health care professionals, Documenting clinical information in the EMR, Independently interpreting results (not separately reported), Communicating results to the patient/family/caregiver, Counseling and educating the patient/family/caregiver and Care coordination (not separately reported).

## 2022-11-22 NOTE — Assessment & Plan Note (Signed)
Discussed with Barbara Odonnell that I am not sure the cause of her episodes of fatigue, but that I do not think they are due to chronic EBV infection and that her labs had shown antibodies consistent with remote and inactive infection.  The claims made previously about EBV causing chronic fatigue has been debunked by [facility] medicine.  I also am not sure why she may feel improved on valacyclovir as this is not active against EBV other than possibly placebo effect.  At this time, I recommend she stay off valacyclovir and can follow up as needed.

## 2022-11-23 ENCOUNTER — Other Ambulatory Visit: Payer: Self-pay | Admitting: Medical

## 2022-12-05 DIAGNOSIS — M8580 Other specified disorders of bone density and structure, unspecified site: Secondary | ICD-10-CM | POA: Diagnosis not present

## 2022-12-05 DIAGNOSIS — Z1231 Encounter for screening mammogram for malignant neoplasm of breast: Secondary | ICD-10-CM | POA: Diagnosis not present

## 2022-12-05 DIAGNOSIS — N951 Menopausal and female climacteric states: Secondary | ICD-10-CM | POA: Diagnosis not present

## 2022-12-05 DIAGNOSIS — N760 Acute vaginitis: Secondary | ICD-10-CM | POA: Diagnosis not present

## 2022-12-05 DIAGNOSIS — N762 Acute vulvitis: Secondary | ICD-10-CM | POA: Diagnosis not present

## 2022-12-06 DIAGNOSIS — Z9889 Other specified postprocedural states: Secondary | ICD-10-CM | POA: Diagnosis not present

## 2022-12-06 DIAGNOSIS — J3489 Other specified disorders of nose and nasal sinuses: Secondary | ICD-10-CM | POA: Diagnosis not present

## 2022-12-10 ENCOUNTER — Inpatient Hospital Stay (HOSPITAL_COMMUNITY): Admission: RE | Admit: 2022-12-10 | Payer: Self-pay | Source: Ambulatory Visit

## 2022-12-22 DIAGNOSIS — L298 Other pruritus: Secondary | ICD-10-CM | POA: Diagnosis not present

## 2022-12-22 DIAGNOSIS — L858 Other specified epidermal thickening: Secondary | ICD-10-CM | POA: Diagnosis not present

## 2022-12-22 DIAGNOSIS — L308 Other specified dermatitis: Secondary | ICD-10-CM | POA: Diagnosis not present

## 2022-12-27 ENCOUNTER — Other Ambulatory Visit: Payer: Self-pay | Admitting: Medical

## 2022-12-28 NOTE — Telephone Encounter (Signed)
Refill request last apt 10/21/22.

## 2023-01-06 ENCOUNTER — Other Ambulatory Visit: Payer: Self-pay | Admitting: Medical

## 2023-01-06 NOTE — Telephone Encounter (Signed)
done

## 2023-01-06 NOTE — Telephone Encounter (Signed)
Refil rquest last apt 10/21/22.

## 2023-01-30 ENCOUNTER — Other Ambulatory Visit: Payer: Self-pay | Admitting: Medical

## 2023-02-06 ENCOUNTER — Other Ambulatory Visit: Payer: Self-pay | Admitting: Medical

## 2023-02-20 DIAGNOSIS — M8580 Other specified disorders of bone density and structure, unspecified site: Secondary | ICD-10-CM

## 2023-02-20 DIAGNOSIS — Z79899 Other long term (current) drug therapy: Secondary | ICD-10-CM

## 2023-02-20 DIAGNOSIS — Z8262 Family history of osteoporosis: Secondary | ICD-10-CM

## 2023-02-20 DIAGNOSIS — E559 Vitamin D deficiency, unspecified: Secondary | ICD-10-CM

## 2023-03-14 ENCOUNTER — Other Ambulatory Visit: Payer: Self-pay | Admitting: Medical

## 2023-03-14 ENCOUNTER — Ambulatory Visit: Payer: BC Managed Care – PPO | Admitting: Medical

## 2023-03-14 VITALS — BP 110/70 | HR 90 | Wt 156.0 lb

## 2023-03-14 DIAGNOSIS — J4540 Moderate persistent asthma, uncomplicated: Secondary | ICD-10-CM | POA: Diagnosis not present

## 2023-03-14 DIAGNOSIS — R5382 Chronic fatigue, unspecified: Secondary | ICD-10-CM

## 2023-03-14 DIAGNOSIS — E039 Hypothyroidism, unspecified: Secondary | ICD-10-CM | POA: Diagnosis not present

## 2023-03-14 MED ORDER — RIZATRIPTAN BENZOATE 10 MG PO TABS: 10.0000 mg | ORAL_TABLET | Freq: Once | ORAL | 1 refills | Status: DC | PRN

## 2023-03-14 MED ORDER — TIZANIDINE HCL 4 MG PO TABS: 4.0000 mg | ORAL_TABLET | Freq: Two times a day (BID) | ORAL | 0 refills | Status: DC | PRN

## 2023-03-14 MED ORDER — UBRELVY 100 MG PO TABS: 1.0000 | ORAL_TABLET | Freq: Every day | ORAL | 1 refills | Status: DC | PRN

## 2023-03-14 MED ORDER — NARATRIPTAN HCL 2.5 MG PO TABS: ORAL_TABLET | ORAL | 1 refills | Status: DC

## 2023-03-14 NOTE — Progress Notes (Signed)
Subjective:  Barbara Odonnell is a 47 y.o. female who presents for Chief Complaint  Patient presents with   Medical Management of Chronic Issues    Medical management for migraines . Having 2-3 times a week. Recently had a migraine for 18 days.      Here for follow-up of migraines and medication recheck.  She on average has been getting about 10 migraines per month.  Overall since starting Aimiovig in the past year or so, this has been helpful in reducing the overall migraine frequency.  Sometimes her migraines are more tension related upper back and neck.  She uses Flexeril 5 mg, but half tablet as needed for this.  That seems to help.  Prior had tried Robaxin but that did not help as well  She gets menstrual migraines at times.  An intense exercise can also trigger a migraine.  Recently she had about 18 days of persistent migraine out of the blue.  She ended up going to urgent care while she was out of town ended up having to use dose of Toradol shot and prednisone.  Amerge works fairly well she catches a migraine early.  Otherwise she uses Maxalt  Fioricet does not seem to help that well anymore.  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  She always gets upper back tension when she is working prolonged hours in front of the camera and bright light.  She continues to teach spin class weekly  She recently got some Blue light glasses to use when doing virtual consults  No other aggravating or relieving factors.    No other c/o.  Past Medical History:  Diagnosis Date   Allergy    Anemia    IV iron in past, prior to 2018   Asthma    Celiac disease    +prior biopsy   GERD (gastroesophageal reflux disease)    H/O bone density study 12/21/2017   normal   Hip fracture ([facility]) 2008   right, Dr. Darrick Penna   History of stress test 2014   [facility]   Hypothyroidism    Migraine headache without aura    gets bad  nausea.  does well on Naratriptan and Relpax.  Failed Imitrex, Maxalt   Stress fracture    severeral prior   Transfusion history    has had IV iron prior, Dr. Myna Hidalgo prior management   Current Outpatient Medications on File Prior to Visit  Medication Sig Dispense Refill   albuterol (VENTOLIN HFA) 108 (90 Base) MCG/ACT inhaler INHALE 2 PUFFS BY MOUTH INTO THE LUNGS EVERY 6 HOURS AS NEEDED FOR WHEEZE OR SHORTNESS OF BREATH 8.5 each 0   budesonide-formoterol (SYMBICORT) 160-4.5 MCG/ACT inhaler Inhale 2 puffs into the lungs 2 (two) times daily. in the morning and at bedtime. 10.2 each 11   buPROPion (WELLBUTRIN XL) 150 MG 24 hr tablet TAKE 1 TABLET BY MOUTH EVERY DAY 90 tablet 0   butalbital-acetaminophen-caffeine (FIORICET) 50-325-40 MG tablet Take 1-2 tablets by mouth every 6 (six) hours as needed for headache. 20 tablet 2   clotrimazole (GYNE-LOTRIMIN) 1 % vaginal cream Place 1 Applicatorful vaginally at bedtime. 45 g 0   CVS D3 25 MCG (1000 UT) capsule TAKE 1 CAPSULE (1,000 UNITS TOTAL) BY MOUTH DAILY. 100 capsule 3   Erenumab-aooe (AIMOVIG) 140 MG/ML SOAJ INJECT 140 MG INTO THE SKIN EVERY 30 DAYS 1 mL 2   etonogestrel-ethinyl  estradiol (NUVARING) 0.12-0.015 MG/24HR vaginal ring Place 1 each vaginally every 28 (twenty-eight) days. Insert vaginally and leave in place for 3 consecutive weeks, then remove for 1 week. 1 each 0   famotidine (PEPCID) 20 MG tablet Take by mouth.     FeAspGl-FeFum-B12-FA-C-Succ Ac (FERREX 28) MISC Take 1 tablet by mouth daily. 90 each 3   fexofenadine (ALLEGRA ODT) 30 MG disintegrating tablet Take by mouth.     Homeopathic Products (ZINC) LOZG Use as directed in the mouth or throat.     hydroquinone 4 % cream      ondansetron (ZOFRAN-ODT) 4 MG disintegrating tablet DISSOLVE 1 TABLET ON THE TONGUE EVERY 8 HOURS AS NEEDED FOR NAUSEA AND VOMITING 30 tablet 3   SYNTHROID 50 MCG tablet Take 1 tablet (50 mcg total) by mouth daily. 90 tablet 3   tretinoin (RETIN-A) 0.05 %  cream SMARTSIG:1 Sparingly Topical Daily     triamcinolone (KENALOG) 0.1 % Apply topically.     vitamin C (ASCORBIC ACID) 500 MG tablet Take 500 mg by mouth daily.     miconazole (MICONAZOLE 3) 200 MG vaginal suppository Place 1 suppository (200 mg total) vaginally at bedtime. X 3 days (Patient not taking: Reported on 03/14/2023) 9 suppository 0   terconazole (TERAZOL 3) 80 MG vaginal suppository Place 1 suppository (80 mg total) vaginally at bedtime. (Patient not taking: Reported on 03/14/2023) 3 suppository 0   valACYclovir (VALTREX) 500 MG tablet TAKE 1 TABLET BY MOUTH EVERY DAY **MYLAN BRAND** (Patient not taking: Reported on 03/14/2023) 90 tablet 0   No current facility-administered medications on file prior to visit.     The following portions of the patient's history were reviewed and updated as appropriate: allergies, current medications, past family history, past medical history, past social history, past surgical history and problem list.  ROS Otherwise as in subjective above  Objective: BP 110/70   Pulse 90   Wt 156 lb (70.8 kg)   BMI 24.80 kg/m   General appearance: alert, no distress, well developed, well nourished    Assessment: Encounter Diagnoses  Name Primary?   Moderate persistent asthma, unspecified whether complicated Yes   Hypothyroidism, unspecified type    Chronic fatigue      Plan: We discussed her migraine history, prior treatments, what has worked and what has not worked.  For now she will continue monthly Aimiovig for prevention.   Consider switch to [facility] if not improving.  She has a repertoire of medicine she can use for acute migraine for abortive therapy including Amerge for early onset, Maxalt as a backup.  She will use tizanidine muscle laxer for more tension related migraines.  However we will hold off on Flexeril for now since it is more sedating.  We will hold off on Fioricet as well since it has not worked as well in recent months.  I gave  her samples of Ubrelvy to try as an option for abortive therapy.  Discussed risk and benefits of medication.  We discussed trying to do some massage therapy regularly, consider lap swimming and other nonpharmacological methods of helping with migraines.  Hypothyroidism-continue current medication  Inda was seen today for medical management of chronic issues.  Diagnoses and all orders for this visit:  Moderate persistent asthma, unspecified whether complicated  Hypothyroidism, unspecified type  Chronic fatigue  Other orders -     tiZANidine (ZANAFLEX) 4 MG tablet; Take 1 tablet (4 mg total) by mouth 2 (two) times daily as needed for muscle spasms. -  naratriptan (AMERGE) 2.5 MG tablet; Take one (1) tablet at onset of headache; if returns or does not resolve, may repeat after 4 hours; do not exceed five (5) mg in 24 hours. -     rizatriptan (MAXALT) 10 MG tablet; Take 1 tablet (10 mg total) by mouth once as needed for migraine. May repeat in 2 hours if needed -     Ubrogepant (UBRELVY) 100 MG TABS; Take 1 tablet (100 mg total) by mouth daily as needed.  Spent > 30 minutes face to face with patient in discussion of symptoms, evaluation, plan and recommendations.   Follow up: call report in a few weeks

## 2023-03-14 NOTE — Patient Instructions (Signed)
Sharen Hint, [facility] [phone]

## 2023-03-15 ENCOUNTER — Other Ambulatory Visit: Payer: Self-pay | Admitting: *Deleted

## 2023-03-15 ENCOUNTER — Other Ambulatory Visit: Payer: Self-pay | Admitting: Medical

## 2023-03-15 DIAGNOSIS — G43709 Chronic migraine without aura, not intractable, without status migrainosus: Secondary | ICD-10-CM

## 2023-03-15 MED ORDER — RIZATRIPTAN BENZOATE 10 MG PO TABS: 10.0000 mg | ORAL_TABLET | Freq: Once | ORAL | 2 refills | Status: DC | PRN

## 2023-03-15 NOTE — Telephone Encounter (Signed)
Alternative requested

## 2023-03-20 ENCOUNTER — Other Ambulatory Visit: Payer: Self-pay | Admitting: Medical

## 2023-03-20 MED ORDER — CYCLOBENZAPRINE HCL 5 MG PO TABS: 5.0000 mg | ORAL_TABLET | Freq: Every day | ORAL | 0 refills | Status: DC

## 2023-03-20 MED ORDER — NURTEC 75 MG PO TBDP: 1.0000 | ORAL_TABLET | Freq: Every day | ORAL | 0 refills | Status: DC | PRN

## 2023-03-26 ENCOUNTER — Other Ambulatory Visit: Payer: Self-pay | Admitting: Medical

## 2023-03-28 NOTE — Telephone Encounter (Signed)
This was already refilled on 5/15

## 2023-03-31 ENCOUNTER — Telehealth: Payer: Self-pay | Admitting: Medical

## 2023-03-31 NOTE — Telephone Encounter (Signed)
PA Case ID #: [id number] Barbara Odonnell 100MG  tablets completed waiting response  Form [employer] [place] Commercial Electronic Request Form

## 2023-03-31 NOTE — Telephone Encounter (Signed)
[facility]  PA Case ID #: [id number] Rx #: [id number] Need Help? Call us at [phone] Status sent iconSent to Plan today Drug Ubrelvy 100MG  tablets ePA cloud logo Form [employer] [place] Commercial Electronic Request Form

## 2023-04-03 ENCOUNTER — Other Ambulatory Visit: Payer: Self-pay | Admitting: Medical

## 2023-04-05 NOTE — Telephone Encounter (Signed)
Please provide patient with update on her PA

## 2023-04-05 NOTE — Telephone Encounter (Signed)
P.A. now approved

## 2023-04-05 NOTE — Telephone Encounter (Signed)
PA Case ID #: [id number] Rx #: [id number] Approved. Authorization Expiration Date: 06/23/2023 Drug Bernita Raisin 100MG  tablets ePA cloud logo Form [employer] [place] Commercial Electronic Request Form Sent mychart message

## 2023-04-13 NOTE — Telephone Encounter (Signed)
Done approved

## 2023-05-01 MED ORDER — SYNTHROID 50 MCG PO TABS: 50.0000 ug | ORAL_TABLET | Freq: Every day | ORAL | 1 refills | Status: DC

## 2023-05-02 ENCOUNTER — Other Ambulatory Visit: Payer: Self-pay | Admitting: Medical

## 2023-05-12 ENCOUNTER — Ambulatory Visit
Admission: RE | Admit: 2023-05-12 | Discharge: 2023-05-12 | Disposition: A | Discharge status: Home / Self Care | Payer: BC Managed Care – PPO | Source: Ambulatory Visit | Attending: Medical | Admitting: Medical

## 2023-05-12 DIAGNOSIS — M8588 Other specified disorders of bone density and structure, other site: Secondary | ICD-10-CM | POA: Diagnosis not present

## 2023-05-12 DIAGNOSIS — E559 Vitamin D deficiency, unspecified: Secondary | ICD-10-CM

## 2023-05-12 DIAGNOSIS — Z8262 Family history of osteoporosis: Secondary | ICD-10-CM

## 2023-05-12 DIAGNOSIS — Z79899 Other long term (current) drug therapy: Secondary | ICD-10-CM

## 2023-05-12 DIAGNOSIS — N958 Other specified menopausal and perimenopausal disorders: Secondary | ICD-10-CM | POA: Diagnosis not present

## 2023-05-12 DIAGNOSIS — M8580 Other specified disorders of bone density and structure, unspecified site: Secondary | ICD-10-CM

## 2023-05-16 NOTE — Progress Notes (Signed)
 Results sent through MyChart

## 2023-06-04 ENCOUNTER — Other Ambulatory Visit: Payer: Self-pay | Admitting: Medical

## 2023-06-05 ENCOUNTER — Other Ambulatory Visit: Payer: Self-pay | Admitting: Medical

## 2023-06-13 DIAGNOSIS — L299 Pruritus, unspecified: Secondary | ICD-10-CM | POA: Diagnosis not present

## 2023-06-13 DIAGNOSIS — T781XXD Other adverse food reactions, not elsewhere classified, subsequent encounter: Secondary | ICD-10-CM | POA: Diagnosis not present

## 2023-06-13 DIAGNOSIS — T781XXA Other adverse food reactions, not elsewhere classified, initial encounter: Secondary | ICD-10-CM | POA: Diagnosis not present

## 2023-06-16 ENCOUNTER — Other Ambulatory Visit: Payer: BC Managed Care – PPO

## 2023-06-20 DIAGNOSIS — I7300 Raynaud's syndrome without gangrene: Secondary | ICD-10-CM | POA: Diagnosis not present

## 2023-06-20 DIAGNOSIS — R5382 Chronic fatigue, unspecified: Secondary | ICD-10-CM | POA: Diagnosis not present

## 2023-06-20 DIAGNOSIS — M85852 Other specified disorders of bone density and structure, left thigh: Secondary | ICD-10-CM | POA: Diagnosis not present

## 2023-06-24 ENCOUNTER — Telehealth: Payer: Self-pay | Admitting: Medical

## 2023-06-24 NOTE — Telephone Encounter (Signed)
KeyCamillia Herter) PA Case ID #: [id number] Need Help? Call us at [phone] Status sent iconSent to Plan today Drug Bernita Raisin 100MG  tablets ePA cloud logo Form [employer] [place] Commercial

## 2023-06-26 ENCOUNTER — Encounter: Payer: Self-pay | Admitting: Neurology

## 2023-06-26 ENCOUNTER — Ambulatory Visit: Payer: BC Managed Care – PPO | Admitting: Neurology

## 2023-06-26 VITALS — BP 103/72 | HR 79 | Ht 67.0 in | Wt 150.0 lb

## 2023-06-26 DIAGNOSIS — G43711 Chronic migraine without aura, intractable, with status migrainosus: Secondary | ICD-10-CM | POA: Diagnosis not present

## 2023-06-26 NOTE — Progress Notes (Unsigned)
[facility]    Provider:  Dr Lucia Gaskins Requesting Provider: Aleen Campi Kermit Balo, PA-C Primary Care Provider:  Jac Canavan, PA-C  CC:  Migraine  HPI:  Barbara Odonnell is a 47 y.o. female here as requested by Jac Canavan, PA-C for migraines. has Hypothyroidism; Chronic migraine without aura without status migrainosus, not intractable; Celiac disease; Vitamin D deficiency; Iron deficiency anemia; GERD (gastroesophageal reflux disease); Family history of osteoporosis; History of stress fracture; Allergic rhinitis due to pollen; Vaccine counseling; History of colitis; Hx of colonic polyp; Raynaud's phenomenon without gangrene; Pruritic condition; Chronic fatigue; History of Epstein-Barr virus infection; History of hip fracture; Encounter for health maintenance examination in adult; Moderate persistent asthma without complication; Need for influenza vaccination; Need for Tdap vaccination; Screening for lipid disorders; Hives; History of COVID-19; Palpitation; Nausea; Hair loss; Shakes; Dysuria; Urine frequency; Skin lesion; Chronic nausea; Gastroesophageal reflux disease; Moderate persistent asthma; Pruritus; Elevated serum creatinine; Food allergy; and Chronic migraine without aura, with intractable migraine, so stated, with status migrainosus on their problem list. 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   He had brain imaging about 10 years ago and it was normal by report. Migraines started in 3s, mother and aunt get migraines. Triggers are lack of sleep, neck pain, high-intensity exercise, sleep, stress, hormones. 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 She feels perimenopause has made her migraines much much worse in feq and severity and more resistant to acute management. On Aimovig. Aimvig helps with the high intensity the most.  Not significantly  helping. For the last year, Having daily headaches, at least 20 mod to severe migraine days, only treats if the migraine goes over 3 days. Headaches last 24-72 hours and has lasted more 72 hours. Has neck pain. Pulsating/pounding/throbbing, can be severe 10/10, can start unilaterally or the really bad ones are in the neck and holocephalic, extreme nausea, can't move, photo/phonophobia. Heat and ice help.    Reviewed notes, labs and imaging from outside physicians, which showed:  From a thorough review of records, medications tried that can be used in migraine management include Aimovig, propranolol contraindicated due to asthma, Wellbutrin, Fioricet, Flexeril, Robaxin, Solu-Medrol injections, naratriptan(fav for mild to moderate), Zofran, prednisone tablets, sumatriptan, Maxalt(works on , Nurtec, tizanidine, Ubrelvy,Topiramate(side effects), cymbalta, effexor, can't take BP meds due to hypotension, amitriptyline/nortriptyline, zoloft, prozac,   Review of Systems: Patient complains of symptoms per HPI as well as the following symptoms none. Pertinent negatives and positives per HPI. All others negative.   Social History   Socioeconomic History   Marital status: Single    Spouse name: Not on file   Number of children: Not on file   Years of education: Not on file   Highest education level: Not on file  Occupational History   Not on file  Tobacco Use   Smoking status: Never   Smokeless tobacco: Never  Vaping Use   Vaping status: Never Used  Substance and Sexual Activity   Alcohol use: Yes    Alcohol/week: 1.0 standard drink of alcohol    Types: 1 Glasses of wine per week    Comment: rare   Drug use: No   Sexual activity: Not on file  Other Topics Concern   Not on file  Social History Narrative   Lives alone, 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   Prior worked as professor at [employer].  Exercise:-  6-7 days per week, teaches spin at [employer], swam competitively in college, marathon runner.       [facility]:   Originally from [place]. Has also lived in [place], [place], & [place].  Currently working in counseling. No pets currently. No bird exposure. No mold or hot tub exposure. Currently has carpet in the bedroom. No draperies. No exposure to asbestos.      Social Determinants of Health   Financial Resource Strain: Not on file  Food Insecurity: Not on file  Transportation Needs: Not on file  Physical Activity: Not on file  Stress: Not on file  Social Connections: Not on file  Intimate Partner Violence: Not on file    Family History  Problem Relation Age of Onset   Hypertension Mother    Osteopenia Mother    Raynaud syndrome Mother    Celiac disease Mother    Cancer Father        asbestos related lung cancer   Heart disease Father 30       Stent   Pulmonary embolism Father    Celiac disease Brother    Celiac disease Brother    Stroke Maternal Grandmother    COPD Maternal Grandmother    Osteopenia Maternal Grandmother    Heart disease Maternal Grandfather    Stroke Maternal Grandfather    Heart disease Paternal Grandfather        died 43yo   Diabetes Neg Hx     Past Medical History:  Diagnosis Date   Allergy    Anemia    IV iron in past, prior to 2018   Asthma    Celiac disease    +prior biopsy   GERD (gastroesophageal reflux disease)    H/O bone density study 12/21/2017   normal   Hip fracture (HCC) 2008   right, Dr. Darrick Penna   History of stress test 2014   [facility]   Hypothyroidism    Migraine headache without aura    gets bad nausea.  does well on Naratriptan and Relpax.  Failed Imitrex, Maxalt   Stress fracture    severeral prior   Transfusion history    has had IV iron prior, Dr. Myna Hidalgo prior management    Patient Active Problem List   Diagnosis Date Noted   Chronic migraine without aura, with intractable migraine, so stated, with status  migrainosus 06/26/2023   Elevated serum creatinine 03/24/2022   Food allergy 03/24/2022   Dysuria 06/02/2021   Urine frequency 06/02/2021   Skin lesion 06/02/2021   Chronic nausea 06/02/2021   Gastroesophageal reflux disease 06/02/2021   Moderate persistent asthma 06/02/2021   Pruritus 06/02/2021   History of COVID-19 03/16/2021   Palpitation 03/16/2021   Nausea 03/16/2021   Hair loss 03/16/2021   Shakes 03/16/2021   History of hip fracture 09/15/2020   Encounter for health maintenance examination in adult 09/15/2020   Moderate persistent asthma without complication 09/15/2020   Need for influenza vaccination 09/15/2020   Need for Tdap vaccination 09/15/2020   Screening for lipid disorders 09/15/2020   Hives 09/15/2020   History of Epstein-Barr virus infection 01/08/2020   Chronic fatigue 10/23/2019   Raynaud's phenomenon without gangrene 09/24/2019   Pruritic condition 09/24/2019   Hx of colonic polyp 09/13/2019   Vaccine counseling 03/11/2019   History of colitis 03/11/2019   Allergic rhinitis due to pollen 02/04/2019   Family history of osteoporosis 11/28/2017  History of stress fracture 11/28/2017   GERD (gastroesophageal reflux disease) 04/08/2016   Hypothyroidism 10/08/2015   Chronic migraine without aura without status migrainosus, not intractable 10/08/2015   Celiac disease 10/08/2015   Vitamin D deficiency 10/08/2015   Iron deficiency anemia 10/08/2015    Past Surgical History:  Procedure Laterality Date   COLONOSCOPY  2015   [facility], advised to repeat q5 years; x 4 prior   ESOPHAGOGASTRODUODENOSCOPY     x5 prior as of 2016   INGUINAL HERNIA REPAIR     left   NASAL SEPTUM SURGERY     POLYPECTOMY     sigmoid colon   SINOSCOPY     WISDOM TOOTH EXTRACTION      Current Outpatient Medications  Medication Sig Dispense Refill   albuterol (VENTOLIN HFA) 108 (90 Base) MCG/ACT inhaler INHALE 2 PUFFS BY MOUTH INTO THE LUNGS EVERY 6 HOURS AS NEEDED FOR  WHEEZE OR SHORTNESS OF BREATH 8.5 each 0   budesonide-formoterol (SYMBICORT) 160-4.5 MCG/ACT inhaler Inhale 2 puffs into the lungs 2 (two) times daily. in the morning and at bedtime. 10.2 each 11   buPROPion (WELLBUTRIN XL) 150 MG 24 hr tablet TAKE 1 TABLET BY MOUTH EVERY DAY 90 tablet 0   clotrimazole (GYNE-LOTRIMIN) 1 % vaginal cream Place 1 Applicatorful vaginally at bedtime. (Patient taking differently: Place 1 Applicatorful vaginally daily as needed.) 45 g 0   [employer] D3 25 MCG (1000 UT) capsule TAKE 1 CAPSULE (1,000 UNITS TOTAL) BY MOUTH DAILY. (Patient taking differently: 2,000 Units.) 100 capsule 3   cyclobenzaprine (FLEXERIL) 5 MG tablet TAKE 1 TABLET BY MOUTH EVERYDAY AT BEDTIME 30 tablet 2   Erenumab-aooe (AIMOVIG) 140 MG/ML SOAJ INJECT 140 MG INTO THE SKIN EVERY 30 DAYS 1 mL 2   etonogestrel-ethinyl estradiol (NUVARING) 0.12-0.015 MG/24HR vaginal ring Place 1 each vaginally every 28 (twenty-eight) days. Insert vaginally and leave in place for 3 consecutive weeks, then remove for 1 week. 1 each 0   famotidine (PEPCID) 20 MG tablet Take by mouth.     FeAspGl-FeFum-B12-FA-C-Succ Ac (FERREX 28) MISC Take 1 tablet by mouth daily. 90 each 3   Homeopathic Products (ZINC) LOZG Use as directed in the mouth or throat.     hydroquinone 4 % cream      MAGNESIUM GLUCONATE PO Take 400 mg by mouth daily.     naratriptan (AMERGE) 2.5 MG tablet TAKE 1 TABLET AT ONSET OF HEADACHE MAY REPEAT IN 4 HRS IF NOT RESOLVED DO NOT EXCEED 5 MG IN 24 HRS 18 tablet 1   ondansetron (ZOFRAN-ODT) 4 MG disintegrating tablet DISSOLVE 1 TABLET ON THE TONGUE EVERY 8 HOURS AS NEEDED FOR NAUSEA AND VOMITING 90 tablet 0   RIBOFLAVIN PO Take 400 mg by mouth daily.     rizatriptan (MAXALT) 10 MG tablet Take 1 tablet (10 mg total) by mouth once as needed for migraine. May repeat in 2 hours if needed 10 tablet 2   SYNTHROID 50 MCG tablet Take 1 tablet (50 mcg total) by mouth daily. 90 tablet 1   tretinoin (RETIN-A) 0.05 % cream  SMARTSIG:1 Sparingly Topical Daily     triamcinolone (KENALOG) 0.1 % Apply topically.     Ubrogepant (UBRELVY PO) Take by mouth daily as needed.     valACYclovir (VALTREX) 500 MG tablet TAKE 1 TABLET BY MOUTH EVERY DAY **MYLAN BRAND** 90 tablet 0   vitamin C (ASCORBIC ACID) 500 MG tablet Take 500 mg by mouth daily.     No current facility-administered  medications for this visit.    Allergies as of 06/26/2023 - Review Complete 06/26/2023  Allergen Reaction Noted   Sulfa antibiotics Anaphylaxis 07/25/2011   Montelukast Other (See Comments) 10/08/2015   Other Hives 07/16/2020   Singulair [montelukast sodium]  10/08/2015    Vitals: BP 103/72   Pulse 79   Ht 5\' 7"  (1.702 m)   Wt 150 lb (68 kg)   BMI 23.49 kg/m  Last Weight:  Wt Readings from Last 1 Encounters:  06/26/23 150 lb (68 kg)   Last Height:   Ht Readings from Last 1 Encounters:  06/26/23 5\' 7"  (1.702 m)     Physical exam: Exam: Gen: NAD, conversant, well nourised, obese, well groomed                     CV: RRR, no MRG. No Carotid Bruits. No peripheral edema, warm, nontender Eyes: Conjunctivae clear without exudates or hemorrhage  Neuro: Detailed Neurologic Exam  Speech:    Speech is normal; fluent and spontaneous with normal comprehension.  Cognition:    The patient is oriented to person, place, and time;     recent and remote memory intact;     language fluent;     normal attention, concentration,     fund of knowledge Cranial Nerves:    The pupils are equal, round, and reactive to light. The fundi are normal and spontaneous venous pulsations are present. Visual fields are full to finger confrontation. Extraocular movements are intact. Trigeminal sensation is intact and the muscles of mastication are normal. The face is symmetric. The palate elevates in the midline. Hearing intact. Voice is normal. Shoulder shrug is normal. The tongue has normal motion without fasciculations.   Coordination:    Normal  finger to nose and heel to shin. Normal rapid alternating movements.   Gait:    Heel-toe and tandem gait are normal.   Motor Observation:    No asymmetry, no atrophy, and no involuntary movements noted. Tone:    Normal muscle tone.    Posture:    Posture is normal. normal erect    Strength:    Strength is V/V in the upper and lower limbs.      Sensation: intact to LT     Reflex Exam:  DTR's:    Deep tendon reflexes in the upper and lower extremities are normal bilaterally.   Toes:    The toes are downgoing bilaterally.   Clonus:    Clonus is absent.    Assessment/Plan:  Patient with intractable chronic migraines. Tried a plethora of medications  STOP Aimovig Start Botox Approval   No orders of the defined types were placed in this encounter.  No orders of the defined types were placed in this encounter.   Cc: Tysinger, Kermit Balo, PA-C,  Tysinger, Kermit Balo, PA-C  Naomie Dean, MD  [facility] [address] [place], [place] [address]  Phone [phone] Fax [phone]'

## 2023-06-26 NOTE — Patient Instructions (Signed)
Ajovy monthly - preventative** can keep in mind Nurtec daily as needed - emergent ** can keep in mind Start on Botox approval  When approved, Need a trial botox samples  OnabotulinumtoxinA Injection (Medical Use) What is this medication? ONABOTULINUMTOXINA (o na BOTT you lye num tox in eh) treats severe muscle spasms. It may also be used to prevent migraine headaches. It can treat excessive sweating when other medications do not work well enough. This medicine may be used for other purposes; ask your health care provider or pharmacist if you have questions. COMMON BRAND NAME(S): Botox What should I tell my care team before I take this medication? They need to know if you have any of these conditions: Breathing problems Cerebral palsy spasms Difficulty urinating Heart problems History of surgery where this medication is going to be used Infection at the site where this medication is going to be used Myasthenia gravis or other neurologic disease Nerve or muscle disease Surgery plans Take medications that treat or prevent blood clots Thyroid problems An unusual or allergic reaction to botulinum toxin, albumin, other medications, foods, dyes, or preservatives Pregnant or trying to get pregnant Breast-feeding How should I use this medication? This medication is for injected into a muscle. It is given by your care team in a hospital or clinic setting. A special MedGuide will be given to you before each treatment. Be sure to read this information carefully each time. Talk to your care team about the use of this medication in children. While this medication may be prescribed for children as young as 2 years for selected conditions, precautions do apply. Overdosage: If you think you have taken too much of this medicine contact a poison control center or emergency room at once. NOTE: This medicine is only for you. Do not share this medicine with others. What if I miss a dose? This does not  apply. What may interact with this medication? Aminoglycoside antibiotics, such as gentamicin, neomycin, tobramycin Muscle relaxants Other botulinum toxin injections This list may not describe all possible interactions. Give your health care provider a list of all the medicines, herbs, non-prescription drugs, or dietary supplements you use. Also tell them if you smoke, drink alcohol, or use illegal drugs. Some items may interact with your medicine. What should I watch for while using this medication? Visit your care team for regular check ups. This medication will cause weakness in the muscle where it is injected. Tell your care team if you feel unusually weak in other muscles. Get medical help right away if you have problems with breathing, swallowing, or talking. This medication might make your eyelids droop or make you see blurry or double. If you have weak muscles or trouble seeing do not drive a car, use machinery, or do other dangerous activities. This medication contains albumin from human blood. It may be possible to pass an infection in this medication, but no cases have been reported. Talk to your care team about the risks and benefits of this medication. If your activities have been limited by your condition, go back to your regular routine slowly after treatment with this medication. What side effects may I notice from receiving this medication? Side effects that you should report to your care team as soon as possible: Allergic reactions--skin rash, itching, hives, swelling of the face, lips, tongue, or throat Dryness or irritation of the eyes, eye pain, change in vision, sensitivity to light Infection--fever, chills, cough, sore throat, wounds that don't heal, pain or trouble when  passing urine, general feeling of discomfort or being unwell Spread of botulinum toxin effects--unusual weakness or fatigue, blurry or double vision, trouble swallowing, hoarseness or trouble speaking, trouble  breathing, loss of bladder control Trouble passing urine Side effects that usually do not require medical attention (report these to your care team if they continue or are bothersome): Dry mouth Eyelid drooping Fatigue Headache Pain, redness, or irritation at injection site This list may not describe all possible side effects. Call your doctor for medical advice about side effects. You may report side effects to FDA at 1-800-FDA-1088. Where should I keep my medication? This medication is given in a hospital or clinic and will not be stored at home. NOTE: This sheet is a summary. It may not cover all possible information. If you have questions about this medicine, talk to your doctor, pharmacist, or health care provider.  2024 Elsevier/Gold Standard (2021-10-14 00:00:00)  Rimegepant Disintegrating Tablets What is this medication? RIMEGEPANT (ri ME je pant) prevents and treats migraines. It works by blocking a substance in the body that causes migraines. This medicine may be used for other purposes; ask your health care provider or pharmacist if you have questions. COMMON BRAND NAME(S): NURTEC ODT What should I tell my care team before I take this medication? They need to know if you have any of these conditions: Kidney disease Liver disease An unusual or allergic reaction to rimegepant, other medications, foods, dyes, or preservatives Pregnant or trying to get pregnant Breast-feeding How should I use this medication? Take this medication by mouth. Take it as directed on the prescription label. Leave the tablet in the sealed pack until you are ready to take it. With dry hands, open the pack and gently remove the tablet. If the tablet breaks or crumbles, throw it away. Use a new tablet. Place the tablet in the mouth and allow it to dissolve. Then, swallow it. Do not cut, crush, or chew this medication. You do not need water to take this medication. Talk to your care team about the use of  this medication in children. Special care may be needed. Overdosage: If you think you have taken too much of this medicine contact a poison control center or emergency room at once. NOTE: This medicine is only for you. Do not share this medicine with others. What if I miss a dose? This does not apply. This medication is not for regular use. What may interact with this medication? Certain medications for fungal infections, such as fluconazole, itraconazole Rifampin This list may not describe all possible interactions. Give your health care provider a list of all the medicines, herbs, non-prescription drugs, or dietary supplements you use. Also tell them if you smoke, drink alcohol, or use illegal drugs. Some items may interact with your medicine. What should I watch for while using this medication? Visit your care team for regular checks on your progress. Tell your care team if your symptoms do not start to get better or if they get worse. What side effects may I notice from receiving this medication? Side effects that you should report to your care team as soon as possible: Allergic reactions--skin rash, itching, hives, swelling of the face, lips, tongue, or throat Side effects that usually do not require medical attention (report to your care team if they continue or are bothersome): Nausea Stomach pain This list may not describe all possible side effects. Call your doctor for medical advice about side effects. You may report side effects to FDA at  1-800-FDA-1088. Where should I keep my medication? Keep out of the reach of children and pets. Store at room temperature between 20 and 25 degrees C (68 and 77 degrees F). Get rid of any unused medication after the expiration date. To get rid of medications that are no longer needed or have expired: Take the medication to a medication take-back program. Check with your pharmacy or law enforcement to find a location. If you cannot return the  medication, check the label or package insert to see if the medication should be thrown out in the garbage or flushed down the toilet. If you are not sure, ask your care team. If it is safe to put it in the trash, take the medication out of the container. Mix the medication with cat litter, dirt, coffee grounds, or other unwanted substance. Seal the mixture in a bag or container. Put it in the trash. NOTE: This sheet is a summary. It may not cover all possible information. If you have questions about this medicine, talk to your doctor, pharmacist, or health care provider.  2024 Elsevier/Gold Standard (2021-12-08 00:00:00) Vernell Barrier Injection What is this medication? FREMANEZUMAB (fre ma NEZ ue mab) prevents migraines. It works by blocking a substance in the body that causes migraines. It is a monoclonal antibody. This medicine may be used for other purposes; ask your health care provider or pharmacist if you have questions. COMMON BRAND NAME(S): AJOVY What should I tell my care team before I take this medication? They need to know if you have any of these conditions: An unusual or allergic reaction to fremanezumab, other medications, foods, dyes, or preservatives Pregnant or trying to get pregnant Breast-feeding How should I use this medication? This medication is injected under the skin. You will be taught how to prepare and give it. Take it as directed on the prescription label. Keep taking it unless your care team tells you to stop. It is important that you put your used needles and syringes in a special sharps container. Do not put them in a trash can. If you do not have a sharps container, call your pharmacist or care team to get one. Talk to your care team about the use of this medication in children. Special care may be needed. Overdosage: If you think you have taken too much of this medicine contact a poison control center or emergency room at once. NOTE: This medicine is only for you.  Do not share this medicine with others. What if I miss a dose? If you miss a dose, take it as soon as you can. If it is almost time for your next dose, take only that dose. Do not take double or extra doses. What may interact with this medication? Interactions are not expected. This list may not describe all possible interactions. Give your health care provider a list of all the medicines, herbs, non-prescription drugs, or dietary supplements you use. Also tell them if you smoke, drink alcohol, or use illegal drugs. Some items may interact with your medicine. What should I watch for while using this medication? Tell your care team if your symptoms do not start to get better or if they get worse. What side effects may I notice from receiving this medication? Side effects that you should report to your care team as soon as possible: Allergic reactions or angioedema--skin rash, itching or hives, swelling of the face, eyes, lips, tongue, arms, or legs, trouble swallowing or breathing Side effects that usually do not require medical  attention (report to your care team if they continue or are bothersome): Pain, redness, or irritation at injection site This list may not describe all possible side effects. Call your doctor for medical advice about side effects. You may report side effects to FDA at 1-800-FDA-1088. Where should I keep my medication? Keep out of the reach of children and pets. Store in a refrigerator or at room temperature between 20 and 25 degrees C (68 and 77 degrees F). Refrigeration (preferred): Store in the refrigerator. Do not freeze. Keep in the original container until you are ready to take it. Remove the dose from the carton about 30 minutes before it is time for you to use it. If the dose is not used, it may be stored in the original container at room temperature for 7 days. Get rid of any unused medication after the expiration date. Room Temperature: This medication may be stored  at room temperature for up to 7 days. Keep it in the original container. Protect from light until time of use. If it is stored at room temperature, get rid of any unused medication after 7 days or after it expires, whichever is first. To get rid of medications that are no longer needed or have expired: Take the medication to a medication take-back program. Check with your pharmacy or law enforcement to find a location. If you cannot return the medication, ask your pharmacist or care team how to get rid of this medication safely. NOTE: This sheet is a summary. It may not cover all possible information. If you have questions about this medicine, talk to your doctor, pharmacist, or health care provider.  2024 Elsevier/Gold Standard (2021-12-10 00:00:00)

## 2023-06-27 ENCOUNTER — Telehealth: Payer: Self-pay | Admitting: Neurology

## 2023-06-27 ENCOUNTER — Encounter: Payer: Self-pay | Admitting: Neurology

## 2023-06-27 DIAGNOSIS — G43711 Chronic migraine without aura, intractable, with status migrainosus: Secondary | ICD-10-CM

## 2023-06-27 NOTE — Telephone Encounter (Signed)
Anson Fret, MD  [facility] Call[facility]; Alphonsa Gin [place], [place] [phone] please start botox for migraine protocol  Received above staff message from Dr. Lucia Gaskins requesting Botox new start. I submitted benefit verification via [employer], will update when results are received. [id number]

## 2023-06-28 DIAGNOSIS — H66001 Acute suppurative otitis media without spontaneous rupture of ear drum, right ear: Secondary | ICD-10-CM | POA: Diagnosis not present

## 2023-06-28 DIAGNOSIS — Z6823 Body mass index (BMI) 23.0-23.9, adult: Secondary | ICD-10-CM | POA: Diagnosis not present

## 2023-06-28 DIAGNOSIS — Z8619 Personal history of other infectious and parasitic diseases: Secondary | ICD-10-CM | POA: Diagnosis not present

## 2023-06-28 NOTE — Telephone Encounter (Signed)
Completed [employer] start form and placed in nurse pod for MD signature.

## 2023-06-30 NOTE — Telephone Encounter (Signed)
Renewal approved til 06/23/24

## 2023-07-05 MED ORDER — BOTOX 200 UNITS IJ SOLR
INTRAMUSCULAR | 3 refills | Status: DC
Start: 2023-07-05 — End: 2023-07-05

## 2023-07-05 MED ORDER — BOTOX 200 UNITS IJ SOLR: INTRAMUSCULAR | 1 refills | Status: DC

## 2023-07-05 MED ORDER — BOTOX 200 UNITS IJ SOLR: INTRAMUSCULAR | 3 refills | Status: DC

## 2023-07-05 NOTE — Telephone Encounter (Signed)
Faxed signed form along with OV notes to [facility] @ [phone].

## 2023-07-05 NOTE — Addendum Note (Signed)
Addended by: Bertram Savin on: 07/05/2023 04:02 PM   Modules accepted: Orders

## 2023-07-05 NOTE — Addendum Note (Signed)
Addended by: Bertram Savin on: 07/05/2023 04:03 PM   Modules accepted: Orders

## 2023-07-05 NOTE — Telephone Encounter (Signed)
Received approval from [employer] #: [id number] (07/05/23-12/20/23). Please send rx to [facility].

## 2023-07-05 NOTE — Telephone Encounter (Signed)
Unable to e-scribe to [facility] in [place] so I sent it here:

## 2023-07-05 NOTE — Addendum Note (Signed)
Addended by: Bertram Savin on: 07/05/2023 04:13 PM   Modules accepted: Orders

## 2023-07-10 NOTE — Telephone Encounter (Signed)
[facility] is stating that pt needs to fill through [facility]. Per [employer], reauth is required to change servicing provider. I was able to change original auth form and refax under [facility].

## 2023-07-11 MED ORDER — BOTOX 200 UNITS IJ SOLR
INTRAMUSCULAR | 1 refills | Status: DC
Start: 2023-07-11 — End: 2023-12-02

## 2023-07-11 NOTE — Telephone Encounter (Signed)
[facility] told me that they aren't able to fill Botox, it has to be [facility]. I submitted a new auth with [facility] as the servicing provider and it was approved. Auth#: [id number] (07/10/23-12/25/23)  Please send rx to [facility], thank you!

## 2023-07-11 NOTE — Telephone Encounter (Signed)
Botox Rx sent to [facility]

## 2023-07-11 NOTE — Addendum Note (Signed)
Addended by: Bertram Savin on: 07/11/2023 08:54 AM   Modules accepted: Orders

## 2023-07-11 NOTE — Telephone Encounter (Signed)
Scheduled pt for 08/09/23 @ 12:45 pm with Shanda Bumps.

## 2023-07-13 ENCOUNTER — Other Ambulatory Visit: Payer: Self-pay

## 2023-07-13 ENCOUNTER — Ambulatory Visit: Payer: BC Managed Care – PPO | Admitting: Sports Medicine

## 2023-07-13 VITALS — BP 120/82 | Ht 67.0 in | Wt 151.0 lb

## 2023-07-13 DIAGNOSIS — G8929 Other chronic pain: Secondary | ICD-10-CM

## 2023-07-13 DIAGNOSIS — M2292 Unspecified disorder of patella, left knee: Secondary | ICD-10-CM

## 2023-07-13 DIAGNOSIS — S86892A Other injury of other muscle(s) and tendon(s) at lower leg level, left leg, initial encounter: Secondary | ICD-10-CM

## 2023-07-13 DIAGNOSIS — M25462 Effusion, left knee: Secondary | ICD-10-CM

## 2023-07-13 NOTE — Progress Notes (Signed)
PCP: Jac Canavan, PA-C  SUBJECTIVE:   HPI:  Patient is a 48 y.o. female here with chief complaint of acute on chronic left knee pain.  She notes that she has had intermittent anterior knee pain in the left knee for the past 2 years though over the past 2 weeks this has been much more tender.  She locates its on top of her left patella and states that there is a little swelling present as well.  She thinks she aggravated it with deep leg presses or explosive jumps and drop squats.  Not terribly painful at rest though any pressure on the anterior aspect of the patella is exquisitely tender.  She has no prior injuries or surgeries to the left knee.  She has been trying icing, ultrasounding and Advil without significant improvement.  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  ROS:     See HPI  PERTINENT  PMH / PSH FH / / SH:  Past Medical, Surgical, Social, and Family History Reviewed & Updated in the EMR.  Pertinent findings include:  Hx of hip fracture, right  Allergies  Allergen Reactions   Sulfa Antibiotics Anaphylaxis   Montelukast Other (See Comments)    Angry, irritable, out of skin feeling   Other Hives    Dark alcohol   Singulair [Montelukast Sodium]     Angry, irritable, out of skin feeling    OBJECTIVE:  BP 120/82   Ht 5\' 7"  (1.702 m)   Wt 151 lb (68.5 kg)   BMI 23.65 kg/m   PHYSICAL EXAM:  GEN: Alert and Oriented, NAD, comfortable in exam room RESP: Unlabored respirations, symmetric chest rise PSY: normal mood, congruent affect   LEFT KNEE MSK EXAM: No gross deformity, ecchymoses. 1cmx1cm swelling noted on the anterior aspect of patella. TTP over anterior patella, particularly over this swelling. Mild TTP along lateral joint line. No TTP of distal quad, patella tendon, or medial joint line. FROM with normal strength. Negative valgus/varus testing. Negative lachman. Negative mcmurrays, apleys. NV  intact distally.  Limited ultrasound of the left knee :  shows hypoechoic fluid collection superficial to the cortex of inferior pole of the patella This is proximal to the attachment site of the patella tendon.  Positive probe sign here as well.   Remainder of the patella tendon appears intact with normal fibrillar pattern and without swelling.   Lateral joint line with meniscal bulging though no visible defect of the lateral meniscus.   No defect noted to the medial meniscus.  Preserved joint space.   Small effusion at the suprapatellar pouch. (Not on RT)  Impression: Partial disruption of the patellar capsule with inflammatory swelling response as well as a mild knee effusion.  Ultrasound and interpretation by Dr. Derrill Center and Sibyl Parr. Fields, MD     ASSESSMENT & PLAN:  1. Disorder of left patella 2. Effusion of left knee She has a partial disruption of the patella capsule at the most proximal attachment site of the patella tendon.  Based on her history it is unclear the exact mechanism of how this would happen as well as how she has some degree of left knee effusion.  Suspect some lateral knee cartilage contusion given her normal meniscus exam and ultrasound.  Recommended icing, Voltaren 3 times daily, and eccentric patellar and quadricep exercises.  Advised avoiding deep squats and heavy resistance knee extensions until resolution of anterior knee pain.  Patient's  questions were answered and she is in agreement with this plan.  She will follow-up with [place] on a as needed basis.   Glean Salen, MD PGY-4, Sports Medicine Fellow [facility]  I observed and examined the patient with the [facility] resident and agree with assessment and plan.  Note reviewed and modified by me. Sterling Big, MD

## 2023-07-13 NOTE — Patient Instructions (Addendum)
Your knee pain is from a partial separation of the patellar capsule on the top of the patella.  Recommendations for this including icing multiple times daily when painful, topical Voltaren gel 3x daily and working on light eccentric stretching of the quadriceps. Try to avoid explosive knee extension exercises or deep squatting. We can see you back in 4-6 weeks if failing to make improvement.

## 2023-07-20 ENCOUNTER — Other Ambulatory Visit: Payer: Self-pay | Admitting: Medical

## 2023-08-02 ENCOUNTER — Other Ambulatory Visit: Payer: Self-pay | Admitting: Medical

## 2023-08-02 ENCOUNTER — Encounter: Payer: Self-pay | Admitting: Internal Medicine

## 2023-08-07 ENCOUNTER — Other Ambulatory Visit: Payer: Self-pay | Admitting: Medical

## 2023-08-08 ENCOUNTER — Ambulatory Visit: Payer: BC Managed Care – PPO | Admitting: Sports Medicine

## 2023-08-09 ENCOUNTER — Ambulatory Visit: Payer: BC Managed Care – PPO | Admitting: Adult Health

## 2023-08-14 ENCOUNTER — Encounter: Payer: Self-pay | Admitting: Neurology

## 2023-08-14 ENCOUNTER — Ambulatory Visit: Payer: BC Managed Care – PPO | Admitting: Neurology

## 2023-08-14 VITALS — BP 127/83 | HR 90 | Ht 67.0 in | Wt 154.0 lb

## 2023-08-14 DIAGNOSIS — G43709 Chronic migraine without aura, not intractable, without status migrainosus: Secondary | ICD-10-CM | POA: Diagnosis not present

## 2023-08-14 MED ORDER — AJOVY 225 MG/1.5ML ~~LOC~~ SOAJ
225.0000 mg | SUBCUTANEOUS | 11 refills | Status: DC
Start: 2023-08-14 — End: 2024-09-03

## 2023-08-14 NOTE — Progress Notes (Signed)
[facility]    Provider:  Dr Lucia Gaskins Requesting Provider: Aleen Campi Kermit Balo, PA-C Primary Care Provider:  Jac Canavan, PA-C  CC:  Migraine  08/14/2023: Misunderstanding she wanted prescription for Ajovy. She does not want to complete botox at this time. Cancel botox and give Ajovy. We will No Charge did not have to come down. Failed aimovig/emality(was on aimovig and was not effective > 3 months ans well as emgality samples not effective) and a plethora of other meds see below. Start Ajovy.had a great response to samples will prescribe great news.   HPI:  Barbara Odonnell is a 47 y.o. female here as requested by Jac Canavan, PA-C for migraines. has Hypothyroidism; Chronic migraine without aura without status migrainosus, not intractable; Celiac disease; Vitamin D deficiency; Iron deficiency anemia; GERD (gastroesophageal reflux disease); Family history of osteoporosis; History of stress fracture; Allergic rhinitis due to pollen; Vaccine counseling; History of colitis; Hx of colonic polyp; Raynaud's phenomenon without gangrene; Pruritic condition; Chronic fatigue; History of Epstein-Barr virus infection; History of hip fracture; Encounter for health maintenance examination in adult; Moderate persistent asthma without complication; Need for influenza vaccination; Need for Tdap vaccination; Screening for lipid disorders; Hives; History of COVID-19; Palpitation; Nausea; Hair loss; Shakes; Dysuria; Urine frequency; Skin lesion; Chronic nausea; Gastroesophageal reflux disease; Moderate persistent asthma; Pruritus; Elevated serum creatinine; Food allergy; and Chronic migraine without aura, with intractable migraine, so stated, with status migrainosus on their problem list. 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   He had brain imaging about 10 years ago and it was normal by report. Migraines started in 37s, mother and aunt get migraines. Triggers are lack of sleep,  neck pain, high-intensity exercise, sleep, stress, hormones. 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 She feels perimenopause has made her migraines much much worse in feq and severity and more resistant to acute management. On Aimovig. Aimvig helps with the high intensity the most but Not significantly helping overall. For the last year, Having daily headaches, at least 20 mod to severe migraine days, only treats if the migraine goes over 3 days. Headaches last 24-72 hours and has lasted more 72 hours. Has neck pain. Pulsating/pounding/throbbing, can be severe 10/10, can start unilaterally or the really bad ones are in the neck and holocephalic, extreme nausea, can't move, photo/phonophobia. Heat and ice help. No other focal neurologic deficits, associated symptoms, inciting events or modifiable factors.  Reviewed notes, labs and imaging from outside physicians, which showed:   From a thorough review of records, medications tried that can be used in migraine management include Aimovig, propranolol contraindicated due to asthma, Wellbutrin, Fioricet, Flexeril, Robaxin, Solu-Medrol injections, naratriptan(fav for mild to moderate), Zofran, prednisone tablets, sumatriptan, Maxalt, Nurtec, tizanidine, Ubrelvy,Topiramate(side effects), cymbalta, effexor, can't take BP meds due to hypotension, amitriptyline/nortriptyline, zoloft, prozac, Aimovig - not helpful, Emgality- not helpful, Ajovy samples have been extrenely helpful reduced migraine burden by 50% will prescribe. Failed aimovig/emality(was on aimovig and was not effective > 3 months ans well as emgality samples not effective) and a plethora of other meds see below. Start Ajovy.had a great response to samples will prescribe great news.      Latest Ref Rng & Units 03/24/2022   12:29 PM 12/18/2020    2:02 PM 09/15/2020    9:37 AM  CMP   Glucose 70 - 99 mg/dL 82  85  80   BUN 6 - 24 mg/dL 11  7  11    Creatinine 0.57 - 1.00 mg/dL 3.87  5.64  3.32   Sodium 134 - 144 mmol/L 138  137  139   Potassium 3.5 - 5.2 mmol/L 4.1  4.5  4.3   Chloride 96 - 106 mmol/L 101  99  101   CO2 20 - 29 mmol/L 21  24  20    Calcium 8.7 - 10.2 mg/dL 9.6  9.5  9.8   Total Protein 6.0 - 8.5 g/dL  6.8  7.2   Total Bilirubin 0.0 - 1.2 mg/dL  0.3  0.4   Alkaline Phos 44 - 121 IU/L  51  44   AST 0 - 40 IU/L  24  25   ALT 0 - 32 IU/L  17  12       Latest Ref Rng & Units 03/24/2022   12:29 PM 12/18/2020    2:02 PM 09/15/2020    9:37 AM  CBC  WBC 3.4 - 10.8 x10E3/uL 6.3  4.7  6.6   Hemoglobin 11.1 - 15.9 g/dL 95.1  88.4  16.6   Hematocrit 34.0 - 46.6 % 42.1  44.2  45.9   Platelets 150 - 450 x10E3/uL 212  222  247     Review of Systems: Patient complains of symptoms per HPI as well as the following symptoms none. Pertinent negatives and positives per HPI. All others negative.   Social History   Socioeconomic History   Marital status: Single    Spouse name: Not on file   Number of children: Not on file   Years of education: Not on file   Highest education level: Not on file  Occupational History   Not on file  Tobacco Use   Smoking status: Never   Smokeless tobacco: Never  Vaping Use   Vaping status: Never Used  Substance and Sexual Activity   Alcohol use: Yes    Alcohol/week: 2.0 standard drinks of alcohol    Types: 1 Glasses of wine, 1 Shots of liquor per week    Comment: rare   Drug use: No   Sexual activity: Not on file  Other Topics Concern   Not on file  Social History Narrative   Lives alone, 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   Prior worked as professor at [employer].  Exercise:- 6-7 days per week, teaches spin at [employer], swam competitively in college, marathon runner.       [facility]:   Originally from [place]. Has also  lived in [place], [place], & [place].  Currently working in counseling. No pets currently. No bird exposure. No mold or hot tub exposure. Currently has carpet in the bedroom. No draperies. No exposure to asbestos.      Social Determinants of Health   Financial Resource Strain: Not on file  Food Insecurity: Not on file  Transportation Needs: Not on file  Physical Activity: Not on file  Stress: Not on file  Social Connections: Not on file  Intimate Partner Violence: Not on file    Family History  Problem Relation Age of Onset   Hypertension Mother    Osteopenia Mother    Raynaud syndrome Mother    Celiac disease Mother    Cancer Father        asbestos related lung cancer   Heart disease Father 26       Stent   Pulmonary  embolism Father    Celiac disease Brother    Celiac disease Brother    Stroke Maternal Grandmother    COPD Maternal Grandmother    Osteopenia Maternal Grandmother    Heart disease Maternal Grandfather    Stroke Maternal Grandfather    Heart disease Paternal Grandfather        died 28yo   Diabetes Neg Hx     Past Medical History:  Diagnosis Date   Allergy    Anemia    IV iron in past, prior to 2018   Asthma    Celiac disease    +prior biopsy   GERD (gastroesophageal reflux disease)    H/O bone density study 12/21/2017   normal   Hip fracture (HCC) 2008   right, Dr. Darrick Penna   History of stress test 2014   [facility]   Hypothyroidism    Migraine headache without aura    gets bad nausea.  does well on Naratriptan and Relpax.  Failed Imitrex, Maxalt   Stress fracture    severeral prior   Transfusion history    has had IV iron prior, Dr. Myna Hidalgo prior management    Patient Active Problem List   Diagnosis Date Noted   Chronic migraine without aura, with intractable migraine, so stated, with status migrainosus 06/26/2023   Elevated serum creatinine 03/24/2022   Food allergy 03/24/2022   Dysuria 06/02/2021   Urine frequency 06/02/2021   Skin lesion 06/02/2021    Chronic nausea 06/02/2021   Gastroesophageal reflux disease 06/02/2021   Moderate persistent asthma 06/02/2021   Pruritus 06/02/2021   History of COVID-19 03/16/2021   Palpitation 03/16/2021   Nausea 03/16/2021   Hair loss 03/16/2021   Shakes 03/16/2021   History of hip fracture 09/15/2020   Encounter for health maintenance examination in adult 09/15/2020   Moderate persistent asthma without complication 09/15/2020   Need for influenza vaccination 09/15/2020   Need for Tdap vaccination 09/15/2020   Screening for lipid disorders 09/15/2020   Hives 09/15/2020   History of Epstein-Barr virus infection 01/08/2020   Chronic fatigue 10/23/2019   Raynaud's phenomenon without gangrene 09/24/2019   Pruritic condition 09/24/2019   Hx of colonic polyp 09/13/2019   Vaccine counseling 03/11/2019   History of colitis 03/11/2019   Allergic rhinitis due to pollen 02/04/2019   Family history of osteoporosis 11/28/2017   History of stress fracture 11/28/2017   GERD (gastroesophageal reflux disease) 04/08/2016   Hypothyroidism 10/08/2015   Chronic migraine without aura without status migrainosus, not intractable 10/08/2015   Celiac disease 10/08/2015   Vitamin D deficiency 10/08/2015   Iron deficiency anemia 10/08/2015    Past Surgical History:  Procedure Laterality Date   COLONOSCOPY  2015   [facility], advised to repeat q5 years; x 4 prior   ESOPHAGOGASTRODUODENOSCOPY     x5 prior as of 2016   INGUINAL HERNIA REPAIR     left   NASAL SEPTUM SURGERY     POLYPECTOMY     sigmoid colon   SINOSCOPY     WISDOM TOOTH EXTRACTION      Current Outpatient Medications  Medication Sig Dispense Refill   botulinum toxin Type A (BOTOX) 200 units injection Provider to inject 155 units into the muscles of the head and neck every 12 weeks. Discard remainder. 1 each 1   budesonide-formoterol (SYMBICORT) 160-4.5 MCG/ACT inhaler Inhale 2 puffs into the lungs 2 (two) times daily. in the morning and  at bedtime. 10.2 each 11   buPROPion (WELLBUTRIN XL)  150 MG 24 hr tablet TAKE 1 TABLET BY MOUTH EVERY DAY 90 tablet 0   cyclobenzaprine (FLEXERIL) 5 MG tablet TAKE 1 TABLET BY MOUTH EVERYDAY AT BEDTIME 30 tablet 2   etonogestrel-ethinyl estradiol (NUVARING) 0.12-0.015 MG/24HR vaginal ring Place 1 each vaginally every 28 (twenty-eight) days. Insert vaginally and leave in place for 3 consecutive weeks, then remove for 1 week. 1 each 0   famotidine (PEPCID) 20 MG tablet Take by mouth 2 (two) times daily.     FeAspGl-FeFum-B12-FA-C-Succ Ac (FERREX 28) MISC Take 1 tablet by mouth daily. 90 each 3   Fremanezumab-vfrm (AJOVY) 225 MG/1.5ML SOAJ Inject 225 mg into the skin every 30 (thirty) days. Please run copay card: BIN [id number] PCN [id number] GRP [id number] ID [id number] 1.5 mL 11   Homeopathic Products (ZINC) LOZG Use as directed in the mouth or throat.     hydroquinone 4 % cream      MAGNESIUM GLUCONATE PO Take 400 mg by mouth daily.     naratriptan (AMERGE) 2.5 MG tablet TAKE 1 TABLET AT ONSET OF HEADACHE MAY REPEAT IN 4 HRS IF NOT RESOLVED DO NOT EXCEED 5 MG IN 24 HRS 18 tablet 0   ondansetron (ZOFRAN-ODT) 4 MG disintegrating tablet DISSOLVE 1 TABLET ON THE TONGUE EVERY 8 HOURS AS NEEDED FOR NAUSEA AND VOMITING 90 tablet 0   RIBOFLAVIN PO Take 400 mg by mouth daily.     rizatriptan (MAXALT) 10 MG tablet Take 1 tablet (10 mg total) by mouth once as needed for migraine. May repeat in 2 hours if needed 10 tablet 2   SYNTHROID 50 MCG tablet Take 1 tablet (50 mcg total) by mouth daily. 90 tablet 1   tretinoin (RETIN-A) 0.05 % cream SMARTSIG:1 Sparingly Topical Daily     triamcinolone (KENALOG) 0.1 % Apply topically.     Ubrogepant (UBRELVY PO) Take by mouth daily as needed.     UNABLE TO FIND Med Name: Pro air inhaler HFA     valACYclovir (VALTREX) 500 MG tablet TAKE 1 TABLET BY MOUTH EVERY DAY **MYLAN BRAND** 90 tablet 0   vitamin C (ASCORBIC ACID) 500 MG tablet Take 500 mg by mouth daily.     No current  facility-administered medications for this visit.    Allergies as of 08/14/2023 - Review Complete 08/14/2023  Allergen Reaction Noted   Sulfa antibiotics Anaphylaxis 07/25/2011   Montelukast Other (See Comments) 10/08/2015   Other Hives 07/16/2020   Singulair [montelukast sodium]  10/08/2015    Vitals: BP 127/83   Pulse 90   Ht 5\' 7"  (1.702 m)   Wt 154 lb (69.9 kg)   BMI 24.12 kg/m  Last Weight:  Wt Readings from Last 1 Encounters:  08/14/23 154 lb (69.9 kg)   Last Height:   Ht Readings from Last 1 Encounters:  08/14/23 5\' 7"  (1.702 m)    Exam: NAD, pleasant                  Speech:    Speech is normal; fluent and spontaneous with normal comprehension.  Cognition:    The patient is oriented to person, place, and time;     recent and remote memory intact;     language fluent;    Cranial Nerves:    The pupils are equal, round, and reactive to light.Trigeminal sensation is intact and the muscles of mastication are normal. The face is symmetric. The palate elevates in the midline. Hearing intact. Voice is normal. Shoulder shrug is normal. The tongue  has normal motion without fasciculations.   Coordination:  No dysmetria  Motor Observation:    No asymmetry, no atrophy, and no involuntary movements noted. Tone:    Normal muscle tone.     Strength:    Strength is V/V in the upper and lower limbs.      Sensation: intact to LT    Assessment/Plan:  Patient with intractable chronic migraines. Tried a plethora of medications.  For the last year, Having daily headaches, at least 20 mod to severe migraine days. Failed aimovig/emality(was on aimovig and was not effective > 3 months ans well as emgality samples not effective) and a plethora of other meds see below. Start Ajovy.had a great response to samples will prescribe great news. Will NOT get botox. Continue Ajovy.   Meds ordered this encounter  Medications   Fremanezumab-vfrm (AJOVY) 225 MG/1.5ML SOAJ    Sig: Inject  225 mg into the skin every 30 (thirty) days. Please run copay card: BIN [id number] PCN PDMI GRP [id number] ID [id number]    Dispense:  1.5 mL    Refill:  11    Please run copay card: BIN [id number] PCN PDMI GRP [id number] ID [id number]    STOPPED Aimovig months ago [employer] daily as needed - emergent can keep in mind at a later date continue naratriptan and maxalt for different types of headache doesn;t use them together also flexeril  From a thorough review of records, medications tried that can be used in migraine management include > 3 months Aimovig, propranolol contraindicated due to asthma, Wellbutrin, Fioricet, Flexeril, Robaxin, Solu-Medrol injections, naratriptan(fav for mild to moderate), Zofran, prednisone tablets, sumatriptan, Maxalt, Nurtec, tizanidine, Ubrelvy,Topiramate(side effects), cymbalta, effexor, can't take BP meds due to hypotension, amitriptyline/nortriptyline, zoloft, prozac, Aimovig - not helpful, Emgality- not helpful, Ajovy samples have been extrenely helpful reduced migraine burden by 50% will prescribe; Failed aimovig/emality(was on aimovig and was not effective > 3 months ans well as emgality samples not effective) and a plethora of other meds see below. Start Ajovy.had a great response to samples will prescribe great news.   Cc: Tysinger, Cleda Mccreedy,  Tysinger, Kermit Balo, PA-C  Naomie Dean, MD  [facility] [address] [place], [place] [address]  Phone [phone] Fax [phone]  I spent 1 minutes of face-to-face and non-face-to-face time with patient on the  1. Chronic migraine without aura without status migrainosus, not intractable    diagnosis.  This included previsit chart review, lab review, study review, order entry, electronic health record documentation, patient education on the different diagnostic and therapeutic options, counseling and coordination of care, risks and benefits of management, compliance, or risk  factor reduction

## 2023-08-17 ENCOUNTER — Telehealth: Payer: Self-pay | Admitting: *Deleted

## 2023-08-17 ENCOUNTER — Telehealth: Payer: Self-pay

## 2023-08-17 ENCOUNTER — Other Ambulatory Visit (HOSPITAL_COMMUNITY): Payer: Self-pay

## 2023-08-17 NOTE — Telephone Encounter (Signed)
PA request has been Submitted. New Encounter created for follow up. For additional info see Pharmacy Prior Auth telephone encounter from 08/17/2023.

## 2023-08-17 NOTE — Telephone Encounter (Signed)
Pharmacy Patient Advocate Encounter   Received notification from Physician's Office that prior authorization for AJOVY (fremanezumab-vfrm) injection 225MG /1.5ML auto-injectors is required/requested.   Insurance verification completed.   The patient is insured through [facility] .   Per test claim: PA required; PA started via CoverMyMeds. KEY [id number] . Waiting for clinical questions to populate.

## 2023-08-17 NOTE — Telephone Encounter (Signed)
Pt needs PA for Ajovy

## 2023-08-25 NOTE — Telephone Encounter (Signed)
Clinical questions have been submitted-awaiting determination. 

## 2023-08-31 NOTE — Telephone Encounter (Signed)
Any updates on this PA.

## 2023-09-01 ENCOUNTER — Other Ambulatory Visit: Payer: Self-pay | Admitting: Medical

## 2023-09-01 ENCOUNTER — Other Ambulatory Visit (HOSPITAL_COMMUNITY): Payer: Self-pay

## 2023-09-01 NOTE — Telephone Encounter (Signed)
Pharmacy Patient Advocate Encounter  Received notification from [facility] that Prior Authorization for AJOVY (fremanezumab-vfrm) injection 225MG /1.5ML auto-injectors has been APPROVED from 08/28/2023 to 11/17/2023. Ran test claim, Copay is $24.98. This test claim was processed through [facility]- copay amounts may vary at other pharmacies due to pharmacy/plan contracts, or as the patient moves through the different stages of their insurance plan.   PA #/Case ID/Reference #: PA Case ID #: [id number]

## 2023-09-02 ENCOUNTER — Other Ambulatory Visit: Payer: Self-pay | Admitting: Medical

## 2023-09-03 ENCOUNTER — Other Ambulatory Visit: Payer: Self-pay | Admitting: Medical

## 2023-09-04 MED ORDER — ALBUTEROL SULFATE HFA 108 (90 BASE) MCG/ACT IN AERS: 2.0000 | INHALATION_SPRAY | Freq: Four times a day (QID) | RESPIRATORY_TRACT | 1 refills | Status: DC | PRN

## 2023-09-04 NOTE — Telephone Encounter (Signed)
Patient already has an appointment scheduled for Dec

## 2023-09-11 DIAGNOSIS — E039 Hypothyroidism, unspecified: Secondary | ICD-10-CM | POA: Diagnosis not present

## 2023-09-11 DIAGNOSIS — Z01419 Encounter for gynecological examination (general) (routine) without abnormal findings: Secondary | ICD-10-CM | POA: Diagnosis not present

## 2023-09-11 DIAGNOSIS — E559 Vitamin D deficiency, unspecified: Secondary | ICD-10-CM | POA: Diagnosis not present

## 2023-09-11 DIAGNOSIS — M8580 Other specified disorders of bone density and structure, unspecified site: Secondary | ICD-10-CM | POA: Diagnosis not present

## 2023-09-11 DIAGNOSIS — N951 Menopausal and female climacteric states: Secondary | ICD-10-CM | POA: Diagnosis not present

## 2023-09-15 DIAGNOSIS — L308 Other specified dermatitis: Secondary | ICD-10-CM | POA: Diagnosis not present

## 2023-09-17 ENCOUNTER — Encounter: Payer: Self-pay | Admitting: Sports Medicine

## 2023-09-19 MED ORDER — DICLOFENAC EPOLAMINE 1.3 % EX PTCH: 1.0000 | MEDICATED_PATCH | Freq: Two times a day (BID) | CUTANEOUS | 1 refills | Status: AC

## 2023-09-22 DIAGNOSIS — M25562 Pain in left knee: Secondary | ICD-10-CM | POA: Diagnosis not present

## 2023-09-27 ENCOUNTER — Ambulatory Visit: Payer: BC Managed Care – PPO | Admitting: Sports Medicine

## 2023-10-01 ENCOUNTER — Other Ambulatory Visit: Payer: Self-pay | Admitting: Medical

## 2023-10-11 ENCOUNTER — Encounter: Payer: BC Managed Care – PPO | Admitting: Medical

## 2023-10-19 ENCOUNTER — Ambulatory Visit: Payer: BC Managed Care – PPO | Admitting: Family Medicine

## 2023-10-23 ENCOUNTER — Encounter: Payer: BC Managed Care – PPO | Admitting: Medical

## 2023-10-29 ENCOUNTER — Other Ambulatory Visit: Payer: Self-pay | Admitting: Medical

## 2023-11-01 ENCOUNTER — Other Ambulatory Visit: Payer: Self-pay | Admitting: Medical

## 2023-11-02 ENCOUNTER — Other Ambulatory Visit: Payer: Self-pay | Admitting: Medical

## 2023-11-06 ENCOUNTER — Ambulatory Visit (INDEPENDENT_AMBULATORY_CARE_PROVIDER_SITE_OTHER): Payer: BC Managed Care – PPO | Admitting: Medical

## 2023-11-06 ENCOUNTER — Encounter: Payer: Self-pay | Admitting: Medical

## 2023-11-06 VITALS — BP 118/80 | HR 92 | Ht 67.0 in | Wt 157.8 lb

## 2023-11-06 DIAGNOSIS — D509 Iron deficiency anemia, unspecified: Secondary | ICD-10-CM

## 2023-11-06 DIAGNOSIS — K900 Celiac disease: Secondary | ICD-10-CM

## 2023-11-06 DIAGNOSIS — I7300 Raynaud's syndrome without gangrene: Secondary | ICD-10-CM

## 2023-11-06 DIAGNOSIS — E039 Hypothyroidism, unspecified: Secondary | ICD-10-CM

## 2023-11-06 DIAGNOSIS — Z0000 Encounter for general adult medical examination without abnormal findings: Secondary | ICD-10-CM | POA: Diagnosis not present

## 2023-11-06 DIAGNOSIS — Z13220 Encounter for screening for lipoid disorders: Secondary | ICD-10-CM

## 2023-11-06 DIAGNOSIS — G43709 Chronic migraine without aura, not intractable, without status migrainosus: Secondary | ICD-10-CM

## 2023-11-06 DIAGNOSIS — E559 Vitamin D deficiency, unspecified: Secondary | ICD-10-CM

## 2023-11-06 DIAGNOSIS — K219 Gastro-esophageal reflux disease without esophagitis: Secondary | ICD-10-CM

## 2023-11-06 DIAGNOSIS — J301 Allergic rhinitis due to pollen: Secondary | ICD-10-CM

## 2023-11-06 DIAGNOSIS — Z860100 Personal history of colon polyps, unspecified: Secondary | ICD-10-CM

## 2023-11-06 DIAGNOSIS — R110 Nausea: Secondary | ICD-10-CM

## 2023-11-06 DIAGNOSIS — Z7185 Encounter for immunization safety counseling: Secondary | ICD-10-CM

## 2023-11-06 DIAGNOSIS — J4540 Moderate persistent asthma, uncomplicated: Secondary | ICD-10-CM | POA: Diagnosis not present

## 2023-11-06 DIAGNOSIS — R5382 Chronic fatigue, unspecified: Secondary | ICD-10-CM

## 2023-11-06 LAB — POCT URINALYSIS DIP (PROADVANTAGE DEVICE)
Bilirubin, UA: NEGATIVE
Blood, UA: NEGATIVE
Glucose, UA: NEGATIVE mg/dL
Ketones, POC UA: NEGATIVE mg/dL
Leukocytes, UA: NEGATIVE
Nitrite, UA: NEGATIVE
Protein Ur, POC: NEGATIVE mg/dL
Specific Gravity, Urine: 1.01
Urobilinogen, Ur: 0.2
pH, UA: 6 (ref 5.0–8.0)

## 2023-11-06 MED ORDER — BUDESONIDE-FORMOTEROL FUMARATE 160-4.5 MCG/ACT IN AERO: 2.0000 | INHALATION_SPRAY | Freq: Two times a day (BID) | RESPIRATORY_TRACT | 11 refills | Status: DC

## 2023-11-06 MED ORDER — SYNTHROID 50 MCG PO TABS: 50.0000 ug | ORAL_TABLET | Freq: Every day | ORAL | 1 refills | Status: DC

## 2023-11-06 NOTE — Progress Notes (Signed)
 Subjective: Chief Complaint  Patient presents with   Annual Exam    Non fasting. An hour ago: protein shake and granola bar   Urinary Frequency    Urgency. A couple weeks.     Patient Care Team: [name], Alm RAMAN, PA-C as PCP - General (Family Medicine) [name], MD (Neurology) [name], MD as Referring Physician (Dermatology) Harvey Seltzer, MD as Consulting Physician (Sports Medicine) Tobie Lady POUR, MD as Consulting Physician (Rheumatology) [name], MD as Referring Physician (Allergy  and Immunology) Devenyi, Zoltan J, MD as Referring Physician (Gastroenterology) Dr. Montie Levering, gyn in [facility] [address] [place], [place]    Concerns: She notes urinary urgency x a few weeks.  No odor in urine, no blood in urine, no burning.  Not necessarily infection feeling but bladder sensitivity.   No vaginal discharge.  No concern for STD.  Just saw OB/gyn recently, had negative STD eval.  Is continuing on Nuvaring consistently to help with migraines  Saw neurology recently, Dr. Ines, was switched to Ajovy  instead of Aimovig .   Got approved for botox  but wants to hold off on botox  for now.  Getting over bronchitis, had it around Christmas.   Last medication for cough last week.    Compliant medications as usual.  No other specific complaints  No other aggravating or relieving factors. No other complaint.   Past Medical History:  Diagnosis Date   Allergy     Anemia    IV iron in past, prior to 2018   Asthma    Celiac disease    +prior biopsy   GERD (gastroesophageal reflux disease)    H/O bone density study 12/21/2017   normal   Hip fracture (HCC) 2008   right, Dr. Harvey   History of stress test 2014   [facility]   Hypothyroidism    Migraine headache without aura    gets bad nausea.  does well on Naratriptan  and Relpax.  Failed Imitrex , Maxalt    Stress fracture    severeral prior   Transfusion history    has had IV iron prior, Dr.  Timmy prior management    Past Surgical History:  Procedure Laterality Date   COLONOSCOPY  2015   [facility], advised to repeat q5 years; x 4 prior   ESOPHAGOGASTRODUODENOSCOPY     x5 prior as of 2016   INGUINAL HERNIA REPAIR     left   NASAL SEPTUM SURGERY     POLYPECTOMY     sigmoid colon   SINOSCOPY     WISDOM TOOTH EXTRACTION      Social History   Socioeconomic History   Marital status: Single    Spouse name: Not on file   Number of children: Not on file   Years of education: Not on file   Highest education level: Doctorate  Occupational History   Not on file  Tobacco Use   Smoking status: Never   Smokeless tobacco: Never  Vaping Use   Vaping status: Never Used  Substance and Sexual Activity   Alcohol use: Yes    Alcohol/week: 2.0 standard drinks of alcohol    Types: 1 Glasses of wine, 1 Shots of liquor per week    Comment: rare   Drug use: No   Sexual activity: Not on file  Other Topics Concern   Not on file  Social History Narrative   Lives alone, 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  Prior worked as professor at [employer].  Exercise:- 6-7 days per week, teaches spin at [employer], swam competitively in college, marathon runner.        Pulmonary:   Originally from [place]. Has also lived in [place], [place], & [place].  Currently working in counseling. No pets currently. No bird exposure. No mold or hot tub exposure. Currently has carpet in the bedroom. No draperies. No exposure to asbestos.      Social Drivers of Corporate Investment Banker Strain: Low Risk  (11/06/2023)   Overall Financial Resource Strain (CARDIA)    Difficulty of Paying Living Expenses: Not very hard  Food Insecurity: Food Insecurity Present (11/06/2023)   Hunger Vital Sign    Worried About Running Out of Food in the Last Year: Sometimes true    Ran Out of Food in the Last Year: Never true   Transportation Needs: No Transportation Needs (11/06/2023)   PRAPARE - Administrator, Civil Service (Medical): No    Lack of Transportation (Non-Medical): No  Physical Activity: Sufficiently Active (11/06/2023)   Exercise Vital Sign    Days of Exercise per Week: 5 days    Minutes of Exercise per Session: 60 min  Stress: Stress Concern Present (11/06/2023)   [employer] of Occupational Health - Occupational Stress Questionnaire    Feeling of Stress : To some extent  Social Connections: Unknown (11/06/2023)   Social Connection and Isolation Panel [NHANES]    Frequency of Communication with Friends and Family: Once a week    Frequency of Social Gatherings with Friends and Family: Not on file    Attends Religious Services: Never    Database Administrator or Organizations: No    Attends Engineer, Structural: Not on file    Marital Status: Never married  Catering Manager Violence: Not on file    Family History  Problem Relation Age of Onset   Hypertension Mother    Osteopenia Mother    Raynaud syndrome Mother    Celiac disease Mother    Cancer Father        asbestos related lung cancer   Heart disease Father 69       Stent   Pulmonary embolism Father    Celiac disease Brother    Celiac disease Brother    Stroke Maternal Grandmother    COPD Maternal Grandmother    Osteopenia Maternal Grandmother    Heart disease Maternal Grandfather    Stroke Maternal Grandfather    Heart disease Paternal Grandfather        died 95yo   Diabetes Neg Hx      Current Outpatient Medications:    buPROPion  (WELLBUTRIN  XL) 150 MG 24 hr tablet, TAKE 1 TABLET BY MOUTH EVERY DAY, Disp: 90 tablet, Rfl: 1   diclofenac  (FLECTOR ) 1.3 % PTCH, Place 1 patch onto the skin 2 (two) times daily., Disp: 60 patch, Rfl: 1   etonogestrel -ethinyl estradiol  (NUVARING) 0.12-0.015 MG/24HR vaginal ring, Place 1 each vaginally every 28 (twenty-eight) days. Insert vaginally and leave in place for 3  consecutive weeks, then remove for 1 week., Disp: 1 each, Rfl: 0   famotidine  (PEPCID ) 20 MG tablet, Take by mouth 2 (two) times daily., Disp: , Rfl:    FeAspGl-FeFum-B12-FA-C-Succ Ac (FERREX 28) MISC, Take 1 tablet by mouth daily., Disp: 90 each, Rfl: 3   Fremanezumab -vfrm (AJOVY ) 225 MG/1.5ML SOAJ, Inject 225 mg into the skin every 30 (thirty) days. Please run copay card: BIN [id number] PCN  PDMI GRP [id number] ID [phone], Disp: 1.5 mL, Rfl: 11   Homeopathic Products (ZINC ) LOZG, Use as directed in the mouth or throat., Disp: , Rfl:    hydroquinone 4 % cream, , Disp: , Rfl:    MAGNESIUM GLUCONATE PO, Take 400 mg by mouth daily., Disp: , Rfl:    naratriptan  (AMERGE) 2.5 MG tablet, TAKE 1 TABLET AT ONSET OF HEADACHE MAY REPEAT IN 4 HRS IF NOT RESOLVED DO NOT EXCEED 5 MG IN 24 HRS, Disp: 18 tablet, Rfl: 0   ondansetron  (ZOFRAN -ODT) 4 MG disintegrating tablet, DISSOLVE 1 TABLET ON THE TONGUE EVERY 8 HOURS AS NEEDED FOR NAUSEA AND VOMITING, Disp: 90 tablet, Rfl: 0   RIBOFLAVIN PO, Take 400 mg by mouth daily., Disp: , Rfl:    tretinoin (RETIN-A) 0.05 % cream, SMARTSIG:1 Sparingly Topical Daily, Disp: , Rfl:    triamcinolone (KENALOG) 0.1 %, Apply topically., Disp: , Rfl:    UNABLE TO FIND, Med Name: Pro air inhaler HFA, Disp: , Rfl:    vitamin C (ASCORBIC ACID) 500 MG tablet, Take 500 mg by mouth daily., Disp: , Rfl:    albuterol  (VENTOLIN  HFA) 108 (90 Base) MCG/ACT inhaler, TAKE 2 PUFFS BY MOUTH EVERY 6 HOURS AS NEEDED FOR WHEEZE OR SHORTNESS OF BREATH (Patient not taking: Reported on 11/06/2023), Disp: 8.5 each, Rfl: 0   botulinum toxin Type A  (BOTOX ) 200 units injection, Provider to inject 155 units into the muscles of the head and neck every 12 weeks. Discard remainder., Disp: 1 each, Rfl: 1   budesonide -formoterol  (SYMBICORT ) 160-4.5 MCG/ACT inhaler, Inhale 2 puffs into the lungs 2 (two) times daily. in the morning and at bedtime., Disp: 10.2 each, Rfl: 11   cyclobenzaprine  (FLEXERIL ) 5 MG tablet, TAKE 1  TABLET BY MOUTH EVERYDAY AT BEDTIME (Patient not taking: Reported on 11/06/2023), Disp: 30 tablet, Rfl: 2   rizatriptan  (MAXALT ) 10 MG tablet, Take 1 tablet (10 mg total) by mouth once as needed for migraine. May repeat in 2 hours if needed (Patient not taking: Reported on 11/06/2023), Disp: 10 tablet, Rfl: 2   SYNTHROID  50 MCG tablet, Take 1 tablet (50 mcg total) by mouth daily., Disp: 90 tablet, Rfl: 1   Ubrogepant  (UBRELVY  PO), Take by mouth daily as needed. (Patient not taking: Reported on 11/06/2023), Disp: , Rfl:    valACYclovir  (VALTREX ) 500 MG tablet, TAKE 1 TABLET BY MOUTH EVERY DAY **MYLAN BRAND** (Patient not taking: Reported on 11/06/2023), Disp: 90 tablet, Rfl: 0  Allergies  Allergen Reactions   Sulfa Antibiotics Anaphylaxis   Montelukast  Other (See Comments)    Angry, irritable, out of skin feeling   Other Hives    Dark alcohol   Singulair  [Montelukast  Sodium]     Angry, irritable, out of skin feeling    Reviewed their medical, surgical, family, social, medication, and allergy  history and updated chart as appropriate.   Review of Systems  Constitutional:  Negative for chills, fever, malaise/fatigue and weight loss.  HENT:  Negative for congestion, ear pain, hearing loss, sore throat and tinnitus.   Eyes:  Negative for blurred vision, pain and redness.  Respiratory:  Negative for cough, hemoptysis and shortness of breath.   Cardiovascular:  Negative for chest pain, palpitations, orthopnea, claudication and leg swelling.  Gastrointestinal:  Negative for abdominal pain, blood in stool, constipation, diarrhea, nausea and vomiting.  Genitourinary:  Positive for urgency. Negative for dysuria, flank pain, frequency and hematuria.  Musculoskeletal:  Negative for falls, joint pain and myalgias.  Skin:  Negative for  itching and rash.  Neurological:  Negative for dizziness, tingling, speech change, weakness and headaches.  Endo/Heme/Allergies:  Negative for polydipsia. Does not bruise/bleed  easily.  Psychiatric/Behavioral:  Negative for depression and memory loss. The patient is not nervous/anxious and does not have insomnia.         Objective:  BP 118/80   Pulse 92   Ht 5' 7 (1.702 m)   Wt 157 lb 12.8 oz (71.6 kg)   SpO2 99%   BMI 24.71 kg/m     Wt Readings from Last 3 Encounters:  11/06/23 157 lb 12.8 oz (71.6 kg)  08/14/23 154 lb (69.9 kg)  07/13/23 151 lb (68.5 kg)   General appearence: alert, no distress, WD/WN,  HEENT: normocephalic, sclerae anicteric, PERRLA, EOMi, nares patent, no discharge or erythema, pharynx normal Oral cavity: MMM, no lesions Neck: supple, no lymphadenopathy, no thyromegaly, no masses Heart: RRR, normal S1, S2, no murmurs Lungs: CTA bilaterally, no wheezes, rhonchi, or rales Abdomen: +bs, soft, non tender, non distended, no masses, no hepatomegaly, no splenomegaly Back: non tender Musculoskeletal: nontender, no swelling, no obvious deformity Extremities: no edema, no cyanosis, no clubbing Pulses: 2+ symmetric, upper and lower extremities, normal cap refill Neurological: alert, oriented x 3, CN2-12 intact, strength normal upper extremities and lower extremities, sensation normal throughout, DTRs 2+ throughout, no cerebellar signs, gait normal Psychiatric: normal affect, behavior normal, pleasant  Breast/GYN-deferred to gynecology   Assessment and Plan :   Encounter Diagnoses  Name Primary?   Encounter for health maintenance examination in adult Yes   Vitamin D  deficiency    Vaccine counseling    Screening for lipid disorders    Moderate persistent asthma, unspecified whether complicated    Iron deficiency anemia, unspecified iron deficiency anemia type    Hypothyroidism, unspecified type    Chronic fatigue    Gastroesophageal reflux disease, unspecified whether esophagitis present    Allergic rhinitis due to pollen, unspecified seasonality    Celiac disease    Chronic migraine without aura without status migrainosus, not  intractable    Chronic nausea    Raynaud's phenomenon without gangrene    Hx of colonic polyp     This visit was a preventative care visit, also known as wellness visit or routine physical.   Topics typically include healthy lifestyle, diet, exercise, preventative care, vaccinations, sick and well care, proper use of emergency dept and after hours care, as well as other concerns.     Recommendations: Continue to return yearly for your annual wellness and preventative care visits.  This gives us  a chance to discuss healthy lifestyle, exercise, vaccinations, review your chart record, and perform screenings where appropriate.  I recommend you see your eye doctor yearly for routine vision care.  I recommend you see your dentist yearly for routine dental care including hygiene visits twice yearly.  See your gynecologist yearly for routine gynecological care.   Vaccination recommendations were reviewed Immunization History  Administered Date(s) Administered   HPV Quadrivalent 10/31/2006, 10/31/2006   Influenza Split 09/17/2012, 07/26/2013   Influenza,inj,Quad PF,6+ Mos 11/24/2016, 10/28/2018, 09/24/2019, 09/15/2020, 07/28/2022   Influenza-Unspecified 10/04/2015, 09/04/2023   PFIZER(Purple Top)SARS-COV-2 Vaccination 11/20/2019, 12/11/2019, 06/21/2020   Pneumococcal Polysaccharide-23 03/24/2022   Tdap 09/15/2020      Screening for cancer: Colon cancer screening: I reviewed your colonoscopy on file that is up to date from 2020.  Due repeat 2025.  Breast cancer screening: You should perform a self breast exam monthly.  Up to date per gyn  Cervical  cancer screening: We reviewed recommendations for pap smear screening. Up to date per gyn   Skin cancer screening: Check your skin regularly for new changes, growing lesions, or other lesions of concern Come in for evaluation if you have skin lesions of concern.  Lung cancer screening: If you have a greater than 20 pack year history of  tobacco use, then you may qualify for lung cancer screening with a chest CT scan.   Please call your insurance company to inquire about coverage for this test.  We currently don't have screenings for other cancers besides breast, cervical, colon, and lung cancers.  If you have a strong family history of cancer or have other cancer screening concerns, please let me know.    Bone health: Get at least 150 minutes of aerobic exercise weekly Get weight bearing exercise at least once weekly Bone density test:  A bone density test is an imaging test that uses a type of X-ray to measure the amount of calcium  and other minerals in your bones. The test may be used to diagnose or screen you for a condition that causes weak or thin bones (osteoporosis), predict your risk for a broken bone (fracture), or determine how well your osteoporosis treatment is working. The bone density test is recommended for females 65 and older, or females or males <65 if certain risk factors such as thyroid  disease, long term use of steroids such as for asthma or rheumatological issues, vitamin D  deficiency, estrogen deficiency, family history of osteoporosis, self or family history of fragility fracture in first degree relative.  I reviewed your bone density scan showing osteopenia from 2020 in the chart record.    Heart health: Get at least 150 minutes of aerobic exercise weekly Limit alcohol It is important to maintain a healthy blood pressure and healthy cholesterol numbers  Heart disease screening: Screening for heart disease includes screening for blood pressure, fasting lipids, glucose/diabetes screening, BMI height to weight ratio, reviewed of smoking status, physical activity, and diet.    Goals include blood pressure 120/80 or less, maintaining a healthy lipid/cholesterol profile, preventing diabetes or keeping diabetes numbers under good control, not smoking or using tobacco products, exercising most days per week  or at least 150 minutes per week of exercise, and eating healthy variety of fruits and vegetables, healthy oils, and avoiding unhealthy food choices like fried food, fast food, high sugar and high cholesterol foods.    Other tests may possibly include EKG test, CT coronary calcium  score, echocardiogram, exercise treadmill stress test.   Saw cardiology late 2022, holter testing with mostly NSR, and no major concerns.    Medical care options: I recommend you continue to seek care here first for routine care.  We try really hard to have available appointments Monday through Friday daytime hours for sick visits, acute visits, and physicals.  Urgent care should be used for after hours and weekends for significant issues that cannot wait till the next day.  The emergency department should be used for significant potentially life-threatening emergencies.  The emergency department is expensive, can often have long wait times for less significant concerns, so try to utilize primary care, urgent care, or telemedicine when possible to avoid unnecessary trips to the emergency department.  Virtual visits and telemedicine have been introduced since the pandemic started in 2020, and can be convenient ways to receive medical care.  We offer virtual appointments as well to assist you in a variety of options to seek medical care.  Advanced Directives: I recommend you consider completing a Health Care Power of Attorney and Living Will.   These documents respect your wishes and help alleviate burdens on your loved ones if you were to become terminally ill or be in a position to need those documents enforced.    You can complete Advanced Directives yourself, have them notarized, then have copies made for our office, for you and for anybody you feel should have them in safe keeping.  Or, you can have an attorney prepare these documents.   If you haven't updated your Last Will and Testament in a while, it may be  worthwhile having an attorney prepare these documents together and save on some costs.        Separate significant issues discussed: Migraines-doing better on Ajovy  compared to prior Aimovig .  Recently established with neurology.  Declines Botox  currently.  Asthma -doing okay on current therapy.  Using Symbicort  for prevention, albuterol  as needed  Urinary urgency-urinalysis normal.  Could be caffeine  related.  We discussed follow-up with urology if symptoms persist.  She declines urine culture today.  Hypothyroid -compliant with medicine.  Labs today  Anemia -updated labs today I got  Anxiety - Doing fine on Wellbutrin .     Hypothyroidism - compliant with Synthroid , labs today.  History of celiac disease-diet controlled, diet has been gluten free since 2013    Ritu was seen today for annual exam and urinary frequency.  Diagnoses and all orders for this visit:  Encounter for health maintenance examination in adult -     Comprehensive metabolic panel -     TSH + free T4 -     POCT Urinalysis DIP (Proadvantage Device) -     Lipid Panel; Future -     Iron, TIBC and Ferritin Panel -     CBC with Differential/Platelet  Vitamin D  deficiency  Vaccine counseling  Screening for lipid disorders -     Lipid Panel; Future  Moderate persistent asthma, unspecified whether complicated  Iron deficiency anemia, unspecified iron deficiency anemia type -     Iron, TIBC and Ferritin Panel -     CBC with Differential/Platelet  Hypothyroidism, unspecified type -     TSH + free T4  Chronic fatigue  Gastroesophageal reflux disease, unspecified whether esophagitis present  Allergic rhinitis due to pollen, unspecified seasonality  Celiac disease  Chronic migraine without aura without status migrainosus, not intractable  Chronic nausea  Raynaud's phenomenon without gangrene  Hx of colonic polyp  Other orders -     budesonide -formoterol  (SYMBICORT ) 160-4.5 MCG/ACT inhaler;  Inhale 2 puffs into the lungs 2 (two) times daily. in the morning and at bedtime. -     SYNTHROID  50 MCG tablet; Take 1 tablet (50 mcg total) by mouth daily.      Follow-up pending labs, yearly for physical

## 2023-11-07 ENCOUNTER — Other Ambulatory Visit: Payer: Self-pay | Admitting: Medical

## 2023-11-07 LAB — CBC WITH DIFFERENTIAL/PLATELET
Basophils Absolute: 0.1 10*3/uL (ref 0.0–0.2)
Basos: 2 %
EOS (ABSOLUTE): 0.3 10*3/uL (ref 0.0–0.4)
Eos: 5 %
Hematocrit: 45.3 % (ref 34.0–46.6)
Hemoglobin: 15.2 g/dL (ref 11.1–15.9)
Immature Grans (Abs): 0 10*3/uL (ref 0.0–0.1)
Immature Granulocytes: 0 %
Lymphocytes Absolute: 1.3 10*3/uL (ref 0.7–3.1)
Lymphs: 21 %
MCH: 32.3 pg (ref 26.6–33.0)
MCHC: 33.6 g/dL (ref 31.5–35.7)
MCV: 96 fL (ref 79–97)
Monocytes Absolute: 0.4 10*3/uL (ref 0.1–0.9)
Monocytes: 7 %
Neutrophils Absolute: 3.8 10*3/uL (ref 1.4–7.0)
Neutrophils: 65 %
Platelets: 281 10*3/uL (ref 150–450)
RBC: 4.7 x10E6/uL (ref 3.77–5.28)
RDW: 11.9 % (ref 11.7–15.4)
WBC: 5.9 10*3/uL (ref 3.4–10.8)

## 2023-11-07 LAB — TSH+FREE T4
Free T4: 1.39 ng/dL (ref 0.82–1.77)
TSH: 1.67 u[IU]/mL (ref 0.450–4.500)

## 2023-11-07 LAB — COMPREHENSIVE METABOLIC PANEL
ALT: 11 [IU]/L (ref 0–32)
AST: 19 [IU]/L (ref 0–40)
Albumin: 4.3 g/dL (ref 3.9–4.9)
Alkaline Phosphatase: 43 [IU]/L — ABNORMAL LOW (ref 44–121)
BUN/Creatinine Ratio: 11 (ref 9–23)
BUN: 12 mg/dL (ref 6–24)
Bilirubin Total: 0.4 mg/dL (ref 0.0–1.2)
CO2: 23 mmol/L (ref 20–29)
Calcium: 9.9 mg/dL (ref 8.7–10.2)
Chloride: 99 mmol/L (ref 96–106)
Creatinine, Ser: 1.05 mg/dL — ABNORMAL HIGH (ref 0.57–1.00)
Globulin, Total: 2.5 g/dL (ref 1.5–4.5)
Glucose: 86 mg/dL (ref 70–99)
Potassium: 4.3 mmol/L (ref 3.5–5.2)
Sodium: 135 mmol/L (ref 134–144)
Total Protein: 6.8 g/dL (ref 6.0–8.5)
eGFR: 65 mL/min/{1.73_m2} (ref >59)

## 2023-11-07 LAB — IRON,TIBC AND FERRITIN PANEL
Ferritin: 40 ng/mL (ref 15–150)
Iron Saturation: 25 % (ref 15–55)
Iron: 91 ug/dL (ref 27–159)
Total Iron Binding Capacity: 371 ug/dL (ref 250–450)
UIBC: 280 ug/dL (ref 131–425)

## 2023-11-07 NOTE — Progress Notes (Signed)
 Results sent through MyChart

## 2023-11-08 NOTE — Progress Notes (Signed)
 Results sent through MyChart

## 2023-11-29 ENCOUNTER — Other Ambulatory Visit: Payer: Self-pay | Admitting: Medical

## 2023-11-29 ENCOUNTER — Other Ambulatory Visit: Payer: BC Managed Care – PPO

## 2023-11-29 ENCOUNTER — Other Ambulatory Visit: Payer: Self-pay | Admitting: Family Medicine

## 2023-11-29 DIAGNOSIS — R1084 Generalized abdominal pain: Secondary | ICD-10-CM

## 2023-11-29 DIAGNOSIS — M545 Low back pain, unspecified: Secondary | ICD-10-CM | POA: Diagnosis not present

## 2023-11-29 DIAGNOSIS — R160 Hepatomegaly, not elsewhere classified: Secondary | ICD-10-CM | POA: Diagnosis not present

## 2023-11-29 DIAGNOSIS — R1032 Left lower quadrant pain: Secondary | ICD-10-CM | POA: Diagnosis not present

## 2023-11-29 LAB — VITAMIN D 25 HYDROXY (VIT D DEFICIENCY, FRACTURES): Vit D, 25-Hydroxy: 50.2 ng/mL (ref 30.0–100.0)

## 2023-11-29 LAB — SPECIMEN STATUS REPORT

## 2023-11-30 ENCOUNTER — Telehealth: Payer: BC Managed Care – PPO | Admitting: Medical

## 2023-11-30 ENCOUNTER — Encounter: Payer: Self-pay | Admitting: Medical

## 2023-11-30 VITALS — BP 142/100 | HR 124 | Temp 98.4°F | Wt 149.0 lb

## 2023-11-30 DIAGNOSIS — M545 Low back pain, unspecified: Secondary | ICD-10-CM | POA: Diagnosis not present

## 2023-11-30 DIAGNOSIS — R1032 Left lower quadrant pain: Secondary | ICD-10-CM

## 2023-11-30 DIAGNOSIS — R195 Other fecal abnormalities: Secondary | ICD-10-CM | POA: Diagnosis not present

## 2023-11-30 DIAGNOSIS — Z8719 Personal history of other diseases of the digestive system: Secondary | ICD-10-CM | POA: Diagnosis not present

## 2023-11-30 DIAGNOSIS — Z1211 Encounter for screening for malignant neoplasm of colon: Secondary | ICD-10-CM

## 2023-11-30 MED ORDER — AMOXICILLIN-POT CLAVULANATE 875-125 MG PO TABS: 1.0000 | ORAL_TABLET | Freq: Two times a day (BID) | ORAL | 0 refills | Status: DC

## 2023-11-30 MED ORDER — OXYCODONE-ACETAMINOPHEN 5-325 MG PO TABS: 1.0000 | ORAL_TABLET | Freq: Four times a day (QID) | ORAL | 0 refills | Status: AC | PRN

## 2023-11-30 NOTE — Progress Notes (Signed)
Subjective:     Patient ID: Barbara Odonnell, female   DOB: [birth year], 48 y.o.   MRN: [id number]  This visit type was conducted due to national recommendations for restrictions regarding the COVID-19 Pandemic (e.g. social distancing) in an effort to limit this patient's exposure and mitigate transmission in our community.  Due to their co-morbid illnesses, this patient is at least at moderate risk for complications without adequate follow up.  This format is felt to be most appropriate for this patient at this time.    Documentation for virtual audio and video telecommunications through [place] encounter:  The patient was located at home. The provider was located in the office. The patient did consent to this visit and is aware of possible charges through their insurance for this visit.  The other persons participating in this telemedicine service were none. Time spent on call was 20 minutes and in review of previous records 25 minutes total.  This virtual service is not related to other E/M service within previous 7 days.   HPI Chief Complaint  Patient presents with   other    CT scan yesterday no read may have diverticulitis, went to [facility] yesterday, a lot of pain started Tuesday tested positive for the flu on Friday, low back pain lt. Abdominal pain   Virtual for concern for abdominal pain.  She was not able to come in for in person visit  2 days ago she started having quite significant low back pain and lower abdominal pain on the left.  She thought it was her kidneys.  She made an appointment with urology and was able to get in with them yesterday.  They ordered a CT scan.  She had evaluation but they felt like it was probably not related to her kidneys or kidney stone.  They were concerned for diverticulitis.  She notes still ongoing low back pain across the low back left to the right and left lower quadrant abdominal pain specifically.  No current fever, body aches or  chills otherwise, no urinary changes, no blood in the urine, no blood in the stool, no vaginal discharge, no pelvic pain otherwise.  No pain with intercourse.  She has been having some constipation and her bowels have definitely been little off in recent days and weeks.  She did have a little loose stool last night after taking a stool softener.  She denies bloody diarrhea however.  She has no history of diverticulitis.  But she does have a history of colitis in the remote past  She has also been dealing with some respiratory issues.  She had influenza in the past week with his better from that now.  She was seen with the urgent care for that issue  She was in [place] recently had a bout of bronchitis and asthma flare.  She has been on 2 recent doses of steroids in the past month  She does get some improvement in her back if she is lying flat and using a heat pad.  Her blood pressure and pulse have been elevated recently with this pain  She did not have a menstrual period that she is using the NuvaRing nonstop.  No history of ovarian cyst or uterine lesions.  No other aggravating or relieving factors. No other complaint.   Past Medical History:  Diagnosis Date   Allergy    Anemia    IV iron in past, prior to 2018   Asthma    Celiac disease    +  prior biopsy   GERD (gastroesophageal reflux disease)    H/O bone density study 12/21/2017   normal   Hip fracture [facility]) 2008   right, Dr. Darrick Penna   History of stress test 2014   [facility]   Hypothyroidism    Migraine headache without aura    gets bad nausea.  does well on Naratriptan and Relpax.  Failed Imitrex, Maxalt   Stress fracture    severeral prior   Transfusion history    has had IV iron prior, Dr. Myna Hidalgo prior management   Current Outpatient Medications on File Prior to Visit  Medication Sig Dispense Refill   albuterol (VENTOLIN HFA) 108 (90 Base) MCG/ACT inhaler TAKE 2 PUFFS BY MOUTH EVERY 6 HOURS AS NEEDED FOR WHEEZE OR  SHORTNESS OF BREATH 8.5 each 1   budesonide-formoterol (SYMBICORT) 160-4.5 MCG/ACT inhaler Inhale 2 puffs into the lungs 2 (two) times daily. in the morning and at bedtime. 10.2 each 11   buPROPion (WELLBUTRIN XL) 150 MG 24 hr tablet TAKE 1 TABLET BY MOUTH EVERY DAY 90 tablet 1   cyclobenzaprine (FLEXERIL) 5 MG tablet TAKE 1 TABLET BY MOUTH EVERYDAY AT BEDTIME 30 tablet 2   diclofenac (FLECTOR) 1.3 % PTCH Place 1 patch onto the skin 2 (two) times daily. 60 patch 1   etonogestrel-ethinyl estradiol (NUVARING) 0.12-0.015 MG/24HR vaginal ring Place 1 each vaginally every 28 (twenty-eight) days. Insert vaginally and leave in place for 3 consecutive weeks, then remove for 1 week. 1 each 0   famotidine (PEPCID) 20 MG tablet Take by mouth 2 (two) times daily.     Fremanezumab-vfrm (AJOVY) 225 MG/1.5ML SOAJ Inject 225 mg into the skin every 30 (thirty) days. Please run copay card: BIN [id number] PCN [id number] GRP [id number] ID [id number] 1.5 mL 11   Homeopathic Products (ZINC) LOZG Use as directed in the mouth or throat.     hydroquinone 4 % cream      MAGNESIUM GLUCONATE PO Take 400 mg by mouth daily.     naratriptan (AMERGE) 2.5 MG tablet TAKE 1 TABLET AT ONSET OF HEADACHE MAY REPEAT IN 4 HRS IF NOT RESOLVED DO NOT EXCEED 5 MG IN 24 HRS 18 tablet 0   ondansetron (ZOFRAN-ODT) 4 MG disintegrating tablet DISSOLVE 1 TABLET ON THE TONGUE EVERY 8 HOURS AS NEEDED FOR NAUSEA AND VOMITING 90 tablet 0   RIBOFLAVIN PO Take 400 mg by mouth daily.     rizatriptan (MAXALT) 10 MG tablet Take 1 tablet (10 mg total) by mouth once as needed for migraine. May repeat in 2 hours if needed 10 tablet 2   SYNTHROID 50 MCG tablet Take 1 tablet (50 mcg total) by mouth daily. 90 tablet 1   tretinoin (RETIN-A) 0.05 % cream SMARTSIG:1 Sparingly Topical Daily     triamcinolone (KENALOG) 0.1 % Apply topically.     UNABLE TO FIND Med Name: Pro air inhaler HFA     valACYclovir (VALTREX) 500 MG tablet TAKE 1 TABLET BY MOUTH EVERY DAY **MYLAN  BRAND** 90 tablet 0   vitamin C (ASCORBIC ACID) 500 MG tablet Take 500 mg by mouth daily.     botulinum toxin Type A (BOTOX) 200 units injection Provider to inject 155 units into the muscles of the head and neck every 12 weeks. Discard remainder. 1 each 1   FeAspGl-FeFum-B12-FA-C-Succ Ac (FERREX 28) MISC Take 1 tablet by mouth daily. 90 each 3   Ubrogepant (UBRELVY PO) Take by mouth daily as needed. (Patient not taking: Reported on 11/30/2023)  No current facility-administered medications on file prior to visit.    Past Surgical History:  Procedure Laterality Date   COLONOSCOPY  2015   [facility], advised to repeat q5 years; x 4 prior   ESOPHAGOGASTRODUODENOSCOPY     x5 prior as of 2016   INGUINAL HERNIA REPAIR     left   NASAL SEPTUM SURGERY     POLYPECTOMY     sigmoid colon   SINOSCOPY     WISDOM TOOTH EXTRACTION       Review of Systems As in subjective    Objective:   Physical Exam Due to coronavirus pandemic stay at home measures, patient visit was virtual and they were not examined in person.   BP (!) 142/100   Pulse (!) 124   Temp 98.4 F (36.9 C) (Oral)   Wt 149 lb (67.6 kg)   SpO2 98%   BMI 23.34 kg/m   Gen: wd, wn nad She notes pain in LLQ pain      Assessment:     Encounter Diagnoses  Name Primary?   LLQ pain Yes   Acute bilateral low back pain without sciatica    Change in consistency of stool    History of colitis    Screen for colon cancer        Plan:     We discussed her symptoms and concerns.  I reviewed back over her 2020 colonoscopy that did not show diverticulosis.  I called urology and they verbally told me that her urinalysis yesterday was normal and apparently Dr. Dillard Essex initial interpretation of the scan was no hydronephrosis or kidney stone but he could not really comment on anything else.  Thus we are awaiting on CT scan results from yesterday's abdominal scan.  We discussed differential which could include muscle strain from  all the coughing she has been doing, GU infection, urinary tract infection, diverticulitis, colitis, pelvic lesion or other.  Likely not urinary tract infection given the normal urinalysis yesterday and no significant urinary symptoms otherwise.  There is a possibility of diverticulitis but probably more likely possibility of colitis.  She can use pain medication sent to the pharmacy today as needed for worse pain.  We will await the CT scan results which will hopefully be back today or tomorrow.  This was done through [facility].  I also advised her to consider coming up and getting stool kit to do stool samples in the event she starts getting loose stool and diarrhea which she is currently not having.  If she does start to get those symptoms we would need those results to help dictate treatment for colitis  We discussed the limitations of virtual consult  We will possibly use Augmentin for infection if symptoms progress unless she is having fever and not bloody diarrhea isolated  Advised if any new or worse symptoms in the next 2 days recheck, call or go to the emergency department  [place] was seen today for other.  Diagnoses and all orders for this visit:  LLQ pain -     Clostridium difficile EIA -     GI Profile, Stool, PCR -     Ambulatory referral to Gastroenterology  Acute bilateral low back pain without sciatica -     Clostridium difficile EIA -     GI Profile, Stool, PCR  Change in consistency of stool -     Clostridium difficile EIA -     GI Profile, Stool, PCR  History of colitis -  Ambulatory referral to Gastroenterology  Screen for colon cancer -     Ambulatory referral to Gastroenterology  Other orders -     oxyCODONE-acetaminophen (PERCOCET/ROXICET) 5-325 MG tablet; Take 1 tablet by mouth every 6 (six) hours as needed for up to 5 days. -     amoxicillin-clavulanate (AUGMENTIN) 875-125 MG tablet; Take 1 tablet by mouth 2 (two) times daily.    F/u  pending scan results

## 2023-12-01 ENCOUNTER — Inpatient Hospital Stay (HOSPITAL_BASED_OUTPATIENT_CLINIC_OR_DEPARTMENT_OTHER)
Admission: EM | Admit: 2023-12-01 | Discharge: 2023-12-05 | DRG: 299 | Disposition: A | Discharge status: Home / Self Care | Payer: BC Managed Care – PPO | Attending: Internal Medicine | Admitting: Internal Medicine

## 2023-12-01 ENCOUNTER — Emergency Department (HOSPITAL_BASED_OUTPATIENT_CLINIC_OR_DEPARTMENT_OTHER): Payer: BC Managed Care – PPO

## 2023-12-01 ENCOUNTER — Ambulatory Visit: Payer: BC Managed Care – PPO | Admitting: Medical

## 2023-12-01 ENCOUNTER — Other Ambulatory Visit: Payer: Self-pay

## 2023-12-01 ENCOUNTER — Encounter (HOSPITAL_BASED_OUTPATIENT_CLINIC_OR_DEPARTMENT_OTHER): Payer: Self-pay

## 2023-12-01 ENCOUNTER — Other Ambulatory Visit: Payer: Self-pay | Admitting: Medical

## 2023-12-01 DIAGNOSIS — Q438 Other specified congenital malformations of intestine: Secondary | ICD-10-CM | POA: Diagnosis not present

## 2023-12-01 DIAGNOSIS — Z888 Allergy status to other drugs, medicaments and biological substances status: Secondary | ICD-10-CM | POA: Diagnosis not present

## 2023-12-01 DIAGNOSIS — R1032 Left lower quadrant pain: Secondary | ICD-10-CM | POA: Diagnosis not present

## 2023-12-01 DIAGNOSIS — Z825 Family history of asthma and other chronic lower respiratory diseases: Secondary | ICD-10-CM | POA: Diagnosis not present

## 2023-12-01 DIAGNOSIS — J45909 Unspecified asthma, uncomplicated: Secondary | ICD-10-CM | POA: Diagnosis not present

## 2023-12-01 DIAGNOSIS — Z823 Family history of stroke: Secondary | ICD-10-CM | POA: Diagnosis not present

## 2023-12-01 DIAGNOSIS — I160 Hypertensive urgency: Secondary | ICD-10-CM | POA: Diagnosis not present

## 2023-12-01 DIAGNOSIS — E871 Hypo-osmolality and hyponatremia: Secondary | ICD-10-CM | POA: Diagnosis not present

## 2023-12-01 DIAGNOSIS — K900 Celiac disease: Secondary | ICD-10-CM | POA: Diagnosis not present

## 2023-12-01 DIAGNOSIS — Z79899 Other long term (current) drug therapy: Secondary | ICD-10-CM | POA: Diagnosis not present

## 2023-12-01 DIAGNOSIS — K55069 Acute infarction of intestine, part and extent unspecified: Secondary | ICD-10-CM | POA: Diagnosis not present

## 2023-12-01 DIAGNOSIS — I7779 Dissection of other artery: Principal | ICD-10-CM | POA: Diagnosis present

## 2023-12-01 DIAGNOSIS — Z8249 Family history of ischemic heart disease and other diseases of the circulatory system: Secondary | ICD-10-CM

## 2023-12-01 DIAGNOSIS — I10 Essential (primary) hypertension: Secondary | ICD-10-CM | POA: Diagnosis not present

## 2023-12-01 DIAGNOSIS — Z860100 Personal history of colon polyps, unspecified: Secondary | ICD-10-CM | POA: Diagnosis not present

## 2023-12-01 DIAGNOSIS — I161 Hypertensive emergency: Secondary | ICD-10-CM

## 2023-12-01 DIAGNOSIS — K219 Gastro-esophageal reflux disease without esophagitis: Secondary | ICD-10-CM | POA: Diagnosis not present

## 2023-12-01 DIAGNOSIS — Z801 Family history of malignant neoplasm of trachea, bronchus and lung: Secondary | ICD-10-CM

## 2023-12-01 DIAGNOSIS — Z882 Allergy status to sulfonamides status: Secondary | ICD-10-CM | POA: Diagnosis not present

## 2023-12-01 DIAGNOSIS — E872 Acidosis, unspecified: Secondary | ICD-10-CM | POA: Diagnosis not present

## 2023-12-01 DIAGNOSIS — Z8379 Family history of other diseases of the digestive system: Secondary | ICD-10-CM | POA: Diagnosis not present

## 2023-12-01 DIAGNOSIS — Z79890 Hormone replacement therapy (postmenopausal): Secondary | ICD-10-CM | POA: Diagnosis not present

## 2023-12-01 DIAGNOSIS — Z7951 Long term (current) use of inhaled steroids: Secondary | ICD-10-CM

## 2023-12-01 DIAGNOSIS — E039 Hypothyroidism, unspecified: Secondary | ICD-10-CM | POA: Diagnosis not present

## 2023-12-01 DIAGNOSIS — D649 Anemia, unspecified: Secondary | ICD-10-CM | POA: Diagnosis not present

## 2023-12-01 DIAGNOSIS — D72829 Elevated white blood cell count, unspecified: Secondary | ICD-10-CM | POA: Diagnosis not present

## 2023-12-01 DIAGNOSIS — I959 Hypotension, unspecified: Secondary | ICD-10-CM | POA: Diagnosis not present

## 2023-12-01 DIAGNOSIS — R1084 Generalized abdominal pain: Principal | ICD-10-CM

## 2023-12-01 DIAGNOSIS — G43009 Migraine without aura, not intractable, without status migrainosus: Secondary | ICD-10-CM | POA: Diagnosis not present

## 2023-12-01 LAB — COMPREHENSIVE METABOLIC PANEL
ALT: 26 U/L (ref 0–44)
AST: 15 U/L (ref 15–41)
Albumin: 4.2 g/dL (ref 3.5–5.0)
Alkaline Phosphatase: 48 U/L (ref 38–126)
Anion gap: 9 (ref 5–15)
BUN: 12 mg/dL (ref 6–20)
CO2: 31 mmol/L (ref 22–32)
Calcium: 9.9 mg/dL (ref 8.9–10.3)
Chloride: 94 mmol/L — ABNORMAL LOW (ref 98–111)
Creatinine, Ser: 1 mg/dL (ref 0.44–1.00)
GFR, Estimated: >60 mL/min (ref >60)
Glucose, Bld: 105 mg/dL — ABNORMAL HIGH (ref 70–99)
Potassium: 4 mmol/L (ref 3.5–5.1)
Sodium: 134 mmol/L — ABNORMAL LOW (ref 135–145)
Total Bilirubin: 0.8 mg/dL (ref 0.0–1.2)
Total Protein: 7.9 g/dL (ref 6.5–8.1)

## 2023-12-01 LAB — CBC
HCT: 46.3 % — ABNORMAL HIGH (ref 36.0–46.0)
Hemoglobin: 16 g/dL — ABNORMAL HIGH (ref 12.0–15.0)
MCH: 32.1 pg (ref 26.0–34.0)
MCHC: 34.6 g/dL (ref 30.0–36.0)
MCV: 93 fL (ref 80.0–100.0)
Platelets: 264 10*3/uL (ref 150–400)
RBC: 4.98 MIL/uL (ref 3.87–5.11)
RDW: 11.8 % (ref 11.5–15.5)
WBC: 10.5 10*3/uL (ref 4.0–10.5)
nRBC: 0 % (ref 0.0–0.2)

## 2023-12-01 LAB — URINALYSIS, ROUTINE W REFLEX MICROSCOPIC
Bilirubin Urine: NEGATIVE
Glucose, UA: NEGATIVE mg/dL
Hgb urine dipstick: NEGATIVE
Ketones, ur: NEGATIVE mg/dL
Leukocytes,Ua: NEGATIVE
Nitrite: NEGATIVE
Protein, ur: NEGATIVE mg/dL
Specific Gravity, Urine: 1.009 (ref 1.005–1.030)
pH: 7.5 (ref 5.0–8.0)

## 2023-12-01 LAB — MRSA NEXT GEN BY PCR, NASAL: MRSA by PCR Next Gen: NOT DETECTED

## 2023-12-01 LAB — LACTIC ACID, PLASMA
Lactic Acid, Venous: 0.7 mmol/L (ref 0.5–1.9)
Lactic Acid, Venous: 0.9 mmol/L (ref 0.5–1.9)

## 2023-12-01 LAB — LIPASE, BLOOD: Lipase: 11 U/L (ref 11–51)

## 2023-12-01 LAB — HCG, SERUM, QUALITATIVE: Preg, Serum: NEGATIVE

## 2023-12-01 MED ORDER — KETOROLAC TROMETHAMINE 15 MG/ML IJ SOLN
15.0000 mg | Freq: Once | INTRAMUSCULAR | Status: AC
  Administered 2023-12-01: 15 mg via INTRAVENOUS
  Filled 2023-12-01: qty 1

## 2023-12-01 MED ORDER — HEPARIN (PORCINE) 25000 UT/250ML-% IV SOLN
1100.0000 [IU]/h | INTRAVENOUS | Status: DC
  Administered 2023-12-01: 1000 [IU]/h via INTRAVENOUS
  Administered 2023-12-02 – 2023-12-04 (×3): 1200 [IU]/h via INTRAVENOUS
  Filled 2023-12-01 (×7): qty 250

## 2023-12-01 MED ORDER — FENTANYL CITRATE PF 50 MCG/ML IJ SOSY
25.0000 ug | PREFILLED_SYRINGE | INTRAMUSCULAR | Status: DC | PRN
  Administered 2023-12-02: 25 ug via INTRAVENOUS
  Filled 2023-12-01: qty 1

## 2023-12-01 MED ORDER — POLYETHYLENE GLYCOL 3350 17 G PO PACK
17.0000 g | PACK | Freq: Every day | ORAL | Status: DC | PRN
  Filled 2023-12-01: qty 1

## 2023-12-01 MED ORDER — IOHEXOL 350 MG/ML SOLN
80.0000 mL | Freq: Once | INTRAVENOUS | Status: AC | PRN
  Administered 2023-12-01: 80 mL via INTRAVENOUS

## 2023-12-01 MED ORDER — ONDANSETRON HCL 4 MG/2ML IJ SOLN
4.0000 mg | Freq: Four times a day (QID) | INTRAMUSCULAR | Status: DC | PRN
  Administered 2023-12-02 – 2023-12-03 (×4): 4 mg via INTRAVENOUS
  Filled 2023-12-01 (×5): qty 2

## 2023-12-01 MED ORDER — IOHEXOL 300 MG/ML  SOLN
100.0000 mL | Freq: Once | INTRAMUSCULAR | Status: AC | PRN
  Administered 2023-12-01: 85 mL via INTRAVENOUS

## 2023-12-01 MED ORDER — BUPROPION HCL ER (XL) 150 MG PO TB24
150.0000 mg | ORAL_TABLET | Freq: Every day | ORAL | Status: DC
  Administered 2023-12-02 – 2023-12-05 (×4): 150 mg via ORAL
  Filled 2023-12-01 (×4): qty 1

## 2023-12-01 MED ORDER — MORPHINE SULFATE (PF) 2 MG/ML IV SOLN: 2.0000 mg | INTRAVENOUS | Status: DC | PRN

## 2023-12-01 MED ORDER — TRAMADOL HCL 50 MG PO TABS
50.0000 mg | ORAL_TABLET | Freq: Four times a day (QID) | ORAL | Status: DC | PRN
  Administered 2023-12-01: 50 mg via ORAL
  Filled 2023-12-01: qty 1

## 2023-12-01 MED ORDER — ACETAMINOPHEN 325 MG PO TABS: 650.0000 mg | ORAL_TABLET | Freq: Four times a day (QID) | ORAL | Status: DC | PRN

## 2023-12-01 MED ORDER — ACETAMINOPHEN 325 MG PO TABS
650.0000 mg | ORAL_TABLET | Freq: Four times a day (QID) | ORAL | Status: DC | PRN
  Administered 2023-12-01 – 2023-12-02 (×2): 650 mg via ORAL
  Filled 2023-12-01 (×2): qty 2

## 2023-12-01 MED ORDER — HYOSCYAMINE SULFATE 0.125 MG SL SUBL
0.2500 mg | SUBLINGUAL_TABLET | Freq: Once | SUBLINGUAL | Status: AC
  Administered 2023-12-01: 0.25 mg via SUBLINGUAL
  Filled 2023-12-01: qty 2

## 2023-12-01 MED ORDER — ESMOLOL HCL-SODIUM CHLORIDE 2000 MG/100ML IV SOLN
25.0000 ug/kg/min | INTRAVENOUS | Status: DC
  Administered 2023-12-01 (×2): 300 ug/kg/min via INTRAVENOUS
  Administered 2023-12-01 – 2023-12-02 (×2): 50 ug/kg/min via INTRAVENOUS
  Administered 2023-12-02: 100 ug/kg/min via INTRAVENOUS
  Administered 2023-12-02: 250 ug/kg/min via INTRAVENOUS
  Filled 2023-12-01 (×3): qty 100
  Filled 2023-12-01: qty 200
  Filled 2023-12-01 (×6): qty 100

## 2023-12-01 MED ORDER — LACTATED RINGERS IV BOLUS
1000.0000 mL | Freq: Once | INTRAVENOUS | Status: AC
Start: 2023-12-01 — End: 2023-12-01
  Administered 2023-12-01: 1000 mL via INTRAVENOUS

## 2023-12-01 MED ORDER — ACETAMINOPHEN 325 MG PO TABS
650.0000 mg | ORAL_TABLET | Freq: Once | ORAL | Status: AC
  Administered 2023-12-01: 650 mg via ORAL
  Filled 2023-12-01: qty 2

## 2023-12-01 MED ORDER — MOMETASONE FURO-FORMOTEROL FUM 200-5 MCG/ACT IN AERO
2.0000 | INHALATION_SPRAY | Freq: Two times a day (BID) | RESPIRATORY_TRACT | Status: DC
  Administered 2023-12-01 – 2023-12-04 (×5): 2 via RESPIRATORY_TRACT
  Filled 2023-12-01: qty 8.8

## 2023-12-01 MED ORDER — CLEVIDIPINE BUTYRATE 0.5 MG/ML IV EMUL
0.0000 mg/h | INTRAVENOUS | Status: DC
  Administered 2023-12-01 (×2): 2 mg/h via INTRAVENOUS
  Administered 2023-12-02: 1 mg/h via INTRAVENOUS

## 2023-12-01 MED ORDER — VALACYCLOVIR HCL 500 MG PO TABS
500.0000 mg | ORAL_TABLET | Freq: Every day | ORAL | Status: DC
  Filled 2023-12-01 (×4): qty 1

## 2023-12-01 MED ORDER — HYDRALAZINE HCL 20 MG/ML IJ SOLN: 10.0000 mg | Freq: Once | INTRAMUSCULAR | Status: DC

## 2023-12-01 MED ORDER — ESMOLOL HCL-SODIUM CHLORIDE 2000 MG/100ML IV SOLN: 25.0000 ug/kg/min | INTRAVENOUS | Status: DC

## 2023-12-01 MED ORDER — FAMOTIDINE 20 MG PO TABS
20.0000 mg | ORAL_TABLET | Freq: Two times a day (BID) | ORAL | Status: DC
  Administered 2023-12-01 – 2023-12-03 (×5): 20 mg via ORAL
  Filled 2023-12-01 (×5): qty 1

## 2023-12-01 MED ORDER — KETOROLAC TROMETHAMINE 15 MG/ML IJ SOLN: 15.0000 mg | Freq: Once | INTRAMUSCULAR | Status: DC

## 2023-12-01 MED ORDER — LEVOTHYROXINE SODIUM 50 MCG PO TABS
50.0000 ug | ORAL_TABLET | Freq: Every day | ORAL | Status: DC
  Administered 2023-12-02 – 2023-12-05 (×4): 50 ug via ORAL
  Filled 2023-12-01 (×2): qty 2
  Filled 2023-12-01: qty 1
  Filled 2023-12-01: qty 2

## 2023-12-01 MED ORDER — DOCUSATE SODIUM 100 MG PO CAPS
100.0000 mg | ORAL_CAPSULE | Freq: Two times a day (BID) | ORAL | Status: DC | PRN
  Administered 2023-12-03: 100 mg via ORAL
  Filled 2023-12-01: qty 1

## 2023-12-01 MED ORDER — ORAL CARE MOUTH RINSE: 15.0000 mL | OROMUCOSAL | Status: DC | PRN

## 2023-12-01 MED ORDER — KETOROLAC TROMETHAMINE 15 MG/ML IJ SOLN: 15.0000 mg | Freq: Four times a day (QID) | INTRAMUSCULAR | Status: DC | PRN

## 2023-12-01 MED ORDER — ESMOLOL BOLUS VIA INFUSION
500.0000 ug/kg | Freq: Once | INTRAVENOUS | Status: AC
  Administered 2023-12-01: 34700 ug via INTRAVENOUS
  Filled 2023-12-01: qty 35000

## 2023-12-01 NOTE — Consult Note (Addendum)
ED Consult    Reason for Consult:  SMA dissection Referring Physician:  Dr .Merrily Pew MRN #:  [id number]  History of Present Illness: This is a 48 y.o. female without significant past medical history particularly no history of vascular disease and no family history of vascular disease other than a grandmother that had an aortic aneurysm but was not operated on.  Tuesday she began having pain particularly in her mid abdomen and the last time she was really able to eat was Thursday but has had no appetite and has also been constipated since Tuesday.  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  She states that Toradol has significantly helped the pain currently not in any pain but still with no real appetite.  Her blood pressure was noted to be significantly elevated she was started on esmolol drip prior to transfer.  Past Medical History:  Diagnosis Date   Allergy    Anemia    IV iron in past, prior to 2018   Asthma    Celiac disease    +prior biopsy   GERD (gastroesophageal reflux disease)    H/O bone density study 12/21/2017   normal   Hip fracture ([facility]) 2008   right, Dr. Darrick Penna   History of stress test 2014   [facility]   Hypothyroidism    Migraine headache without aura    gets bad nausea.  does well on Naratriptan and Relpax.  Failed Imitrex, Maxalt   Stress fracture    severeral prior   Transfusion history    has had IV iron prior, Dr. Myna Hidalgo prior management    Past Surgical History:  Procedure Laterality Date   COLONOSCOPY  2015   [facility], advised to repeat q5 years; x 4 prior   ESOPHAGOGASTRODUODENOSCOPY     x5 prior as of 2016   INGUINAL HERNIA REPAIR     left   NASAL SEPTUM SURGERY     POLYPECTOMY     sigmoid colon   SINOSCOPY     WISDOM TOOTH EXTRACTION      Allergies  Allergen Reactions    Sulfa Antibiotics Anaphylaxis   Montelukast Other (See Comments)    Angry, irritable, out of skin feeling   Other Hives    Dark alcohol   Singulair [Montelukast Sodium]     Angry, irritable, out of skin feeling    Prior to Admission medications   Medication Sig Start Date End Date Taking? Authorizing Provider  amoxicillin-clavulanate (AUGMENTIN) 875-125 MG tablet Take 1 tablet by mouth 2 (two) times daily. 11/30/23   Tysinger, Kermit Balo, PA-C  botulinum toxin Type A (BOTOX) 200 units injection Provider to inject 155 units into the muscles of the head and neck every 12 weeks. Discard remainder. 07/11/23   Anson Fret, MD  budesonide-formoterol (SYMBICORT) 160-4.5 MCG/ACT inhaler Inhale 2 puffs into the lungs 2 (two) times daily. in the morning and at bedtime. 11/06/23   Tysinger, Kermit Balo, PA-C  buPROPion (WELLBUTRIN XL) 150 MG 24 hr tablet TAKE 1 TABLET BY MOUTH EVERY DAY 10/30/23   Tysinger, Kermit Balo, PA-C  cyclobenzaprine (FLEXERIL) 5 MG tablet TAKE 1 TABLET BY MOUTH EVERYDAY AT BEDTIME 12/01/23   Tysinger, Kermit Balo, PA-C  diclofenac (FLECTOR) 1.3 % PTCH Place 1 patch onto the skin 2 (two) times daily. 09/19/23   Lenda Kelp, MD  etonogestrel-ethinyl estradiol (NUVARING) 0.12-0.015 MG/24HR vaginal ring Place 1 each vaginally every 28 (twenty-eight) days. Insert vaginally and leave in place for 3 consecutive weeks, then remove for 1 week. 11/02/21   Tysinger, Kermit Balo, PA-C  famotidine (PEPCID) 20 MG tablet Take by mouth 2 (two) times daily.    [provider]  FeAspGl-FeFum-B12-FA-C-Succ Ac (FERREX 28) MISC Take 1 tablet by mouth daily. 03/25/22   Tysinger, Kermit Balo, PA-C  Fremanezumab-vfrm (AJOVY) 225 MG/1.5ML SOAJ Inject 225 mg into the skin every 30 (thirty) days. Please run copay card: BIN [id number] PCN [id number] GRP [id number] ID [id number] 08/14/23   Anson Fret, MD  Homeopathic Products (ZINC) LOZG Use as directed in the mouth or throat.    [provider]  hydroquinone 4 %  cream  10/12/20   [provider]  MAGNESIUM GLUCONATE PO Take 400 mg by mouth daily.    [provider]  naratriptan (AMERGE) 2.5 MG tablet TAKE 1 TABLET AT ONSET OF HEADACHE MAY REPEAT IN 4 HRS IF NOT RESOLVED DO NOT EXCEED 5 MG IN 24 HRS 10/02/23   Tysinger, Kermit Balo, PA-C  ondansetron (ZOFRAN-ODT) 4 MG disintegrating tablet DISSOLVE 1 TABLET ON THE TONGUE EVERY 8 HOURS AS NEEDED FOR NAUSEA AND VOMITING 07/20/23   Tysinger, Kermit Balo, PA-C  oxyCODONE-acetaminophen (PERCOCET/ROXICET) 5-325 MG tablet Take 1 tablet by mouth every 6 (six) hours as needed for up to 5 days. 11/30/23 12/05/23  Tysinger, Kermit Balo, PA-C  RIBOFLAVIN PO Take 400 mg by mouth daily.    [provider]  rizatriptan (MAXALT) 10 MG tablet Take 1 tablet (10 mg total) by mouth once as needed for migraine. May repeat in 2 hours if needed 03/15/23 03/15/24  Tysinger, Kermit Balo, PA-C  SYNTHROID 50 MCG tablet Take 1 tablet (50 mcg total) by mouth daily. 11/06/23   Tysinger, Kermit Balo, PA-C  tretinoin (RETIN-A) 0.05 % cream SMARTSIG:1 Sparingly Topical Daily 09/17/20   [provider]  triamcinolone (KENALOG) 0.1 % Apply topically. 01/17/21   [provider]  Ubrogepant (UBRELVY PO) Take by mouth daily as needed. Patient not taking: Reported on 11/30/2023    [provider]  UNABLE TO FIND Med Name: Pro air inhaler HFA    [provider]  valACYclovir (VALTREX) 500 MG tablet TAKE 1 TABLET BY MOUTH EVERY DAY **MYLAN BRAND** 12/01/23   Tysinger, Kermit Balo, PA-C  vitamin C (ASCORBIC ACID) 500 MG tablet Take 500 mg by mouth daily.    [provider]    Social History   Socioeconomic History   Marital status: Single    Spouse name: Not on file   Number of children: Not on file   Years of education: Not on file   Highest education level: Doctorate  Occupational History   Not on file  Tobacco Use   Smoking status: Never   Smokeless tobacco: Never  Vaping Use   Vaping status:  Never Used  Substance and Sexual Activity   Alcohol use: Yes    Alcohol/week: 2.0 standard drinks of alcohol    Types: 1 Glasses of wine, 1 Shots of liquor per week    Comment: rare   Drug use: No   Sexual activity: Not on file  Other Topics Concern   Not on file  Social History Narrative   Lives alone, 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   Prior worked as professor at [employer].  Exercise:- 6-7 days  per week, teaches spin at [employer], swam competitively in college, marathon runner.       [facility]:   Originally from [place]. Has also lived in [place], [place], & [place].  Currently working in counseling. No pets currently. No bird exposure. No mold or hot tub exposure. Currently has carpet in the bedroom. No draperies. No exposure to asbestos.      Social Drivers of Corporate investment banker Strain: Low Risk  (11/06/2023)   Overall Financial Resource Strain (CARDIA)    Difficulty of Paying Living Expenses: Not very hard  Food Insecurity: No Food Insecurity (12/01/2023)   Hunger Vital Sign    Worried About Running Out of Food in the Last Year: Never true    Ran Out of Food in the Last Year: Never true  Recent Concern: Food Insecurity - Food Insecurity Present (11/06/2023)   Hunger Vital Sign    Worried About Running Out of Food in the Last Year: Sometimes true    Ran Out of Food in the Last Year: Never true  Transportation Needs: No Transportation Needs (12/01/2023)   PRAPARE - Administrator, Civil Service (Medical): No    Lack of Transportation (Non-Medical): No  Physical Activity: Sufficiently Active (11/06/2023)   Exercise Vital Sign    Days of Exercise per Week: 5 days    Minutes of Exercise per Session: 60 min  Stress: Stress Concern Present (11/06/2023)   [employer] of Occupational Health - Occupational Stress Questionnaire    Feeling of Stress : To some extent  Social  Connections: Moderately Isolated (12/01/2023)   Social Connection and Isolation Panel [NHANES]    Frequency of Communication with Friends and Family: Twice a week    Frequency of Social Gatherings with Friends and Family: Twice a week    Attends Religious Services: Never    Database administrator or Organizations: Yes    Attends Banker Meetings: 1 to 4 times per year    Marital Status: Never married  Intimate Partner Violence: Not At Risk (12/01/2023)   Humiliation, Afraid, Rape, and Kick questionnaire    Fear of Current or Ex-Partner: No    Emotionally Abused: No    Physically Abused: No    Sexually Abused: No    Family History  Problem Relation Age of Onset   Hypertension Mother    Osteopenia Mother    Raynaud syndrome Mother    Celiac disease Mother    Cancer Father        asbestos related lung cancer   Heart disease Father 54       Stent   Pulmonary embolism Father    Celiac disease Brother    Celiac disease Brother    Stroke Maternal Grandmother    COPD Maternal Grandmother    Osteopenia Maternal Grandmother    Heart disease Maternal Grandfather    Stroke Maternal Grandfather    Heart disease Paternal Grandfather        died 64yo   Diabetes Neg Hx     Review of Systems  Constitutional: Negative.   HENT: Negative.    Eyes: Negative.   Respiratory: Negative.    Cardiovascular: Negative.   Gastrointestinal:  Positive for abdominal pain.  Skin: Negative.   Neurological: Negative.   Endo/Heme/Allergies: Negative.   Psychiatric/Behavioral: Negative.        Physical Examination  Vitals:   12/01/23 1908 12/01/23 1910  BP: (!) 137/92 122/80  Pulse: 96 89  Resp:  19 18  Temp:    SpO2: 98% 100%   Body mass index is 23.58 kg/m.  Physical Exam HENT:     Head: Normocephalic.     Nose: Nose normal.  Eyes:     Pupils: Pupils are equal, round, and reactive to light.  Cardiovascular:     Rate and Rhythm: Normal rate.     Pulses: Normal pulses.   Abdominal:     General: Abdomen is flat.     Palpations: Abdomen is soft.     Comments: Mild abdominal discomfort to palpation  Musculoskeletal:        General: Normal range of motion.     Cervical back: Normal range of motion and neck supple.     Right lower leg: No edema.     Left lower leg: No edema.  Skin:    General: Skin is warm.     Capillary Refill: Capillary refill takes less than 2 seconds.  Neurological:     General: No focal deficit present.     Mental Status: She is alert.  Psychiatric:        Mood and Affect: Mood normal.      CBC    Component Value Date/Time   WBC 10.5 12/01/2023 1013   RBC 4.98 12/01/2023 1013   HGB 16.0 (H) 12/01/2023 1013   HGB 15.2 11/06/2023 1231   HGB 15.2 02/11/2008 1457   HCT 46.3 (H) 12/01/2023 1013   HCT 45.3 11/06/2023 1231   HCT 43.0 02/11/2008 1457   PLT 264 12/01/2023 1013   PLT 281 11/06/2023 1231   MCV 93.0 12/01/2023 1013   MCV 96 11/06/2023 1231   MCV 92.4 02/11/2008 1457   MCH 32.1 12/01/2023 1013   MCHC 34.6 12/01/2023 1013   RDW 11.8 12/01/2023 1013   RDW 11.9 11/06/2023 1231   RDW 12.2 02/11/2008 1457   LYMPHSABS 1.3 11/06/2023 1231   LYMPHSABS 1.2 02/11/2008 1457   MONOABS 0.5 02/27/2019 1056   MONOABS 0.3 02/11/2008 1457   EOSABS 0.3 11/06/2023 1231   BASOSABS 0.1 11/06/2023 1231   BASOSABS 0.0 02/11/2008 1457    BMET    Component Value Date/Time   NA 134 (L) 12/01/2023 1013   NA 135 11/06/2023 1231   K 4.0 12/01/2023 1013   CL 94 (L) 12/01/2023 1013   CO2 31 12/01/2023 1013   GLUCOSE 105 (H) 12/01/2023 1013   BUN 12 12/01/2023 1013   BUN 12 11/06/2023 1231   CREATININE 1.00 12/01/2023 1013   CREATININE 1.17 (H) 09/13/2019 0857   CALCIUM 9.9 12/01/2023 1013   GFRNONAA >60 12/01/2023 1013   GFRNONAA 56 (L) 09/13/2019 0857   GFRAA 86 12/18/2020 1402   GFRAA 65 09/13/2019 0857    COAGS: No results found for: "INR", "PROTIME"   Non-Invasive Vascular Imaging:   CTA IMPRESSION: 1. As noted  on the exam from earlier today there are signs of dissection involving the superior mesenteric artery with areas of mural thrombosis or thrombus within the false lumen resulting in multifocal areas of short segment luminal narrowing. There is contrast opacification of the distal branches of the superior mesenteric artery. Additionally, the celiac artery and its branches as well as the inferior mesenteric artery are patent and well opacified. Differential considerations include vasculitides, fibromuscular dysplasia, or connective tissue disorders. Clinical correlation is advised. 2. No signs of bowel ischemia or obstruction.   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  Thankfully she has minimal symptoms at this time and her white blood cell count and lactic acid are not elevated.  We discussed the need for urgent blood pressure control which will require ICU admission and also will place her on heparin until we see if her pain subsides totally.  She can eat but will likely need to take it slow for a couple days until her blood pressure and pain are under better control.  If pain does not resolve I discussed with patient we would likely repeat CT angio as an inpatient and consider endovascular intervention a very last resort.  If her pain does resolve and she is able to advance her diet we could consider rescanning as an outpatient and will discuss anticoagulation versus antiplatelet therapy.  All of this was discussed with the patient and she demonstrates good understanding.  We can check sed rate and CRP while patient is admitted but will ultimately require rheumatology evaluation as an outpatient.   Sherill Wegener C. Randie Heinz, MD [facility] Office: [phone] Pager: [phone]

## 2023-12-01 NOTE — ED Notes (Signed)
 Patient transported to CT

## 2023-12-01 NOTE — ED Provider Notes (Addendum)
  Physical Exam  BP (!) 176/117   Pulse 85   Temp 98 F (36.7 C)   Resp 18   Ht 5\' 7"  (1.702 m)   Wt 69.4 kg   SpO2 100%   BMI 23.96 kg/m   Physical Exam  Procedures  Procedures  ED Course / MDM   Clinical Course as of 12/01/23 1756  Fri Dec 01, 2023  1533 Dr. Heath Lark with vascular surgery reviewed scans, recommends transfer to [facility] ED to ED for eval/plan. Please notify his team when patient arrives via consult or secure chat. 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  Patient is on esmolol drip.  Dr. Randie Heinz is at bedside to talk to patient on arrival.  She saw patient and does not recommend any surgery currently.  He recommend aggressively managing her blood pressure.  Patient is almost maxed out on esmolol drip at this point and blood pressure still in the 170s.  I discussed case with pharmacist, will add Cleviprex drip.  I consulted ICU to see patient  6:04 PM ICU to admit.   CRITICAL CARE Performed by: Richardean Canal   Total critical care time: 45 minutes  Critical care time was exclusive of separately billable procedures and treating other patients.  Critical care was necessary to treat or prevent imminent or life-threatening deterioration.  Critical care was time spent personally by me on the following activities: development of treatment plan with patient and/or surrogate as well as nursing, discussions with consultants, evaluation of patient's response to treatment, examination of patient, obtaining history from patient or surrogate, ordering and performing treatments and interventions, ordering and review of laboratory studies, ordering and review of radiographic studies, pulse oximetry and re-evaluation of patient's condition.   Problems Addressed: Generalized abdominal pain: acute illness or injury Hypertensive emergency: acute illness or  injury  Amount and/or Complexity of Data Reviewed Labs: ordered. Decision-making details documented in ED Course. Radiology: ordered and independent interpretation performed. Decision-making details documented in ED Course.  Risk OTC drugs. Prescription drug management.          Charlynne Pander, MD 12/01/23 Julio Sicks    Charlynne Pander, MD 12/01/23 [phone]

## 2023-12-01 NOTE — ED Provider Notes (Addendum)
3:05 PM Patient signed out to me by previous ED physician. Pt is a 48 yo female presenting for abdominal pain and flank pain.   Labs stable. No s/s sepsis UA negative for UTI or hematuria CTA abd/pelvis pending  Plan: If CTA normal dc with GI follow up.   Physical Exam  BP (!) 164/99 (BP Location: Right Arm)   Pulse 83   Temp 98 F (36.7 C)   Resp 18   Ht 5\' 7"  (1.702 m)   Wt 69.4 kg   SpO2 100%   BMI 23.96 kg/m   Physical Exam  Procedures  .Critical Care  Performed by: Franne Forts, DO Authorized by: Franne Forts, DO   Critical care provider statement:    Critical care time (minutes):  30   Critical care was time spent personally by me on the following activities:  Development of treatment plan with patient or surrogate, discussions with consultants, evaluation of patient's response to treatment, examination of patient, ordering and review of laboratory studies, ordering and review of radiographic studies, ordering and performing treatments and interventions, pulse oximetry, re-evaluation of patient's condition and review of old charts   Care discussed with comment:  Vascular surgery and ED physician for transfer   ED Course / MDM   Clinical Course as of 12/01/23 1552  Fri Dec 01, 2023  1533 Dr. Heath Lark with vascular surgery reviewed scans, recommends transfer to [facility] ED to ED for eval/plan. Please notify his team when patient arrives via consult or secure chat. [AG]    Clinical Course User Index [AG] Franne Forts, DO   Medical Decision Making Amount and/or Complexity of Data Reviewed Labs: ordered. Radiology: ordered.  Risk Prescription drug management.   Esmolol bolus and ggt started Pt accepted to [facility] ED by Dr. Silverio Lay. [facility] called. Patient up to date on status.  Remains awake, alert, stable vital signs currently.  Pain controlled at this time.    Franne Forts, DO 12/01/23 1552     Franne Forts, DO 12/01/23 1640

## 2023-12-01 NOTE — ED Provider Notes (Signed)
Barbara Odonnell EMERGENCY DEPARTMENT AT [facility] Provider Note   CSN: [id number] Arrival date & time: 12/01/23  0272     History  Chief Complaint  Patient presents with   Flank Pain   Groin Pain    Barbara Odonnell is a 48 y.o. female.  48 year old female with past medical history of colonic polyps in the past as well as hypothyroidism presents emergency department today with left-sided flank pain abdominal pain.  This been going on since Tuesday.  The patient did see urology earlier and had favorable workup.  She reports the pain has been relatively persistent.  She states that she has had some associated nausea but denies any vomiting.  Denies any associated urinary symptoms.  She states that the pain is relatively constant.  Called her primary care doctor today and was referred to the ER.  She denies seeing any blood in her stool or dark stools but did perform a fecal occult blood test at home which was positive.  She called her doctor at that point and was told to come to the ER for further evaluation.   Flank Pain Associated symptoms include abdominal pain.  Groin Pain Associated symptoms include abdominal pain.       Home Medications Prior to Admission medications   Medication Sig Start Date End Date Taking? Authorizing Provider  albuterol (VENTOLIN HFA) 108 (90 Base) MCG/ACT inhaler TAKE 2 PUFFS BY MOUTH EVERY 6 HOURS AS NEEDED FOR WHEEZE OR SHORTNESS OF BREATH 11/29/23   Tysinger, Kermit Balo, PA-C  amoxicillin-clavulanate (AUGMENTIN) 875-125 MG tablet Take 1 tablet by mouth 2 (two) times daily. 11/30/23   Tysinger, Kermit Balo, PA-C  botulinum toxin Type A (BOTOX) 200 units injection Provider to inject 155 units into the muscles of the head and neck every 12 weeks. Discard remainder. 07/11/23   Anson Fret, MD  budesonide-formoterol (SYMBICORT) 160-4.5 MCG/ACT inhaler Inhale 2 puffs into the lungs 2 (two) times daily. in the morning and at bedtime. 11/06/23   Tysinger, Kermit Balo, PA-C  buPROPion (WELLBUTRIN XL) 150 MG 24 hr tablet TAKE 1 TABLET BY MOUTH EVERY DAY 10/30/23   Tysinger, Kermit Balo, PA-C  cyclobenzaprine (FLEXERIL) 5 MG tablet TAKE 1 TABLET BY MOUTH EVERYDAY AT BEDTIME 05/03/23   Tysinger, Kermit Balo, PA-C  diclofenac (FLECTOR) 1.3 % PTCH Place 1 patch onto the skin 2 (two) times daily. 09/19/23   Lenda Kelp, MD  etonogestrel-ethinyl estradiol (NUVARING) 0.12-0.015 MG/24HR vaginal ring Place 1 each vaginally every 28 (twenty-eight) days. Insert vaginally and leave in place for 3 consecutive weeks, then remove for 1 week. 11/02/21   Tysinger, Kermit Balo, PA-C  famotidine (PEPCID) 20 MG tablet Take by mouth 2 (two) times daily.    [provider]  FeAspGl-FeFum-B12-FA-C-Succ Ac (FERREX 28) MISC Take 1 tablet by mouth daily. 03/25/22   Tysinger, Kermit Balo, PA-C  Fremanezumab-vfrm (AJOVY) 225 MG/1.5ML SOAJ Inject 225 mg into the skin every 30 (thirty) days. Please run copay card: BIN [id number] PCN [id number] GRP [id number] ID [id number] 08/14/23   Anson Fret, MD  Homeopathic Products (ZINC) LOZG Use as directed in the mouth or throat.    [provider]  hydroquinone 4 % cream  10/12/20   [provider]  MAGNESIUM GLUCONATE PO Take 400 mg by mouth daily.    [provider]  naratriptan (AMERGE) 2.5 MG tablet TAKE 1 TABLET AT ONSET OF HEADACHE MAY REPEAT IN 4 HRS IF NOT RESOLVED DO NOT EXCEED 5 MG  IN 24 HRS 10/02/23   Tysinger, Kermit Balo, PA-C  ondansetron (ZOFRAN-ODT) 4 MG disintegrating tablet DISSOLVE 1 TABLET ON THE TONGUE EVERY 8 HOURS AS NEEDED FOR NAUSEA AND VOMITING 07/20/23   Tysinger, Kermit Balo, PA-C  oxyCODONE-acetaminophen (PERCOCET/ROXICET) 5-325 MG tablet Take 1 tablet by mouth every 6 (six) hours as needed for up to 5 days. 11/30/23 12/05/23  Tysinger, Kermit Balo, PA-C  RIBOFLAVIN PO Take 400 mg by mouth daily.    [provider]  rizatriptan (MAXALT) 10 MG tablet Take 1 tablet (10 mg total) by mouth once as needed for migraine. May  repeat in 2 hours if needed 03/15/23 03/15/24  Tysinger, Kermit Balo, PA-C  SYNTHROID 50 MCG tablet Take 1 tablet (50 mcg total) by mouth daily. 11/06/23   Tysinger, Kermit Balo, PA-C  tretinoin (RETIN-A) 0.05 % cream SMARTSIG:1 Sparingly Topical Daily 09/17/20   [provider]  triamcinolone (KENALOG) 0.1 % Apply topically. 01/17/21   [provider]  Ubrogepant (UBRELVY PO) Take by mouth daily as needed. Patient not taking: Reported on 11/30/2023    [provider]  UNABLE TO FIND Med Name: Pro air inhaler HFA    [provider]  valACYclovir (VALTREX) 500 MG tablet TAKE 1 TABLET BY MOUTH EVERY DAY **MYLAN BRAND** 09/01/23   Tysinger, Kermit Balo, PA-C  vitamin C (ASCORBIC ACID) 500 MG tablet Take 500 mg by mouth daily.    [provider]      Allergies    Sulfa antibiotics, Montelukast, Other, and Singulair [montelukast sodium]    Review of Systems   Review of Systems  Gastrointestinal:  Positive for abdominal pain.  Genitourinary:  Positive for flank pain.  All other systems reviewed and are negative.   Physical Exam Updated Vital Signs BP (!) 159/109   Pulse 76   Temp 97.8 F (36.6 C)   Resp 20   Ht 5\' 7"  (1.702 m)   Wt 69.4 kg   SpO2 100%   BMI 23.96 kg/m  Physical Exam Vitals and nursing note reviewed.   Gen: NAD Eyes: PERRL, EOMI HEENT: no oropharyngeal swelling Neck: trachea midline Resp: clear to auscultation bilaterally Card: RRR, no murmurs, rubs, or gallops Abd: Left-sided CVA tenderness noted, the patient is tender over the left lower quadrant without guarding or rebound Extremities: no calf tenderness, no edema Vascular: 2+ radial pulses bilaterally, 2+ DP pulses bilaterally Skin: no rashes Psyc: acting appropriately   ED Results / Procedures / Treatments   Labs (all labs ordered are listed, but only abnormal results are displayed) Labs Reviewed  CBC - Abnormal; Notable for the following components:      Result Value    Hemoglobin 16.0 (*)    HCT 46.3 (*)    All other components within normal limits  COMPREHENSIVE METABOLIC PANEL - Abnormal; Notable for the following components:   Sodium 134 (*)    Chloride 94 (*)    Glucose, Bld 105 (*)    All other components within normal limits  LIPASE, BLOOD  URINALYSIS, ROUTINE W REFLEX MICROSCOPIC  HCG, SERUM, QUALITATIVE  LACTIC ACID, PLASMA    EKG None  Radiology CT ABDOMEN PELVIS W CONTRAST Result Date: 12/01/2023 CLINICAL DATA:  Left lower quadrant abdominal pain. EXAM: CT ABDOMEN AND PELVIS WITH CONTRAST TECHNIQUE: Multidetector CT imaging of the abdomen and pelvis was performed using the standard protocol following bolus administration of intravenous contrast. RADIATION DOSE REDUCTION: This exam was performed according to the departmental dose-optimization program which includes automated exposure  control, adjustment of the mA and/or kV according to patient size and/or use of iterative reconstruction technique. CONTRAST:  85mL OMNIPAQUE IOHEXOL 300 MG/ML  SOLN COMPARISON:  Noncontrast CT 11/29/2023. FINDINGS: Lower chest: Slight breathing motion at the lung bases. No pleural effusion. Hepatobiliary: No focal liver abnormality is seen. No gallstones, gallbladder wall thickening, or biliary dilatation. Patent portal vein. Pancreas: Unremarkable. No pancreatic ductal dilatation or surrounding inflammatory changes. Spleen: Normal in size without focal abnormality. Adrenals/Urinary Tract: Adrenal glands are unremarkable. Kidneys are normal, without renal calculi, focal lesion, or hydronephrosis. Bladder is unremarkable. Stomach/Bowel: Stomach is nondilated. Large bowel has a normal course and caliber. There is a redundant course to the transverse colon. Normal appendix in the right hemipelvis. Small bowel is nondilated. Vascular/Lymphatic: Normal caliber aorta and IVC. There is proximal irregular narrowing of the SMA best seen on sagittal series 6, image 78. There is some  wall thickening as well as some stranding in this location as well. Please correlate for any symptoms. No specific abnormal lymph node enlargement identified in the abdomen pelvis. Reproductive: Uterus and bilateral adnexa are unremarkable. 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 Other: No free air or free fluid. Musculoskeletal: Slight degenerative changes along the spine with Schmorl's nodes. IMPRESSION: No bowel obstruction, free air or free fluid.  Normal appendix. 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 Adjacent stranding. This has differential. Etiologies such as vasculitis, fibromuscular dysplasia or other are possible. Please correlate with any particular symptoms and further evaluation when appropriate. Further imaging options would include dedicated CTA. Electronically Signed   By: Karen Kays M.D.   On: 12/01/2023 11:37    Procedures Procedures    Medications Ordered in ED Medications  hyoscyamine (LEVSIN SL) SL tablet 0.25 mg (0.25 mg Sublingual Given 12/01/23 1026)  iohexol (OMNIPAQUE) 300 MG/ML solution 100 mL (85 mLs Intravenous Contrast Given 12/01/23 1108)  ketorolac (TORADOL) 15 MG/ML injection 15 mg (15 mg Intravenous Given 12/01/23 1143)  lactated ringers bolus 1,000 mL (0 mLs Intravenous Stopped 12/01/23 1424)  iohexol (OMNIPAQUE) 350 MG/ML injection 80 mL (80 mLs Intravenous Contrast Given 12/01/23 1423)    ED Course/ Medical Decision Making/ A&P                                 Medical Decision Making 48 year old female with past medical history of hypothyroidism and colonic polyps in the past presenting the emergency department today with abdominal flank pain.  I will further evaluate patient here with basic labs including LFTs and lipase to evaluate for hepatobiliary pathology or pancreatitis.  Will obtain a CT scan of her abdomen with contrast to evaluate for  developing colitis, diverticulitis, perforated viscus, hepatobiliary pathology, or other intra-abdominal pathology.  I will give patient GI cocktail here as well as Levsin to see if helps her symptoms in the event this due to.  Her gastritis.  Rectal exam was discussed but deferred by patient and she is denying any frank blood in her stool.  At the end of the day I think that with her ongoing symptoms if her workup here is unremarkable that she will require GI follow-up for reevaluation regardless.  I will reevaluate for ultimate disposition.  The patient's labs are largely reassuring.  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  I discussed this with GI who recommended touching base with vascular surgery but felt  that further evaluation with a CT angiogram would be warranted.  Is able to discuss this with one of the OR staff who are in an emergent case with vascular surgery at the moment and did recommend CT angiogram.  This is ordered and pending at the time of signout.  Amount and/or Complexity of Data Reviewed Labs: ordered. Radiology: ordered.  Risk Prescription drug management.           Final Clinical Impression(s) / ED Diagnoses Final diagnoses:  Generalized abdominal pain    Rx / DC Orders ED Discharge Orders     None         Durwin Glaze, MD 12/01/23 1444

## 2023-12-01 NOTE — H&P (Signed)
NAME:  Barbara Odonnell, MRN:  [id number], DOB:  Dec 01, 1975, LOS: 0 ADMISSION DATE:  12/01/2023, CONSULTATION DATE:  12/01/23 REFERRING MD:  Dr. Silverio Lay, CHIEF COMPLAINT: SMA dissection  History of Present Illness:  Barbara Odonnell is a 48 year old female with a past medical history significant for asthma, celiac's disease, hypothyroidism, and anemia who presented to the ED for complaints of left flank and low back pain.  On ED admission she was seen severely hypertensive with BP 142/103.  Labs significant for sodium 134, chloride 94, glucose 105.  CT a abdomen and pelvis revealed dissection of the SMA with areas of mural thrombus within the false lumen.  Vascular consulted with no urgent indications for surgery, recommended tight BP control.  PCCM consulted for further management admission.  Pertinent  Medical History  Asthma, celiac's disease, hypothyroidism, and anemia   Significant Hospital Events: Including procedures, antibiotic start and stop dates in addition to other pertinent events   1/31 presented with lower abdominal/flank pain with low back pain.  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 rate 18, height 5\' 7"  (1.702 m), weight 69.4 kg, SpO2 100%.       No intake or output data in the 24 hours ending 12/01/23 1807 Filed Weights   12/01/23 0953  Weight: 69.4 kg    Examination: General: Well-appearing middle-aged female sitting up in ED stretcher in no acute distress HEENT: [place]/AT, MM pink/moist, PERRL,  Neuro: Alert and oriented x 3, nonfocal CV: s1s2 regular rate and rhythm, no murmur, rubs, or gallops,  PULM: Clear to auscultation bilaterally, no increased work of breathing, no added breath sounds GI: soft, bowel sounds active in all 4 quadrants,  non-tender, non-distended, tolerating oral diet Extremities: warm/dry, no edema  Skin: no rashes or lesions  Resolved Hospital Problem list     Assessment & Plan:  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  Ports recent episode of bronchitis post flu with significant cough and 2 separate steroid tapers P: Primary management per vascular Tight blood pressure control with SBP goal of 120 Continue esmolol and add Cleviprex drip Start oral antihypertensives Trend lactic acid Ensure adequate pain control Heparin drip  History of asthma P: As needed bronchodilators  Hypothyroidism P: Continue home Synthroid   Best Practice (right click and "Reselect all SmartList Selections" daily)   Diet/type: Regular consistency (see orders) DVT prophylaxis systemic heparin Pressure ulcer(s): N/A GI prophylaxis: PPI Lines: N/A Foley:  N/A Code Status:  full code Last date of multidisciplinary goals of care discussion   Labs   CBC: Recent Labs  Lab 12/01/23 1013  WBC 10.5  HGB 16.0*  HCT 46.3*  MCV 93.0  PLT 264    Basic Metabolic Panel: Recent Labs  Lab 12/01/23 1013  NA 134*  K 4.0  CL 94*  CO2 31  GLUCOSE 105*  BUN 12  CREATININE 1.00  CALCIUM 9.9   GFR: Estimated Creatinine Clearance: 65.5 mL/min (by C-G formula based on SCr of 1 mg/dL). Recent Labs  Lab 12/01/23 1013 12/01/23 1202  WBC 10.5  --   LATICACIDVEN  --  0.7    Liver Function Tests: Recent Labs  Lab 12/01/23 1013  AST 15  ALT 26  ALKPHOS 48  BILITOT 0.8  PROT 7.9  ALBUMIN 4.2   Recent Labs  Lab 12/01/23 1013  LIPASE 11   No results for input(s): "AMMONIA" in the last 168 hours.  ABG No results found for: "PHART", "PCO2ART", "PO2ART", "HCO3", "TCO2", "ACIDBASEDEF", "O2SAT"   Coagulation Profile: No results for input(s): "INR", "PROTIME" in the last 168 hours.  Cardiac Enzymes: No results for  input(s): "CKTOTAL", "CKMB", "CKMBINDEX", "TROPONINI" in the last 168 hours.  HbA1C: No results found for: "HGBA1C"  CBG: No results for input(s): "GLUCAP" in the last 168 hours.  Review of Systems:   Please see the history of present illness. All other systems reviewed and are negative   Past Medical History:  She,  has a past medical history of Allergy, Anemia, Asthma, Celiac disease, GERD (gastroesophageal reflux disease), H/O bone density study (12/21/2017), Hip fracture (HCC) (2008), History of stress test (2014), Hypothyroidism, Migraine headache without aura, Stress fracture, and Transfusion history.   Surgical History:   Past Surgical History:  Procedure Laterality Date   COLONOSCOPY  2015   [facility], advised to repeat q5 years; x 4 prior   ESOPHAGOGASTRODUODENOSCOPY     x5 prior as of 2016   INGUINAL HERNIA REPAIR     left   NASAL SEPTUM SURGERY     POLYPECTOMY     sigmoid colon   SINOSCOPY     WISDOM TOOTH EXTRACTION       Social History:   reports that she has never smoked. She has never used smokeless tobacco. She reports current alcohol use of about 2.0 standard drinks of alcohol per week. She reports that she does not use drugs.   Family History:  Her family history includes COPD in her maternal grandmother; Cancer in her father; Celiac disease in her brother, brother, and mother; Heart disease in her maternal grandfather and paternal grandfather; Heart disease (age of onset: 46) in her father; Hypertension in her mother; Osteopenia in her maternal grandmother and mother; Pulmonary embolism in her father; Raynaud syndrome in her mother; Stroke in her maternal grandfather and maternal grandmother. There is no history of Diabetes.   Allergies Allergies  Allergen Reactions   Sulfa Antibiotics Anaphylaxis   Montelukast Other (See Comments)    Angry, irritable, out of skin feeling   Other Hives    Dark alcohol   Singulair [Montelukast Sodium]     Angry,  irritable, out of skin feeling     Home Medications  Prior to Admission medications   Medication Sig Start Date End Date Taking? Authorizing Provider  albuterol (VENTOLIN HFA) 108 (90 Base) MCG/ACT inhaler TAKE 2 PUFFS BY MOUTH EVERY 6 HOURS AS NEEDED FOR WHEEZE OR SHORTNESS OF BREATH 11/29/23   Tysinger, Kermit Balo, PA-C  amoxicillin-clavulanate (AUGMENTIN) 875-125 MG tablet Take 1 tablet by mouth 2 (two) times daily. 11/30/23   Tysinger, Kermit Balo, PA-C  botulinum toxin Type A (BOTOX) 200 units injection Provider to inject 155 units into the muscles of the head and neck every 12 weeks. Discard remainder. 07/11/23   Anson Fret, MD  budesonide-formoterol (SYMBICORT) 160-4.5 MCG/ACT inhaler Inhale 2 puffs into the lungs 2 (two) times daily. in the morning and at bedtime. 11/06/23   Tysinger, Kermit Balo, PA-C  buPROPion (WELLBUTRIN XL) 150 MG 24 hr tablet TAKE 1 TABLET BY MOUTH EVERY DAY 10/30/23   Tysinger, Kermit Balo, PA-C  cyclobenzaprine (FLEXERIL) 5 MG tablet TAKE 1 TABLET BY MOUTH EVERYDAY AT BEDTIME 12/01/23  Tysinger, Kermit Balo, PA-C  diclofenac (FLECTOR) 1.3 % PTCH Place 1 patch onto the skin 2 (two) times daily. 09/19/23   Lenda Kelp, MD  etonogestrel-ethinyl estradiol (NUVARING) 0.12-0.015 MG/24HR vaginal ring Place 1 each vaginally every 28 (twenty-eight) days. Insert vaginally and leave in place for 3 consecutive weeks, then remove for 1 week. 11/02/21   Tysinger, Kermit Balo, PA-C  famotidine (PEPCID) 20 MG tablet Take by mouth 2 (two) times daily.    [provider]  FeAspGl-FeFum-B12-FA-C-Succ Ac (FERREX 28) MISC Take 1 tablet by mouth daily. 03/25/22   Tysinger, Kermit Balo, PA-C  Fremanezumab-vfrm (AJOVY) 225 MG/1.5ML SOAJ Inject 225 mg into the skin every 30 (thirty) days. Please run copay card: BIN [id number] PCN [id number] GRP [id number] ID [id number] 08/14/23   Anson Fret, MD  Homeopathic Products (ZINC) LOZG Use as directed in the mouth or throat.    [provider]  hydroquinone 4  % cream  10/12/20   [provider]  MAGNESIUM GLUCONATE PO Take 400 mg by mouth daily.    [provider]  naratriptan (AMERGE) 2.5 MG tablet TAKE 1 TABLET AT ONSET OF HEADACHE MAY REPEAT IN 4 HRS IF NOT RESOLVED DO NOT EXCEED 5 MG IN 24 HRS 10/02/23   Tysinger, Kermit Balo, PA-C  ondansetron (ZOFRAN-ODT) 4 MG disintegrating tablet DISSOLVE 1 TABLET ON THE TONGUE EVERY 8 HOURS AS NEEDED FOR NAUSEA AND VOMITING 07/20/23   Tysinger, Kermit Balo, PA-C  oxyCODONE-acetaminophen (PERCOCET/ROXICET) 5-325 MG tablet Take 1 tablet by mouth every 6 (six) hours as needed for up to 5 days. 11/30/23 12/05/23  Tysinger, Kermit Balo, PA-C  RIBOFLAVIN PO Take 400 mg by mouth daily.    [provider]  rizatriptan (MAXALT) 10 MG tablet Take 1 tablet (10 mg total) by mouth once as needed for migraine. May repeat in 2 hours if needed 03/15/23 03/15/24  Tysinger, Kermit Balo, PA-C  SYNTHROID 50 MCG tablet Take 1 tablet (50 mcg total) by mouth daily. 11/06/23   Tysinger, Kermit Balo, PA-C  tretinoin (RETIN-A) 0.05 % cream SMARTSIG:1 Sparingly Topical Daily 09/17/20   [provider]  triamcinolone (KENALOG) 0.1 % Apply topically. 01/17/21   [provider]  Ubrogepant (UBRELVY PO) Take by mouth daily as needed. Patient not taking: Reported on 11/30/2023    [provider]  UNABLE TO FIND Med Name: Pro air inhaler HFA    [provider]  valACYclovir (VALTREX) 500 MG tablet TAKE 1 TABLET BY MOUTH EVERY DAY **MYLAN BRAND** 12/01/23   Tysinger, Kermit Balo, PA-C  vitamin C (ASCORBIC ACID) 500 MG tablet Take 500 mg by mouth daily.    [provider]     Critical care time:   CRITICAL CARE Performed by: Ruthe Roemer D. Harris  Total critical care time: 40 minutes  Critical care time was exclusive of separately billable procedures and treating other patients.  Critical care was necessary to treat or prevent imminent or life-threatening deterioration.  Critical care was time spent  personally by me on the following activities: development of treatment plan with patient and/or surrogate as well as nursing, discussions with consultants, evaluation of patient's response to treatment, examination of patient, obtaining history from patient or surrogate, ordering and performing treatments and interventions, ordering and review of laboratory studies, ordering and review of radiographic studies, pulse oximetry and re-evaluation of patient's condition.  Tobie Hellen D. Tiburcio Pea, NP-C [facility] Personal contact information can be found on Amion  If no contact or  response made please call [phone] 12/01/2023, 6:58 PM

## 2023-12-01 NOTE — ED Triage Notes (Signed)
Onset Tuesday am   pain to left flank and lower back.  Pain to right lower abd. Groin area.  See MD urology two days ago.  Had CT scan done.  Here because of pain.  On percocet.  States has blood in stool.  Was constipated.

## 2023-12-01 NOTE — Telephone Encounter (Signed)
Call and see if she got any word on the CT scan from yesterday?

## 2023-12-01 NOTE — Discharge Instructions (Signed)
Please follow-up with the gastroenterologist the number provided.  Return to the ER for worsening symptoms.  Information on my medicine - ELIQUIS (apixaban)  This medication education was reviewed with me or my healthcare representative as part of my discharge preparation.  The pharmacist that spoke with me during my hospital stay was:  Carron Brazen, RPH  Why was Eliquis prescribed for you? Eliquis was prescribed for you to reduce the risk of forming blood clots that can cause a stroke if you have a medical condition called atrial fibrillation (a type of irregular heartbeat) OR to reduce the risk of a blood clots forming after orthopedic surgery.  What do You need to know about Eliquis ? Take your Eliquis TWICE DAILY - one tablet in the morning and one tablet in the evening with or without food.  It would be best to take the doses about the same time each day.  If you have difficulty swallowing the tablet whole please discuss with your pharmacist how to take the medication safely.  Take Eliquis exactly as prescribed by your doctor and DO NOT stop taking Eliquis without talking to the doctor who prescribed the medication.  Stopping may increase your risk of developing a new clot or stroke.  Refill your prescription before you run out.  After discharge, you should have regular check-up appointments with your healthcare provider that is prescribing your Eliquis.  In the future your dose may need to be changed if your kidney function or weight changes by a significant amount or as you get older.  What do you do if you miss a dose? If you miss a dose, take it as soon as you remember on the same day and resume taking twice daily.  Do not take more than one dose of ELIQUIS at the same time.  Important Safety Information A possible side effect of Eliquis is bleeding. You should call your healthcare provider right away if you experience any of the following: Bleeding from an injury or your  nose that does not stop. Unusual colored urine (red or dark brown) or unusual colored stools (red or black). Unusual bruising for unknown reasons. A serious fall or if you hit your head (even if there is no bleeding).  Some medicines may interact with Eliquis and might increase your risk of bleeding or clotting while on Eliquis. To help avoid this, consult your healthcare provider or pharmacist prior to using any new prescription or non-prescription medications, including herbals, vitamins, non-steroidal anti-inflammatory drugs (NSAIDs) and supplements.  This website has more information on Eliquis (apixaban): http://www.eliquis.com/eliquis/home

## 2023-12-01 NOTE — Progress Notes (Signed)
PHARMACY - ANTICOAGULATION CONSULT NOTE  Pharmacy Consult for heparin Indication: mural thrombus  Allergies  Allergen Reactions   Sulfa Antibiotics Anaphylaxis   Montelukast Other (See Comments)    Angry, irritable, out of skin feeling   Other Hives    Dark alcohol   Singulair [Montelukast Sodium]     Angry, irritable, out of skin feeling    Patient Measurements: Height: 5\' 7"  (170.2 cm) Weight: 69.4 kg (153 lb) IBW/kg (Calculated) : 61.6 Heparin Dosing Weight: 69kg  Vital Signs: Temp: 98 F (36.7 C) (01/31 1448) BP: 176/117 (01/31 1640) Pulse Rate: 85 (01/31 1640)  Labs: Recent Labs    12/01/23 1013  HGB 16.0*  HCT 46.3*  PLT 264  CREATININE 1.00    Estimated Creatinine Clearance: 65.5 mL/min (by C-G formula based on SCr of 1 mg/dL).   Medical History: Past Medical History:  Diagnosis Date   Allergy    Anemia    IV iron in past, prior to 2018   Asthma    Celiac disease    +prior biopsy   GERD (gastroesophageal reflux disease)    H/O bone density study 12/21/2017   normal   Hip fracture (HCC) 2008   right, Dr. Darrick Penna   History of stress test 2014   [facility]   Hypothyroidism    Migraine headache without aura    gets bad nausea.  does well on Naratriptan and Relpax.  Failed Imitrex, Maxalt   Stress fracture    severeral prior   Transfusion history    has had IV iron prior, Dr. Myna Hidalgo prior management    Assessment: 20 YOF presenting with abdominal pain, CT with mesenteric dissection and mural thrombus, she is not on anticoagulation PTA, pharmacy consulted to start heparin  Goal of Therapy:  Heparin level 0.3-0.5 units/ml Monitor platelets by anticoagulation protocol: Yes   Plan:  Heparin gtt at 1000 units/hr, no bolus F/u 6 hour heparin level   Daylene Posey, PharmD, [facility] Clinical Pharmacist ED Pharmacist Phone # [phone] 12/01/2023 6:39 PM

## 2023-12-02 DIAGNOSIS — I161 Hypertensive emergency: Secondary | ICD-10-CM | POA: Diagnosis not present

## 2023-12-02 DIAGNOSIS — I7779 Dissection of other artery: Secondary | ICD-10-CM | POA: Diagnosis not present

## 2023-12-02 LAB — CBC
HCT: 43.9 % (ref 36.0–46.0)
Hemoglobin: 15 g/dL (ref 12.0–15.0)
MCH: 32.5 pg (ref 26.0–34.0)
MCHC: 34.2 g/dL (ref 30.0–36.0)
MCV: 95 fL (ref 80.0–100.0)
Platelets: 226 10*3/uL (ref 150–400)
RBC: 4.62 MIL/uL (ref 3.87–5.11)
RDW: 11.7 % (ref 11.5–15.5)
WBC: 8.5 10*3/uL (ref 4.0–10.5)
nRBC: 0 % (ref 0.0–0.2)

## 2023-12-02 LAB — PHOSPHORUS: Phosphorus: 3.7 mg/dL (ref 2.5–4.6)

## 2023-12-02 LAB — COMPREHENSIVE METABOLIC PANEL
ALT: 24 U/L (ref 0–44)
AST: 20 U/L (ref 15–41)
Albumin: 3.1 g/dL — ABNORMAL LOW (ref 3.5–5.0)
Alkaline Phosphatase: 40 U/L (ref 38–126)
Anion gap: 13 (ref 5–15)
BUN: 8 mg/dL (ref 6–20)
CO2: 22 mmol/L (ref 22–32)
Calcium: 8.7 mg/dL — ABNORMAL LOW (ref 8.9–10.3)
Chloride: 100 mmol/L (ref 98–111)
Creatinine, Ser: 1.1 mg/dL — ABNORMAL HIGH (ref 0.44–1.00)
GFR, Estimated: >60 mL/min (ref >60)
Glucose, Bld: 81 mg/dL (ref 70–99)
Potassium: 3.7 mmol/L (ref 3.5–5.1)
Sodium: 135 mmol/L (ref 135–145)
Total Bilirubin: 0.6 mg/dL (ref 0.0–1.2)
Total Protein: 6.4 g/dL — ABNORMAL LOW (ref 6.5–8.1)

## 2023-12-02 LAB — LACTIC ACID, PLASMA
Lactic Acid, Venous: 1.3 mmol/L (ref 0.5–1.9)
Lactic Acid, Venous: 1.3 mmol/L (ref 0.5–1.9)
Lactic Acid, Venous: 1.6 mmol/L (ref 0.5–1.9)
Lactic Acid, Venous: 2.1 mmol/L (ref 0.5–1.9)

## 2023-12-02 LAB — GLUCOSE, CAPILLARY
Glucose-Capillary: 99 mg/dL (ref 70–99)
Glucose-Capillary: 85 mg/dL (ref 70–99)

## 2023-12-02 LAB — PROTIME-INR
INR: 1 (ref 0.8–1.2)
Prothrombin Time: 13.7 s (ref 11.4–15.2)

## 2023-12-02 LAB — C-REACTIVE PROTEIN: CRP: 5.1 mg/dL — ABNORMAL HIGH (ref <1.0)

## 2023-12-02 LAB — HIV ANTIBODY (ROUTINE TESTING W REFLEX): HIV Screen 4th Generation wRfx: NONREACTIVE

## 2023-12-02 LAB — SEDIMENTATION RATE: Sed Rate: 11 mm/h (ref 0–22)

## 2023-12-02 LAB — HEPARIN LEVEL (UNFRACTIONATED)
Heparin Unfractionated: 0.16 [IU]/mL — ABNORMAL LOW (ref 0.30–0.70)
Heparin Unfractionated: 0.44 [IU]/mL (ref 0.30–0.70)

## 2023-12-02 LAB — MAGNESIUM: Magnesium: 2.4 mg/dL (ref 1.7–2.4)

## 2023-12-02 MED ORDER — ALPRAZOLAM 0.25 MG PO TABS
0.2500 mg | ORAL_TABLET | Freq: Three times a day (TID) | ORAL | Status: AC
  Administered 2023-12-02 – 2023-12-04 (×4): 0.25 mg via ORAL
  Filled 2023-12-02 (×4): qty 1

## 2023-12-02 MED ORDER — OXYCODONE-ACETAMINOPHEN 5-325 MG PO TABS: 1.0000 | ORAL_TABLET | Freq: Four times a day (QID) | ORAL | Status: DC | PRN

## 2023-12-02 MED ORDER — TRAMADOL HCL 50 MG PO TABS: 100.0000 mg | ORAL_TABLET | Freq: Four times a day (QID) | ORAL | Status: DC | PRN

## 2023-12-02 MED ORDER — DEXAMETHASONE SODIUM PHOSPHATE 4 MG/ML IJ SOLN
4.0000 mg | Freq: Once | INTRAMUSCULAR | Status: AC
  Administered 2023-12-03: 1 mg via INTRAVENOUS
  Filled 2023-12-02: qty 1

## 2023-12-02 MED ORDER — DIPHENHYDRAMINE HCL 25 MG PO CAPS
25.0000 mg | ORAL_CAPSULE | Freq: Four times a day (QID) | ORAL | Status: DC | PRN
  Filled 2023-12-02 (×2): qty 1

## 2023-12-02 MED ORDER — ACETAMINOPHEN 325 MG PO TABS
650.0000 mg | ORAL_TABLET | ORAL | Status: AC | PRN
  Administered 2023-12-02 – 2023-12-03 (×8): 650 mg via ORAL
  Filled 2023-12-02 (×9): qty 2

## 2023-12-02 MED ORDER — HYOSCYAMINE SULFATE 0.125 MG SL SUBL
0.2500 mg | SUBLINGUAL_TABLET | SUBLINGUAL | Status: DC | PRN
  Administered 2023-12-02 – 2023-12-04 (×5): 0.25 mg via ORAL
  Filled 2023-12-02 (×9): qty 2

## 2023-12-02 MED ORDER — LACTATED RINGERS IV BOLUS: 250.0000 mL | Freq: Once | INTRAVENOUS | Status: DC

## 2023-12-02 MED ORDER — CHLORHEXIDINE GLUCONATE CLOTH 2 % EX PADS
6.0000 | MEDICATED_PAD | Freq: Every day | CUTANEOUS | Status: DC
  Administered 2023-12-02 – 2023-12-04 (×3): 6 via TOPICAL

## 2023-12-02 MED ORDER — POTASSIUM CHLORIDE 20 MEQ PO PACK
40.0000 meq | PACK | Freq: Once | ORAL | Status: AC
  Administered 2023-12-02: 40 meq via ORAL
  Filled 2023-12-02: qty 2

## 2023-12-02 MED ORDER — GUAIFENESIN ER 600 MG PO TB12
600.0000 mg | ORAL_TABLET | Freq: Two times a day (BID) | ORAL | Status: DC
  Administered 2023-12-03 – 2023-12-05 (×6): 600 mg via ORAL
  Filled 2023-12-02 (×5): qty 1

## 2023-12-02 MED ORDER — ALPRAZOLAM 0.25 MG PO TABS
0.2500 mg | ORAL_TABLET | Freq: Three times a day (TID) | ORAL | Status: DC | PRN
  Administered 2023-12-02: 0.25 mg via ORAL
  Filled 2023-12-02 (×2): qty 1

## 2023-12-02 MED ORDER — ALPRAZOLAM 0.25 MG PO TABS: 0.2500 mg | ORAL_TABLET | Freq: Three times a day (TID) | ORAL | Status: DC

## 2023-12-02 MED ORDER — CARVEDILOL 25 MG PO TABS
25.0000 mg | ORAL_TABLET | Freq: Two times a day (BID) | ORAL | Status: DC
  Administered 2023-12-02 – 2023-12-05 (×6): 25 mg via ORAL
  Filled 2023-12-02 (×6): qty 1

## 2023-12-02 MED ORDER — AMLODIPINE BESYLATE 10 MG PO TABS
10.0000 mg | ORAL_TABLET | Freq: Every day | ORAL | Status: DC
  Administered 2023-12-02 – 2023-12-05 (×4): 10 mg via ORAL
  Filled 2023-12-02 (×4): qty 1

## 2023-12-02 MED ORDER — CARVEDILOL 12.5 MG PO TABS: 12.5000 mg | ORAL_TABLET | Freq: Two times a day (BID) | ORAL | Status: DC

## 2023-12-02 NOTE — Progress Notes (Signed)
Pharmacy Electrolyte Replacement  Recent Labs:  Recent Labs    12/02/23 0230  K 3.7  MG 2.4  PHOS 3.7  CREATININE 1.10*    Plan: KCl PO x1  Vernard Gambles, PharmD, BCPS 12/02/2023 3:41 AM

## 2023-12-02 NOTE — Progress Notes (Signed)
  Progress Note    12/02/2023 10:12 AM   Subjective: No overnight issues, abdominal pain is only mild discomfort at this time  Vitals:   12/02/23 0945 12/02/23 1000  BP: 122/77 118/77  Pulse: 66 63  Resp: 17 16  Temp:    SpO2: 97% 96%    Physical Exam: Aaox3  Abdomen is soft Palpable pedal pulses  CBC    Component Value Date/Time   WBC 8.5 12/02/2023 0230   RBC 4.62 12/02/2023 0230   HGB 15.0 12/02/2023 0230   HGB 15.2 11/06/2023 1231   HGB 15.2 02/11/2008 1457   HCT 43.9 12/02/2023 0230   HCT 45.3 11/06/2023 1231   HCT 43.0 02/11/2008 1457   PLT 226 12/02/2023 0230   PLT 281 11/06/2023 1231   MCV 95.0 12/02/2023 0230   MCV 96 11/06/2023 1231   MCV 92.4 02/11/2008 1457   MCH 32.5 12/02/2023 0230   MCHC 34.2 12/02/2023 0230   RDW 11.7 12/02/2023 0230   RDW 11.9 11/06/2023 1231   RDW 12.2 02/11/2008 1457   LYMPHSABS 1.3 11/06/2023 1231   LYMPHSABS 1.2 02/11/2008 1457   MONOABS 0.5 02/27/2019 1056   MONOABS 0.3 02/11/2008 1457   EOSABS 0.3 11/06/2023 1231   BASOSABS 0.1 11/06/2023 1231   BASOSABS 0.0 02/11/2008 1457    BMET    Component Value Date/Time   NA 135 12/02/2023 0230   NA 135 11/06/2023 1231   K 3.7 12/02/2023 0230   CL 100 12/02/2023 0230   CO2 22 12/02/2023 0230   GLUCOSE 81 12/02/2023 0230   BUN 8 12/02/2023 0230   BUN 12 11/06/2023 1231   CREATININE 1.10 (H) 12/02/2023 0230   CREATININE 1.17 (H) 09/13/2019 0857   CALCIUM 8.7 (L) 12/02/2023 0230   GFRNONAA >60 12/02/2023 0230   GFRNONAA 56 (L) 09/13/2019 0857   GFRAA 86 12/18/2020 1402   GFRAA 65 09/13/2019 0857    INR    Component Value Date/Time   INR 1.0 12/02/2023 0230     Intake/Output Summary (Last 24 hours) at 12/02/2023 1012 Last data filed at 12/02/2023 0901 Gross per 24 hour  Intake 1580.48 ml  Output 1345 ml  Net 235.48 ml     Assessment:  48 y.o. female is here with sma dissection  Plan: Continue bp management  Heparin gtt  Discussed plan to avoid  intervention if at all possible. Rescan only if no improvement while she is inpatient   Garnie Borchardt C. Randie Heinz, MD [facility] Office: [phone] Pager: [phone]  12/02/2023 10:12 AM

## 2023-12-02 NOTE — Progress Notes (Signed)
NAME:  Barbara Odonnell, MRN:  [id number], DOB:  07-26-1976, LOS: 1 ADMISSION DATE:  12/01/2023, CONSULTATION DATE:  12/01/23 REFERRING MD:  Dr. Silverio Lay, CHIEF COMPLAINT: SMA dissection  History of Present Illness:  LENNYN Odonnell is a 48 year old female with a past medical history significant for asthma, celiac's disease, hypothyroidism, and anemia who presented to the ED for complaints of left flank and low back pain.  On ED admission she was seen severely hypertensive with BP 142/103.  Labs significant for sodium 134, chloride 94, glucose 105.  CT a abdomen and pelvis revealed dissection of the SMA with areas of mural thrombus within the false lumen.  Vascular consulted with no urgent indications for surgery, recommended tight BP control.  PCCM consulted for further management admission.  Pertinent  Medical History  Asthma, celiac's disease, hypothyroidism, and anemia   Significant Hospital Events: Including procedures, antibiotic start and stop dates in addition to other pertinent events   1/31 presented with lower abdominal/flank pain with low back pain.  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  Seen by vascular surgery.  Recommending blood pressure control, pain control, and slow progression of diet. 2/1 had episode of hypotension following fentanyl last night lactate slightly elevated  Interim History / Subjective:  No distress.  Feels like her abdomen is "full of gas" but better.  Back pain is better. Objective   Blood pressure 113/83, pulse 81, temperature 98.1 F (36.7 C), temperature source Oral, resp. rate 17, height 5\' 7"  (1.702 m), weight 68.3 kg, SpO2 97%.        Intake/Output Summary (Last 24 hours) at 12/02/2023 5284 Last data filed at 12/02/2023 0700 Gross per 24 hour  Intake 1446.74 ml  Output 1345 ml  Net 101.74 ml   Filed Weights   12/01/23 0953 12/01/23 1937 12/02/23 0500  Weight: 69.4 kg 68.3 kg 68.3 kg    Examination:  General pleasant 48 year old  female patient she sitting up in bed and in no acute distress this morning HEENT normal cephalic atraumatic no jugular venous distention is appreciated Pulmonary: Clear to auscultation Cardiac: Regular rate and rhythm Abdomen: Soft no tenderness bowel sounds present Extremities warm dry with brisk capillary refill neuro awake oriented does have a headache. 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  Seen by vascular surgery  Plan Tight blood pressure control with goal blood pressure goal less than 140 and heart rate goal less than 80, remains on both Cleviprex and esmolol  Will start scheduled BB and CCB today  Continue telemetry monitoring Monitor pain and how she tolerates diet Continuing anticoagulation  Lactic acidosis.  No significant pain.  Would raise concern for bowel ischemia Initially had been normal.  Elevated this morning Plan Continue to trend  At risk for AKI Plan BP control A.m. chemistry  History of asthma and GERD Plan Continue Dulera in place of her home Symbicort  Continue as needed bronchodilators Continue Pepcid  Migraine headache Plan Added back Percocet As needed tramadol  Hypothyroidism Plan Continue home Synthroid   History of celiac's disease Plan F/u outpt   Best Practice (right click and "Reselect all SmartList Selections" daily)   Diet/type: Regular consistency (see orders) DVT prophylaxis systemic heparin Pressure ulcer(s): N/A GI prophylaxis: PPI Lines: N/A Foley:  N/A Code Status:  full code Last date of multidisciplinary goals of care discussion  Critical care time:   CRITICAL CARE Performed by: Shelby Mattocks  Total critical care time: 34 min

## 2023-12-02 NOTE — Plan of Care (Signed)

## 2023-12-02 NOTE — Care Plan (Signed)
Pt became restless, diaphoretic, hypotensive/bradycardic ~52min after giving her a PRN dose of fentanyl. Clevidipine and Esmolol drips that had been running at the same rate for a few hours to maintain SBP 100-120 and HR <60 were stopped. No changes had been made to the IV lines/tubing. Patient vitals returned to baseline about 10 min later without further interventions. Patient has asked not to be given any more fentanyl.

## 2023-12-02 NOTE — Progress Notes (Signed)
F/u lactate cleared Plan Cont rx as outlined this am

## 2023-12-02 NOTE — Progress Notes (Signed)
PHARMACY - ANTICOAGULATION CONSULT NOTE  Pharmacy Consult for heparin Indication:  mural thrombus in setting of SMA dissection  Labs: Recent Labs    12/01/23 1013 12/02/23 0230  HGB 16.0* 15.0  HCT 46.3* 43.9  PLT 264 226  LABPROT  --  13.7  INR  --  1.0  HEPARINUNFRC  --  0.16*  CREATININE 1.00 1.10*   Assessment: 48yo female subtherapeutic on heparin with initial dosing for mural thrombus; no infusion issues or signs of bleeding per RN.  Goal of Therapy:  Heparin level 0.3-0.5 units/ml   Plan:  Increase heparin infusion by 3 units/kg/hr to 1200 units/hr. Check level in 6 hours.   Vernard Gambles, PharmD, BCPS 12/02/2023 3:42 AM

## 2023-12-02 NOTE — Progress Notes (Signed)
PHARMACY - ANTICOAGULATION CONSULT NOTE  Pharmacy Consult for heparin Indication: SMA dissection /  thrombus  Allergies  Allergen Reactions   Sulfa Antibiotics Anaphylaxis   Gluten Meal     Celiac Disease   Montelukast Other (See Comments)    Angry, irritable, out of skin feeling   Other Hives    Dark alcohol   Singulair [Montelukast Sodium]     Angry, irritable, out of skin feeling    Patient Measurements: Height: 5\' 7"  (170.2 cm) Weight: 68.3 kg (150 lb 9.2 oz) IBW/kg (Calculated) : 61.6 Heparin Dosing Weight: 69kg  Vital Signs: Temp: 98.1 F (36.7 C) (02/01 1145) Temp Source: Oral (02/01 1145) BP: 108/68 (02/01 1245) Pulse Rate: 82 (02/01 1115)  Labs: Recent Labs    12/01/23 1013 12/02/23 0230 12/02/23 1051  HGB 16.0* 15.0  --   HCT 46.3* 43.9  --   PLT 264 226  --   LABPROT  --  13.7  --   INR  --  1.0  --   HEPARINUNFRC  --  0.16* 0.44  CREATININE 1.00 1.10*  --     Estimated Creatinine Clearance: 59.5 mL/min (A) (by C-G formula based on SCr of 1.1 mg/dL (H)).   Medical History: Past Medical History:  Diagnosis Date   Allergy    Anemia    IV iron in past, prior to 2018   Asthma    Celiac disease    +prior biopsy   GERD (gastroesophageal reflux disease)    H/O bone density study 12/21/2017   normal   Hip fracture (HCC) 2008   right, Dr. Darrick Penna   History of stress test 2014   [facility]   Hypothyroidism    Migraine headache without aura    gets bad nausea.  does well on Naratriptan and Relpax.  Failed Imitrex, Maxalt   Stress fracture    severeral prior   Transfusion history    has had IV iron prior, Dr. Myna Hidalgo prior management    Assessment: 16 YOF presenting with abdominal pain, CT with mesenteric dissection and mural thrombus, she is not on anticoagulation PTA, pharmacy consulted to start heparin  Heparin drip 1200 uts/hr with heparin level 0.44 at goal Cbc stable no bleeding noted  Monitor BB and pain if no procedures  - discuss  plan for oral anticoagulation   Goal of Therapy:  Heparin level 0.3-0.5 units/ml Monitor platelets by anticoagulation protocol: Yes   Plan:  Continue Heparin gtt at 1200 units/hr, no bolus Daily heparin level and cbc      Leota Sauers Pharm.D. CPP, BCPS Clinical Pharmacist [phone] 12/02/2023 1:55 PM

## 2023-12-02 NOTE — Progress Notes (Signed)
eLink Physician-Brief Progress Note Patient Name: Barbara Odonnell DOB: [birth year] MRN: [id number]   Date of Service  12/02/2023  HPI/Events of Note  Patient with a history of celiac disease who initially presented for SMA dissection and thrombus currently on systemic heparin.  Has  non-productive cough  eICU Interventions  Add Mucinex.     Intervention Category Minor Interventions: Routine modifications to care plan (e.g. PRN medications for pain, fever)  Toris Laverdiere 12/02/2023, 9:45 PM

## 2023-12-03 DIAGNOSIS — I161 Hypertensive emergency: Secondary | ICD-10-CM

## 2023-12-03 DIAGNOSIS — I7779 Dissection of other artery: Secondary | ICD-10-CM | POA: Diagnosis not present

## 2023-12-03 LAB — CBC
HCT: 44.4 % (ref 36.0–46.0)
Hemoglobin: 15.2 g/dL — ABNORMAL HIGH (ref 12.0–15.0)
MCH: 32 pg (ref 26.0–34.0)
MCHC: 34.2 g/dL (ref 30.0–36.0)
MCV: 93.5 fL (ref 80.0–100.0)
Platelets: 271 10*3/uL (ref 150–400)
RBC: 4.75 MIL/uL (ref 3.87–5.11)
RDW: 11.7 % (ref 11.5–15.5)
WBC: 7.5 10*3/uL (ref 4.0–10.5)
nRBC: 0 % (ref 0.0–0.2)

## 2023-12-03 LAB — BASIC METABOLIC PANEL
Anion gap: 11 (ref 5–15)
BUN: 17 mg/dL (ref 6–20)
CO2: 25 mmol/L (ref 22–32)
Calcium: 9.3 mg/dL (ref 8.9–10.3)
Chloride: 97 mmol/L — ABNORMAL LOW (ref 98–111)
Creatinine, Ser: 1.11 mg/dL — ABNORMAL HIGH (ref 0.44–1.00)
GFR, Estimated: >60 mL/min (ref >60)
Glucose, Bld: 105 mg/dL — ABNORMAL HIGH (ref 70–99)
Potassium: 4.4 mmol/L (ref 3.5–5.1)
Sodium: 133 mmol/L — ABNORMAL LOW (ref 135–145)

## 2023-12-03 LAB — HEPARIN LEVEL (UNFRACTIONATED): Heparin Unfractionated: 0.57 [IU]/mL (ref 0.30–0.70)

## 2023-12-03 LAB — MAGNESIUM: Magnesium: 2.3 mg/dL (ref 1.7–2.4)

## 2023-12-03 LAB — TRIGLYCERIDES: Triglycerides: 93 mg/dL (ref <150)

## 2023-12-03 MED ORDER — DIPHENHYDRAMINE HCL 50 MG/ML IJ SOLN: 12.5000 mg | Freq: Four times a day (QID) | INTRAMUSCULAR | Status: DC | PRN

## 2023-12-03 MED ORDER — CALCIUM CARBONATE ANTACID 500 MG PO CHEW
200.0000 mg | CHEWABLE_TABLET | Freq: Three times a day (TID) | ORAL | Status: DC | PRN
  Administered 2023-12-03 – 2023-12-04 (×3): 200 mg via ORAL
  Filled 2023-12-03 (×3): qty 1

## 2023-12-03 MED ORDER — PANTOPRAZOLE SODIUM 40 MG PO TBEC
40.0000 mg | DELAYED_RELEASE_TABLET | Freq: Two times a day (BID) | ORAL | Status: DC
  Administered 2023-12-03 – 2023-12-05 (×4): 40 mg via ORAL
  Filled 2023-12-03 (×4): qty 1

## 2023-12-03 MED ORDER — NARATRIPTAN HCL 2.5 MG PO TABS
2.5000 mg | ORAL_TABLET | Freq: Once | ORAL | Status: AC | PRN
  Administered 2023-12-03: 2.5 mg via ORAL

## 2023-12-03 MED ORDER — CYCLOBENZAPRINE HCL 10 MG PO TABS: 5.0000 mg | ORAL_TABLET | Freq: Three times a day (TID) | ORAL | Status: DC | PRN

## 2023-12-03 MED ORDER — CALCIUM CARBONATE ANTACID 500 MG PO CHEW
800.0000 mg | CHEWABLE_TABLET | Freq: Once | ORAL | Status: AC
  Administered 2023-12-03: 800 mg via ORAL
  Filled 2023-12-03: qty 4

## 2023-12-03 MED ORDER — SIMETHICONE 80 MG PO CHEW
80.0000 mg | CHEWABLE_TABLET | Freq: Two times a day (BID) | ORAL | Status: DC | PRN
  Administered 2023-12-03: 80 mg via ORAL
  Filled 2023-12-03: qty 1

## 2023-12-03 MED ORDER — ONDANSETRON 4 MG PO TBDP
4.0000 mg | ORAL_TABLET | Freq: Three times a day (TID) | ORAL | Status: DC | PRN
  Administered 2023-12-03 – 2023-12-04 (×2): 4 mg via ORAL
  Filled 2023-12-03 (×3): qty 1

## 2023-12-03 MED ORDER — NARATRIPTAN HCL 2.5 MG PO TABS: 2.5000 mg | ORAL_TABLET | Freq: Once | ORAL | Status: DC | PRN

## 2023-12-03 MED ORDER — BISACODYL 5 MG PO TBEC
5.0000 mg | DELAYED_RELEASE_TABLET | Freq: Every day | ORAL | Status: DC | PRN
  Administered 2023-12-03: 5 mg via ORAL
  Filled 2023-12-03: qty 1

## 2023-12-03 NOTE — Progress Notes (Signed)
   NAME:  Barbara Odonnell, MRN:  [id number], DOB:  January 11, 1976, LOS: 2 ADMISSION DATE:  12/01/2023, CONSULTATION DATE:  12/01/23 REFERRING MD:  Dr. Silverio Lay, CHIEF COMPLAINT: SMA dissection  History of Present Illness:  Barbara Odonnell is a 48 year old female with a past medical history significant for asthma, celiac's disease, hypothyroidism, and anemia who presented to the ED for complaints of left flank and low back pain.  On ED admission she was seen severely hypertensive with BP 142/103.  Labs significant for sodium 134, chloride 94, glucose 105.  CT a abdomen and pelvis revealed dissection of the SMA with areas of mural thrombus within the false lumen.  Vascular consulted with no urgent indications for surgery, recommended tight BP control.  PCCM consulted for further management admission.  Pertinent  Medical History  Asthma, celiac's disease, hypothyroidism, and anemia   Significant Hospital Events: Including procedures, antibiotic start and stop dates in addition to other pertinent events   1/31 presented with lower abdominal/flank pain with low back pain.  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  Seen by vascular surgery.  Recommending blood pressure control, pain control, and slow progression of diet. 2/1 had episode of hypotension following fentanyl last night lactate slightly elevated  Interim History / Subjective:  Migraine this morning   Objective   Blood pressure 126/88, pulse 67, temperature 98.2 F (36.8 C), temperature source Oral, resp. rate 18, height 5\' 7"  (1.702 m), weight 69.9 kg, SpO2 95%.        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  Plan -hep gtt  -SBP goal < 140 HR < 80 -norvasc, coreg  -dc clevi and esmolol orders  -will follow renal fxn panels    Hx asthma Hx GERD  Plan -dulera  -Pepcid  Migraine  Plan -see if we can verify her home triptans -- she can't take what we have on formulary -adding PRN flexiril and benadryl  -PRN tramadol, percocet, APAP, morphine   Hypothyroidism  Plan -synthroid   Hx celiac  Hx constipation  Plan -requested changing to dulcolax which she prefers at home, will do  -outpt fu   Dispo: will dw VVS -- think she can likely txf out of ICU 2/2   Best Practice (right click and "Reselect all SmartList Selections" daily)   Diet/type: Regular consistency (see orders) DVT prophylaxis systemic heparin Pressure ulcer(s): N/A GI prophylaxis: PPI Lines: N/A Foley:  N/A Code Status:  full code Last date of multidisciplinary goals of care discussion    CCT na  Tessie Fass MSN, AGACNP-BC [facility] Pulmonary/Critical Care Medicine Amion for pager 12/03/2023, 11:47 AM

## 2023-12-03 NOTE — Progress Notes (Signed)
PHARMACY - ANTICOAGULATION CONSULT NOTE  Pharmacy Consult for heparin Indication: SMA dissection /  thrombus  Allergies  Allergen Reactions   Sulfa Antibiotics Anaphylaxis   Gluten Meal     Celiac Disease   Montelukast Other (See Comments)    Angry, irritable, out of skin feeling   Other Hives    Dark alcohol   Singulair [Montelukast Sodium]     Angry, irritable, out of skin feeling    Patient Measurements: Height: 5\' 7"  (170.2 cm) Weight: 69.9 kg (154 lb 1.6 oz) IBW/kg (Calculated) : 61.6 Heparin Dosing Weight: 69kg  Vital Signs: Temp: 98.3 F (36.8 C) (02/02 1200) Temp Source: Oral (02/02 1200) BP: 122/89 (02/02 1221) Pulse Rate: 67 (02/02 1100)  Labs: Recent Labs    12/01/23 1013 12/02/23 0230 12/02/23 1051 12/03/23 0215  HGB 16.0* 15.0  --  15.2*  HCT 46.3* 43.9  --  44.4  PLT 264 226  --  271  LABPROT  --  13.7  --   --   INR  --  1.0  --   --   HEPARINUNFRC  --  0.16* 0.44 0.57  CREATININE 1.00 1.10*  --  1.11*    Estimated Creatinine Clearance: 59 mL/min (A) (by C-G formula based on SCr of 1.11 mg/dL (H)).   Medical History: Past Medical History:  Diagnosis Date   Allergy    Anemia    IV iron in past, prior to 2018   Asthma    Celiac disease    +prior biopsy   GERD (gastroesophageal reflux disease)    H/O bone density study 12/21/2017   normal   Hip fracture (HCC) 2008   right, Dr. Darrick Penna   History of stress test 2014   [facility]   Hypothyroidism    Migraine headache without aura    gets bad nausea.  does well on Naratriptan and Relpax.  Failed Imitrex, Maxalt   Stress fracture    severeral prior   Transfusion history    has had IV iron prior, Dr. 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 CPP, BCPS Clinical Pharmacist [phone] 12/03/2023 1:40 PM

## 2023-12-03 NOTE — Progress Notes (Signed)
Progress Note    12/03/2023 11:34 AM * No surgery found *  Subjective: Frustrated that she did not get her migraine medication and wants to leave, abdomen is feeling better and she is tolerating at least minimal amounts of p.o. intake without pain  Vitals:   12/03/23 1000 12/03/23 1100  BP: (!) 129/95 126/88  Pulse: 76 67  Resp: (!) 21 18  Temp:    SpO2: 97% 95%    Physical Exam: Awake alert and oriented Nonlabored respirations Abdomen is soft without tenderness to palpation  CBC    Component Value Date/Time   WBC 7.5 12/03/2023 0215   RBC 4.75 12/03/2023 0215   HGB 15.2 (H) 12/03/2023 0215   HGB 15.2 11/06/2023 1231   HGB 15.2 02/11/2008 1457   HCT 44.4 12/03/2023 0215   HCT 45.3 11/06/2023 1231   HCT 43.0 02/11/2008 1457   PLT 271 12/03/2023 0215   PLT 281 11/06/2023 1231   MCV 93.5 12/03/2023 0215   MCV 96 11/06/2023 1231   MCV 92.4 02/11/2008 1457   MCH 32.0 12/03/2023 0215   MCHC 34.2 12/03/2023 0215   RDW 11.7 12/03/2023 0215   RDW 11.9 11/06/2023 1231   RDW 12.2 02/11/2008 1457   LYMPHSABS 1.3 11/06/2023 1231   LYMPHSABS 1.2 02/11/2008 1457   MONOABS 0.5 02/27/2019 1056   MONOABS 0.3 02/11/2008 1457   EOSABS 0.3 11/06/2023 1231   BASOSABS 0.1 11/06/2023 1231   BASOSABS 0.0 02/11/2008 1457    BMET    Component Value Date/Time   NA 133 (L) 12/03/2023 0215   NA 135 11/06/2023 1231   K 4.4 12/03/2023 0215   CL 97 (L) 12/03/2023 0215   CO2 25 12/03/2023 0215   GLUCOSE 105 (H) 12/03/2023 0215   BUN 17 12/03/2023 0215   BUN 12 11/06/2023 1231   CREATININE 1.11 (H) 12/03/2023 0215   CREATININE 1.17 (H) 09/13/2019 0857   CALCIUM 9.3 12/03/2023 0215   GFRNONAA >60 12/03/2023 0215   GFRNONAA 56 (L) 09/13/2019 0857   GFRAA 86 12/18/2020 1402   GFRAA 65 09/13/2019 0857    INR    Component Value Date/Time   INR 1.0 12/02/2023 0230     Intake/Output Summary (Last 24 hours) at 12/03/2023 1134 Last data filed at 12/03/2023 1100 Gross per 24 hour   Intake 1264.69 ml  Output 1450 ml  Net -185.31 ml     Assessment/plan:  48 y.o. female is here with a spontaneous SMA dissection.  Her esr is mildly elevated but unlikely at the level of vasculitis and she does have normal platelet counts without any evidence of leukocytosis since admission and lactic acid has cleared.  Other differential diagnoses would include spontaneous arterial medial lysis, fibromuscular dysplasia (there is no evidence of fibromuscular dysplasia elsewhere on the CT) or collagen vascular disease.  She would ultimately benefit from a rheumatologic workup as an outpatient.  I have discussed the with the patient the importance of hospitalization to control her blood pressure on p.o. medications prior to discharge and we discussed the possible consequences if her blood pressure is not controlled when she is discharged including readmission with possible worsened dissection and requirement for endovascular or open intervention.  She can have p.o. as tolerated and would like her to be tolerating a general diet without pain prior to discharge.  We again discussed that any setback in her course would require repeat CT angio of her abdomen to further evaluate the SMA dissection evolution.  Preferably we could  wait 4 to 6 weeks to evaluate with CTA as an outpatient.  She continues on heparin drip and will plan to convert to DOAC and aspirin prior to discharge.  Overall she is progressing as expected and is now off Cleviprex and esmolol but blood pressure remains somewhat elevated on amlodipine and Coreg.  All of her questions were answered in the presence of her mother and she demonstrates good understanding.    I will continue to closely follow while she is in the hospital.   Apolinar Junes C. Randie Heinz, MD [facility] Office: [phone] Pager: [phone]  12/03/2023 11:34 AM

## 2023-12-03 NOTE — Progress Notes (Signed)
eLink Physician-Brief Progress Note Patient Name: Barbara Odonnell DOB: Jan 01, 1976 MRN: [id number]   Date of Service  12/03/2023  HPI/Events of Note  48 year old female here with spontaneous SMA dissection with intramural thrombosis and hypertensive emergency   Complaining of acid reflux - already on Famotidine and tried tums.  eICU Interventions  1x Tums and switch to PPI    0108 -continues to have significant abdominal discomfort, back discomfort and received one-time dose of Decadron with relief on the morning of 2/1.  Will try to repeat the dose.  Given persistent symptoms that seem to be worsened with food, there would be concern for mesenteric ischemia.  May benefit from a repeat CT angiography today as discussed in the vascular notes.  Maintain n.p.o. for now.   8295 -patient concerned that she is unable to completely empty her bladder.  0 on the bladder scan.  Urinalysis with next urine collection  Intervention Category Minor Interventions: Routine modifications to care plan (e.g. PRN medications for pain, fever)  Duanne Duchesne 12/03/2023, 10:50 PM

## 2023-12-03 NOTE — Plan of Care (Signed)
   Problem: Education: Goal: Knowledge of General Education information will improve Description Including pain rating scale, medication(s)/side effects and non-pharmacologic comfort measures Outcome: Progressing

## 2023-12-04 ENCOUNTER — Encounter: Payer: Self-pay | Admitting: Acute Care

## 2023-12-04 ENCOUNTER — Telehealth (HOSPITAL_COMMUNITY): Payer: Self-pay | Admitting: Pharmacy Technician

## 2023-12-04 ENCOUNTER — Other Ambulatory Visit (HOSPITAL_COMMUNITY): Payer: Self-pay

## 2023-12-04 DIAGNOSIS — I160 Hypertensive urgency: Secondary | ICD-10-CM

## 2023-12-04 DIAGNOSIS — I7779 Dissection of other artery: Secondary | ICD-10-CM | POA: Diagnosis not present

## 2023-12-04 LAB — URINALYSIS, ROUTINE W REFLEX MICROSCOPIC
Bilirubin Urine: NEGATIVE
Glucose, UA: NEGATIVE mg/dL
Hgb urine dipstick: NEGATIVE
Ketones, ur: NEGATIVE mg/dL
Leukocytes,Ua: NEGATIVE
Nitrite: NEGATIVE
Protein, ur: NEGATIVE mg/dL
Specific Gravity, Urine: 1.016 (ref 1.005–1.030)
pH: 8 (ref 5.0–8.0)

## 2023-12-04 LAB — CBC
HCT: 44.3 % (ref 36.0–46.0)
Hemoglobin: 15.4 g/dL — ABNORMAL HIGH (ref 12.0–15.0)
MCH: 32.1 pg (ref 26.0–34.0)
MCHC: 34.8 g/dL (ref 30.0–36.0)
MCV: 92.3 fL (ref 80.0–100.0)
Platelets: 313 10*3/uL (ref 150–400)
RBC: 4.8 MIL/uL (ref 3.87–5.11)
RDW: 11.7 % (ref 11.5–15.5)
WBC: 14.5 10*3/uL — ABNORMAL HIGH (ref 4.0–10.5)
nRBC: 0 % (ref 0.0–0.2)

## 2023-12-04 LAB — GLUCOSE, CAPILLARY: Glucose-Capillary: 101 mg/dL — ABNORMAL HIGH (ref 70–99)

## 2023-12-04 LAB — HEPARIN LEVEL (UNFRACTIONATED): Heparin Unfractionated: 0.55 [IU]/mL (ref 0.30–0.70)

## 2023-12-04 MED ORDER — ASPIRIN 81 MG PO TBEC
81.0000 mg | DELAYED_RELEASE_TABLET | Freq: Every day | ORAL | Status: DC
  Administered 2023-12-04 – 2023-12-05 (×2): 81 mg via ORAL
  Filled 2023-12-04 (×2): qty 1

## 2023-12-04 MED ORDER — DEXAMETHASONE SODIUM PHOSPHATE 4 MG/ML IJ SOLN
4.0000 mg | Freq: Once | INTRAMUSCULAR | Status: AC
  Administered 2023-12-04: 4 mg via INTRAVENOUS
  Filled 2023-12-04: qty 1

## 2023-12-04 NOTE — Plan of Care (Signed)

## 2023-12-04 NOTE — Progress Notes (Signed)
 Pt arrived from ...2H.., A/ox .4.Marland Kitchenpt denies any pain, MD aware,CCMD called. CHG bath given,no further needs at this time

## 2023-12-04 NOTE — Progress Notes (Signed)
PHARMACY - ANTICOAGULATION CONSULT NOTE  Pharmacy Consult for heparin Indication: SMA dissection /  thrombus  Allergies  Allergen Reactions   Sulfa Antibiotics Anaphylaxis   Gluten Meal     Celiac Disease   Montelukast Other (See Comments)    Angry, irritable, out of skin feeling   Other Hives    Dark alcohol   Singulair [Montelukast Sodium]     Angry, irritable, out of skin feeling    Patient Measurements: Height: 5\' 7"  (170.2 cm) Weight: 69.9 kg (154 lb 1.6 oz) IBW/kg (Calculated) : 61.6 Heparin Dosing Weight: 69kg  Vital Signs: Temp: 98.2 F (36.8 C) (02/03 0400) Temp Source: Oral (02/03 0400) BP: 123/81 (02/03 0804) Pulse Rate: 65 (02/03 0400)  Labs: Recent Labs    12/01/23 1013 12/01/23 1013 12/02/23 0230 12/02/23 1051 12/03/23 0215 12/04/23 0328  HGB 16.0*  --  15.0  --  15.2* 15.4*  HCT 46.3*  --  43.9  --  44.4 44.3  PLT 264  --  226  --  271 313  LABPROT  --   --  13.7  --   --   --   INR  --   --  1.0  --   --   --   HEPARINUNFRC  --    < > 0.16* 0.44 0.57 0.55  CREATININE 1.00  --  1.10*  --  1.11*  --    < > = values in this interval not displayed.    Estimated Creatinine Clearance: 59 mL/min (A) (by C-G formula based on SCr of 1.11 mg/dL (H)).   Medical History: Past Medical History:  Diagnosis Date   Allergy    Anemia    IV iron in past, prior to 2018   Asthma    Celiac disease    +prior biopsy   GERD (gastroesophageal reflux disease)    H/O bone density study 12/21/2017   normal   Hip fracture (HCC) 2008   right, Dr. Darrick Penna   History of stress test 2014   [facility]   Hypothyroidism    Migraine headache without aura    gets bad nausea.  does well on Naratriptan and Relpax.  Failed Imitrex, Maxalt   Stress fracture    severeral prior   Transfusion history    has had IV iron prior, Dr. 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  Reece Leader, Colon Flattery, BCCP Clinical Pharmacist  12/04/2023 9:36 AM   [facility] pharmacy phone numbers are listed on [email or web address]

## 2023-12-04 NOTE — Progress Notes (Addendum)
      Subjective  - She states she has the same pain that brought her to the ED last night and after eating the acid reflux was sever.  She is very concerned about have discomfort once she goes home and knowing what is the cause.     Objective 109/88 65 98.2 F (36.8 C) (Oral) (!) 28 95%  Intake/Output Summary (Last 24 hours) at 12/04/2023 0708 Last data filed at 12/04/2023 0400 Gross per 24 hour  Intake 251.89 ml  Output --  Net 251.89 ml   Lungs non labored breathing Moving all extremities Abdomin non distended, soft   Assessment/Planning: 48 y.o. female is here with a spontaneous SMA dissection.   Reviewing Dr. Darcella Cheshire note: Other differential diagnoses would include spontaneous arterial medial lysis, fibromuscular dysplasia (there is no evidence of fibromuscular dysplasia elsewhere on the CT) or collagen vascular disease. She would ultimately benefit from a rheumatologic workup as an outpatient.   She is very scared that she will lose blood flow to her intestines and need emergency prednisone.  She is scared her intestine will die.  I attempted to explain her pain/discomfort can be from multiple sources.  She has a list of questions for DR. Randie Heinz.  The Steroids do make her pain subside.     Currently working on conservative management: BP control is very important and f/u with CTA of her abdomen to further evaluate the SMA dissection evolution. Preferably we could wait 4 to 6 weeks to evaluate with CTA as an outpatient. She continues on heparin drip and will plan to convert to DOAC and aspirin prior to discharge.   Mild leukocytosis 14.5, Cr 1.11 with good urine OP > 1800 Improved BP control   Mosetta Pigeon 12/04/2023 7:08 AM --  Laboratory Lab Results: Recent Labs    12/03/23 0215 12/04/23 0328  WBC 7.5 14.5*  HGB 15.2* 15.4*  HCT 44.4 44.3  PLT 271 313   BMET Recent Labs    12/02/23 0230 12/03/23 0215  NA 135 133*  K 3.7 4.4  CL 100 97*  CO2 22 25   GLUCOSE 81 105*  BUN 8 17  CREATININE 1.10* 1.11*  CALCIUM 8.7* 9.3    COAG Lab Results  Component Value Date   INR 1.0 12/02/2023   No results found for: "PTT"  I have independently interviewed and examined patient and agree with PA assessment and plan above.  Blood pressure now very well-controlled Norvasc and Coreg and abdominal pain has resolved.  I have again reiterated the possible underlying causes and the need for strict blood pressure and heart rate control particularly in the next 4 to 6 weeks which would preclude any strenuous activity.  I have ordered aspirin and she will need DOAC prior to discharge for at least a few months.  I will have her follow-up in my office with CTA in 4 to 6 weeks.  Lilyona Richner C. Randie Heinz, MD [facility] Office: [phone] Pager: [phone]

## 2023-12-04 NOTE — TOC Initial Note (Signed)
Transition of Care [facility]) - Initial/Assessment Note    Patient Details  Name: Barbara Odonnell MRN: [id number] Date of Birth: [birth year]  [facility] CM/SW Contact:    Elliot Cousin, RN Phone Number: [phone] 12/04/2023, 12:14 PM  Clinical Narrative:                 TOC CM spoke  to pt at bedside. Mother at home to assist with care and provide transportation home. She is independent pta. Pt states she will need note for work. Pt has scheduled appt to see her PCP for hospital follow up. Will make aware from attending pt will need to have PCP do referral to GI, Dr [address]   Expected Discharge Plan: Home/Self Care Barriers to Discharge: No Barriers Identified   Patient Goals and CMS Choice Patient states their goals for this hospitalization and ongoing recovery are:: wants to get better          Expected Discharge Plan and Services   Discharge Planning Services: CM Consult   Living arrangements for the past 2 months: Single Family Home                                      Prior Living Arrangements/Services Living arrangements for the past 2 months: Single Family Home Lives with:: Self Patient language and need for interpreter reviewed:: Yes Do you feel safe going back to the place where you live?: Yes      Need for Family Participation in Patient Care: No (Comment) Care giver support system in place?: No (comment)   Criminal Activity/Legal Involvement Pertinent to Current Situation/Hospitalization: No - Comment as needed  Activities of Daily Living   ADL Screening (condition at time of admission) Independently performs ADLs?: Yes (appropriate for developmental age) Is the patient deaf or have difficulty hearing?: No Does the patient have difficulty seeing, even when wearing glasses/contacts?: No Does the patient have difficulty concentrating, remembering, or making decisions?: No  Permission Sought/Granted Permission sought to share  information with : Case Manager, PCP, Family Supports Permission granted to share information with : Yes, Verbal Permission Granted  Share Information with NAME: Juli Odom     Permission granted to share info w Relationship: mother  Permission granted to share info w Contact Information: [phone]  Emotional Assessment Appearance:: Appears stated age Attitude/Demeanor/Rapport: Engaged Affect (typically observed): Accepting Orientation: : Oriented to Self, Oriented to Place, Oriented to  Time, Oriented to Situation   Psych Involvement: No (comment)  Admission diagnosis:  Generalized abdominal pain [R10.84] 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] Hypertensive emergency [I16.1] Patient Active Problem List   Diagnosis Date Noted   Hypertensive emergency 12/03/2023   Dissection of mesenteric artery (HCC) 12/01/2023   Chronic migraine without aura, with intractable migraine, so stated, with status migrainosus 06/26/2023   Food allergy 03/24/2022   Chronic nausea 06/02/2021   Gastroesophageal reflux disease 06/02/2021   Moderate persistent asthma 06/02/2021   History of COVID-19 03/16/2021   Hair loss 03/16/2021   History of hip fracture 09/15/2020   Encounter for health maintenance examination in adult 09/15/2020   Screening for lipid disorders 09/15/2020   Hives 09/15/2020   History of Epstein-Barr virus infection 01/08/2020   Chronic fatigue 10/23/2019   Raynaud's phenomenon without gangrene 09/24/2019   Pruritic condition 09/24/2019   Hx of colonic polyp 09/13/2019   Vaccine counseling 03/11/2019   History  of colitis 03/11/2019   Allergic rhinitis due to pollen 02/04/2019   Family history of osteoporosis 11/28/2017   History of stress fracture 11/28/2017   Hypothyroidism 10/08/2015   Chronic migraine without aura without status migrainosus, not intractable 10/08/2015   Celiac disease 10/08/2015   Vitamin D deficiency 10/08/2015   Iron deficiency anemia  10/08/2015   PCP:  Jac Canavan, PA-C Pharmacy:   [facility] [phone] - [place], [place] - [address] AT [address] OF [facility] [address] [place] [address] Phone: [phone] Fax: [phone]     Social Drivers of Health (SDOH) Social History: SDOH Screenings   Food Insecurity: No Food Insecurity (12/01/2023)  Recent Concern: Food Insecurity - Food Insecurity Present (11/06/2023)  Housing: Low Risk  (12/01/2023)  Transportation Needs: No Transportation Needs (12/01/2023)  Utilities: Not At Risk (12/01/2023)  Alcohol Screen: Low Risk  (11/06/2023)  Depression (PHQ2-9): Low Risk  (11/06/2023)  Financial Resource Strain: Low Risk  (11/06/2023)  Physical Activity: Sufficiently Active (11/06/2023)  Social Connections: Moderately Isolated (12/01/2023)  Stress: Stress Concern Present (11/06/2023)  Tobacco Use: Low Risk  (12/01/2023)   SDOH Interventions:     Readmission Risk Interventions     No data to display

## 2023-12-04 NOTE — Evaluation (Signed)
Physical Therapy Evaluation Patient Details Name: Barbara Odonnell MRN: [id number] DOB: September 26, 1976 Today's Date: 12/04/2023  History of Present Illness  Patient is a 48 y/o female admitted 12/01/23 with L flank and low back pain.  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  Patient in ICU for tight BP control and slow progression of diet.  PMH positive for asthma, celiac disease, hypothyroid and anemia.  Clinical Impression  Patient presents independent with mobility and without follow up PT needs.  Time taken for education on slow progression of exercise with strict monitoring of vitals as per MD (encouraged specific guidelines for her BP/HR management prior to d/c).  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  Feel no further skilled PT needs, though pt concerned and asking multiple medical questions about her activity level that need medical guidance.  PT will sign off.         If plan is discharge home, recommend the following:     Can travel by private vehicle        Equipment Recommendations None recommended by PT  Recommendations for Other Services       Functional Status Assessment Patient has had a recent decline in their functional status and demonstrates the ability to make significant improvements in function in a reasonable and predictable amount of time.     Precautions / Restrictions Precautions Precautions: None Precaution Comments: BP<140; HR <80      Mobility  Bed Mobility Overal bed mobility: Independent                  Transfers Overall transfer level: Independent                      Ambulation/Gait Ambulation/Gait assistance: Independent Gait Distance (Feet): 300 Feet Assistive device: None Gait Pattern/deviations: WFL(Within Functional Limits)          Stairs Stairs: Yes Stairs assistance: Modified independent  (Device/Increase time) Stair Management: One rail Right, One rail Left, Alternating pattern, Forwards Number of Stairs: 4 (x 3)    Wheelchair Mobility     Tilt Bed    Modified Rankin (Stroke Patients Only)       Balance Overall balance assessment: Independent                                           Pertinent Vitals/Pain Pain Assessment Pain Assessment: No/denies pain    Home Living Family/patient expects to be discharged to:: Private residence Living Arrangements: Alone Available Help at Discharge: Friend(s);Family;Available PRN/intermittently Type of Home: Other(Comment) (condo) Home Access: Stairs to enter Entrance Stairs-Rails: Can reach both Entrance Stairs-Number of Steps: 8 to get in then 6 steps to second floor then 6 steps up to intermediate landing then 6 up to third floor   Home Layout: One level Home Equipment: None      Prior Function Prior Level of Function : Independent/Modified Independent             Mobility Comments: teaches spin, licensed therapist, coaches for employee health plans       Extremity/Trunk Assessment   Upper Extremity Assessment Upper Extremity Assessment: Overall WFL for tasks assessed    Lower Extremity Assessment Lower Extremity Assessment: Overall WFL for tasks assessed    Cervical /  Trunk Assessment Cervical / Trunk Assessment: Normal  Communication   Communication Communication: No apparent difficulties  Cognition Arousal: Alert Behavior During Therapy: WFL for tasks assessed/performed Overall Cognitive Status: Within Functional Limits for tasks assessed                                          General Comments General comments (skin integrity, edema, etc.): Education on BP/HR control and to have parameters per MD to follow.  Discussed very slow progression of activity and for monitoring vitals as per MD during exercise as pt relates she is very active teaching spin  classes and managing clients for therapy.  She verbalized understanding of all.    Exercises     Assessment/Plan    PT Assessment Patient does not need any further PT services  PT Problem List         PT Treatment Interventions      PT Goals (Current goals can be found in the Care Plan section)  Acute Rehab PT Goals PT Goal Formulation: All assessment and education complete, DC therapy    Frequency       Co-evaluation               AM-PAC PT "6 Clicks" Mobility  Outcome Measure Help needed turning from your back to your side while in a flat bed without using bedrails?: None Help needed moving from lying on your back to sitting on the side of a flat bed without using bedrails?: None Help needed moving to and from a bed to a chair (including a wheelchair)?: None Help needed standing up from a chair using your arms (e.g., wheelchair or bedside chair)?: None Help needed to walk in hospital room?: None Help needed climbing 3-5 steps with a railing? : None 6 Click Score: 24    End of Session   Activity Tolerance: Patient tolerated treatment well Patient left: in bed;with family/visitor present   PT Visit Diagnosis: Difficulty in walking, not elsewhere classified (R26.2)    Time: 5784-6962 PT Time Calculation (min) (ACUTE ONLY): 24 min   Charges:   PT Evaluation $PT Eval Low Complexity: 1 Low PT Treatments $Self Care/Home Management: 8-22 PT General Charges $$ ACUTE PT VISIT: 1 Visit         Barbara Odonnell, PT Acute Rehabilitation Services Office:[phone] 12/04/2023   Barbara Odonnell 12/04/2023, 11:31 AM

## 2023-12-04 NOTE — Progress Notes (Signed)
NAME:  Barbara Odonnell, MRN:  [id number], DOB:  1976/07/23, LOS: 3 ADMISSION DATE:  12/01/2023, CONSULTATION DATE:  12/01/23 REFERRING MD:  Dr. Silverio Lay, CHIEF COMPLAINT: SMA dissection  History of Present Illness:  Barbara Odonnell is a 48 year old female with a past medical history significant for asthma, celiac's disease, hypothyroidism, and anemia who presented to the ED for complaints of left flank and low back pain.  On ED admission she was seen severely hypertensive with BP 142/103.  Labs significant for sodium 134, chloride 94, glucose 105.  CT a abdomen and pelvis revealed dissection of the SMA with areas of mural thrombus within the false lumen.  Vascular consulted with no urgent indications for surgery, recommended tight BP control.  PCCM consulted for further management admission.  Pertinent  Medical History  Asthma, celiac's disease, hypothyroidism, and anemia   Significant Hospital Events: Including procedures, antibiotic start and stop dates in addition to other pertinent events   1/31 presented with lower abdominal/flank pain with low back pain.  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  Seen by vascular surgery.  Recommending blood pressure control, pain control, and slow progression of diet. 2/1 had episode of hypotension following fentanyl last night lactate slightly elevated 2/2 added migraine meds 2/3 improved BP   Interim History / Subjective:  Asserting that the teams are not talking Asserting that she is being discharged by vascular surgery today & all doctors have told her she is discharging Monday (today)   Wants to know why she is on a beta blocker given hx asthma    Chart review reveals episode of back/abd pain overnight, though she does not share this w me   Objective   Blood pressure 123/81, pulse 65, temperature 98.2 F (36.8 C), temperature source Oral, resp. rate (!) 22, height 5\' 7"  (1.702 m), weight 69.9 kg, SpO2 95%.        Intake/Output  Summary (Last 24 hours) at 12/04/2023 1049 Last data filed at 12/04/2023 0800 Gross per 24 hour  Intake 263.88 ml  Output 350 ml  Net -86.12 ml   Filed Weights   12/02/23 0500 12/03/23 0500 12/04/23 0458  Weight: 68.3 kg 69.9 kg 69.9 kg    Examination:  General- WDWN middle aged F  Neuo: AAOx4  HEENT - NCAT  Pulmonary: CTAb Cardiac: rrr  s1s2  Abdomen: soft  GU: defer  Extremities : no acute joint deformity   Resolved Hospital Problem list   LA   Assessment & Plan:   SMA dissection with mural thrombus HTN urgency, without known prior hx HTN P -she is on coreg and amlodipine with appropriate hemodynamics (SBP < 140, HR < 80). We discussed the utility of a beta blocker.  -she is on hep gtt. Pharmacy is going to look into DOAC options. She will be switched to DOAC + ASA before discharge  -PT is ordered.  -she will need outpt follow up with VVS, repeat CTA in 4-6wks   Hx asthma Hx GERD Migraine  Hypothyroidism  Hx celiac Hx constipation  -dulera -Pepcid  -PRNs are ordered for migraine  -synthroid -cont bowel reg   Dispo: -she remains stable to transfer out of ICU  -I have d/w VVS and we agree that plan is to continue inpatient care at this time as she is not medically stable for discharge. Extremely unclear why patient firmly feels that she is to be discharged -Both PCCM and VVS have advised against resuming her home exercise reg at the  time of discharge, and have recommended close output follow up  -She inquired multiple times about leaving AMA, which is not advised and of great risk but within her rights  Best Practice (right click and "Reselect all SmartList Selections" daily)   Diet/type: Regular consistency (see orders) DVT prophylaxis systemic heparin Pressure ulcer(s): N/A GI prophylaxis: PPI Lines: N/A Foley:  N/A Code Status:  full code Last date of multidisciplinary goals of care discussion    Moderate MDM   Tessie Fass MSN, AGACNP-BC [facility]  Pulmonary/Critical Care Medicine Amion for pager 12/04/2023, 10:49 AM

## 2023-12-04 NOTE — Telephone Encounter (Signed)
Patient Product/process development scientist completed.    The patient is insured through [facility]. Patient has [employer], may use a copay card, and/or apply for patient assistance if available.    Ran test claim for Eliquis 5 mg and the current 30 day co-pay is $30.00.   This test claim was processed through [facility]- copay amounts may vary at other pharmacies due to pharmacy/plan contracts, or as the patient moves through the different stages of their insurance plan.     Roland Earl, CPHT Pharmacy Technician III Certified Patient Advocate [facility] Patient Advocate Team Direct Number: [phone]  Fax: [phone]

## 2023-12-05 ENCOUNTER — Other Ambulatory Visit (HOSPITAL_COMMUNITY): Payer: Self-pay

## 2023-12-05 DIAGNOSIS — I7779 Dissection of other artery: Secondary | ICD-10-CM | POA: Diagnosis not present

## 2023-12-05 LAB — CBC
HCT: 42.7 % (ref 36.0–46.0)
Hemoglobin: 14.6 g/dL (ref 12.0–15.0)
MCH: 31.9 pg (ref 26.0–34.0)
MCHC: 34.2 g/dL (ref 30.0–36.0)
MCV: 93.2 fL (ref 80.0–100.0)
Platelets: 313 10*3/uL (ref 150–400)
RBC: 4.58 MIL/uL (ref 3.87–5.11)
RDW: 11.7 % (ref 11.5–15.5)
WBC: 12.8 10*3/uL — ABNORMAL HIGH (ref 4.0–10.5)
nRBC: 0 % (ref 0.0–0.2)

## 2023-12-05 LAB — GLUCOSE, CAPILLARY: Glucose-Capillary: 103 mg/dL — ABNORMAL HIGH (ref 70–99)

## 2023-12-05 LAB — HEPARIN LEVEL (UNFRACTIONATED): Heparin Unfractionated: 0.91 [IU]/mL — ABNORMAL HIGH (ref 0.30–0.70)

## 2023-12-05 MED ORDER — APIXABAN 5 MG PO TABS
5.0000 mg | ORAL_TABLET | Freq: Two times a day (BID) | ORAL | Status: DC
  Administered 2023-12-05: 5 mg via ORAL
  Filled 2023-12-05: qty 1

## 2023-12-05 MED ORDER — BISACODYL 5 MG PO TBEC
5.0000 mg | DELAYED_RELEASE_TABLET | Freq: Every day | ORAL | 0 refills | Status: DC | PRN
  Filled 2023-12-05: qty 30, 30d supply, fill #0

## 2023-12-05 MED ORDER — CYCLOBENZAPRINE HCL 5 MG PO TABS
5.0000 mg | ORAL_TABLET | Freq: Three times a day (TID) | ORAL | 0 refills | Status: DC | PRN
  Filled 2023-12-05: qty 30, 10d supply, fill #0

## 2023-12-05 MED ORDER — AMLODIPINE BESYLATE 10 MG PO TABS
10.0000 mg | ORAL_TABLET | Freq: Every day | ORAL | 1 refills | Status: DC
  Filled 2023-12-05: qty 30, 30d supply, fill #0

## 2023-12-05 MED ORDER — CARVEDILOL 25 MG PO TABS
25.0000 mg | ORAL_TABLET | Freq: Two times a day (BID) | ORAL | 1 refills | Status: DC
  Filled 2023-12-05: qty 60, 30d supply, fill #0

## 2023-12-05 MED ORDER — ASPIRIN 81 MG PO TBEC
81.0000 mg | DELAYED_RELEASE_TABLET | Freq: Every day | ORAL | 12 refills | Status: DC
  Filled 2023-12-05: qty 30, 30d supply, fill #0

## 2023-12-05 MED ORDER — HYOSCYAMINE SULFATE 0.125 MG SL SUBL
0.2500 mg | SUBLINGUAL_TABLET | SUBLINGUAL | 0 refills | Status: AC | PRN
  Filled 2023-12-05: qty 30, 3d supply, fill #0

## 2023-12-05 MED ORDER — CALCIUM CARBONATE ANTACID 500 MG PO CHEW
200.0000 mg | CHEWABLE_TABLET | Freq: Three times a day (TID) | ORAL | 1 refills | Status: DC | PRN
  Filled 2023-12-05: qty 30, 10d supply, fill #0

## 2023-12-05 MED ORDER — PANTOPRAZOLE SODIUM 40 MG PO TBEC
40.0000 mg | DELAYED_RELEASE_TABLET | Freq: Two times a day (BID) | ORAL | 0 refills | Status: DC
  Filled 2023-12-05: qty 60, 30d supply, fill #0

## 2023-12-05 MED ORDER — APIXABAN 5 MG PO TABS
5.0000 mg | ORAL_TABLET | Freq: Two times a day (BID) | ORAL | 1 refills | Status: DC
  Filled 2023-12-05: qty 60, 30d supply, fill #0

## 2023-12-05 NOTE — Progress Notes (Signed)
 PHARMACY - ANTICOAGULATION CONSULT NOTE  Pharmacy Consult for heparin  Indication:  mural thrombus in setting of SMA dissection  Labs: Recent Labs    12/03/23 0215 12/04/23 0328 12/05/23 0354  HGB 15.2* 15.4* 14.6  HCT 44.4 44.3 42.7  PLT 271 313 313  HEPARINUNFRC 0.57 0.55 0.91*  CREATININE 1.11*  --   --    Assessment: 48yo female supratherapeutic on heparin  after several levels near goal; no infusion issues or signs of bleeding per RN.  Goal of Therapy:  Heparin  level 0.3-0.5 units/ml   Plan:  Decrease heparin  infusion by slightly to 1100 units/hr. Check level if plan to switch to DOAC is delayed.   Marvetta Dauphin, PharmD, BCPS 12/05/2023 6:10 AM

## 2023-12-05 NOTE — Progress Notes (Addendum)
 [facility]  Subjective  - Anxious and high strung    Objective 103/80 81 98.2 F (36.8 C) (Oral) 18 95%  Intake/Output Summary (Last 24 hours) at 12/05/2023 0729 Last data filed at 12/04/2023 1100 Gross per 24 hour  Intake 47.91 ml  Output --  Net 47.91 ml    Lungs non labored breathing Moving all extremities Abdomin non distended, soft  Assessment/Planning: 48 y.o. female is here with a spontaneous SMA dissection.   no urgent indications for surgery  Conservative treatment plan.  BP control, repeat CTA Abd/Pelvis 4-6 weeks f/u with Sheree She continues on heparin  drip and will plan to convert to DOAC and aspirin  prior to discharge.   Decreased in Leukocytosis She states she will be leaving today even if the MD is not ready to discharge her.   VVS F/U sent to our office Stable for discharge once on DOAC of choice prior to discharge.   Maurilio Deland Collet 12/05/2023 7:29 AM --  Laboratory Lab Results: Recent Labs    12/04/23 0328 12/05/23 0354  WBC 14.5* 12.8*  HGB 15.4* 14.6  HCT 44.3 42.7  PLT 313 313   BMET Recent Labs    12/03/23 0215  NA 133*  K 4.4  CL 97*  CO2 25  GLUCOSE 105*  BUN 17  CREATININE 1.11*  CALCIUM  9.3    COAG Lab Results  Component Value Date   INR 1.0 12/02/2023   No results found for: PTT

## 2023-12-05 NOTE — Discharge Summary (Signed)
 Physician Discharge Summary  Patient ID: Barbara Odonnell MRN: [id number] DOB/AGE: September 05, 1976 48 y.o.  Admit date: 12/01/2023 Discharge date: 12/05/2023  Admission Diagnoses:  Discharge Diagnoses:  Principal Problem:   Dissection of mesenteric artery [facility]) Active Problems:   Hypertensive emergency   Discharged Condition: stable  Hospital Course:  Patient is a 48 year old female, with a past medical history significant for asthma, celiac's disease, hypothyroidism, and anemia who presented to the ED for complaints of left flank and low back pain. On ED admission she was seen severely hypertensive.  CT of the abdomen and pelvis revealed dissection of the SMA with areas of mural thrombus within the false lumen. Vascular surgery team was consulted with no urgent indications for surgery, recommended tight BP control.  Patient was admitted and managed by the pulmonary and critical care team.  [facility] hospitalist team only assumed care on the day of discharge.  Patient will follow-up with primary care provider, vascular surgery team, pulmonary and critical care team and rheumatology team on discharge.  Patient was managed conservatively.  Patient will be discharged on apixaban  and aspirin .   SMA dissection with mural thrombus HTN urgency, without known prior hx HTN -Patient will need outpt follow up with VVS, repeat CTA of the abdomen in 4-6wks    Hx asthma Hx GERD Migraine  Hypothyroidism  Hx celiac Hx constipation       Consults:  vascular surgery Pulmonary and critical care team  Significant Diagnostic Studies:   Treatments:   Discharge Exam: Blood pressure 112/68, pulse 67, temperature 98.2 F (36.8 C), temperature source Oral, resp. rate 20, height 5' 7 (1.702 m), weight 68.6 kg, SpO2 94%.   Disposition: Discharge disposition: 01-Home or Self Care       Discharge Instructions     Diet - low sodium heart healthy   Complete by: As directed    Increase activity slowly    Complete by: As directed       Allergies as of 12/05/2023       Reactions   Sulfa Antibiotics Anaphylaxis   Gluten Meal    Celiac Disease   Montelukast  Other (See Comments)   Angry, irritable, out of skin feeling   Other Hives   Dark alcohol   Singulair  [montelukast  Sodium]    Angry, irritable, out of skin feeling        Medication List     STOP taking these medications    amoxicillin -clavulanate 875-125 MG tablet Commonly known as: AUGMENTIN    ascorbic acid 500 MG tablet Commonly known as: VITAMIN C   etonogestrel -ethinyl estradiol  0.12-0.015 MG/24HR vaginal ring Commonly known as: NUVARING   famotidine  20 MG tablet Commonly known as: PEPCID    Ferrex 28 Misc   MAGNESIUM GLUCONATE PO   naratriptan  2.5 MG tablet Commonly known as: AMERGE   RIBOFLAVIN PO   rizatriptan  10 MG tablet Commonly known as: MAXALT        TAKE these medications    Ajovy  225 MG/1.5ML Soaj Generic drug: Fremanezumab -vfrm Inject 225 mg into the skin every 30 (thirty) days. Please run copay card: BIN [id number] PCN [id number] GRP [id number] ID [id number]   amLODipine  10 MG tablet Commonly known as: NORVASC  Take 1 tablet (10 mg total) by mouth daily. Start taking on: December 06, 2023   apixaban  5 MG Tabs tablet Commonly known as: ELIQUIS  Take 1 tablet (5 mg total) by mouth 2 (two) times daily.   aspirin  EC 81 MG tablet Take 1 tablet (81 mg total) by mouth daily. Swallow  whole. Start taking on: December 06, 2023   bisacodyl  5 MG EC tablet Commonly known as: DULCOLAX Take 1 tablet (5 mg total) by mouth daily as needed for moderate constipation or mild constipation.   budesonide -formoterol  160-4.5 MCG/ACT inhaler Commonly known as: Symbicort  Inhale 2 puffs into the lungs 2 (two) times daily. in the morning and at bedtime.   buPROPion  150 MG 24 hr tablet Commonly known as: WELLBUTRIN  XL TAKE 1 TABLET BY MOUTH EVERY DAY   calcium  carbonate 500 MG chewable tablet Commonly known as: TUMS  - dosed in mg elemental calcium  Chew 1 tablet (200 mg of elemental calcium  total) by mouth 3 (three) times daily as needed for indigestion or heartburn.   carvedilol  25 MG tablet Commonly known as: COREG  Take 1 tablet (25 mg total) by mouth 2 (two) times daily with a meal.   cyclobenzaprine  5 MG tablet Commonly known as: FLEXERIL  Take 1 tablet (5 mg total) by mouth 3 (three) times daily as needed for muscle spasms (migraine). What changed: See the new instructions.   diclofenac  1.3 % Ptch Commonly known as: Flector  Place 1 patch onto the skin 2 (two) times daily.   hyoscyamine  0.125 MG SL tablet Commonly known as: LEVSIN  SL Take 2 tablets (0.25 mg total) by mouth every 4 (four) hours as needed (bladder spasm).   ondansetron  4 MG disintegrating tablet Commonly known as: ZOFRAN -ODT DISSOLVE 1 TABLET ON THE TONGUE EVERY 8 HOURS AS NEEDED FOR NAUSEA AND VOMITING   oxyCODONE -acetaminophen  5-325 MG tablet Commonly known as: PERCOCET/ROXICET Take 1 tablet by mouth every 6 (six) hours as needed for up to 5 days.   pantoprazole  40 MG tablet Commonly known as: PROTONIX  Take 1 tablet (40 mg total) by mouth 2 (two) times daily.   PROAIR  HFA IN Inhale 1 puff into the lungs daily as needed.   Synthroid  50 MCG tablet Generic drug: levothyroxine  Take 1 tablet (50 mcg total) by mouth daily.   triamcinolone cream 0.1 % Commonly known as: KENALOG Apply 1 Application topically.   valACYclovir  500 MG tablet Commonly known as: VALTREX  TAKE 1 TABLET BY MOUTH EVERY DAY **MYLAN BRAND**        Follow-up Information     Kriss Estefana DEL, DO .   Specialty: Gastroenterology Contact information: [address] [place] [address] [phone]         Bulah Alm RAMAN, PA-C Follow up.   Specialty: Family Medicine Why: appt schedueled for 12/08/2023 at 8 am Contact information: [address] [place] [address] [phone]         Sheree Penne Bruckner, MD Follow up in 4 week(s).   Specialties: Vascular Surgery, Cardiology Why: Office will call you to arrange your appt (sent) Contact information: [address] [place] [address] [phone]                 Time spent: 35 minutes.  SignedBETHA Leatrice LILLETTE Rosario 12/05/2023, 2:28 PM

## 2023-12-05 NOTE — Progress Notes (Signed)
 PHARMACY - ANTICOAGULATION CONSULT NOTE  Pharmacy Consult for heparin  > Eliquis  Indication: SMA dissection /  thrombus  Allergies  Allergen Reactions   Sulfa Antibiotics Anaphylaxis   Gluten Meal     Celiac Disease   Montelukast  Other (See Comments)    Angry, irritable, out of skin feeling   Other Hives    Dark alcohol   Singulair  [Montelukast  Sodium]     Angry, irritable, out of skin feeling    Patient Measurements: Height: 5' 7 (170.2 cm) Weight: 68.6 kg (151 lb 3.2 oz) IBW/kg (Calculated) : 61.6 Heparin  Dosing Weight: 69kg  Vital Signs: Temp: 98.2 F (36.8 C) (02/04 0745) Temp Source: Oral (02/04 0745) BP: 112/68 (02/04 0745) Pulse Rate: 67 (02/04 0745)  Labs: Recent Labs    12/03/23 0215 12/04/23 0328 12/05/23 0354  HGB 15.2* 15.4* 14.6  HCT 44.4 44.3 42.7  PLT 271 313 313  HEPARINUNFRC 0.57 0.55 0.91*  CREATININE 1.11*  --   --     Estimated Creatinine Clearance: 59 mL/min (A) (by C-G formula based on SCr of 1.11 mg/dL (H)).   Medical History: Past Medical History:  Diagnosis Date   Allergy     Anemia    IV iron in past, prior to 2018   Asthma    Celiac disease    +prior biopsy   GERD (gastroesophageal reflux disease)    H/O bone density study 12/21/2017   normal   Hip fracture (HCC) 2008   right, Dr. Harvey   History of stress test 2014   [facility]   Hypothyroidism    Migraine headache without aura    gets bad nausea.  does well on Naratriptan  and Relpax.  Failed Imitrex , Maxalt    Stress fracture    severeral prior   Transfusion history    has had IV iron prior, Dr. Timmy prior management    Assessment: 22 YOF presenting with abdominal pain, CT with mesenteric dissection and mural thrombus, she is not on anticoagulation PTA, pharmacy consulted to start heparin   Heparin  drip 1200 uts/hr with heparin  level 0.55 at goal Cbc stable no bleeding noted   Goal of Therapy:  Heparin  level ~0.3-0.5 units/ml Monitor platelets by  anticoagulation protocol: Yes   Plan:  STOP heparin  infusion START Eliquis  5 mg twice daily New start - can use 30d free card then copay card ($10/mo) at discharge  Maurilio Fila, PharmD Clinical Pharmacist 12/05/2023  10:28 AM

## 2023-12-05 NOTE — Progress Notes (Signed)
 DISCHARGE NOTE HOME Barbara Odonnell to be discharged Home per MD order. Discussed prescriptions and follow up appointments with the patient. Prescriptions given to patient; medication list explained in detail. Patient verbalized understanding.  Skin clean, dry and intact without evidence of skin break down, no evidence of skin tears noted. IV catheter discontinued intact. Site without signs and symptoms of complications. Dressing and pressure applied. Pt denies pain at the site currently. No complaints noted.  Patient free of lines, drains, and wounds.   An After Visit Summary (AVS) was printed and given to the patient. Patient taken to discharg lounge to wait for TOC meds and use cab voucher Peyton SHAUNNA Pepper, RN

## 2023-12-06 ENCOUNTER — Encounter (HOSPITAL_BASED_OUTPATIENT_CLINIC_OR_DEPARTMENT_OTHER): Payer: Self-pay | Admitting: Emergency Medicine

## 2023-12-06 ENCOUNTER — Emergency Department (HOSPITAL_BASED_OUTPATIENT_CLINIC_OR_DEPARTMENT_OTHER): Payer: BC Managed Care – PPO

## 2023-12-06 ENCOUNTER — Telehealth: Payer: Self-pay

## 2023-12-06 ENCOUNTER — Emergency Department (HOSPITAL_BASED_OUTPATIENT_CLINIC_OR_DEPARTMENT_OTHER)
Admission: EM | Admit: 2023-12-06 | Discharge: 2023-12-06 | Disposition: A | Discharge status: Home / Self Care | Payer: BC Managed Care – PPO | Attending: Emergency Medicine | Admitting: Emergency Medicine

## 2023-12-06 DIAGNOSIS — Z7901 Long term (current) use of anticoagulants: Secondary | ICD-10-CM | POA: Insufficient documentation

## 2023-12-06 DIAGNOSIS — W228XXA Striking against or struck by other objects, initial encounter: Secondary | ICD-10-CM | POA: Insufficient documentation

## 2023-12-06 DIAGNOSIS — Z7982 Long term (current) use of aspirin: Secondary | ICD-10-CM | POA: Insufficient documentation

## 2023-12-06 DIAGNOSIS — S0990XA Unspecified injury of head, initial encounter: Secondary | ICD-10-CM | POA: Insufficient documentation

## 2023-12-06 NOTE — Transitions of Care (Post Inpatient/ED Visit) (Signed)
 12/06/2023  Name: Barbara Odonnell MRN: [id number] DOB: 1976/08/05  Today's TOC FU Call Status: Today's TOC FU Call Status:: Successful TOC FU Call Completed TOC FU Call Complete Date: 12/06/23 Patient's Name and Date of Birth confirmed.  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 How have you been since you were released from the hospital?: Better Any questions or concerns?: No  Items Reviewed: Did you receive and understand the discharge instructions provided?: Yes Medications obtained,verified, and reconciled?: Yes (Medications Reviewed) Any new allergies since your discharge?: No Dietary orders reviewed?: NA Do you have support at home?: Yes People in Home: alone, parent(s) Name of Support/Comfort Primary Source: [place]  Medications Reviewed Today: Medications Reviewed Today     Reviewed by Moises Reusing, RN (Case Manager) on 12/06/23 at 1320  Med List Status: <None>   Medication Order Taking? Sig Documenting Provider Last Dose Status Informant  Albuterol  Sulfate (PROAIR  HFA IN) [id number] No Inhale 1 puff into the lungs daily as needed. [provider] Past Week Active Self, Pharmacy Records  amLODipine  (NORVASC ) 10 MG tablet [id number]  Take 1 tablet (10 mg total) by mouth daily. Rosario Leatrice FERNS, MD  Active   apixaban  (ELIQUIS ) 5 MG TABS tablet [id number]  Take 1 tablet (5 mg total) by mouth 2 (two) times daily. Rosario Leatrice I, MD  Active   aspirin  EC 81 MG tablet [id number]  Take 1 tablet (81 mg total) by mouth daily. Swallow whole. Rosario Leatrice FERNS, MD  Active   bisacodyl  (DULCOLAX) 5 MG EC tablet [id number]  Take 1 tablet (5 mg total) by mouth daily as needed for moderate constipation or mild constipation. Rosario Leatrice FERNS, MD  Active   budesonide -formoterol   (SYMBICORT ) 160-4.5 MCG/ACT inhaler [id number] No Inhale 2 puffs into the lungs 2 (two) times daily. in the morning and at bedtime. Tysinger, Alm RAMAN, PA-C Past Week Active Self, Pharmacy Records  buPROPion  (WELLBUTRIN  XL) 150 MG 24 hr tablet [id number] No TAKE 1 TABLET BY MOUTH EVERY DAY Tysinger, David S, PA-C 12/01/2023 Active Self, Pharmacy Records  calcium  carbonate (TUMS - DOSED IN MG ELEMENTAL CALCIUM ) 500 MG chewable tablet [id number]  Chew 1 tablet (200 mg of elemental calcium  total) by mouth 3 (three) times daily as needed for indigestion or heartburn. Rosario Leatrice FERNS, MD  Active   carvedilol  (COREG ) 25 MG tablet [id number]  Take 1 tablet (25 mg total) by mouth 2 (two) times daily with a meal. Rosario Leatrice FERNS, MD  Active   cyclobenzaprine  (FLEXERIL ) 5 MG tablet [id number]  Take 1 tablet (5 mg total) by mouth 3 (three) times daily as needed for muscle spasms (migraine). Rosario Leatrice I, MD  Active   diclofenac  (FLECTOR ) 1.3 % PTCH [id number] No Place 1 patch onto the skin 2 (two) times daily. Cleatrice Ludie SAUNDERS, MD 12/01/2023 Active Self, Pharmacy Records  Fremanezumab -vfrm (AJOVY ) 225 MG/1.5ML SOAJ [id number] No Inject 225 mg into the skin every 30 (thirty) days. Please run copay card: BIN [id number] PCN [id number] GRP [id number] ID [id number] [name], MD Taking Active Self, Pharmacy Records  hyoscyamine  (LEVSIN  SL) 0.125 MG SL tablet [id number]  Take 2 tablets (0.25 mg total) by mouth every 4 (four) hours as needed (bladder spasm). Rosario Leatrice FERNS, MD  Active   ondansetron  (ZOFRAN -ODT) 4 MG disintegrating tablet [id number] No DISSOLVE 1 TABLET ON THE TONGUE EVERY 8 HOURS AS NEEDED  FOR NAUSEA AND VOMITING Tysinger, Alm RAMAN, PA-C Taking Active Self, Pharmacy Records  pantoprazole  (PROTONIX ) 40 MG tablet [id number]  Take 1 tablet (40 mg total) by mouth 2 (two) times daily. Rosario Eland I, MD  Active   SYNTHROID  50 MCG tablet [id number] No Take 1 tablet (50 mcg total) by mouth daily. Bulah Alm RAMAN, PA-C 12/01/2023 Active Self, Pharmacy Records  triamcinolone (KENALOG) 0.1 % [id number] No Apply 1 Application topically. [provider] Past Week Active Self, Pharmacy Records  valACYclovir  (VALTREX ) 500 MG tablet [id number] No TAKE 1 TABLET BY MOUTH EVERY DAY **MYLAN BRAND**  Patient taking differently: Take 500 mg by mouth daily as needed.  **MYLAN BRAND**   Tysinger, Alm RAMAN, PA-C Past Month Active Self, Pharmacy Records            Home Care and Equipment/Supplies: Were Home Health Services Ordered?: NA Any new equipment or medical supplies ordered?: NA  Functional Questionnaire: Do you need assistance with bathing/showering or dressing?: No Do you need assistance with meal preparation?: No Do you need assistance with eating?: No Do you have difficulty maintaining continence: No Do you need assistance with getting out of bed/getting out of a chair/moving?: No Do you have difficulty managing or taking your medications?: No  Follow up appointments reviewed: PCP Follow-up appointment confirmed?: Yes Date of PCP follow-up appointment?: 12/08/23 Follow-up Provider: [facility] Follow-up appointment confirmed?: Yes Date of Specialist follow-up appointment?: 01/10/24 Follow-Up Specialty Provider:: Penne [place] Do you need transportation to your follow-up appointment?: No Do you understand care options if your condition(s) worsen?: Yes-patient verbalized understanding  SDOH Interventions Today    Flowsheet Row Most Recent Value  SDOH Interventions   Food Insecurity Interventions Intervention Not Indicated  Housing Interventions Intervention Not Indicated  Transportation Interventions Intervention Not Indicated  Utilities Interventions Intervention Not Indicated  Social Connections Interventions Intervention Not Indicated      Interventions Today    Flowsheet Row Most Recent Value  Chronic Disease   Chronic disease during today's  visit Other  [None. Dissection of Mesenteric Artery]  General Interventions   General Interventions Discussed/Reviewed General Interventions Discussed  Exercise Interventions   Exercise Discussed/Reviewed Exercise Discussed, Exercise Reviewed  Education Interventions   Education Provided Provided Education  Provided Verbal Education On [employer], Exercise, Medication  [Reviewed medication coverage]  Nutrition Interventions   Nutrition Discussed/Reviewed Nutrition Discussed  Pharmacy Interventions   Pharmacy Dicussed/Reviewed Medications and their functions       TOC Outreach today to the patient. She has no needs. Her goal is to keep her blood pressure low. 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 She lives alone but states she has a wide range of friends to support her. Reviewed new medications. She has requested a GI referral from Dr. Bulah and does not want to follow up with the provider the Hospital recommended. Declines 30 day enrollment.   Medford Balboa, BSN, RN [place]  [facility] - [employer] [facility] RN Care Manager [phone]

## 2023-12-06 NOTE — ED Provider Notes (Signed)
 [place] EMERGENCY DEPARTMENT AT [facility] Provider Note   CSN: [id number] Arrival date & time: 12/06/23  2145     History  Chief Complaint  Patient presents with   Head Injury    Barbara Odonnell is a 48 y.o. female on anticoagulation, Eliquis , presented to ED with a low impact injury, straining up and striking her head on and off on a cabinet.  She does not report any significant headache or dizziness.  She is here because she was told to come in by nursing hotline to rule out brain bleed.  HPI     Home Medications Prior to Admission medications   Medication Sig Start Date End Date Taking? Authorizing Provider  Albuterol  Sulfate (PROAIR  HFA IN) Inhale 1 puff into the lungs daily as needed.    [provider]  amLODipine  (NORVASC ) 10 MG tablet Take 1 tablet (10 mg total) by mouth daily. 12/06/23   Rosario Leatrice FERNS, MD  apixaban  (ELIQUIS ) 5 MG TABS tablet Take 1 tablet (5 mg total) by mouth 2 (two) times daily. 12/05/23   Rosario Leatrice FERNS, MD  aspirin  EC 81 MG tablet Take 1 tablet (81 mg total) by mouth daily. Swallow whole. 12/06/23   Rosario Leatrice FERNS, MD  bisacodyl  (DULCOLAX) 5 MG EC tablet Take 1 tablet (5 mg total) by mouth daily as needed for moderate constipation or mild constipation. 12/05/23   Rosario Leatrice FERNS, MD  budesonide -formoterol  (SYMBICORT ) 160-4.5 MCG/ACT inhaler Inhale 2 puffs into the lungs 2 (two) times daily. in the morning and at bedtime. 11/06/23   Tysinger, Alm RAMAN, PA-C  buPROPion  (WELLBUTRIN  XL) 150 MG 24 hr tablet TAKE 1 TABLET BY MOUTH EVERY DAY 10/30/23   Tysinger, Alm RAMAN, PA-C  calcium  carbonate (TUMS - DOSED IN MG ELEMENTAL CALCIUM ) 500 MG chewable tablet Chew 1 tablet (200 mg of elemental calcium  total) by mouth 3 (three) times daily as needed for indigestion or heartburn. 12/05/23   Rosario Leatrice FERNS, MD  carvedilol  (COREG ) 25 MG tablet Take 1 tablet (25 mg total) by mouth 2 (two) times daily with a meal. 12/05/23   Rosario Leatrice FERNS, MD  cyclobenzaprine  (FLEXERIL ) 5 MG tablet Take 1 tablet (5 mg total) by mouth 3 (three) times daily as needed for muscle spasms (migraine). 12/05/23   Rosario Leatrice FERNS, MD  diclofenac  (FLECTOR ) 1.3 % PTCH Place 1 patch onto the skin 2 (two) times daily. 09/19/23   [place], Ludie SAUNDERS, MD  Fremanezumab -vfrm (AJOVY ) 225 MG/1.5ML SOAJ Inject 225 mg into the skin every 30 (thirty) days. Please run copay card: BIN [id number] PCN [id number] GRP [id number] ID [id number] 08/14/23   [name], MD  hyoscyamine  (LEVSIN  SL) 0.125 MG SL tablet Take 2 tablets (0.25 mg total) by mouth every 4 (four) hours as needed (bladder spasm). 12/05/23   Rosario Leatrice I, MD  ondansetron  (ZOFRAN -ODT) 4 MG disintegrating tablet DISSOLVE 1 TABLET ON THE TONGUE EVERY 8 HOURS AS NEEDED FOR NAUSEA AND VOMITING 07/20/23   Tysinger, Alm RAMAN, PA-C  pantoprazole  (PROTONIX ) 40 MG tablet Take 1 tablet (40 mg total) by mouth 2 (two) times daily. 12/05/23   Rosario Leatrice FERNS, MD  SYNTHROID  50 MCG tablet Take 1 tablet (50 mcg total) by mouth daily. 11/06/23   Tysinger, Alm RAMAN, PA-C  triamcinolone (KENALOG) 0.1 % Apply 1 Application topically. 01/17/21   [provider]  valACYclovir  (VALTREX ) 500 MG tablet TAKE 1 TABLET BY MOUTH EVERY DAY **MYLAN BRAND** Patient taking differently: Take 500 mg  by mouth daily as needed.  **MYLAN BRAND** 12/01/23   Tysinger, Alm RAMAN, PA-C      Allergies    Sulfa antibiotics, Fentanyl , Gluten meal, Montelukast , Other, and Singulair  [montelukast  sodium]    Review of Systems   Review of Systems  Physical Exam Updated Vital Signs BP 109/79 (BP Location: Right Arm)   Pulse 71   Temp 98 F (36.7 C)   Resp 15   LMP  (Approximate)   SpO2 100%  Physical Exam Constitutional:      General: She is not in acute distress. HENT:     Head: Normocephalic and atraumatic.  Eyes:     Conjunctiva/sclera: Conjunctivae normal.     Pupils: Pupils are equal, round, and reactive to light.   Cardiovascular:     Rate and Rhythm: Normal rate and regular rhythm.  Pulmonary:     Effort: Pulmonary effort is normal. No respiratory distress.  Skin:    General: Skin is warm and dry.  Neurological:     General: No focal deficit present.     Mental Status: She is alert. Mental status is at baseline.  Psychiatric:        Mood and Affect: Mood normal.        Behavior: Behavior normal.     ED Results / Procedures / Treatments   Labs (all labs ordered are listed, but only abnormal results are displayed) Labs Reviewed - No data to display  EKG None  Radiology CT Head Wo Contrast Result Date: 12/06/2023 CLINICAL DATA:  Trauma EXAM: CT HEAD WITHOUT CONTRAST TECHNIQUE: Contiguous axial images were obtained from the base of the skull through the vertex without intravenous contrast. RADIATION DOSE REDUCTION: This exam was performed according to the departmental dose-optimization program which includes automated exposure control, adjustment of the mA and/or kV according to patient size and/or use of iterative reconstruction technique. COMPARISON:  None Available. FINDINGS: Brain: No evidence of acute infarction, hemorrhage, hydrocephalus, extra-axial collection or mass lesion/mass effect. Vascular: No hyperdense vessel or unexpected calcification. Skull: Normal. Negative for fracture or focal lesion. Sinuses/Orbits: No acute finding. Other: None. IMPRESSION: No acute intracranial abnormality. Electronically Signed   By: Greig Pique M.D.   On: 12/06/2023 22:32    Procedures Procedures    Medications Ordered in ED Medications - No data to display  ED Course/ Medical Decision Making/ A&P                                 Medical Decision Making Amount and/or Complexity of Data Reviewed Radiology: ordered.   This is a well-appearing patient presenting with an isolated injury of the head, relatively low impact.  CT imaging of head was ordered in triage personally read interpreted with  no emergent findings, no ICH.  She is neurologically intact.  Well-appearing otherwise.  No other traumatic injuries.  She is stable for discharge.        Final Clinical Impression(s) / ED Diagnoses Final diagnoses:  Injury of head, initial encounter    Rx / DC Orders ED Discharge Orders     None         Mehtaab Mayeda, Donnice PARAS, MD 12/06/23 2246

## 2023-12-06 NOTE — ED Triage Notes (Signed)
Hit head on cabinet  Happened around 9PM No LOC On eliquis

## 2023-12-06 NOTE — ED Notes (Signed)
 Reviewed AVS/discharge instruction with patient. Time allotted for and all questions answered. Patient is agreeable for d/c and escorted to ed exit by staff.

## 2023-12-08 ENCOUNTER — Ambulatory Visit: Payer: BC Managed Care – PPO | Admitting: Medical

## 2023-12-11 ENCOUNTER — Other Ambulatory Visit: Payer: Self-pay | Admitting: Medical

## 2023-12-11 MED ORDER — CARVEDILOL 6.25 MG PO TABS: 6.2500 mg | ORAL_TABLET | Freq: Two times a day (BID) | ORAL | 0 refills | Status: DC

## 2023-12-13 ENCOUNTER — Ambulatory Visit: Payer: BC Managed Care – PPO | Admitting: Medical

## 2023-12-13 ENCOUNTER — Other Ambulatory Visit: Payer: Self-pay

## 2023-12-13 VITALS — BP 108/74 | HR 89 | Wt 156.6 lb

## 2023-12-13 DIAGNOSIS — I7779 Dissection of other artery: Secondary | ICD-10-CM

## 2023-12-13 DIAGNOSIS — E039 Hypothyroidism, unspecified: Secondary | ICD-10-CM | POA: Diagnosis not present

## 2023-12-13 DIAGNOSIS — J4540 Moderate persistent asthma, uncomplicated: Secondary | ICD-10-CM

## 2023-12-13 DIAGNOSIS — I7300 Raynaud's syndrome without gangrene: Secondary | ICD-10-CM

## 2023-12-13 DIAGNOSIS — Z8249 Family history of ischemic heart disease and other diseases of the circulatory system: Secondary | ICD-10-CM

## 2023-12-13 DIAGNOSIS — Z136 Encounter for screening for cardiovascular disorders: Secondary | ICD-10-CM

## 2023-12-13 DIAGNOSIS — Z1211 Encounter for screening for malignant neoplasm of colon: Secondary | ICD-10-CM | POA: Diagnosis not present

## 2023-12-13 DIAGNOSIS — G43709 Chronic migraine without aura, not intractable, without status migrainosus: Secondary | ICD-10-CM

## 2023-12-13 DIAGNOSIS — K900 Celiac disease: Secondary | ICD-10-CM | POA: Diagnosis not present

## 2023-12-13 NOTE — Progress Notes (Signed)
Subjective:  Barbara Odonnell is a 48 y.o. female who presents for Chief Complaint  Patient presents with   Hospitalization Follow-up    Hospital follow-up. Blood pressure      Here for hospital follow-up.  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  She does have a history of hypothyroidism, migraines, anemia, celiac disease, asthma, but no prior hypertensive disease.  She does note a family history of premature heart disease in both paternal grandparents, and grandmother had a bulging aorta that was monitored.  Barbara Odonnell's mother is very fit but on 2 BP medications.    She also notes her 2 brothers have history of gout.  There has been some concern in the past about Barbara Odonnell having connective tissue disease.  But no specific diagnosis.  No prior rheumatoid evaluation  Regarding her hospitalization,she went in for abdominal pain that was severe in the left lower quadrant.  I had seen her virtually on January 30 2025th and she had a CT scan of her abdomen the day before 3 urology.  Only got results back the next day there was no obvious diverticulitis or other obvious finding.  She ended up going to the emergency department on December 01, 2023 and was admitted after a different scan showed possible SMA dissection.  She was admitted to the ICU.  Her blood pressure was quite elevated.  She was finally discharged after follow-up exam and consults.  She was discharged on blood pressure medicines.  The goal was to keep the blood pressure stable since surgical intervention was not ultimately advised.  Since discharge she had been getting quite low BP readings and has been communicating with me through MyChart message.  As of December 08, 2023 she was getting blood pressure readings as low as 80/70, less than 100 systolic blood pressure irregular.  We readjusted her  medicines earlier this week through our communication on MyChart.  I had her lower her dose of carvedilol from 25 twice a day to 6.25 mg twice a day starting 2 days ago 12/11/2023.  We continued amlodipine at 10 mg daily.  Even in the last 2 days she has had some blood pressure readings such as 130/90, 129/90.  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  She tried to do a plank the other day and her blood pressure rose up to 140 so she has been abstaining from all exercises lately.  Most of her blood pressure readings in the last 24 hours range between 100/70 up to 124/88  She continues on Eliquis 5 mg twice daily, Aspirin 81mg  daily.  She has vascular surgery follow-up outpatient on 01/10/2024.  No other aggravating or relieving factors.    No other c/o.  Past Medical History:  Diagnosis Date   Allergy    Anemia    IV iron in past, prior to 2018   Asthma    Celiac disease    +prior biopsy   GERD (gastroesophageal reflux disease)    H/O bone density study 12/21/2017   normal   Hip fracture (HCC) 2008   right, Dr. Darrick Penna   History of stress test 2014   [facility]   Hypothyroidism    Migraine headache without aura    gets bad  nausea.  does well on Naratriptan and Relpax.  Failed Imitrex, Maxalt   Stress fracture    severeral prior   Transfusion history    has had IV iron prior, Dr. Myna Hidalgo prior management   Current Outpatient Medications on File Prior to Visit  Medication Sig Dispense Refill   amLODipine (NORVASC) 10 MG tablet Take 1 tablet (10 mg total) by mouth daily. 30 tablet 1   apixaban (ELIQUIS) 5 MG TABS tablet Take 1 tablet (5 mg total) by mouth 2 (two) times daily. 60 tablet 1   aspirin EC 81 MG tablet Take 1 tablet (81 mg total) by mouth daily. Swallow whole. 30 tablet 12   bisacodyl (DULCOLAX) 5 MG EC tablet Take 1 tablet (5 mg total) by mouth daily as needed for  moderate constipation or mild constipation. 30 tablet 0   budesonide-formoterol (SYMBICORT) 160-4.5 MCG/ACT inhaler Inhale 2 puffs into the lungs 2 (two) times daily. in the morning and at bedtime. 10.2 each 11   buPROPion (WELLBUTRIN XL) 150 MG 24 hr tablet TAKE 1 TABLET BY MOUTH EVERY DAY 90 tablet 1   calcium carbonate (TUMS - DOSED IN MG ELEMENTAL CALCIUM) 500 MG chewable tablet Chew 1 tablet (200 mg of elemental calcium total) by mouth 3 (three) times daily as needed for indigestion or heartburn. 30 tablet 1   carvedilol (COREG) 6.25 MG tablet Take 1 tablet (6.25 mg total) by mouth 2 (two) times daily. 60 tablet 0   cyclobenzaprine (FLEXERIL) 5 MG tablet Take 1 tablet (5 mg total) by mouth 3 (three) times daily as needed for muscle spasms (migraine). 30 tablet 0   diclofenac (FLECTOR) 1.3 % PTCH Place 1 patch onto the skin 2 (two) times daily. 60 patch 1   Fremanezumab-vfrm (AJOVY) 225 MG/1.5ML SOAJ Inject 225 mg into the skin every 30 (thirty) days. Please run copay card: BIN [id number] PCN PDMI GRP [id number] ID [id number] 1.5 mL 11   ondansetron (ZOFRAN-ODT) 4 MG disintegrating tablet DISSOLVE 1 TABLET ON THE TONGUE EVERY 8 HOURS AS NEEDED FOR NAUSEA AND VOMITING 90 tablet 0   SYNTHROID 50 MCG tablet Take 1 tablet (50 mcg total) by mouth daily. 90 tablet 1   valACYclovir (VALTREX) 500 MG tablet TAKE 1 TABLET BY MOUTH EVERY DAY **MYLAN BRAND** (Patient taking differently: Take 500 mg by mouth daily as needed.  **MYLAN BRAND**) 90 tablet 0   Albuterol Sulfate (PROAIR HFA IN) Inhale 1 puff into the lungs daily as needed.     hyoscyamine (LEVSIN SL) 0.125 MG SL tablet Take 2 tablets (0.25 mg total) by mouth every 4 (four) hours as needed (bladder spasm). 30 tablet 0   triamcinolone (KENALOG) 0.1 % Apply 1 Application topically.     No current facility-administered medications on file prior to visit.    The following portions of the patient's history were reviewed and updated as appropriate:  allergies, current medications, past family history, past medical history, past social history, past surgical history and problem list.  ROS Otherwise as in subjective above    Objective: BP 108/74   Pulse 89   Wt 156 lb 9.6 oz (71 kg)   BMI 24.53 kg/m   Wt Readings from Last 3 Encounters:  12/13/23 156 lb 9.6 oz (71 kg)  12/05/23 151 lb 3.2 oz (68.6 kg)  11/30/23 149 lb (67.6 kg)   BP Readings from Last 3 Encounters:  12/13/23 108/74  12/06/23 109/79  12/05/23 112/68    General appearance: alert, no distress, well developed,  well nourished She has some bruising in both antecubital areas as well as right anterior forearm laterally and left lateral anterior forearm, no obvious inflamed veins or obvious phlebitis of either forearm    Assessment: Encounter Diagnoses  Name Primary?   Dissection of mesenteric artery (HCC) Yes   Moderate persistent asthma, unspecified whether complicated    Hypothyroidism, unspecified type    Family history of premature CAD    Chronic migraine without aura without status migrainosus, not intractable    Raynaud's phenomenon without gangrene    Screening for heart disease      Plan: This very fit, active clean female with no prior hypertension history presents to the hospital recently with spontaneous SMA dissection and clot.  I reviewed her recent hospitalization records, discharge summary which is not complete in the chart record, recent imaging and labs.  Medicines reconciled  At this point regarding tight blood pressure control we are going to shoot for goal of 120/80 or less.  She will continue amlodipine 10 mg daily.  She will change her carvedilol 6.25 mg twice a day to 3 times a day.  She was discharged from the hospital at 25 mg twice a day but she was getting very hypotensive and orthostatic.  She still had similar symptoms even on 12.5 mg twice a day.  6.25 mg twice a day in the last few days seem to be not quite strong enough.  So  we will settle on 6.25 mg carvedilol 3 times a day for the time being  Limit triptans if possible regarding her migraines.  She can reach out to neurology going forward about other options.  Given the unusual spike in blood pressure and dissection with this diagnosis, hospitalist really did not want her on triptans or estrogen-based hormones  For now although her migraines have been well-controlled on NuvaRing and she really does not want to change this.  She will also follow-up with gynecology in the coming months to discuss progesterone only  She saw gastroenterology today. I reviewed her gastroenterology note.  She saw Dr. Elnoria Howard today 12/13/2023.  She went for recent Hemoccult positive test that she did from [place] order directly.  She is also felt she had seen some blood in the stool and wanted colon cancer screening.  Her last colonoscopy was 5 years ago.  At this time Dr. Elnoria Howard felt that she did not need a repeat colonoscopy, she apparently had a negative Hemoccult in the office today even on Eliquis blood thinner and he did not feel be safe for her to do evaluation at this time given the recent SMA dissection.  Regarding her arm numbness and discomfort, I advised she do some warm bath cloth on her arm elevated on a pillow a couple times a day for the next few days.  I suspect the symptoms should resolve.  No obvious deformity other than the bruising.  No obvious sign of phlebitis on exam  Continue Synthroid as usual, continue her asthma medication as usual, Symbicort, albuterol as needed.  Referral today to rheumatology to explore the possibility of underlying connective tissue disease rheumatological disease that could be related to the SMA dissection  Referral for CT coronary calcium score at her request   Barbara Odonnell was seen today for hospitalization follow-up.  Diagnoses and all orders for this visit:  Dissection of mesenteric artery (HCC) -     Ambulatory referral to Rheumatology -     CT  CARDIAC SCORING (SELF PAY ONLY); Future  Moderate persistent asthma, unspecified whether complicated -     Ambulatory referral to Rheumatology  Hypothyroidism, unspecified type -     Ambulatory referral to Rheumatology  Family history of premature CAD -     Ambulatory referral to Rheumatology -     CT CARDIAC SCORING (SELF PAY ONLY); Future  Chronic migraine without aura without status migrainosus, not intractable -     Ambulatory referral to Rheumatology  Raynaud's phenomenon without gangrene -     Ambulatory referral to Rheumatology  Screening for heart disease -     CT CARDIAC SCORING (SELF PAY ONLY); Future    Follow up: with referrals, vascular in March, and message me back in 4-5 days regarding BP readings.

## 2023-12-14 ENCOUNTER — Encounter (HOSPITAL_BASED_OUTPATIENT_CLINIC_OR_DEPARTMENT_OTHER): Payer: Self-pay

## 2023-12-14 ENCOUNTER — Inpatient Hospital Stay (HOSPITAL_BASED_OUTPATIENT_CLINIC_OR_DEPARTMENT_OTHER): Admission: RE | Admit: 2023-12-14 | Payer: BC Managed Care – PPO | Source: Ambulatory Visit | Admitting: Radiology

## 2023-12-14 ENCOUNTER — Other Ambulatory Visit: Payer: Self-pay

## 2023-12-14 DIAGNOSIS — I7779 Dissection of other artery: Secondary | ICD-10-CM

## 2023-12-14 DIAGNOSIS — Z1231 Encounter for screening mammogram for malignant neoplasm of breast: Secondary | ICD-10-CM

## 2023-12-14 NOTE — Progress Notes (Signed)
sent

## 2023-12-18 ENCOUNTER — Ambulatory Visit (HOSPITAL_BASED_OUTPATIENT_CLINIC_OR_DEPARTMENT_OTHER)
Admission: RE | Admit: 2023-12-18 | Discharge: 2023-12-18 | Disposition: A | Discharge status: Home / Self Care | Payer: Self-pay | Source: Ambulatory Visit | Attending: Medical | Admitting: Medical

## 2023-12-18 ENCOUNTER — Ambulatory Visit: Payer: BC Managed Care – PPO | Admitting: Medical

## 2023-12-18 ENCOUNTER — Other Ambulatory Visit: Payer: BC Managed Care – PPO

## 2023-12-18 ENCOUNTER — Ambulatory Visit (HOSPITAL_BASED_OUTPATIENT_CLINIC_OR_DEPARTMENT_OTHER)
Admission: RE | Admit: 2023-12-18 | Discharge: 2023-12-18 | Disposition: A | Discharge status: Home / Self Care | Payer: BC Managed Care – PPO | Source: Ambulatory Visit

## 2023-12-18 ENCOUNTER — Encounter (HOSPITAL_BASED_OUTPATIENT_CLINIC_OR_DEPARTMENT_OTHER): Payer: Self-pay | Admitting: Radiology

## 2023-12-18 ENCOUNTER — Other Ambulatory Visit: Payer: Self-pay | Admitting: Medical

## 2023-12-18 VITALS — BP 110/60 | HR 64 | Wt 151.6 lb

## 2023-12-18 DIAGNOSIS — M25473 Effusion, unspecified ankle: Secondary | ICD-10-CM | POA: Diagnosis not present

## 2023-12-18 DIAGNOSIS — Z8249 Family history of ischemic heart disease and other diseases of the circulatory system: Secondary | ICD-10-CM | POA: Insufficient documentation

## 2023-12-18 DIAGNOSIS — Z1231 Encounter for screening mammogram for malignant neoplasm of breast: Secondary | ICD-10-CM | POA: Insufficient documentation

## 2023-12-18 DIAGNOSIS — T148XXA Other injury of unspecified body region, initial encounter: Secondary | ICD-10-CM

## 2023-12-18 DIAGNOSIS — D509 Iron deficiency anemia, unspecified: Secondary | ICD-10-CM

## 2023-12-18 DIAGNOSIS — L989 Disorder of the skin and subcutaneous tissue, unspecified: Secondary | ICD-10-CM | POA: Diagnosis not present

## 2023-12-18 DIAGNOSIS — I7779 Dissection of other artery: Secondary | ICD-10-CM | POA: Diagnosis not present

## 2023-12-18 DIAGNOSIS — Z136 Encounter for screening for cardiovascular disorders: Secondary | ICD-10-CM | POA: Insufficient documentation

## 2023-12-18 DIAGNOSIS — Z7901 Long term (current) use of anticoagulants: Secondary | ICD-10-CM | POA: Diagnosis not present

## 2023-12-18 DIAGNOSIS — Z13220 Encounter for screening for lipoid disorders: Secondary | ICD-10-CM | POA: Diagnosis not present

## 2023-12-18 DIAGNOSIS — Z0000 Encounter for general adult medical examination without abnormal findings: Secondary | ICD-10-CM

## 2023-12-18 DIAGNOSIS — E039 Hypothyroidism, unspecified: Secondary | ICD-10-CM

## 2023-12-18 DIAGNOSIS — I7300 Raynaud's syndrome without gangrene: Secondary | ICD-10-CM

## 2023-12-18 DIAGNOSIS — Q796 Ehlers-Danlos syndrome, unspecified: Secondary | ICD-10-CM | POA: Insufficient documentation

## 2023-12-18 NOTE — Progress Notes (Signed)
 Results sent through MyChart

## 2023-12-18 NOTE — Progress Notes (Signed)
 Subjective:  Barbara Odonnell is a 48 y.o. female who presents for Chief Complaint  Patient presents with   Consult    Discuss BP readings over the week. Swelling in both feet and ankles Friday. Had petchiae (red dots) in weird spots. Has bruise on left leg- could this be just a bruise or bloot clot, sleeping with feet up.  New rx for Coreg, refill zofran     Here for follow-up from weeks appointment.  I saw her last week for hospital follow-up.  We made those changes with carvedilol last week.  She has been seeing a lot of fluctuations in her blood pressures since the hospital discharge.  Over the weekend from Friday through today 3 days, she has been using carvedilol 12.5 mg 3 times a day.   She is dosing 12.5 mg at midmorning between 10:30 AM and 12:30 PM, second dose around 6:30 to 8:30 PM, and a third dose around 3 AM to 5 AM. This seems to be doing a fairly good job of keeping her blood pressures low enough out of the danger zone.  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  Going up and down the stairs in her house can cause her blood pressure to bump up even around 130 systolic  Her blood pressures are lowest in the morning around 5 or 6 AM and she can see numbers as low was 82/76 or 80/64  Last week we try going to carvedilol 6.25 mg 3 times a day but her pressures are going up closer to 130+ or close to 90 diastolic.  Just over the weekend she changed to 12.5 mg 3 times a day carvedilol  She is using her amlodipine in the evening.    Over the weekend she noticed some swelling in her ankles.  She is using her compression socks.  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  At this point she cannot do much of anything per doctors orders until the dissection & clot resolves  She also has a bruise on her left leg just below the  knee on the shin and what she is noting as petechiae a small little brownish-pink lesion on her right upper arm, left upper thigh, left lower leg anteriorly and just adjacent to her nose on the left  She has a follow-up appointment with Dr. Arenac Lions,  cardiology on February 01, 2024.  She last saw him about 3 years ago.  She has follow-up with Dr. 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  She does have a history of hypothyroidism, migraines, anemia, celiac disease, asthma, but no prior hypertensive disease.  She does note a family history of premature heart disease in both paternal grandparents, and grandmother had a bulging aorta that was monitored.  Barbara Odonnell mother is very fit but on 2 BP medications.    She also notes her 2 brothers have history of gout.  There has been some concern in the past about Barbara Odonnell having connective tissue disease.  But no specific diagnosis.  No prior rheumatoid evaluation  Regarding her hospitalization,she went in for abdominal  pain that was severe in the left lower quadrant.  I had seen her virtually on January 30 2025th and she had a CT scan of her abdomen the day before 3 urology.  Only got results back the next day there was no obvious diverticulitis or other obvious finding.  She ended up going to the emergency department on December 01, 2023 and was admitted after a different scan showed possible SMA dissection.  She was admitted to the ICU.  Her blood pressure was quite elevated.  She was finally discharged after follow-up exam and consults.  She was discharged on blood pressure medicines.  The goal was to keep the blood pressure stable since surgical intervention was not ultimately advised.  Since discharge she had been getting quite low BP readings and  has been communicating with me through MyChart message.  As of December 08, 2023 she was getting blood pressure readings as low as 80/70, less than 100 systolic blood pressure irregular.  We readjusted her medicines earlier this week through our communication on MyChart.  I had her lower her dose of carvedilol from 25 twice a day to 6.25 mg twice a day starting 2 days ago 12/11/2023.  We continued amlodipine at 10 mg daily.  Even in the last 2 days she has had some blood pressure readings such as 130/90, 129/90.  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  She tried to do a plank the other day and her blood pressure rose up to 140 so she has been abstaining from all exercises lately.  Most of her blood pressure readings in the last 24 hours range between 100/70 up to 124/88  She continues on Eliquis 5 mg twice daily, Aspirin 81mg  daily.   No other aggravating or relieving factors.    No other c/o.  Past Medical History:  Diagnosis Date   Allergy    Anemia    IV iron in past, prior to 2018   Asthma    Celiac disease    +prior biopsy   GERD (gastroesophageal reflux disease)    H/O bone density study 12/21/2017   normal   Hip fracture (HCC) 2008   right, Dr. Darrick Penna   History of stress test 2014   [facility]   Hypothyroidism    Migraine headache without aura    gets bad nausea.  does well on Naratriptan and Relpax.  Failed Imitrex, Maxalt   Stress fracture    severeral prior   Transfusion history    has had IV iron prior, Dr. Myna Hidalgo prior management   Current Outpatient Medications on File Prior to Visit  Medication Sig Dispense Refill   Albuterol Sulfate (PROAIR HFA IN) Inhale 1 puff into the lungs daily as needed.     amLODipine (NORVASC) 10 MG tablet Take 1 tablet (10 mg total) by mouth daily. 30 tablet 1   apixaban (ELIQUIS) 5 MG TABS tablet Take 1 tablet (5 mg total) by mouth  2 (two) times daily. 60 tablet 1   aspirin EC 81 MG tablet Take 1 tablet (81 mg total) by mouth daily. Swallow whole. 30 tablet 12   bisacodyl (DULCOLAX) 5 MG EC tablet Take 1 tablet (5 mg total) by mouth daily as needed for moderate constipation or mild constipation. 30 tablet 0   budesonide-formoterol (SYMBICORT) 160-4.5 MCG/ACT inhaler Inhale 2 puffs into the lungs 2 (two) times daily. in the  morning and at bedtime. 10.2 each 11   buPROPion (WELLBUTRIN XL) 150 MG 24 hr tablet TAKE 1 TABLET BY MOUTH EVERY DAY 90 tablet 1   calcium carbonate (TUMS - DOSED IN MG ELEMENTAL CALCIUM) 500 MG chewable tablet Chew 1 tablet (200 mg of elemental calcium total) by mouth 3 (three) times daily as needed for indigestion or heartburn. 30 tablet 1   carvedilol (COREG) 6.25 MG tablet Take 1 tablet (6.25 mg total) by mouth 2 (two) times daily. (Patient taking differently: Take 12.5 mg by mouth in the morning, at noon, and at bedtime.) 60 tablet 0   cyclobenzaprine (FLEXERIL) 5 MG tablet Take 1 tablet (5 mg total) by mouth 3 (three) times daily as needed for muscle spasms (migraine). 30 tablet 0   diclofenac (FLECTOR) 1.3 % PTCH Place 1 patch onto the skin 2 (two) times daily. 60 patch 1   Fremanezumab-vfrm (AJOVY) 225 MG/1.5ML SOAJ Inject 225 mg into the skin every 30 (thirty) days. Please run copay card: BIN [id number] PCN PDMI GRP [id number] ID [id number] 1.5 mL 11   hyoscyamine (LEVSIN SL) 0.125 MG SL tablet Take 2 tablets (0.25 mg total) by mouth every 4 (four) hours as needed (bladder spasm). 30 tablet 0   ondansetron (ZOFRAN-ODT) 4 MG disintegrating tablet DISSOLVE 1 TABLET ON THE TONGUE EVERY 8 HOURS AS NEEDED FOR NAUSEA AND VOMITING 90 tablet 0   SYNTHROID 50 MCG tablet Take 1 tablet (50 mcg total) by mouth daily. 90 tablet 1   triamcinolone (KENALOG) 0.1 % Apply 1 Application topically.     valACYclovir (VALTREX) 500 MG tablet TAKE 1 TABLET BY MOUTH EVERY DAY **MYLAN BRAND** (Patient taking differently: Take 500 mg  by mouth daily as needed.  **MYLAN BRAND**) 90 tablet 0   No current facility-administered medications on file prior to visit.    The following portions of the patient's history were reviewed and updated as appropriate: allergies, current medications, past family history, past medical history, past social history, past surgical history and problem list.  ROS Otherwise as in subjective above    Objective: BP 110/60   Pulse 64   Wt 151 lb 9.6 oz (68.8 kg)   SpO2 100%   BMI 23.74 kg/m   Wt Readings from Last 3 Encounters:  12/18/23 151 lb 9.6 oz (68.8 kg)  12/13/23 156 lb 9.6 oz (71 kg)  12/05/23 151 lb 3.2 oz (68.6 kg)   BP Readings from Last 3 Encounters:  12/18/23 110/60  12/13/23 108/74  12/06/23 109/79    General appearance: alert, no distress, well developed, well nourished Heart regular rate rhythm, normal S1-S2, no murmurs Pulses upper and lower extremities normal There is a small purplish brown bruise on left lower leg just below the knee anteriorly and a somewhat linear fashion vertically, there is a small flat brown 2 mm diameter area on her right upper, no obvious purplish red coloration.  On her left lower leg above the bruise is a small 2 mm area of pinkish coloration, possible petechiae versus telangiectasia or other Minimal puffy edema left ankle in general     Assessment: Encounter Diagnoses  Name Primary?   Dissection of mesenteric artery (HCC) Yes   Hypothyroidism, unspecified type    Iron deficiency anemia, unspecified iron deficiency anemia type    Raynaud's phenomenon without gangrene    Ankle swelling, unspecified laterality    Skin lesion    Bruising       Plan: The main issue is still fluctuating blood  pressures.  I will forward the notes to her vascular surgeon Dr. Randie Heinz, to review in case he wants her to do something different.  We are trying to keep blood pressures 120/70 or less to avoid any kind of higher pressures given her dissection  that is being addressed from a conservative standpoint.  Over the weekend prior to Friday doing carvedilol 6.25 mg 3 times a day with still leading to some higher pressures so she settled on carvedilol 12.5 mg 3 times a day and it seems to be working okay.  Even though she is getting some low pressure readings in the early morning hours she is not feeling dizzy or lightheaded or faint.  I will reach out to Dr. Randie Heinz to make sure he is okay with this plan for now.  She is continuing on amlodipine daily and she is continued on Eliquis and aspirin.  Refilled Zofran for occasional nausea  Ankle swelling-likely combination of not being able to exercise like her norm and starting amlodipine recently.  Reassured.  Continue compression hose.  Skin lesion-not sure I would classify some of her new skin markings as petechiae.  There is a new bruise on the left lower leg but this looks more consistent with probably bumping into something.  We discussed if any worsening signs of bruising or frank obvious bruising purplish coloration or bleeding that is needed to get checked out right away.  Continue to monitor things for now   She had requested CT coronary which she goes for today.  She also has her mammogram today.    Barbara Odonnell was seen today for consult.  Diagnoses and all orders for this visit:  Dissection of mesenteric artery (HCC)  Hypothyroidism, unspecified type  Iron deficiency anemia, unspecified iron deficiency anemia type  Raynaud's phenomenon without gangrene  Ankle swelling, unspecified laterality  Skin lesion  Bruising    Follow up: with referrals, vascular in March, cardiology in April, and message me back in 4-5 days regarding BP readings.

## 2023-12-19 ENCOUNTER — Encounter: Payer: Self-pay | Admitting: Internal Medicine

## 2023-12-19 ENCOUNTER — Other Ambulatory Visit: Payer: Self-pay | Admitting: Medical

## 2023-12-19 LAB — LIPID PANEL
Chol/HDL Ratio: 3.7 {ratio} (ref 0.0–4.4)
Cholesterol, Total: 231 mg/dL — ABNORMAL HIGH (ref 100–199)
HDL: 62 mg/dL (ref >39)
LDL Chol Calc (NIH): 152 mg/dL — ABNORMAL HIGH (ref 0–99)
Triglycerides: 95 mg/dL (ref 0–149)
VLDL Cholesterol Cal: 17 mg/dL (ref 5–40)

## 2023-12-19 MED ORDER — CARVEDILOL 12.5 MG PO TABS: 12.5000 mg | ORAL_TABLET | Freq: Three times a day (TID) | ORAL | 1 refills | Status: DC

## 2023-12-19 MED ORDER — ROSUVASTATIN CALCIUM 10 MG PO TABS: 10.0000 mg | ORAL_TABLET | Freq: Every day | ORAL | 1 refills | Status: DC

## 2023-12-19 MED ORDER — AMLODIPINE BESYLATE 10 MG PO TABS: 10.0000 mg | ORAL_TABLET | Freq: Every day | ORAL | 1 refills | Status: DC

## 2023-12-19 NOTE — Progress Notes (Signed)
 Results sent through MyChart

## 2023-12-21 ENCOUNTER — Telehealth: Payer: Self-pay | Admitting: Neurology

## 2023-12-21 NOTE — Telephone Encounter (Signed)
 Pt asking for sooner appointment due to reason hospital stay and would like to revisit neurological medications with Dr. Lucia Gaskins

## 2023-12-22 ENCOUNTER — Telehealth: Payer: BC Managed Care – PPO | Admitting: Medical

## 2023-12-22 VITALS — BP 105/76 | HR 77 | Temp 98.8°F | Wt 153.0 lb

## 2023-12-22 DIAGNOSIS — I159 Secondary hypertension, unspecified: Secondary | ICD-10-CM

## 2023-12-22 DIAGNOSIS — E039 Hypothyroidism, unspecified: Secondary | ICD-10-CM | POA: Diagnosis not present

## 2023-12-22 DIAGNOSIS — E7800 Pure hypercholesterolemia, unspecified: Secondary | ICD-10-CM | POA: Insufficient documentation

## 2023-12-22 DIAGNOSIS — I7779 Dissection of other artery: Secondary | ICD-10-CM | POA: Diagnosis not present

## 2023-12-22 DIAGNOSIS — Z79899 Other long term (current) drug therapy: Secondary | ICD-10-CM

## 2023-12-22 DIAGNOSIS — Z7901 Long term (current) use of anticoagulants: Secondary | ICD-10-CM

## 2023-12-22 NOTE — Progress Notes (Signed)
 Subjective: This visit type was conducted due to national recommendations for restrictions regarding the COVID-19 Pandemic (e.g. social distancing) in an effort to limit this patient's exposure and mitigate transmission in our community.  Due to their co-morbid illnesses, this patient is at least at moderate risk for complications without adequate follow up.  This format is felt to be most appropriate for this patient at this time.    Documentation for virtual audio and video telecommunications through [place] encounter:  The patient was located at home. The provider was located in the office. The patient did consent to this visit and is aware of possible charges through their insurance for this visit.  The other persons participating in this telemedicine service were none. Time spent on call was 15 minutes and in review of previous records >20 minutes total.  This virtual service is not related to other E/M service within previous 7 days.    Barbara Odonnell is a 48 y.o. female who presents for Chief Complaint  Patient presents with   Consult    Review CT heart calcium test, Genetic testing for hyperlipidemia  Pros/cons of statin drug Add fasting glucose to April 7th labs      Virtual consult for f/u from earlier this week.  Since visit earlier in the week, BPs is looking ok.  Seeing 103-107 at rest SBP, but gets up to 120s when she is working in the afternoons.  She switched her amlodipine from evening to afternoon since her higher blood pressures are running in the afternoon.  That seems to be working so far.  As we discussed prior the goal is to keep her blood pressure as low as tolerable as conservative treatment for her SMA dissection  Still using coreg 9 hours apart, 12.5mg  TiD.  She has been reading about SMA case studies.  She heard about a link on the web for undiagnosed disease network.  She has written to them for possible study if she cannot find any other answers to  why she developed this SMA.  She does have a visit coming up with [facility] to evaluate for connective tissue disease.  When she has labs coming up in April fasting she wants to do a fasting glucose as well as a lipid profile test.  She has family history of hypercholesterolemia, LDL being elevated and brother and mother.  Her mother is healthy and takes a statin.  She also discussed her recent CT coronary test that was done this week  She continues on Eliquis 5 mg twice daily, Aspirin 81mg  daily.  No other aggravating or relieving factors.    No other c/o.  Past Medical History:  Diagnosis Date   Allergy    Anemia    IV iron in past, prior to 2018   Asthma    Celiac disease    +prior biopsy   GERD (gastroesophageal reflux disease)    H/O bone density study 12/21/2017   normal   Hip fracture (HCC) 2008   right, Dr. Darrick Penna   History of stress test 2014   [facility]   Hypothyroidism    Migraine headache without aura    gets bad nausea.  does well on Naratriptan and Relpax.  Failed Imitrex, Maxalt   Stress fracture    severeral prior   Transfusion history    has had IV iron prior, Dr. Myna Hidalgo prior management   Current Outpatient Medications on File Prior to Visit  Medication Sig Dispense Refill   Albuterol Sulfate (  PROAIR HFA IN) Inhale 1 puff into the lungs daily as needed.     amLODipine (NORVASC) 10 MG tablet Take 1 tablet (10 mg total) by mouth daily. 90 tablet 1   apixaban (ELIQUIS) 5 MG TABS tablet Take 1 tablet (5 mg total) by mouth 2 (two) times daily. 60 tablet 1   aspirin EC 81 MG tablet Take 1 tablet (81 mg total) by mouth daily. Swallow whole. 30 tablet 12   bisacodyl (DULCOLAX) 5 MG EC tablet Take 1 tablet (5 mg total) by mouth daily as needed for moderate constipation or mild constipation. 30 tablet 0   budesonide-formoterol (SYMBICORT) 160-4.5 MCG/ACT inhaler Inhale 2 puffs into the lungs 2 (two) times daily. in the morning and at bedtime. 10.2 each 11    buPROPion (WELLBUTRIN XL) 150 MG 24 hr tablet TAKE 1 TABLET BY MOUTH EVERY DAY 90 tablet 1   calcium carbonate (TUMS - DOSED IN MG ELEMENTAL CALCIUM) 500 MG chewable tablet Chew 1 tablet (200 mg of elemental calcium total) by mouth 3 (three) times daily as needed for indigestion or heartburn. 30 tablet 1   carvedilol (COREG) 12.5 MG tablet Take 1 tablet (12.5 mg total) by mouth in the morning, at noon, and at bedtime. 90 tablet 1   cyclobenzaprine (FLEXERIL) 5 MG tablet Take 1 tablet (5 mg total) by mouth 3 (three) times daily as needed for muscle spasms (migraine). 30 tablet 0   diclofenac (FLECTOR) 1.3 % PTCH Place 1 patch onto the skin 2 (two) times daily. 60 patch 1   Fremanezumab-vfrm (AJOVY) 225 MG/1.5ML SOAJ Inject 225 mg into the skin every 30 (thirty) days. Please run copay card: BIN [id number] PCN PDMI GRP [id number] ID [id number] 1.5 mL 11   hyoscyamine (LEVSIN SL) 0.125 MG SL tablet Take 2 tablets (0.25 mg total) by mouth every 4 (four) hours as needed (bladder spasm). 30 tablet 0   ondansetron (ZOFRAN-ODT) 4 MG disintegrating tablet DISSOLVE 1 TABLET ON THE TONGUE EVERY 8 HOURS AS NEEDED FOR NAUSEA AND VOMITING 90 tablet 0   rosuvastatin (CRESTOR) 10 MG tablet Take 1 tablet (10 mg total) by mouth daily. 90 tablet 1   SYNTHROID 50 MCG tablet Take 1 tablet (50 mcg total) by mouth daily. 90 tablet 1   triamcinolone (KENALOG) 0.1 % Apply 1 Application topically.     valACYclovir (VALTREX) 500 MG tablet TAKE 1 TABLET BY MOUTH EVERY DAY **MYLAN BRAND** (Patient taking differently: Take 500 mg by mouth daily as needed.  **MYLAN BRAND**) 90 tablet 0   No current facility-administered medications on file prior to visit.    The following portions of the patient's history were reviewed and updated as appropriate: allergies, current medications, past family history, past medical history, past social history, past surgical history and problem list.  ROS Otherwise as in subjective  above    Objective: BP 105/76   Pulse 77   Temp 98.8 F (37.1 C)   Wt 153 lb (69.4 kg)   SpO2 99%   BMI 23.96 kg/m   Wt Readings from Last 3 Encounters:  12/22/23 153 lb (69.4 kg)  12/18/23 151 lb 9.6 oz (68.8 kg)  12/13/23 156 lb 9.6 oz (71 kg)   BP Readings from Last 3 Encounters:  12/22/23 105/76  12/18/23 110/60  12/13/23 108/74   General appearance: alert, no distress, well developed, well nourished    Assessment: Encounter Diagnoses  Name Primary?   Dissection of mesenteric artery (HCC) Yes   Anticoagulated  Hypothyroidism, unspecified type    Elevated LDL cholesterol level    Secondary hypertension    High risk medication use        Plan: History of SMA dissection, hypertension-continue carvedilol 12.5 mg 3 times a day, continue amlodipine 10 mg in the afternoon.  This regimen was adjusted this week.  She will continue to monitor her blood pressures.  Goal is as low of blood pressure as tolerable per vascular surgery for the SMA to heal  Anticoagulated-continue Eliquis and aspirin  Hypothyroidism-continue current medication  Hypertension- same as SMA dissection plan above  Elevated LDL-she will return in April for fasting labs as below.  For now she will start rosuvastatin Crestor at 5 mg 3 times a week as opposed to the initial prescription for 10 mg daily.  We looked at her ASCVD risk which was less than 1% 10-year risk but she does have family history of hyperlipidemia/elevated LDL.  Her CT coronary calcium test this past week showed 0 score.   Marvelle was seen today for consult.  Diagnoses and all orders for this visit:  Dissection of mesenteric artery (HCC)  Anticoagulated  Hypothyroidism, unspecified type  Elevated LDL cholesterol level -     NMR, lipoprofile; Future -     Apolipoprotein B; Future -     Glucose, Random; Future -     ALT; Future -     Apolipoprotein A-1; Future  Secondary hypertension  High risk medication use -      ALT; Future    Follow up: with referrals, vascular in March, cardiology in April, [facility] soon

## 2023-12-25 ENCOUNTER — Other Ambulatory Visit: Payer: Self-pay | Admitting: Medical

## 2023-12-25 MED ORDER — ROSUVASTATIN CALCIUM 5 MG PO TABS
5.0000 mg | ORAL_TABLET | Freq: Every day | ORAL | 2 refills | Status: DC
Start: 2023-12-25 — End: 2024-04-10

## 2023-12-25 NOTE — Telephone Encounter (Signed)
 I called and LMVM for pt that did receive message.  Will make sure she is on wailtist I did not have anything right at the moment.

## 2023-12-28 DIAGNOSIS — Z8489 Family history of other specified conditions: Secondary | ICD-10-CM

## 2023-12-29 ENCOUNTER — Telehealth: Payer: BC Managed Care – PPO | Admitting: Medical

## 2023-12-29 ENCOUNTER — Other Ambulatory Visit: Payer: Self-pay | Admitting: Medical

## 2023-12-29 DIAGNOSIS — J4540 Moderate persistent asthma, uncomplicated: Secondary | ICD-10-CM

## 2023-12-29 DIAGNOSIS — I10 Essential (primary) hypertension: Secondary | ICD-10-CM

## 2023-12-29 DIAGNOSIS — I749 Embolism and thrombosis of unspecified artery: Secondary | ICD-10-CM | POA: Diagnosis not present

## 2023-12-29 DIAGNOSIS — G43909 Migraine, unspecified, not intractable, without status migrainosus: Secondary | ICD-10-CM | POA: Diagnosis not present

## 2024-01-02 ENCOUNTER — Encounter (HOSPITAL_BASED_OUTPATIENT_CLINIC_OR_DEPARTMENT_OTHER): Payer: Self-pay

## 2024-01-02 ENCOUNTER — Other Ambulatory Visit: Payer: Self-pay

## 2024-01-02 ENCOUNTER — Emergency Department (HOSPITAL_BASED_OUTPATIENT_CLINIC_OR_DEPARTMENT_OTHER)
Admission: EM | Admit: 2024-01-02 | Discharge: 2024-01-03 | Disposition: A | Discharge status: Home / Self Care | Attending: Emergency Medicine | Admitting: Emergency Medicine

## 2024-01-02 ENCOUNTER — Emergency Department (HOSPITAL_BASED_OUTPATIENT_CLINIC_OR_DEPARTMENT_OTHER)

## 2024-01-02 DIAGNOSIS — R1909 Other intra-abdominal and pelvic swelling, mass and lump: Secondary | ICD-10-CM | POA: Diagnosis not present

## 2024-01-02 DIAGNOSIS — Z7982 Long term (current) use of aspirin: Secondary | ICD-10-CM | POA: Insufficient documentation

## 2024-01-02 DIAGNOSIS — R1033 Periumbilical pain: Secondary | ICD-10-CM | POA: Insufficient documentation

## 2024-01-02 DIAGNOSIS — R109 Unspecified abdominal pain: Secondary | ICD-10-CM | POA: Diagnosis not present

## 2024-01-02 DIAGNOSIS — R1900 Intra-abdominal and pelvic swelling, mass and lump, unspecified site: Secondary | ICD-10-CM | POA: Insufficient documentation

## 2024-01-02 DIAGNOSIS — I7100 Dissection of unspecified site of aorta: Secondary | ICD-10-CM | POA: Diagnosis not present

## 2024-01-02 DIAGNOSIS — R079 Chest pain, unspecified: Secondary | ICD-10-CM | POA: Diagnosis not present

## 2024-01-02 LAB — CBC WITH DIFFERENTIAL/PLATELET
Abs Immature Granulocytes: 0.02 10*3/uL (ref 0.00–0.07)
Basophils Absolute: 0.1 10*3/uL (ref 0.0–0.1)
Basophils Relative: 1 %
Eosinophils Absolute: 0.5 10*3/uL (ref 0.0–0.5)
Eosinophils Relative: 8 %
HCT: 42.4 % (ref 36.0–46.0)
Hemoglobin: 14.7 g/dL (ref 12.0–15.0)
Immature Granulocytes: 0 %
Lymphocytes Relative: 30 %
Lymphs Abs: 1.9 10*3/uL (ref 0.7–4.0)
MCH: 32 pg (ref 26.0–34.0)
MCHC: 34.7 g/dL (ref 30.0–36.0)
MCV: 92.2 fL (ref 80.0–100.0)
Monocytes Absolute: 0.6 10*3/uL (ref 0.1–1.0)
Monocytes Relative: 10 %
Neutro Abs: 3.2 10*3/uL (ref 1.7–7.7)
Neutrophils Relative %: 51 %
Platelets: 257 10*3/uL (ref 150–400)
RBC: 4.6 MIL/uL (ref 3.87–5.11)
RDW: 12.3 % (ref 11.5–15.5)
WBC: 6.4 10*3/uL (ref 4.0–10.5)
nRBC: 0 % (ref 0.0–0.2)

## 2024-01-02 LAB — COMPREHENSIVE METABOLIC PANEL
ALT: 11 U/L (ref 0–44)
AST: 16 U/L (ref 15–41)
Albumin: 4.4 g/dL (ref 3.5–5.0)
Alkaline Phosphatase: 32 U/L — ABNORMAL LOW (ref 38–126)
Anion gap: 9 (ref 5–15)
BUN: 11 mg/dL (ref 6–20)
CO2: 25 mmol/L (ref 22–32)
Calcium: 9.6 mg/dL (ref 8.9–10.3)
Chloride: 103 mmol/L (ref 98–111)
Creatinine, Ser: 0.96 mg/dL (ref 0.44–1.00)
GFR, Estimated: >60 mL/min (ref >60)
Glucose, Bld: 83 mg/dL (ref 70–99)
Potassium: 3.9 mmol/L (ref 3.5–5.1)
Sodium: 137 mmol/L (ref 135–145)
Total Bilirubin: 0.5 mg/dL (ref 0.0–1.2)
Total Protein: 7.2 g/dL (ref 6.5–8.1)

## 2024-01-02 MED ORDER — IOHEXOL 350 MG/ML SOLN
100.0000 mL | Freq: Once | INTRAVENOUS | Status: AC | PRN
  Administered 2024-01-02: 100 mL via INTRAVENOUS

## 2024-01-02 MED ORDER — CARVEDILOL 12.5 MG PO TABS: 12.5000 mg | ORAL_TABLET | Freq: Two times a day (BID) | ORAL | Status: DC

## 2024-01-02 NOTE — ED Triage Notes (Signed)
 Patient states upper abdominal pain and noticed a "bulge" 1 am. 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

## 2024-01-03 ENCOUNTER — Ambulatory Visit (HOSPITAL_BASED_OUTPATIENT_CLINIC_OR_DEPARTMENT_OTHER): Admission: RE | Admit: 2024-01-03 | Payer: BC Managed Care – PPO | Source: Ambulatory Visit

## 2024-01-03 ENCOUNTER — Encounter: Payer: Self-pay | Admitting: Vascular Surgery

## 2024-01-03 ENCOUNTER — Encounter: Payer: Self-pay | Admitting: Neurology

## 2024-01-03 DIAGNOSIS — I790 Aneurysm of aorta in diseases classified elsewhere: Secondary | ICD-10-CM | POA: Diagnosis not present

## 2024-01-03 DIAGNOSIS — I7779 Dissection of other artery: Secondary | ICD-10-CM | POA: Diagnosis not present

## 2024-01-03 DIAGNOSIS — I10 Essential (primary) hypertension: Secondary | ICD-10-CM | POA: Diagnosis not present

## 2024-01-03 DIAGNOSIS — R1033 Periumbilical pain: Secondary | ICD-10-CM | POA: Diagnosis not present

## 2024-01-03 DIAGNOSIS — K55069 Acute infarction of intestine, part and extent unspecified: Secondary | ICD-10-CM | POA: Diagnosis not present

## 2024-01-03 DIAGNOSIS — Z1330 Encounter for screening examination for mental health and behavioral disorders, unspecified: Secondary | ICD-10-CM | POA: Diagnosis not present

## 2024-01-03 DIAGNOSIS — E7800 Pure hypercholesterolemia, unspecified: Secondary | ICD-10-CM | POA: Diagnosis not present

## 2024-01-03 MED ORDER — CARVEDILOL 12.5 MG PO TABS
12.5000 mg | ORAL_TABLET | Freq: Two times a day (BID) | ORAL | Status: DC
  Administered 2024-01-03: 12.5 mg via ORAL

## 2024-01-03 MED ORDER — CARVEDILOL 12.5 MG PO TABS
12.5000 mg | ORAL_TABLET | Freq: Two times a day (BID) | ORAL | Status: DC
  Filled 2024-01-03: qty 1

## 2024-01-03 NOTE — ED Provider Notes (Signed)
 [place] EMERGENCY DEPARTMENT AT [facility] Provider Note   CSN: [id number] Arrival date & time: 01/02/24  2003     History  Chief Complaint  Patient presents with   Abdominal Pain   Nausea    Patient states upper abdominal pain and noticed a "bulge" 1 am. 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 female.  48 yo F with a chief complaints of a new abdominal mass.  She noticed this maybe a few days ago.  Mildly tender.  Just superior and lateral to the right of the umbilicus.  She is worried because she has a history of her hernia as well has a history of a SMA dissection.  Came here for evaluation.  She denies nausea vomiting or fever.  Denies trauma.   Abdominal Pain      Home Medications Prior to Admission medications   Medication Sig Start Date End Date Taking? Authorizing Provider  Albuterol Sulfate (PROAIR HFA IN) Inhale 1 puff into the lungs daily as needed.    [provider]  amLODipine (NORVASC) 10 MG tablet Take 1 tablet (10 mg total) by mouth daily. 12/19/23   Tysinger, Kermit Balo, PA-C  apixaban (ELIQUIS) 5 MG TABS tablet Take 1 tablet (5 mg total) by mouth 2 (two) times daily. 12/05/23   Berton Mount I, MD  aspirin EC 81 MG tablet Take 1 tablet (81 mg total) by mouth daily. Swallow whole. 12/06/23   Barnetta Chapel, MD  bisacodyl (DULCOLAX) 5 MG EC tablet Take 1 tablet (5 mg total) by mouth daily as needed for moderate constipation or mild constipation. 12/05/23   Barnetta Chapel, MD  budesonide-formoterol (SYMBICORT) 160-4.5 MCG/ACT inhaler Inhale 2 puffs into the lungs 2 (two) times daily. in the morning and at bedtime. 11/06/23   Tysinger, Kermit Balo, PA-C  buPROPion (WELLBUTRIN XL) 150 MG 24 hr tablet TAKE 1 TABLET BY MOUTH EVERY DAY 10/30/23   Tysinger, Kermit Balo, PA-C  calcium carbonate (TUMS - DOSED IN MG  ELEMENTAL CALCIUM) 500 MG chewable tablet Chew 1 tablet (200 mg of elemental calcium total) by mouth 3 (three) times daily as needed for indigestion or heartburn. 12/05/23   Barnetta Chapel, MD  carvedilol (COREG) 12.5 MG tablet Take 1 tablet (12.5 mg total) by mouth in the morning, at noon, and at bedtime. 12/19/23 12/18/24  Tysinger, Kermit Balo, PA-C  cyclobenzaprine (FLEXERIL) 5 MG tablet Take 1 tablet (5 mg total) by mouth 3 (three) times daily as needed for muscle spasms (migraine). 12/05/23   Barnetta Chapel, MD  diclofenac (FLECTOR) 1.3 % PTCH Place 1 patch onto the skin 2 (two) times daily. 09/19/23   Hudnall, Azucena Fallen, MD  Fremanezumab-vfrm (AJOVY) 225 MG/1.5ML SOAJ Inject 225 mg into the skin every 30 (thirty) days. Please run copay card: BIN [id number] PCN PDMI GRP [id number] ID [id number] 08/14/23   Anson Fret, MD  hyoscyamine (LEVSIN SL) 0.125 MG SL tablet Take 2 tablets (0.25 mg total) by mouth every 4 (four) hours as needed (bladder spasm). 12/05/23   Berton Mount I, MD  ondansetron (ZOFRAN-ODT) 4 MG disintegrating tablet DISSOLVE 1 TABLET ON THE TONGUE EVERY 8 HOURS AS NEEDED FOR NAUSEA AND VOMITING 12/18/23   Tysinger, Kermit Balo, PA-C  rosuvastatin (CRESTOR) 10 MG tablet Take 1 tablet (10 mg total) by mouth daily. 12/19/23 12/18/24  Tysinger,  Kermit Balo, PA-C  rosuvastatin (CRESTOR) 5 MG tablet Take 1 tablet (5 mg total) by mouth daily. 12/25/23 12/24/24  Tysinger, Kermit Balo, PA-C  SYNTHROID 50 MCG tablet Take 1 tablet (50 mcg total) by mouth daily. 11/06/23   Tysinger, Kermit Balo, PA-C  triamcinolone (KENALOG) 0.1 % Apply 1 Application topically. 01/17/21   [provider]  valACYclovir (VALTREX) 500 MG tablet TAKE 1 TABLET BY MOUTH EVERY DAY **MYLAN BRAND** Patient taking differently: Take 500 mg by mouth daily as needed.  **MYLAN BRAND** 12/01/23   Tysinger, Kermit Balo, PA-C      Allergies    Sulfa antibiotics, Fentanyl, Gluten meal, Montelukast, Other, and Singulair [montelukast sodium]     Review of Systems   Review of Systems  Gastrointestinal:  Positive for abdominal pain.    Physical Exam Updated Vital Signs BP 107/68   Pulse 66   Temp 98.5 F (36.9 C)   Resp 18   Ht 5\' 7"  (1.702 m)   Wt 70.8 kg   SpO2 100%   BMI 24.43 kg/m  Physical Exam Vitals and nursing note reviewed.  Constitutional:      General: She is not in acute distress.    Appearance: She is well-developed. She is not diaphoretic.  HENT:     Head: Normocephalic and atraumatic.  Eyes:     Pupils: Pupils are equal, round, and reactive to light.  Cardiovascular:     Rate and Rhythm: Normal rate and regular rhythm.     Heart sounds: No murmur heard.    No friction rub. No gallop.  Pulmonary:     Effort: Pulmonary effort is normal.     Breath sounds: No wheezing or rales.  Abdominal:     General: There is no distension.     Palpations: Abdomen is soft.     Tenderness: There is no abdominal tenderness.     Comments: Small tender nodule just superior and lateral to the umbilicus.  Slightly mobile.  Not erythematous.  Musculoskeletal:        General: No tenderness.     Cervical back: Normal range of motion and neck supple.  Skin:    General: Skin is warm and dry.  Neurological:     Mental Status: She is alert and oriented to person, place, and time.  Psychiatric:        Behavior: Behavior normal.     ED Results / Procedures / Treatments   Labs (all labs ordered are listed, but only abnormal results are displayed) Labs Reviewed  COMPREHENSIVE METABOLIC PANEL - Abnormal; Notable for the following components:      Result Value   Alkaline Phosphatase 32 (*)    All other components within normal limits  CBC WITH DIFFERENTIAL/PLATELET    EKG None  Radiology CT Angio Chest/Abd/Pel for Dissection W and/or Wo Contrast Result Date: 01/03/2024 CLINICAL DATA:  Acute aortic syndrome suspected. Upper abdominal pain. A bulge noticed at 1 a.m. 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 EXAM: CT ANGIOGRAPHY CHEST, ABDOMEN AND PELVIS TECHNIQUE: CT abdomen and pelvis 12/01/2023 and coronary artery CT 12/18/2023; CTA chest 02/27/2019 Multidetector CT imaging through the chest, abdomen and pelvis was performed using the standard protocol during bolus administration of intravenous contrast. Multiplanar reconstructed images and MIPs were obtained and reviewed to evaluate the vascular anatomy. RADIATION DOSE REDUCTION: This exam was performed according to the departmental dose-optimization program which includes automated exposure control, adjustment of the mA and/or kV according to patient size  and/or use of iterative reconstruction technique. CONTRAST:  OMNIPAQUE IOHEXOL 350 MG/ML SOLN COMPARISON:  CT abdomen and pelvis 12/01/2023 and coronary artery CT 12/18/2023; CTA chest 02/27/2019 FINDINGS: CTA CHEST FINDINGS Cardiovascular: No intramural hematoma, dissection, or penetrating atherosclerotic ulcer. Normal caliber thoracic aorta no pulmonary embolism. No pericardial effusion. Mediastinum/Nodes: Trachea and esophagus are unremarkable. Thoracic adenopathy. Lungs/Pleura: No focal consolidation, pleural effusion, or pneumothorax. Musculoskeletal: No acute fracture. Review of the MIP images confirms the above findings. CTA ABDOMEN AND PELVIS FINDINGS VASCULAR Aorta: Normal caliber aorta without aneurysm, dissection, vasculitis or significant stenosis. Celiac: Patent without evidence of aneurysm, dissection, vasculitis or significant stenosis. SMA: No significant change in the dissection of the proximal SMA with mural thrombus within the false lumen which causes severe stenosis. 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 No occlusion. Renals: Both renal arteries are patent without evidence of aneurysm, dissection, vasculitis, fibromuscular dysplasia or significant stenosis. IMA: Patent without evidence of aneurysm, dissection, vasculitis  or significant stenosis. Inflow: Patent without evidence of aneurysm, dissection, vasculitis or significant stenosis. Veins: No obvious venous abnormality within the limitations of this arterial phase study. Review of the MIP images confirms the above findings. NON-VASCULAR Hepatobiliary: No acute abnormality. Pancreas: Unremarkable. Spleen: Unremarkable. Adrenals/Urinary Tract: Stable adrenal glands and kidneys. No urinary calculi or hydronephrosis. Nondistended bladder. Stomach/Bowel: Normal caliber large and small bowel. No bowel wall thickening. Stomach is within normal limits. Normal appendix. Lymphatic: No lymphadenopathy. Reproductive: No acute abnormality. Other: No free intraperitoneal fluid or air. Musculoskeletal: No acute fracture. Review of the MIP images confirms the above findings. IMPRESSION: 1. No acute aortic syndrome. 2. No acute abnormality in the chest, abdomen, or pelvis. 3. 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 Electronically Signed   By: Minerva Fester M.D.   On: 01/03/2024 01:46    Procedures Procedures    Medications Ordered in ED Medications  carvedilol (COREG) tablet 12.5 mg (12.5 mg Oral Given 01/03/24 0046)  iohexol (OMNIPAQUE) 350 MG/ML injection 100 mL (100 mLs Intravenous Contrast Given 01/02/24 2343)    ED Course/ Medical Decision Making/ A&P                                 Medical Decision Making Amount and/or Complexity of Data Reviewed Labs: ordered.  Risk Prescription drug management.   48 yo F with a significant past medical history of an SMA dissection with thrombosis comes in with a chief complaints of a new abdominal mass.  She was worried that perhaps she had worsening of her dissection.  Clinically it appears to be more consistent with a lymph node or perhaps a lipoma.  Not  consistent with a hernia.  Awaiting CT angiogram.  Radiology read of the CT angiogram without significant change from prior.  Still demonstrates a SMA dissection.  SMA aneurysm.  I discussed the results with the patient.  She actually has a repeat CT scan scheduled for tomorrow.  Encouraged her to call the vascular surgeon and see if they wanted any different imaging.  2:10 AM:  I have discussed the diagnosis/risks/treatment options with the patient.  Evaluation and diagnostic testing in the emergency department does not suggest an emergent condition requiring admission or immediate intervention beyond what has been  performed at this time.  They will follow up with PCP, vascular. We also discussed returning to the ED immediately if new or worsening sx occur. We discussed the sx which are most concerning (e.g., sudden worsening pain, fever, inability to tolerate by mouth) that necessitate immediate return. Medications administered to the patient during their visit and any new prescriptions provided to the patient are listed below.  Medications given during this visit Medications  carvedilol (COREG) tablet 12.5 mg (12.5 mg Oral Given 01/03/24 0046)  iohexol (OMNIPAQUE) 350 MG/ML injection 100 mL (100 mLs Intravenous Contrast Given 01/02/24 2343)     The patient appears reasonably screen and/or stabilized for discharge and I doubt any other medical condition or other [facility] requiring further screening, evaluation, or treatment in the ED at this time prior to discharge.          Final Clinical Impression(s) / ED Diagnoses Final diagnoses:  Abdominal wall bulge    Rx / DC Orders ED Discharge Orders     None         Melene Plan, DO 01/03/24 0210

## 2024-01-03 NOTE — Discharge Instructions (Signed)
 I think the area that you are worried about it is either a lymph node over an area of fatty tissue.  Please follow-up with your family doctor in the office.  They can continue to watch this.  Hopefully this goes away or if it continues to bother you they may have you evaluated by general surgeon.

## 2024-01-03 NOTE — Progress Notes (Signed)
 Results sent through MyChart

## 2024-01-04 ENCOUNTER — Ambulatory Visit: Admitting: Family Medicine

## 2024-01-04 ENCOUNTER — Telehealth: Payer: Self-pay

## 2024-01-04 VITALS — BP 122/74 | HR 76 | Wt 157.6 lb

## 2024-01-04 DIAGNOSIS — I7779 Dissection of other artery: Secondary | ICD-10-CM | POA: Diagnosis not present

## 2024-01-04 DIAGNOSIS — R58 Hemorrhage, not elsewhere classified: Secondary | ICD-10-CM

## 2024-01-04 DIAGNOSIS — Z7901 Long term (current) use of anticoagulants: Secondary | ICD-10-CM | POA: Diagnosis not present

## 2024-01-04 NOTE — Progress Notes (Signed)
   Subjective:    Patient ID: Barbara Odonnell, female    DOB: 1975/12/18, 48 y.o.   MRN: [id number]  HPI She has experienced a superior mesenteric artery dissection and is being cared for with medications including Coreg, Eliquis and amlodipine.  Recently she did note some swelling and discoloration distal to where she had rolled her socks and down on her ankle.  She is concerned about bleeding.  She has had no nosebleeds, bleeding from her gums, other ecchymotic areas.   Review of Systems     Objective:    Physical Exam Ecchymotic area is noted just distal to where her socks were rolled down.  Is slightly tender to palpation otherwise the Achilles tendon appears normal.  No other skin lesions are noted.  Denna Haggard' sign is negative.       Assessment & Plan:  Dissection of mesenteric artery (HCC)  Anticoagulated  Ecchymosis  I explained that I thought it was some minor vascular damage from rolling her socks down and there was no evidence of any other bleeding therefore no concerns.  Did address signs and symptoms to look for in terms of problems with Eliquis.

## 2024-01-04 NOTE — Telephone Encounter (Signed)
 Patient sent mychart message stating PA needed for Ajovy. This is her response to Ajovy:  [name] to [facility] Clinical Pool (supporting Anson Fret, MD)     01/04/24  8:28 AM Hi [name],   I've been using the samples that the Dr. provided and they have really helped. I've gone from migraines most days of the week to 1-2 per week.  Yes, I have the same plan too. It has also helped with other headaches too and I've seen a significant reduction in how often I need to use my triptans.  I only probably get 1-2 total headaches/migraines per week now.  Let me know if you need anything else and thank you for working with the prior authorization.    Best,   Barbara Odonnell

## 2024-01-04 NOTE — Telephone Encounter (Signed)
 PA request has been Submitted. New Encounter has been or will be created for follow up. For additional info see Pharmacy Prior Auth telephone encounter from 03/06.

## 2024-01-04 NOTE — Telephone Encounter (Signed)
*  [facility]  Pharmacy Patient Advocate Encounter   Received notification from Pt Calls Messages that prior authorization for AJOVY (fremanezumab-vfrm) injection 225MG /1.5ML auto-injectors  is required/requested.   Insurance verification completed.   The patient is insured through [facility] .   Per test claim: PA required; PA submitted to above mentioned insurance via CoverMyMeds Key/confirmation #/EOC [id number] Status is pending

## 2024-01-05 NOTE — Telephone Encounter (Signed)
 Pharmacy Patient Advocate Encounter  Received notification from [facility] that Prior Authorization for AJOVY (fremanezumab-vfrm) injection 225MG /1.5ML auto-injectors  has been APPROVED from 01/04/2024 to 01/03/2025   PA #/Case ID/Reference #: [id number]

## 2024-01-06 ENCOUNTER — Other Ambulatory Visit: Payer: Self-pay

## 2024-01-06 ENCOUNTER — Emergency Department (HOSPITAL_BASED_OUTPATIENT_CLINIC_OR_DEPARTMENT_OTHER)
Admission: EM | Admit: 2024-01-06 | Discharge: 2024-01-06 | Disposition: A | Discharge status: Home / Self Care | Attending: Emergency Medicine | Admitting: Emergency Medicine

## 2024-01-06 ENCOUNTER — Encounter (HOSPITAL_BASED_OUTPATIENT_CLINIC_OR_DEPARTMENT_OTHER): Payer: Self-pay | Admitting: *Deleted

## 2024-01-06 DIAGNOSIS — I952 Hypotension due to drugs: Secondary | ICD-10-CM | POA: Insufficient documentation

## 2024-01-06 DIAGNOSIS — Z7982 Long term (current) use of aspirin: Secondary | ICD-10-CM | POA: Diagnosis not present

## 2024-01-06 DIAGNOSIS — Z7901 Long term (current) use of anticoagulants: Secondary | ICD-10-CM | POA: Diagnosis not present

## 2024-01-06 DIAGNOSIS — R42 Dizziness and giddiness: Secondary | ICD-10-CM | POA: Diagnosis not present

## 2024-01-06 DIAGNOSIS — I959 Hypotension, unspecified: Secondary | ICD-10-CM | POA: Diagnosis not present

## 2024-01-06 NOTE — ED Triage Notes (Addendum)
 Pt to ED reporting for the past 90 minutes she has been feeling flushed with hypotensive readings at home after taking lisinopril and coreg. Pt newer on these medications due to BP management post discovery of a superior mesenteric artery dissection without repair. Pt reports similar feelings to a recent allergic reaction to Fentanyl while in the hospital.   Blood pressures at home were:  1519 - 106/76 1608 - 90/65 1611 - 70/55 1614 101/70   Pt reports having taken a lot of salt to try and raise the blood pressure and upon arrival to ED is hypertensive after multiple checks on both arms.

## 2024-01-06 NOTE — ED Provider Notes (Signed)
 [place] EMERGENCY DEPARTMENT AT [facility] Provider Note   CSN: [id number] Arrival date & time: 01/06/24  1723     History  No chief complaint on file.   Barbara Odonnell is a 48 y.o. female.  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  Since that time, patient has been under very strict blood pressure controls.  She reports due to bilateral ankle swelling, there was a recent medication change.  Patient's amlodipine was cut back to 5 mg and she was started on 5 mg of lisinopril which was a new medication for her.  She reports that her blood pressure started trending quite low within several hours of taking lisinopril and Coreg.  And she felt lightheaded and weak.  Lowest measured blood pressure at home was 70 systolic.  She reports she felt really bad and weak when her blood pressure was at low.  Patient denies she has been having any chest pain or shortness of breath.  She did not have a syncopal episode.  Reports she tried eating some salt and doing some squats and raising her legs to bring her blood pressure up.  She was still feeling symptomatic and came to the emergency department.  Upon arrival blood pressure started trending up and symptoms have resolved.    Home Medications Prior to Admission medications   Medication Sig Start Date End Date Taking? Authorizing Provider  Albuterol Sulfate (PROAIR HFA IN) Inhale 1 puff into the lungs daily as needed.    [provider]  amLODipine (NORVASC) 10 MG tablet Take 1 tablet (10 mg total) by mouth daily. 12/19/23   Tysinger, Kermit Balo, PA-C  apixaban (ELIQUIS) 5 MG TABS tablet Take 1 tablet (5 mg total) by mouth 2 (two) times daily. 12/05/23   Berton Mount I, MD  aspirin EC 81 MG tablet Take 1 tablet (81 mg total) by mouth daily. Swallow whole. 12/06/23   Barnetta Chapel, MD  bisacodyl (DULCOLAX) 5 MG EC tablet Take 1 tablet (5 mg total) by mouth daily as needed for moderate constipation  or mild constipation. 12/05/23   Barnetta Chapel, MD  budesonide-formoterol (SYMBICORT) 160-4.5 MCG/ACT inhaler Inhale 2 puffs into the lungs 2 (two) times daily. in the morning and at bedtime. 11/06/23   Tysinger, Kermit Balo, PA-C  buPROPion (WELLBUTRIN XL) 150 MG 24 hr tablet TAKE 1 TABLET BY MOUTH EVERY DAY 10/30/23   Tysinger, Kermit Balo, PA-C  calcium carbonate (TUMS - DOSED IN MG ELEMENTAL CALCIUM) 500 MG chewable tablet Chew 1 tablet (200 mg of elemental calcium total) by mouth 3 (three) times daily as needed for indigestion or heartburn. 12/05/23   Barnetta Chapel, MD  carvedilol (COREG) 12.5 MG tablet Take 1 tablet (12.5 mg total) by mouth in the morning, at noon, and at bedtime. 12/19/23 12/18/24  Tysinger, Kermit Balo, PA-C  cyclobenzaprine (FLEXERIL) 5 MG tablet Take 1 tablet (5 mg total) by mouth 3 (three) times daily as needed for muscle spasms (migraine). 12/05/23   Barnetta Chapel, MD  diclofenac (FLECTOR) 1.3 % PTCH Place 1 patch onto the skin 2 (two) times daily. 09/19/23   Hudnall, Azucena Fallen, MD  Fremanezumab-vfrm (AJOVY) 225 MG/1.5ML SOAJ Inject 225 mg into the skin every 30 (thirty) days. Please run copay card: BIN [id number] PCN [id number] GRP [id number] ID [id number] 08/14/23   Anson Fret, MD  hyoscyamine (LEVSIN SL) 0.125 MG SL tablet Take 2 tablets (0.25 mg total) by mouth every 4 (four) hours as  needed (bladder spasm). 12/05/23   Berton Mount I, MD  ondansetron (ZOFRAN-ODT) 4 MG disintegrating tablet DISSOLVE 1 TABLET ON THE TONGUE EVERY 8 HOURS AS NEEDED FOR NAUSEA AND VOMITING 12/18/23   Tysinger, Kermit Balo, PA-C  rosuvastatin (CRESTOR) 10 MG tablet Take 1 tablet (10 mg total) by mouth daily. Patient not taking: Reported on 01/04/2024 12/19/23 12/18/24  Tysinger, Kermit Balo, PA-C  rosuvastatin (CRESTOR) 5 MG tablet Take 1 tablet (5 mg total) by mouth daily. 12/25/23 12/24/24  Tysinger, Kermit Balo, PA-C  SYNTHROID 50 MCG tablet Take 1 tablet (50 mcg total) by mouth daily. 11/06/23   Tysinger, Kermit Balo, PA-C   triamcinolone (KENALOG) 0.1 % Apply 1 Application topically. 01/17/21   [provider]  valACYclovir (VALTREX) 500 MG tablet TAKE 1 TABLET BY MOUTH EVERY DAY **MYLAN BRAND** Patient taking differently: Take 500 mg by mouth daily as needed.  **MYLAN BRAND** 12/01/23   Tysinger, Kermit Balo, PA-C      Allergies    Sulfa antibiotics, Fentanyl, Gluten meal, Montelukast, Other, and Singulair [montelukast sodium]    Review of Systems   Review of Systems  Physical Exam Updated Vital Signs BP 116/83   Pulse 86   Temp 98.2 F (36.8 C)   Resp 18   Ht 5\' 7"  (1.702 m)   Wt 71.5 kg   LMP  (LMP Unknown)   SpO2 100%   BMI 24.69 kg/m  Physical Exam Constitutional:      Appearance: Normal appearance.  HENT:     Head: Normocephalic and atraumatic.     Mouth/Throat:     Pharynx: Oropharynx is clear.  Eyes:     Extraocular Movements: Extraocular movements intact.  Cardiovascular:     Rate and Rhythm: Normal rate and regular rhythm.     Pulses: Normal pulses.     Heart sounds: Normal heart sounds.  Pulmonary:     Effort: Pulmonary effort is normal.     Breath sounds: Normal breath sounds.  Abdominal:     General: There is no distension.     Palpations: Abdomen is soft.     Tenderness: There is no abdominal tenderness. There is no guarding.  Musculoskeletal:        General: No tenderness. Normal range of motion.  Skin:    General: Skin is warm and dry.  Neurological:     General: No focal deficit present.     Mental Status: She is alert and oriented to person, place, and time.     Motor: No weakness.     Coordination: Coordination normal.  Psychiatric:        Mood and Affect: Mood normal.     ED Results / Procedures / Treatments   Labs (all labs ordered are listed, but only abnormal results are displayed) Labs Reviewed - No data to display  EKG EKG Interpretation Date/Time:  Saturday January 06 2024 17:37:46 EST Ventricular Rate:  72 PR Interval:  132 QRS  Duration:  70 QT Interval:  372 QTC Calculation: 407 R Axis:   71  Text Interpretation: Normal sinus rhythm Cannot rule out Anterior infarct , age undetermined Abnormal ECG When compared with ECG of 18-Feb-2019 15:32, QT has shortened no sig change from previous Confirmed by Arby Barrette [phone]) on 01/06/2024 8:27:35 PM  Radiology No results found.  Procedures Procedures    Medications Ordered in ED Medications - No data to display  ED Course/ Medical Decision Making/ A&P  Medical Decision Making  Patient an episode of hypotension as outlined above.  She did not have any symptoms leading up to this episode other than new blood pressure medication regimen.  Patient has not had vomiting or diarrhea.  No volume loss.  No appetite loss.  She is otherwise been doing very well.  As the blood pressure has now normalized, she is asymptomatic.  She is not having chest pain or dyspnea.  Patient does not have hypoxia.  Physical exam is normal.  At this time she agrees, this seems very likely due to the blood pressure change and she anticipates going back to her prior regimen with amlodipine and discontinuing the lisinopril.  She reports this was effective and the change was only made due to the side effect of ankle swelling she was experiencing with amlodipine.  Patient will return with any kind of concerns or changes.  She will continue to work with her doctor for medication management.        Final Clinical Impression(s) / ED Diagnoses Final diagnoses:  Hypotension due to medication    Rx / DC Orders ED Discharge Orders     None         Arby Barrette, MD 01/06/24 2029

## 2024-01-08 ENCOUNTER — Ambulatory Visit: Admitting: Medical

## 2024-01-08 DIAGNOSIS — I7779 Dissection of other artery: Secondary | ICD-10-CM

## 2024-01-08 DIAGNOSIS — I7300 Raynaud's syndrome without gangrene: Secondary | ICD-10-CM

## 2024-01-08 DIAGNOSIS — G43709 Chronic migraine without aura, not intractable, without status migrainosus: Secondary | ICD-10-CM

## 2024-01-08 DIAGNOSIS — E039 Hypothyroidism, unspecified: Secondary | ICD-10-CM

## 2024-01-08 DIAGNOSIS — J4540 Moderate persistent asthma, uncomplicated: Secondary | ICD-10-CM

## 2024-01-08 DIAGNOSIS — Z8249 Family history of ischemic heart disease and other diseases of the circulatory system: Secondary | ICD-10-CM

## 2024-01-10 ENCOUNTER — Encounter: Payer: Self-pay | Admitting: Vascular Surgery

## 2024-01-10 ENCOUNTER — Other Ambulatory Visit: Payer: Self-pay | Admitting: Medical

## 2024-01-10 ENCOUNTER — Ambulatory Visit (INDEPENDENT_AMBULATORY_CARE_PROVIDER_SITE_OTHER): Payer: BC Managed Care – PPO | Admitting: Vascular Surgery

## 2024-01-10 VITALS — BP 123/79 | HR 74 | Temp 98.7°F | Ht 67.0 in | Wt 153.0 lb

## 2024-01-10 DIAGNOSIS — K625 Hemorrhage of anus and rectum: Secondary | ICD-10-CM | POA: Diagnosis not present

## 2024-01-10 DIAGNOSIS — R195 Other fecal abnormalities: Secondary | ICD-10-CM | POA: Diagnosis not present

## 2024-01-10 DIAGNOSIS — K551 Chronic vascular disorders of intestine: Secondary | ICD-10-CM | POA: Diagnosis not present

## 2024-01-10 DIAGNOSIS — R1033 Periumbilical pain: Secondary | ICD-10-CM | POA: Diagnosis not present

## 2024-01-10 NOTE — Progress Notes (Signed)
 Patient ID: Barbara Odonnell, female   DOB: 06/20/1976, 48 y.o.   MRN: [id number]  Reason for Consult: Routine Post Op   Referred by Barbara Canavan, PA-C  Subjective:     HPI:  Barbara Odonnell is a 48 y.o. female recently admitted with SMA dissection.  She is now on aspirin and Eliquis and has been tolerating well although has noted blood in her stool.  She is now following with Dr. Elnoria Odonnell with GI.  They are planning for endoscopy and colonoscopy.  She states that she has felt weak for the past several days but before that was eating without any limitation and not having any pain.  She had a knot in her upper abdomen which led her to obtain CT scan in the emergency department this remains present but she is not having as significant pain.  She is now doing Pilates and monitoring her blood pressure multiple times throughout the day.  She is currently on Coreg and Norvasc but having leg swelling and hopeful to switch partially over to lisinopril away from Norvasc due to the side effect.  Overall she is progressing well.  Past Medical History:  Diagnosis Date   Allergy    Anemia    IV iron in past, prior to 2018   Asthma    Celiac disease    +prior biopsy   GERD (gastroesophageal reflux disease)    H/O bone density study 12/21/2017   normal   Hip fracture (HCC) 2008   right, Dr. Darrick Odonnell   History of stress test 2014   [facility]   Hypothyroidism    Migraine headache without aura    gets bad nausea.  does well on Naratriptan and Relpax.  Failed Imitrex, Maxalt   Stress fracture    severeral prior   Transfusion history    has had IV iron prior, Dr. Myna Odonnell prior management   Family History  Problem Relation Age of Onset   Hypertension Mother    Osteopenia Mother    Raynaud syndrome Mother    Celiac disease Mother    Cancer Father        asbestos related lung cancer   Heart disease Father 74       Stent   Pulmonary embolism Father    Celiac disease Brother    Celiac disease  Brother    Stroke Maternal Grandmother    COPD Maternal Grandmother    Osteopenia Maternal Grandmother    Heart disease Maternal Grandfather    Stroke Maternal Grandfather    Heart disease Paternal Grandfather        died 49yo   Diabetes Neg Hx    Past Surgical History:  Procedure Laterality Date   COLONOSCOPY  2015   [facility], advised to repeat q5 years; x 4 prior   ESOPHAGOGASTRODUODENOSCOPY     x5 prior as of 2016   INGUINAL HERNIA REPAIR     left   NASAL SEPTUM SURGERY     POLYPECTOMY     sigmoid colon   SINOSCOPY     WISDOM TOOTH EXTRACTION      Short Social History:  Social History   Tobacco Use   Smoking status: Never   Smokeless tobacco: Never  Substance Use Topics   Alcohol use: Yes    Alcohol/week: 2.0 standard drinks of alcohol    Types: 1 Glasses of wine, 1 Shots of liquor per week    Comment: rare    Allergies  Allergen Reactions  Sulfa Antibiotics Anaphylaxis   Fentanyl Other (See Comments)    BP drop   Gluten Meal     Celiac Disease   Montelukast Other (See Comments)    Angry, irritable, out of skin feeling   Other Hives    Dark alcohol   Singulair [Montelukast Sodium]     Angry, irritable, out of skin feeling    Current Outpatient Medications  Medication Sig Dispense Refill   Albuterol Sulfate (PROAIR HFA IN) Inhale 1 puff into the lungs daily as needed.     amLODipine (NORVASC) 10 MG tablet Take 1 tablet (10 mg total) by mouth daily. 90 tablet 1   apixaban (ELIQUIS) 5 MG TABS tablet Take 1 tablet (5 mg total) by mouth 2 (two) times daily. 60 tablet 1   aspirin EC 81 MG tablet Take 1 tablet (81 mg total) by mouth daily. Swallow whole. 30 tablet 12   bisacodyl (DULCOLAX) 5 MG EC tablet Take 1 tablet (5 mg total) by mouth daily as needed for moderate constipation or mild constipation. 30 tablet 0   budesonide-formoterol (SYMBICORT) 160-4.5 MCG/ACT inhaler Inhale 2 puffs into the lungs 2 (two) times daily. in the morning and at bedtime.  10.2 each 11   buPROPion (WELLBUTRIN XL) 150 MG 24 hr tablet TAKE 1 TABLET BY MOUTH EVERY DAY 90 tablet 1   calcium carbonate (TUMS - DOSED IN MG ELEMENTAL CALCIUM) 500 MG chewable tablet Chew 1 tablet (200 mg of elemental calcium total) by mouth 3 (three) times daily as needed for indigestion or heartburn. 30 tablet 1   carvedilol (COREG) 12.5 MG tablet TAKE 1 TABLET (12.5 MG TOTAL) BY MOUTH IN THE MORNING, AT NOON, AND AT BEDTIME. 270 tablet 1   cyclobenzaprine (FLEXERIL) 5 MG tablet Take 1 tablet (5 mg total) by mouth 3 (three) times daily as needed for muscle spasms (migraine). 30 tablet 0   diclofenac (FLECTOR) 1.3 % PTCH Place 1 patch onto the skin 2 (two) times daily. 60 patch 1   Fremanezumab-vfrm (AJOVY) 225 MG/1.5ML SOAJ Inject 225 mg into the skin every 30 (thirty) days. Please run copay card: BIN [id number] PCN PDMI GRP [id number] ID [id number] 1.5 mL 11   hyoscyamine (LEVSIN SL) 0.125 MG SL tablet Take 2 tablets (0.25 mg total) by mouth every 4 (four) hours as needed (bladder spasm). 30 tablet 0   ondansetron (ZOFRAN-ODT) 4 MG disintegrating tablet DISSOLVE 1 TABLET ON THE TONGUE EVERY 8 HOURS AS NEEDED FOR NAUSEA AND VOMITING 90 tablet 0   rosuvastatin (CRESTOR) 10 MG tablet Take 1 tablet (10 mg total) by mouth daily. 90 tablet 1   rosuvastatin (CRESTOR) 5 MG tablet Take 1 tablet (5 mg total) by mouth daily. 90 tablet 2   SYNTHROID 50 MCG tablet Take 1 tablet (50 mcg total) by mouth daily. 90 tablet 1   triamcinolone (KENALOG) 0.1 % Apply 1 Application topically.     valACYclovir (VALTREX) 500 MG tablet TAKE 1 TABLET BY MOUTH EVERY DAY **MYLAN BRAND** (Patient taking differently: Take 500 mg by mouth daily as needed.  **MYLAN BRAND**) 90 tablet 0   No current facility-administered medications for this visit.    Review of Systems  Constitutional:  Constitutional negative. HENT: HENT negative.  Eyes: Eyes negative.  Respiratory: Respiratory negative.  Cardiovascular: Positive for leg  swelling.  GI: Positive for blood in stool.  GU: Genitourinary negative. Skin: Skin negative.  Neurological: Neurological negative. Hematologic: Positive for bruises/bleeds easily.  Psychiatric: Psychiatric negative.  Objective:  Objective   Vitals:   01/10/24 1421  BP: 123/79  Pulse: 74  Temp: 98.7 F (37.1 C)  SpO2: 97%  Weight: 153 lb (69.4 kg)  Height: 5\' 7"  (1.702 m)   Body mass index is 23.96 kg/m.  Physical Exam HENT:     Head: Normocephalic.     Nose: Nose normal.  Eyes:     Pupils: Pupils are equal, round, and reactive to light.  Cardiovascular:     Rate and Rhythm: Normal rate.  Abdominal:     General: Abdomen is flat.     Palpations: Abdomen is soft. There is no mass.  Musculoskeletal:     Cervical back: Normal range of motion and neck supple.     Right lower leg: No edema.     Left lower leg: No edema.     Comments: Compression stockings on both legs today  Skin:    Capillary Refill: Capillary refill takes less than 2 seconds.  Neurological:     General: No focal deficit present.     Mental Status: She is alert.  Psychiatric:        Mood and Affect: Mood normal.     Data: CTA IMPRESSION: 1. No acute aortic syndrome. 2. No acute abnormality in the chest, abdomen, or pelvis. 3. 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     Assessment/Plan:     48 year old female now 5 weeks out from SMA dissection which appears to be healing well with smooth narrowing leading to stenosis of the proximal SMA which seems to be asymptomatic.  She remains on Eliquis aspirin and is okay to hold Eliquis due to concern for GI bleed and need for upper and lower endoscopy.  If okay to resume Eliquis post procedure would be great to get her up to 3 months but she should also be okay on aspirin  alone if not safe for Eliquis.  I will have her follow-up again in 3 months with his enteric duplex to get a baseline.  We discussed the signs and symptoms of acute SMA related issues and she demonstrates good understanding and will call with such issues.  Sure that she will follow-up in 3 months we can discuss her anticoagulation and antiplatelet regimen moving forward based on duplex results and symptoms.  All questions answered in the presence of her mother today.  She is okay for travel from a vascular standpoint and needs to continue diligent blood pressure control.     Maeola Harman MD [facility]

## 2024-01-12 ENCOUNTER — Other Ambulatory Visit: Payer: Self-pay | Admitting: *Deleted

## 2024-01-12 DIAGNOSIS — I7779 Dissection of other artery: Secondary | ICD-10-CM

## 2024-01-12 DIAGNOSIS — K551 Chronic vascular disorders of intestine: Secondary | ICD-10-CM

## 2024-01-15 DIAGNOSIS — K625 Hemorrhage of anus and rectum: Secondary | ICD-10-CM | POA: Diagnosis not present

## 2024-01-16 DIAGNOSIS — K625 Hemorrhage of anus and rectum: Secondary | ICD-10-CM | POA: Diagnosis not present

## 2024-01-16 DIAGNOSIS — Z7901 Long term (current) use of anticoagulants: Secondary | ICD-10-CM | POA: Diagnosis not present

## 2024-01-17 ENCOUNTER — Ambulatory Visit: Payer: BC Managed Care – PPO | Admitting: Medical

## 2024-01-17 ENCOUNTER — Ambulatory Visit: Admitting: Medical

## 2024-01-17 ENCOUNTER — Telehealth: Payer: BC Managed Care – PPO | Admitting: Medical

## 2024-01-19 ENCOUNTER — Other Ambulatory Visit: Payer: Self-pay | Admitting: Medical

## 2024-01-19 ENCOUNTER — Telehealth: Payer: Self-pay | Admitting: Internal Medicine

## 2024-01-19 DIAGNOSIS — I7779 Dissection of other artery: Secondary | ICD-10-CM | POA: Diagnosis not present

## 2024-01-19 NOTE — Telephone Encounter (Signed)
 Copied from CRM [id number]. Topic: Clinical - Medication Refill >> Jan 19, 2024  4:40 PM DeAngela L wrote: Most Recent Primary Care Visit:  Provider: Ronnald Nian  Department: [facility]  Visit Type: ACUTE  Date: 01/04/2024  Medication: apixaban (ELIQUIS) 5 MG TABS tablet   Has the patient contacted their pharmacy? Yes (Agent: If no, request that the patient contact the pharmacy for the refill. If patient does not wish to contact the pharmacy document the reason why and proceed with request.) (Agent: If yes, when and what did the pharmacy advise?)  Is this the correct pharmacy for this prescription? Yes If no, delete pharmacy and type the correct one.  This is the patient's preferred pharmacy:  [id number] - [place], [place] - [address] AT [address] OF [address] [place] [address] Phone: [phone] Fax: [phone]  Has the prescription been filled recently? Yes  Is the patient out of the medication? No  Has the patient been seen for an appointment in the last year OR does the patient have an upcoming appointment? Yes  Can we respond through MyChart? Yes  Agent: Please be advised that Rx refills may take up to 3 business days. We ask that you follow-up with your pharmacy.

## 2024-01-19 NOTE — Telephone Encounter (Signed)
 Patient called requesting a transfer of care over to Dr. Leone Payor. She stated she has GI history with a provider from [place]. Requested records for review.

## 2024-01-20 ENCOUNTER — Other Ambulatory Visit: Payer: Self-pay | Admitting: Medical

## 2024-01-22 ENCOUNTER — Telehealth: Payer: Self-pay | Admitting: Internal Medicine

## 2024-01-22 ENCOUNTER — Other Ambulatory Visit: Payer: Self-pay | Admitting: Medical

## 2024-01-22 MED ORDER — APIXABAN 5 MG PO TABS: 5.0000 mg | ORAL_TABLET | Freq: Two times a day (BID) | ORAL | 2 refills | Status: DC

## 2024-01-22 NOTE — Telephone Encounter (Signed)
 Patient also saw Dr. Elnoria Howard a week ago for a Upper GI bleed, requested additional records.

## 2024-01-22 NOTE — Telephone Encounter (Signed)
 Called patient to advise we have received records. Lvm to give a call back to advise what she is needing to be seen for.

## 2024-01-22 NOTE — Telephone Encounter (Signed)
 Error

## 2024-01-24 ENCOUNTER — Ambulatory Visit (INDEPENDENT_AMBULATORY_CARE_PROVIDER_SITE_OTHER): Admitting: Medical

## 2024-01-24 VITALS — BP 110/68 | HR 80 | Wt 153.6 lb

## 2024-01-24 DIAGNOSIS — K921 Melena: Secondary | ICD-10-CM | POA: Diagnosis not present

## 2024-01-24 DIAGNOSIS — I158 Other secondary hypertension: Secondary | ICD-10-CM

## 2024-01-24 DIAGNOSIS — K551 Chronic vascular disorders of intestine: Secondary | ICD-10-CM

## 2024-01-24 DIAGNOSIS — I7779 Dissection of other artery: Secondary | ICD-10-CM | POA: Diagnosis not present

## 2024-01-24 DIAGNOSIS — K55069 Acute infarction of intestine, part and extent unspecified: Secondary | ICD-10-CM

## 2024-01-24 DIAGNOSIS — R7989 Other specified abnormal findings of blood chemistry: Secondary | ICD-10-CM

## 2024-01-24 DIAGNOSIS — R1030 Lower abdominal pain, unspecified: Secondary | ICD-10-CM

## 2024-01-24 DIAGNOSIS — Z7901 Long term (current) use of anticoagulants: Secondary | ICD-10-CM

## 2024-01-24 NOTE — Progress Notes (Unsigned)
 Subjective:  Barbara Odonnell is a 48 y.o. female who presents for Chief Complaint  Patient presents with   Medical Management of Chronic Issues    Follow-up on GI, having GI bleed by Dr. Elnoria Howard, waiting on surgery, going to General surgery April 16 for nodule/ hernia.  Going to kidney doctor due to kidney labs. Stool collection kit     Here for several concerns.  Here for concerns about GI bleeding issues.  She has had blood in the stool.  She only sees it on the stool but not liquid blood in the toilet bowl and then on the toilet paper.  She wants to have some type of study to find out where the bleed is coming from.  She has seen 2 different GI doctors recently and getting mixed messages on how to proceed.  She also checked her blood count today to make sure it is stable.  She may be having endoscopy colonoscopy but has problems with propofol.   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  She is compliant with her blood pressure medication.  Lately that has been relatively stable  She went back to the emergency department after her initial visit because of a bulge or lump in her lower abdomen.  She is concerned she may have a hernia.   [facility] consult at [facility] recently and they thought she may have hernia at the umbilicus.  They discussed possible diathesis recti above the umbilicus.   She sees kidney doctor in follow up soon for elevated creatinine.  Wants repeat creatine test today  She sees general surgery self referral 02/14/24 about the bulge in her abdomen and "hernia"  Still has LLQ pain.  Whole abdomen hurts to eat.  Had second opinion at [facility] recently regarding SMA.   Plan is medication management for now, no surgery, keep BPs low.  Has geneticist appt soon   She is seeing cardiology for consult  soon given the BP and SMA diagnosis.   Seeing gyn again soon to discuss possible changes from Nuvaring.   No other aggravating or relieving factors.    No other c/o.  Past Medical History:  Diagnosis Date   Allergy    Anemia    IV iron in past, prior to 2018   Asthma    Celiac disease    +prior biopsy   GERD (gastroesophageal reflux disease)    H/O bone density study 12/21/2017   normal   Hip fracture (HCC) 2008   right, Dr. Darrick Penna   History of stress test 2014   [facility]   Hypothyroidism    Migraine headache without aura    gets bad nausea.  does well on Naratriptan and Relpax.  Failed Imitrex, Maxalt   Stress fracture    severeral prior   Transfusion history    has had IV iron prior, Dr. Myna Hidalgo prior management   Current Outpatient Medications on File Prior to Visit  Medication Sig Dispense Refill   albuterol (VENTOLIN HFA) 108 (90 Base) MCG/ACT inhaler TAKE 2 PUFFS BY MOUTH EVERY 6 HOURS AS NEEDED FOR WHEEZE OR SHORTNESS OF BREATH 18 each 0   Albuterol Sulfate (PROAIR HFA IN) Inhale 1 puff into the lungs daily as needed.     amLODipine (NORVASC) 10 MG tablet Take 1 tablet (10 mg total) by mouth daily. 90 tablet  1   apixaban (ELIQUIS) 5 MG TABS tablet Take 1 tablet (5 mg total) by mouth 2 (two) times daily. 60 tablet 2   bisacodyl (DULCOLAX) 5 MG EC tablet Take 1 tablet (5 mg total) by mouth daily as needed for moderate constipation or mild constipation. 30 tablet 0   budesonide-formoterol (SYMBICORT) 160-4.5 MCG/ACT inhaler Inhale 2 puffs into the lungs 2 (two) times daily. in the morning and at bedtime. 10.2 each 11   buPROPion (WELLBUTRIN XL) 150 MG 24 hr tablet TAKE 1 TABLET BY MOUTH EVERY DAY 90 tablet 1   calcium carbonate (TUMS - DOSED IN MG ELEMENTAL CALCIUM) 500 MG chewable tablet Chew 1 tablet (200 mg of elemental calcium total) by mouth 3 (three) times daily as needed for indigestion or heartburn. 30 tablet 1   carvedilol (COREG) 12.5 MG tablet TAKE 1 TABLET  (12.5 MG TOTAL) BY MOUTH IN THE MORNING, AT NOON, AND AT BEDTIME. 270 tablet 1   diclofenac (FLECTOR) 1.3 % PTCH Place 1 patch onto the skin 2 (two) times daily. 60 patch 1   Fremanezumab-vfrm (AJOVY) 225 MG/1.5ML SOAJ Inject 225 mg into the skin every 30 (thirty) days. Please run copay card: BIN [id number] PCN [id number] GRP [id number] ID [id number] 1.5 mL 11   rosuvastatin (CRESTOR) 5 MG tablet Take 1 tablet (5 mg total) by mouth daily. 90 tablet 2   SYNTHROID 50 MCG tablet Take 1 tablet (50 mcg total) by mouth daily. 90 tablet 1   triamcinolone (KENALOG) 0.1 % Apply 1 Application topically.     valACYclovir (VALTREX) 500 MG tablet TAKE 1 TABLET BY MOUTH EVERY DAY **MYLAN BRAND** (Patient taking differently: Take 500 mg by mouth daily as needed.  **MYLAN BRAND**) 90 tablet 0   cyclobenzaprine (FLEXERIL) 5 MG tablet Take 1 tablet (5 mg total) by mouth 3 (three) times daily as needed for muscle spasms (migraine). (Patient not taking: Reported on 01/24/2024) 30 tablet 0   hyoscyamine (LEVSIN SL) 0.125 MG SL tablet Take 2 tablets (0.25 mg total) by mouth every 4 (four) hours as needed (bladder spasm). 30 tablet 0   ondansetron (ZOFRAN-ODT) 4 MG disintegrating tablet DISSOLVE 1 TABLET ON THE TONGUE EVERY 8 HOURS AS NEEDED FOR NAUSEA AND VOMITING (Patient not taking: Reported on 01/24/2024) 90 tablet 0   rosuvastatin (CRESTOR) 10 MG tablet Take 1 tablet (10 mg total) by mouth daily. (Patient not taking: Reported on 01/24/2024) 90 tablet 1   No current facility-administered medications on file prior to visit.     The following portions of the patient's history were reviewed and updated as appropriate: allergies, current medications, past family history, past medical history, past social history, past surgical history and problem list.  ROS Otherwise as in subjective above  Objective: BP 110/68   Pulse 80   Wt 153 lb 9.6 oz (69.7 kg)   LMP  (LMP Unknown)   BMI 24.06 kg/m   General appearance: alert, no  distress, well developed, well nourished Abdomen: There is a slight puffy soft tissue bulge in the lower abdomen just superior to the umbilicus that is about 3 cm diameter but no obvious hernia, not particularly tender.  There is an area of tenderness on the right side umbilicus but no obvious hernia.  There is a umbilical piercing.  She seems to be tender left and lower abdomen in general but no mass organomegaly Pulses: 2+ radial pulses, 2+ pedal pulses, normal cap refill Ext: no edema   CT angio chest/abdomen/pelvis 01/02/24: IMPRESSION: 1.  No acute aortic syndrome. 2. No acute abnormality in the chest, abdomen, or pelvis. 3. 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   12/01/23: CT abdomen pelvis W contrast IMPRESSION: No bowel obstruction, free air or free fluid.  Normal appendix.   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 Adjacent stranding. This has differential. Etiologies such as vasculitis, fibromuscular dysplasia or other are possible. Please correlate with any particular symptoms and further evaluation when appropriate. Further imaging options would include dedicated CTA.      Assessment: Encounter Diagnoses  Name Primary?   Superior mesenteric artery stenosis (HCC) Yes   Dissection of mesenteric artery (HCC)    Lower abdominal pain    Blood in stool    Abnormal serum creatinine level    Other secondary hypertension    Anticoagulated    Thrombosis of mesenteric artery (HCC)      Plan: 1. 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 Trixie Dredge Long from [facility]  -  Marcey is a 48 y.o. female with SMA dissection of unknown etiology. She has no symptoms of mesenteric ischemia and has had some positive remodeling since her initial scan. She does have stenosis of the SMA at the origin and dilation throughout its midsection. It appears that the patient has receiving appropriate follow-up and care at [facility], and she was encouraged to continue with that group. She was interested in genetic testing, which we will set up for her here at [facility]. Will have her follow-up in 6 months to review the results of her genetic testing. Her routine vascular follow-up will continue with Dr. Henry Russel at [facility]. Agree with continuing anticoagulation. No surgical intervention warranted at this time. If her SMA continues to grow, vascular reconstruction may be indicated.  I reviewed her consult from [facility] from 01/10/2024 with Dr. Maeola Harman- 48 year old female now 5 weeks out from SMA dissection which appears to be healing well with smooth narrowing leading to stenosis of the proximal SMA which seems to be asymptomatic. She remains on Eliquis aspirin and is okay to hold Eliquis due to concern for GI bleed and need for upper and lower endoscopy. If okay to resume Eliquis post procedure would be great to get her up to 3 months but she should also be okay on aspirin alone if not safe for Eliquis. I will have her follow-up again in 3 months with his enteric duplex to get a baseline. We discussed the signs and symptoms of acute SMA related issues and she demonstrates good understanding and will call with such issues. Sure that she will follow-up in 3 months we can discuss her anticoagulation and antiplatelet regimen moving forward based on duplex results and symptoms. All questions answered in the presence of her mother today. She is okay for travel from a vascular standpoint and needs to continue diligent blood pressure control.    2. Blood in stool - she notes  recent blood seen only in stool, not on toilet paper or liquid in bowl, no obvious hemorrhoid.   She saw 2 different GI offices.   In the meantime, updated CBC lab today and possibly monthly for  anemia surveillance.   The impression was that Gi didn't want to pursue any endoscopies unless vascular felt she was safe to have those tests done.   We will communicate with Dr. Haywood Pao office on clarification of how they want to proceed, capsule endoscopy?  Watch and wait approach with monitoring serial CBC?  Proceed with hospital based endoscopy /colonoscopy in conjunction with vascular and their recommendations?     3. Hx/o abnormal creatinine - updated labs at her request today.  She has new consult with nephrology coming up soon to discuss.    4. Secondary hypertension along with the new SMA diagnosis as of 12/2023. Continue Amlodipine 10mg  daily, Carvedilol 12.5mg  TID  5. Hyperlipidemia, without CAD on CT cardiac score test 01/03/24.  She plans to start Crestor soon, but hasn't yet started this.  6. Anticoagulated - continue on Eliquis, and she is aware of risks/benefits of medication  7. Abdominal bulge - she has 2 areas of concern, one just above the umbilicus and one area at right umbilical region.  We discussed that with her recent imaging there was no obvious hernia or worrisome mass and today on exam there is still is no obvious hernia.  I suspect the bulge above her umbilicus is a small area of diastases recti.  Advised to not focus too much on those 2 areas of concern currently given the more serious issue with the SMA.  I advised against repeat scanning at the moment to look for any type of small potential hernia  8. Lower abdominal pain -multifactorial.  She has the newer febrile 2025 SMA dissection diagnosis, she has underlying history of celiac disease, and she is working with gynecology to possibly switch to a different hormonal birth control.  Advise she continue routine follow-up as planned with  gynecology and vascular.  I reviewed back over her recent CT abdomen pelvis scan since February 2025.  The finding of colitis or diverticulitis or concerning mass or tumor   Britiany was seen today for medical management of chronic issues.  Diagnoses and all orders for this visit:  Superior mesenteric artery stenosis (HCC) -     CBC; Future  Dissection of mesenteric artery (HCC)  Lower abdominal pain  Blood in stool -     CBC -     Fecal occult blood, imunochemical -     Renal Function Panel; Future  Abnormal serum creatinine level -     Basic metabolic panel -     Renal Function Panel; Future  Other secondary hypertension  Anticoagulated -     CBC; Future  Thrombosis of mesenteric artery (HCC)  Spent > 45 minutes face to face with patient in discussion of symptoms, evaluation, plan and recommendations.    Follow up: Pending labs

## 2024-01-25 DIAGNOSIS — K55069 Acute infarction of intestine, part and extent unspecified: Secondary | ICD-10-CM | POA: Insufficient documentation

## 2024-01-25 DIAGNOSIS — R109 Unspecified abdominal pain: Secondary | ICD-10-CM | POA: Insufficient documentation

## 2024-01-25 DIAGNOSIS — K551 Chronic vascular disorders of intestine: Secondary | ICD-10-CM | POA: Insufficient documentation

## 2024-01-25 DIAGNOSIS — K921 Melena: Secondary | ICD-10-CM | POA: Insufficient documentation

## 2024-01-25 DIAGNOSIS — I158 Other secondary hypertension: Secondary | ICD-10-CM | POA: Insufficient documentation

## 2024-01-25 DIAGNOSIS — R7989 Other specified abnormal findings of blood chemistry: Secondary | ICD-10-CM | POA: Insufficient documentation

## 2024-01-25 DIAGNOSIS — R1030 Lower abdominal pain, unspecified: Secondary | ICD-10-CM | POA: Insufficient documentation

## 2024-01-25 LAB — BASIC METABOLIC PANEL WITH GFR
BUN/Creatinine Ratio: 6 — ABNORMAL LOW (ref 9–23)
BUN: 6 mg/dL (ref 6–24)
CO2: 22 mmol/L (ref 20–29)
Calcium: 9.3 mg/dL (ref 8.7–10.2)
Chloride: 103 mmol/L (ref 96–106)
Creatinine, Ser: 0.94 mg/dL (ref 0.57–1.00)
Glucose: 75 mg/dL (ref 70–99)
Potassium: 4.2 mmol/L (ref 3.5–5.2)
Sodium: 139 mmol/L (ref 134–144)
eGFR: 74 mL/min/{1.73_m2} (ref >59)

## 2024-01-25 LAB — CBC
Hematocrit: 45.2 % (ref 34.0–46.6)
Hemoglobin: 14.8 g/dL (ref 11.1–15.9)
MCH: 31.4 pg (ref 26.6–33.0)
MCHC: 32.7 g/dL (ref 31.5–35.7)
MCV: 96 fL (ref 79–97)
Platelets: 246 10*3/uL (ref 150–450)
RBC: 4.71 x10E6/uL (ref 3.77–5.28)
RDW: 12.4 % (ref 11.7–15.4)
WBC: 5.4 10*3/uL (ref 3.4–10.8)

## 2024-01-25 NOTE — Progress Notes (Signed)
 Barbara Odonnell  See my printout with highlighted area circled, and labs, and fax to Dr. Elnoria Howard.  Call their office and ask that Dr. Elnoria Howard give [place] some feedback on this request  Results sent through My Chart

## 2024-01-28 ENCOUNTER — Other Ambulatory Visit: Payer: Self-pay | Admitting: Medical

## 2024-01-28 MED ORDER — OMEPRAZOLE 40 MG PO CPDR: 40.0000 mg | DELAYED_RELEASE_CAPSULE | Freq: Every day | ORAL | 0 refills | Status: DC

## 2024-01-28 NOTE — Progress Notes (Deleted)
  Cardiology Office Note:   Date:  01/28/2024  ID:  Barbara Odonnell, DOB 07/10/1976, MRN [id number] PCP: Barbara Del  [facility] Odon[facility] Cardiologist:  None {  History of Present Illness:   Barbara Odonnell is a 48 y.o. female who is referred by Barbara Canavan, Barbara Odonnell for evaluation of palpitations.   Echo in 2019 was normal.  This was done to evaluate an abnormal EKG which suggested right atrial enlargement although this was not evident on the echo.  She reported have a stress test in 2014.  This was again done for evaluation of the abnormal EKG.    ***    ***  The patient describes palpitations as skipping beats.  She feels these occasionally during the day.  However, she is also have this tingling sensation like something is crawling in her chest or heart.  She points to the mid left upper chest.  This she notices at night.  This is not with palpitations or an irregular heartbeat.  She cannot bring this on.      The patient does have orthostatic symptoms.  She had never been diagnosed with this.  She is a PhD in kinesiology and has this sensation when she is exercising.  She feels lightheaded if she moves quickly.  This will happen with changing positions although she has not had any frank syncope.  She has not had any chest pressure, neck or arm discomfort.  Has had no new shortness of breath, PND or orthopnea.  She exercises vigorously.  ROS: ***  Studies Reviewed:    EKG:       ***  Risk Assessment/Calculations:   {Does this patient have ATRIAL FIBRILLATION?:[id number]} No BP recorded.  {Refresh Note OR Click here to enter BP  :1}***        Physical Exam:   VS:  LMP  (LMP Unknown)    Wt Readings from Last 3 Encounters:  01/24/24 153 lb 9.6 oz (69.7 kg)  01/10/24 153 lb (69.4 kg)  01/06/24 157 lb 10.1 oz (71.5 kg)     GEN: Well nourished, well developed in no acute distress NECK: No JVD; No carotid bruits CARDIAC: ***RR, *** murmurs, rubs,  gallops RESPIRATORY:  Clear to auscultation without rales, wheezing or rhonchi  ABDOMEN: Soft, non-tender, non-distended EXTREMITIES:  No edema; No deformity   ASSESSMENT AND PLAN:   PALPITATIONS: ***  The patient has a couple of different symptoms to include palpitations and tingling sensation.  To further evaluate this she will get a 2-week event monitor.  I do note that labs earlier this year to include TSH and electrolytes were normal.   ORTHOSTATIC HYPOTENSION:   ***  Her blood pressure has been was very mildly orthostatic from sitting to standing today.  However, she is probably well-hydrated.  She is aware of what she needs to do to avoid symptoms.  We talked about this again today.  No change in therapy.       Follow up ***  Signed, Rollene Rotunda, MD

## 2024-01-30 ENCOUNTER — Ambulatory Visit: Admitting: Medical

## 2024-01-30 ENCOUNTER — Encounter: Payer: Self-pay | Admitting: Vascular Surgery

## 2024-01-30 NOTE — Telephone Encounter (Signed)
 I was contacted by another provider (Dr. Denton Lank) about her  I have reviewed records  I am willing to see her  Looks like we would need to use a 7 day hold spot or a nurse visit

## 2024-01-31 ENCOUNTER — Ambulatory Visit: Admitting: Medical

## 2024-01-31 DIAGNOSIS — R072 Precordial pain: Secondary | ICD-10-CM | POA: Diagnosis not present

## 2024-01-31 DIAGNOSIS — I10 Essential (primary) hypertension: Secondary | ICD-10-CM | POA: Diagnosis not present

## 2024-01-31 DIAGNOSIS — E7800 Pure hypercholesterolemia, unspecified: Secondary | ICD-10-CM | POA: Diagnosis not present

## 2024-01-31 DIAGNOSIS — I7779 Dissection of other artery: Secondary | ICD-10-CM | POA: Diagnosis not present

## 2024-01-31 DIAGNOSIS — K55069 Acute infarction of intestine, part and extent unspecified: Secondary | ICD-10-CM | POA: Diagnosis not present

## 2024-01-31 NOTE — Telephone Encounter (Signed)
 02/14/24 at 1010 am appt with Dr Leone Payor   The pt has been advised and all questions answered to the best of my ability. Pt provided address of the office

## 2024-01-31 NOTE — Telephone Encounter (Signed)
 Inbound call from patient stating she is returning phone call regarding scheduling appointment with Dr. Leone Payor. Requesting a call back. Please advise, thank you.

## 2024-02-01 ENCOUNTER — Ambulatory Visit: Payer: BC Managed Care – PPO | Admitting: Cardiology

## 2024-02-01 DIAGNOSIS — R002 Palpitations: Secondary | ICD-10-CM

## 2024-02-01 DIAGNOSIS — I951 Orthostatic hypotension: Secondary | ICD-10-CM

## 2024-02-05 ENCOUNTER — Other Ambulatory Visit: Payer: BC Managed Care – PPO

## 2024-02-05 DIAGNOSIS — R9431 Abnormal electrocardiogram [ECG] [EKG]: Secondary | ICD-10-CM | POA: Diagnosis not present

## 2024-02-05 DIAGNOSIS — R072 Precordial pain: Secondary | ICD-10-CM | POA: Diagnosis not present

## 2024-02-06 ENCOUNTER — Ambulatory Visit (INDEPENDENT_AMBULATORY_CARE_PROVIDER_SITE_OTHER): Admitting: Sports Medicine

## 2024-02-06 VITALS — BP 120/78 | Ht 67.0 in | Wt 151.0 lb

## 2024-02-06 DIAGNOSIS — J4540 Moderate persistent asthma, uncomplicated: Secondary | ICD-10-CM | POA: Diagnosis not present

## 2024-02-06 DIAGNOSIS — M25562 Pain in left knee: Secondary | ICD-10-CM | POA: Diagnosis not present

## 2024-02-06 NOTE — Progress Notes (Signed)
 Patient returns for follow-up of left knee small capsular tear  Since being seen though she has had a very complex medical history.  This includes a superior mesenteric artery dissection and clot.  She is on Eliquis.  She also has had intermittent hypertensive surges and now is on a beta-blocker Norvasc and an ACE inhibitor.  Her history is interesting for a couple of factors and that she had a severe asthma attack requiring steroids to control repetitive coughing before the time of her clot.  She also had influenza A before the clot occurred.  She has been found to have high cholesterol but that is a problem that her mother experienced although both of them have exercised extensively and have been thin and fit.  Currently the knee is not very painful but she does notice 1 area of swelling around the Pez anserine and the resolution of some of the swelling over the peripatellar area noted before.  More significantly she has pain along her rectus abdominis midline and extending to the subxiphoid area on the right.  She now notices a bulge in this area when she sits up.  With all these changes she is being worked up for possible genetic disorder or collagen vascular disease.  Physical exam Pleasant physically fit white female in no acute distress BP 120/78   Ht 5\' 7"  (1.702 m)   Wt 151 lb (68.5 kg)   BMI 23.65 kg/m   Examination of the abdomen reveals a small defect with widening in the upper midline syndesmosis of the rectus abdominis.  This does bulge slightly with sitting up or abdominal pressure but I did not feel any herniation. There is also some tenderness and slight irregularity in the area from the midline 3 to 4 cm laterally to the right just under her rib cage.  Knee: Left Normal to inspection with no erythema or effusion or obvious bony abnormalities. Palpation normal with no warmth or joint line tenderness or patellar tenderness or condyle tenderness. The area of capsular  swelling on the lateral aspect of the patella seems to have resolved.  There is some minimal swelling over her left Pez anserine. ROM normal in flexion and extension and lower leg rotation. Ligaments with solid consistent endpoints including ACL, PCL, LCL, MCL. Negative Mcmurray's and provocative meniscal tests. Non painful patellar compression. Patellar and quadriceps tendons unremarkable. Hamstring and quadriceps strength is normal.

## 2024-02-06 NOTE — Assessment & Plan Note (Signed)
 Recent asthma attack requiring steroids could potentially have put her at more risk for a diastases recti as she is showing some changes in the midline of her rectus abdominis  I recommended resting this area and continuing walking and gentle motion exercises

## 2024-02-07 ENCOUNTER — Telehealth: Admitting: Medical

## 2024-02-08 DIAGNOSIS — I7779 Dissection of other artery: Secondary | ICD-10-CM | POA: Diagnosis not present

## 2024-02-09 ENCOUNTER — Encounter: Payer: Self-pay | Admitting: Vascular Surgery

## 2024-02-10 ENCOUNTER — Other Ambulatory Visit: Payer: Self-pay | Admitting: Medical

## 2024-02-12 ENCOUNTER — Ambulatory Visit: Payer: BC Managed Care – PPO | Admitting: Neurology

## 2024-02-12 ENCOUNTER — Telehealth: Payer: Self-pay | Admitting: *Deleted

## 2024-02-12 NOTE — Telephone Encounter (Signed)
 Copied from CRM [id number]. Topic: Clinical - Prescription Issue >> Feb 12, 2024  2:40 PM Zipporah Him wrote: Reason for CRM: Albuterol Sulfate (PROAIR HFA IN) refill denied at pharmacy by provider, per patient. She is leaving for [place]  on the 22nd and needs her inhaler refilled, please call advise if there is an issue. >> Feb 12, 2024  2:47 PM Zipporah Him wrote: Patient got a call from [facility] Routt and wanted to note, since she is going to [place]  she just wanted to have an extra.  Sent to provider for refill.

## 2024-02-13 ENCOUNTER — Other Ambulatory Visit: Payer: Self-pay | Admitting: Family Medicine

## 2024-02-13 DIAGNOSIS — K429 Umbilical hernia without obstruction or gangrene: Secondary | ICD-10-CM | POA: Diagnosis not present

## 2024-02-13 DIAGNOSIS — K439 Ventral hernia without obstruction or gangrene: Secondary | ICD-10-CM | POA: Diagnosis not present

## 2024-02-13 MED ORDER — ALBUTEROL SULFATE HFA 108 (90 BASE) MCG/ACT IN AERS: 2.0000 | INHALATION_SPRAY | Freq: Four times a day (QID) | RESPIRATORY_TRACT | 0 refills | Status: DC | PRN

## 2024-02-14 ENCOUNTER — Encounter: Payer: Self-pay | Admitting: Internal Medicine

## 2024-02-14 ENCOUNTER — Ambulatory Visit: Admitting: Internal Medicine

## 2024-02-14 VITALS — BP 106/62 | HR 76 | Ht 67.0 in | Wt 153.0 lb

## 2024-02-14 DIAGNOSIS — K900 Celiac disease: Secondary | ICD-10-CM

## 2024-02-14 DIAGNOSIS — K625 Hemorrhage of anus and rectum: Secondary | ICD-10-CM | POA: Diagnosis not present

## 2024-02-14 DIAGNOSIS — I7300 Raynaud's syndrome without gangrene: Secondary | ICD-10-CM | POA: Diagnosis not present

## 2024-02-14 DIAGNOSIS — R195 Other fecal abnormalities: Secondary | ICD-10-CM | POA: Diagnosis not present

## 2024-02-14 DIAGNOSIS — R7989 Other specified abnormal findings of blood chemistry: Secondary | ICD-10-CM | POA: Diagnosis not present

## 2024-02-14 DIAGNOSIS — I7779 Dissection of other artery: Secondary | ICD-10-CM

## 2024-02-14 DIAGNOSIS — R109 Unspecified abdominal pain: Secondary | ICD-10-CM

## 2024-02-14 DIAGNOSIS — Z789 Other specified health status: Secondary | ICD-10-CM

## 2024-02-14 DIAGNOSIS — E039 Hypothyroidism, unspecified: Secondary | ICD-10-CM | POA: Diagnosis not present

## 2024-02-14 DIAGNOSIS — R1085 Abdominal pain of multiple sites: Secondary | ICD-10-CM

## 2024-02-14 DIAGNOSIS — K8681 Exocrine pancreatic insufficiency: Secondary | ICD-10-CM

## 2024-02-14 DIAGNOSIS — Z7901 Long term (current) use of anticoagulants: Secondary | ICD-10-CM

## 2024-02-14 MED ORDER — NA SULFATE-K SULFATE-MG SULF 17.5-3.13-1.6 GM/177ML PO SOLN: 1.0000 | Freq: Once | ORAL | 0 refills | Status: AC

## 2024-02-14 NOTE — Patient Instructions (Signed)
 We have scheduled you for an EGD/Colonoscopy at [facility], we have given you separate instructions with date and time   Due to recent changes in healthcare laws, you may see the results of your imaging and laboratory studies on MyChart before your provider has had a chance to review them.  We understand that in some cases there may be results that are confusing or concerning to you. Not all laboratory results come back in the same time frame and the provider may be waiting for multiple results in order to interpret others.  Please give us  48 hours in order for your provider to thoroughly review all the results before contacting the office for clarification of your results.    _______________________________________________________  If your blood pressure at your visit was 140/90 or greater, please contact your primary care physician to follow up on this.  _______________________________________________________  If you are age 45 or older, your body mass index should be between 23-30. Your Body mass index is 23.96 kg/m. If this is out of the aforementioned range listed, please consider follow up with your Primary Care Provider.  If you are age 27 or younger, your body mass index should be between 19-25. Your Body mass index is 23.96 kg/m. If this is out of the aformentioned range listed, please consider follow up with your Primary Care Provider.   ________________________________________________________  The [facility] providers would like to encourage you to use MYCHART to communicate with providers for non-urgent requests or questions.  Due to long hold times on the telephone, sending your provider a message by [facility] may be a faster and more efficient way to get a response.  Please allow 48 business hours for a response.  Please remember that this is for non-urgent requests.  _______________________________________________________   I appreciate the  opportunity to care for you  Thank You    Efren Grapes

## 2024-02-14 NOTE — Progress Notes (Signed)
 Barbara Odonnell 48 y.o. 1976-08-19 [id number]  Assessment & Plan:   Encounter Diagnoses  Name Primary?   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  Plan for EGD and colonoscopy off her Eliquis x 2 days (Dr. Randie Heinz has already authorized this) to evaluate abdominal pain rectal bleeding and heme positive stool.  Procedure will be done at the hospital due to intolerance of fentanyl and propofol and need for BP control.  Will reassess celiac disease with duodenal biopsies.  She is not sure about serologies being abnormal in the past and says that she has not had any in years and is certain she has been gluten-free.  Some of her abdominal complaints are musculoskeletal plus or minus hernia complaints.  She will continue Levsin for the abdominal cramps.  The risks and benefits as well as alternatives of endoscopic procedure(s) have been discussed and reviewed. All questions answered. The patient agrees to proceed.  She has blood pressure parameter controls due to the SMA dissection as well to keep SBP less than 140.  Reviewed that believe does not sound like it is a major problem but does merit investigation.  Patient is admittedly anxious about her health problems and I tried to reassure her as best I could.  CC: Tysinger, Kermit Balo, PA-C Dr. Lemar Livings   Subjective:   Chief Complaint: Abdominal pain and rectal bleeding  HPI 48 year old woman with a long history of celiac disease diagnosed at [facility] years ago (2003), recent diagnosis of superior mesenteric artery dissection followed by Dr. Lemar Livings, who has had problems with episodic bright red blood on the stool and has been found to be Hemoccult positive.  She had seen Dr. Elnoria Howard for this, and she was seeking a second opinion with me and  that was against Dr. Haywood Pao practice policy so she was dismissed from that practice.  She is describing intermittent blood streaked on the stool was mentioned without straining or rectal pain or obvious hemorrhoids.  Also mucous discharge at times.  There is history of colonoscopy around 2009 or 2010,  in [place] because of hematochezia and she had some sort of self-limited colitis.  No chronic inflammatory bowel disease reported.  She has self tested and has been Hemoccult positive at times with normal colored stool and Dr. Elnoria Howard found her to be heme positive at his evaluation in the office 01/10/2024.  She has bilateral upper quadrant and left lower quadrant pains that are intermittent and jabbing but she also has some crampy abdominal pain at times.  The crampy pain is relieved with Levsin.  She had been very active doing Pilates and other workouts prior to all this and she has had to restrict that due to a need to keep blood pressure below 140.  She was very hypertensive when she presented with her SMA dissection.  That was on January 31.  She has had a second opinion for her SMA dissection at the [facility] and both there and Dr. Lemar Livings do not think surgical intervention is warranted.  It is not thought her abdominal pain is related.  She has been given the okay to hold Eliquis for colonoscopy and endoscopy.  She has seen Dr. Lubertha South at [facility] for hernias.  She has a periumbilical hernia and an epigastric hernia.  She is getting a workup for connective tissue disease with serologies through the bariatric surgeon who saw her.  She tells the impression of Dr. Jimmie Molly that she has a muscular tear in the right upper quadrant.  Celiac disease is reportedly under control although she has not been followed with serologies and I am not certain which serology of any was ever elevated.  She has a very strong family history of celiac disease also.  She has had multiple endoscopies over the years she has  been on parenteral iron in the past but is confident she is gluten-free.  The abdominal pain and symptoms she has had of only been a relatively recent development.  She has specific sedation concerns and says she cannot tolerate fentanyl or propofol.  Fentanyl caused hypotension and propofol made her feel like she was choking when she was sedated and prefers not to have that again.  There is a remote history of a colon polyp.  The patient is not certain of the pathology.  Previous recent gastroenterologist who she follows with regularly was in  [place] but I believe he is retired.  Most recent CTA on 01/02/2024 showed no significant change in the dissection of the proximal SMA with mural thrombus and false lumen which causes severe stenosis and aneurysmal dilation of the SMA distal to the study occlusion measuring 9 mm.  Last visit with Dr. Lemar Livings 01/10/2024.  Reported that it was okay to hold Eliquis for her endoscopic procedures.  He thought that the stenosis of the SMA was asymptomatic.  She is due to see him again in mid June.      Latest Ref Rng & Units 01/24/2024    2:23 PM 01/02/2024    8:46 PM 12/05/2023    3:54 AM  CBC  WBC 3.4 - 10.8 x10E3/uL 5.4  6.4  12.8   Hemoglobin 11.1 - 15.9 g/dL 16.1  09.6  04.5   Hematocrit 34.0 - 46.6 % 45.2  42.4  42.7   Platelets 150 - 450 x10E3/uL 246  257  313    Lab Results  Component Value Date   IRON 91 11/06/2023   TIBC 371 11/06/2023   FERRITIN 40 11/06/2023     Allergies  Allergen Reactions   Sulfa Antibiotics Anaphylaxis   Fentanyl Other (See Comments)    BP drop   Gluten Meal     Celiac Disease   Other Hives    Dark alcohol   Propofol     intolerance   Singulair [Montelukast Sodium]     Angry, irritable, out of skin feeling   Current Meds  Medication Sig   albuterol (VENTOLIN HFA) 108 (90 Base) MCG/ACT inhaler Inhale 2 puffs into the lungs every 6 (six) hours as needed for wheezing or shortness of breath.   Albuterol  Sulfate (PROAIR HFA IN) Inhale 1 puff into the lungs daily as needed.   amLODipine (NORVASC) 10 MG tablet Take 1 tablet (10 mg total) by mouth daily. (Patient taking differently: Take 5 mg by mouth daily.)   apixaban (ELIQUIS) 5 MG TABS tablet Take 1 tablet (5 mg total) by mouth 2 (two) times daily.   bisacodyl (DULCOLAX) 5 MG EC tablet Take 1 tablet (5 mg total) by mouth daily as needed for moderate constipation or mild constipation.   budesonide-formoterol (SYMBICORT) 160-4.5 MCG/ACT inhaler Inhale 2 puffs into the lungs 2 (two) times daily. in the morning and at bedtime.  buPROPion (WELLBUTRIN XL) 150 MG 24 hr tablet TAKE 1 TABLET BY MOUTH EVERY DAY   calcium carbonate (TUMS - DOSED IN MG ELEMENTAL CALCIUM) 500 MG chewable tablet Chew 1 tablet (200 mg of elemental calcium total) by mouth 3 (three) times daily as needed for indigestion or heartburn.   carvedilol (COREG) 12.5 MG tablet TAKE 1 TABLET (12.5 MG TOTAL) BY MOUTH IN THE MORNING, AT NOON, AND AT BEDTIME.   cyclobenzaprine (FLEXERIL) 5 MG tablet Take 1 tablet (5 mg total) by mouth 3 (three) times daily as needed for muscle spasms (migraine).   diclofenac (FLECTOR) 1.3 % PTCH Place 1 patch onto the skin 2 (two) times daily. (Patient taking differently: Place 1 patch onto the skin as needed.)   Fremanezumab-vfrm (AJOVY) 225 MG/1.5ML SOAJ Inject 225 mg into the skin every 30 (thirty) days. Please run copay card: BIN [id number] PCN PDMI GRP [id number] ID [id number]   hyoscyamine (LEVSIN SL) 0.125 MG SL tablet Take 2 tablets (0.25 mg total) by mouth every 4 (four) hours as needed (bladder spasm).   lisinopril (ZESTRIL) 5 MG tablet Take 5 mg by mouth daily.   omeprazole (PRILOSEC) 40 MG capsule Take 1 capsule (40 mg total) by mouth daily.   ondansetron (ZOFRAN-ODT) 4 MG disintegrating tablet DISSOLVE 1 TABLET ON THE TONGUE EVERY 8 HOURS AS NEEDED FOR NAUSEA AND VOMITING   rosuvastatin (CRESTOR) 5 MG tablet Take 1 tablet (5 mg total) by mouth daily.    SYNTHROID 50 MCG tablet Take 1 tablet (50 mcg total) by mouth daily.   valACYclovir (VALTREX) 500 MG tablet TAKE 1 TABLET BY MOUTH EVERY DAY **MYLAN BRAND** (Patient taking differently: Take 500 mg by mouth daily as needed.  **MYLAN BRAND**)   [DISCONTINUED] rosuvastatin (CRESTOR) 10 MG tablet Take 1 tablet (10 mg total) by mouth daily.   [DISCONTINUED] triamcinolone (KENALOG) 0.1 % Apply 1 Application topically.   Past Medical History:  Diagnosis Date   Allergy    Anemia    IV iron in past, prior to 2018   Asthma    Celiac disease    +prior biopsy   GERD (gastroesophageal reflux disease)    H/O bone density study 12/21/2017   normal   Hip fracture (HCC) 2008   right, Dr. Darrick Penna   History of stress test 2014   [facility]   Hypothyroidism    Migraine headache without aura    gets bad nausea.  does well on Naratriptan and Relpax.  Failed Imitrex, Maxalt   Osteopenia    Stress fracture    severeral prior   Transfusion history    has had IV iron prior, Dr. 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   Prior worked as professor at [employer].  Exercise:- 6-7 days per week, teaches spin at the  [employer], swam competitively in college, marathon runner.       [facility]:   Originally from [place]. Has also lived in [place], [place], & [place].  Currently working in counseling. No pets currently. No bird exposure. No mold or hot tub  exposure. Currently has carpet in the bedroom. No draperies. No exposure to asbestos.      family history includes COPD in her maternal grandmother; Cancer in her father; Celiac disease in her brother, brother, and mother; Heart disease in her maternal grandfather and paternal grandfather; Heart disease (age of onset: 58) in her father; Hypertension in her mother; Osteopenia in her maternal grandmother and mother; Pulmonary embolism in her father; Raynaud syndrome in her mother; Stroke in her maternal grandfather and maternal grandmother.   Review of Systems See HPI Allergy and sinus problems headache fatigue some cough.  She had pedal edema from Norvasc and she has some urinary leakage at times.  Overall review of systems are negative.  Objective:   Physical Exam @BP  106/62   Pulse 76   Ht 5\' 7"  (1.702 m)   Wt 153 lb (69.4 kg)   BMI 23.96 kg/m @  General:  Well-developed, well-nourished and in no acute distress Eyes:  anicteric. ENT:   Mouth and posterior pharynx free of lesions.  Neck:   supple w/o thyromegaly or mass.  Lungs: Clear to auscultation bilaterally. Heart:   S1S2, no rubs, murmurs, gallops. Abdomen:  soft, thin/muscular, no hepatosplenomegaly, hernia, or mass and BS+.  There is a very small defect palpable to the right of the umbilicus consistent with a hernia and 1 in the epigastric area.  These are best appreciated when she is standing.  The right upper quadrant is tender and there is a positive Carnett's sign there there is an equivocal Carnett's sign in the left lower quadrant with palpation as she is tender there also.  Right upper quadrant tenderness is more pronounced, left lower quadrant not so much.  No masses are palpated other than the hernia defects noted above. Rectal: deferred Lymph:  no cervical or supraclavicular adenopathy. Extremities:   no edema, cyanosis or clubbing Skin   no rash in areas inspected-trunk upper extremities face neck  Neuro:  A&O x 3.   Psych:  appropriate mood and  Affect.   Data Reviewed: See HPI.  Extensive records reviewed including Dr. Cleora Daft workup, PCP notes, vascular surgery notes, ED visit notes and imaging and labs in the computer as well.

## 2024-02-17 DIAGNOSIS — S35239D Unspecified injury of inferior mesenteric artery, subsequent encounter: Secondary | ICD-10-CM | POA: Diagnosis not present

## 2024-02-19 ENCOUNTER — Other Ambulatory Visit

## 2024-02-20 ENCOUNTER — Ambulatory Visit: Payer: BC Managed Care – PPO | Admitting: Neurology

## 2024-02-21 ENCOUNTER — Telehealth: Payer: Self-pay | Admitting: Medical

## 2024-02-21 ENCOUNTER — Other Ambulatory Visit

## 2024-02-21 DIAGNOSIS — Z7901 Long term (current) use of anticoagulants: Secondary | ICD-10-CM | POA: Diagnosis not present

## 2024-02-21 DIAGNOSIS — R7989 Other specified abnormal findings of blood chemistry: Secondary | ICD-10-CM | POA: Diagnosis not present

## 2024-02-21 DIAGNOSIS — K921 Melena: Secondary | ICD-10-CM

## 2024-02-21 DIAGNOSIS — K551 Chronic vascular disorders of intestine: Secondary | ICD-10-CM

## 2024-02-21 DIAGNOSIS — I10 Essential (primary) hypertension: Secondary | ICD-10-CM

## 2024-02-21 DIAGNOSIS — Z79899 Other long term (current) drug therapy: Secondary | ICD-10-CM

## 2024-02-21 DIAGNOSIS — E7800 Pure hypercholesterolemia, unspecified: Secondary | ICD-10-CM

## 2024-02-21 NOTE — Telephone Encounter (Signed)
 Can you put in future orders for her cbc & renal next month ( she already has orders for lipid stuff)   And if you are able to put in her future monthly cbc & renal panels, she has already scheduled these thru September

## 2024-02-21 NOTE — Telephone Encounter (Signed)
 Done.  Pt is having surgery in June. I will await to put in future ordered after June

## 2024-02-22 LAB — RENAL FUNCTION PANEL
Albumin: 4.1 g/dL (ref 3.9–4.9)
BUN/Creatinine Ratio: 7 — ABNORMAL LOW (ref 9–23)
BUN: 7 mg/dL (ref 6–24)
CO2: 24 mmol/L (ref 20–29)
Calcium: 9.8 mg/dL (ref 8.7–10.2)
Chloride: 101 mmol/L (ref 96–106)
Creatinine, Ser: 0.98 mg/dL (ref 0.57–1.00)
Glucose: 77 mg/dL (ref 70–99)
Phosphorus: 3.3 mg/dL (ref 3.0–4.3)
Potassium: 4.5 mmol/L (ref 3.5–5.2)
Sodium: 138 mmol/L (ref 134–144)
eGFR: 70 mL/min/{1.73_m2} (ref >59)

## 2024-02-22 LAB — CBC
Hematocrit: 46.1 % (ref 34.0–46.6)
Hemoglobin: 15.1 g/dL (ref 11.1–15.9)
MCH: 32.8 pg (ref 26.6–33.0)
MCHC: 32.8 g/dL (ref 31.5–35.7)
MCV: 100 fL — ABNORMAL HIGH (ref 79–97)
Platelets: 247 10*3/uL (ref 150–450)
RBC: 4.6 x10E6/uL (ref 3.77–5.28)
RDW: 12.1 % (ref 11.7–15.4)
WBC: 5.7 10*3/uL (ref 3.4–10.8)

## 2024-02-22 NOTE — Progress Notes (Signed)
 Results sent through MyChart

## 2024-02-28 ENCOUNTER — Other Ambulatory Visit: Payer: Self-pay | Admitting: Medical

## 2024-03-11 ENCOUNTER — Other Ambulatory Visit

## 2024-03-11 ENCOUNTER — Encounter: Payer: Self-pay | Admitting: Internal Medicine

## 2024-03-11 DIAGNOSIS — E7800 Pure hypercholesterolemia, unspecified: Secondary | ICD-10-CM

## 2024-03-13 ENCOUNTER — Telehealth: Admitting: Medical

## 2024-03-15 ENCOUNTER — Other Ambulatory Visit

## 2024-03-20 ENCOUNTER — Other Ambulatory Visit

## 2024-03-27 DIAGNOSIS — Z1589 Genetic susceptibility to other disease: Secondary | ICD-10-CM | POA: Diagnosis not present

## 2024-03-27 DIAGNOSIS — I7779 Dissection of other artery: Secondary | ICD-10-CM | POA: Diagnosis not present

## 2024-03-27 NOTE — Progress Notes (Deleted)
 Cardiology Office Note   Date:  03/27/2024   ID:  Barbara Odonnell, DOB [birth year], MRN [id number]  PCP:  Claudene Crystal, PA-C  Cardiologist:   None Referring:  ***  No chief complaint on file.     History of Present Illness: Barbara Odonnell is a 48 y.o. female who I saw in August 2022 for evaluation of palpitations.  She had had an echo in 2019 that was normal.  Monitor in 2022 demonstrated no significant arrhythmias.  She had a few runs of SVT.   She had a coronary calcium  score that was zero. ***   ***   presents for who is referred by Claudene Crystal, PA-C for evaluation of palpitations.   The patient describes palpitations as skipping beats.  She feels these occasionally during the day.  However, she is also have this tingling sensation like something is crawling in her chest or heart.  She points to the mid left upper chest.  This she notices at night.  This is not with palpitations or an irregular heartbeat.  She cannot bring this on.   Echo in 2019 was normal.  This was done to evaluate an abnormal EKG which suggested right atrial enlargement although this was not evident on the echo.  She reported have a stress test in 2014.  This was again done for evaluation of the abnormal EKG.   The patient does have orthostatic symptoms.  She had never been diagnosed with this.  She is a PhD in kinesiology and has this sensation when she is exercising.  She feels lightheaded if she moves quickly.  This will happen with changing positions although she has not had any frank syncope.  She has not had any chest pressure, neck or arm discomfort.  Has had no new shortness of breath, PND or orthopnea.  She exercises vigorously.       Past Medical History:  Diagnosis Date   Allergy     Anemia    IV iron in past, prior to 2018   Asthma    Celiac disease    +prior biopsy   GERD (gastroesophageal reflux disease)    H/O bone density study 12/21/2017   normal   Hip fracture (HCC) 2008    right, Dr. Nolene Baumgarten   History of stress test 2014   [facility]   Hypothyroidism    Migraine headache without aura    gets bad nausea.  does well on Naratriptan  and Relpax.  Failed Imitrex , Maxalt    Osteopenia    Stress fracture    severeral prior   Transfusion history    has had IV iron prior, Dr. Maria Shiner prior management   Ulcer    previous ulcer with benign polyp removed    Past Surgical History:  Procedure Laterality Date   COLONOSCOPY  2015   [facility], advised to repeat q5 years; x 4 prior   COSMETIC SURGERY     nasal   ESOPHAGOGASTRODUODENOSCOPY     x5 prior as of 2016   INGUINAL HERNIA REPAIR     left   NASAL SEPTUM SURGERY     POLYPECTOMY     sigmoid colon   SINOSCOPY     WISDOM TOOTH EXTRACTION       Current Outpatient Medications  Medication Sig Dispense Refill   albuterol  (VENTOLIN  HFA) 108 (90 Base) MCG/ACT inhaler Inhale 2 puffs into the lungs every 6 (six) hours as needed for wheezing or shortness of breath. 18 each  0   Albuterol  Sulfate (PROAIR  HFA IN) Inhale 1 puff into the lungs daily as needed.     amLODipine  (NORVASC ) 10 MG tablet Take 1 tablet (10 mg total) by mouth daily. (Patient taking differently: Take 5 mg by mouth daily.) 90 tablet 1   apixaban  (ELIQUIS ) 5 MG TABS tablet Take 1 tablet (5 mg total) by mouth 2 (two) times daily. 60 tablet 2   bisacodyl  (DULCOLAX) 5 MG EC tablet Take 1 tablet (5 mg total) by mouth daily as needed for moderate constipation or mild constipation. 30 tablet 0   budesonide -formoterol  (SYMBICORT ) 160-4.5 MCG/ACT inhaler Inhale 2 puffs into the lungs 2 (two) times daily. in the morning and at bedtime. 10.2 each 11   buPROPion  (WELLBUTRIN  XL) 150 MG 24 hr tablet TAKE 1 TABLET BY MOUTH EVERY DAY 90 tablet 1   calcium  carbonate (TUMS - DOSED IN MG ELEMENTAL CALCIUM ) 500 MG chewable tablet Chew 1 tablet (200 mg of elemental calcium  total) by mouth 3 (three) times daily as needed for indigestion or heartburn. 30 tablet 1    carvedilol  (COREG ) 12.5 MG tablet TAKE 1 TABLET (12.5 MG TOTAL) BY MOUTH IN THE MORNING, AT NOON, AND AT BEDTIME. 270 tablet 1   cyclobenzaprine  (FLEXERIL ) 5 MG tablet Take 1 tablet (5 mg total) by mouth 3 (three) times daily as needed for muscle spasms (migraine). 30 tablet 0   diclofenac  (FLECTOR ) 1.3 % PTCH Place 1 patch onto the skin 2 (two) times daily. (Patient taking differently: Place 1 patch onto the skin as needed.) 60 patch 1   Fremanezumab -vfrm (AJOVY ) 225 MG/1.5ML SOAJ Inject 225 mg into the skin every 30 (thirty) days. Please run copay card: BIN [id number] PCN PDMI GRP [id number] ID [id number] 1.5 mL 11   hyoscyamine  (LEVSIN SL) 0.125 MG SL tablet Take 2 tablets (0.25 mg total) by mouth every 4 (four) hours as needed (bladder spasm). 30 tablet 0   lisinopril (ZESTRIL) 5 MG tablet Take 5 mg by mouth daily.     omeprazole  (PRILOSEC) 40 MG capsule TAKE 1 CAPSULE (40 MG TOTAL) BY MOUTH DAILY. 30 capsule 1   ondansetron  (ZOFRAN -ODT) 4 MG disintegrating tablet DISSOLVE 1 TABLET ON THE TONGUE EVERY 8 HOURS AS NEEDED FOR NAUSEA AND VOMITING 90 tablet 0   rosuvastatin  (CRESTOR ) 5 MG tablet Take 1 tablet (5 mg total) by mouth daily. 90 tablet 2   SYNTHROID  50 MCG tablet Take 1 tablet (50 mcg total) by mouth daily. 90 tablet 1   valACYclovir  (VALTREX ) 500 MG tablet TAKE 1 TABLET BY MOUTH EVERY DAY **MYLAN BRAND** (Patient taking differently: Take 500 mg by mouth daily as needed.  **MYLAN BRAND**) 90 tablet 0   No current facility-administered medications for this visit.    Allergies:   Sulfa antibiotics, Fentanyl , Gluten meal, Other, Propofol, and Singulair  [montelukast  sodium]    Social History:  The patient  reports that she has never smoked. She has never used smokeless tobacco. She reports current alcohol use of about 2.0 standard drinks of alcohol per week. She reports that she does not use drugs.   Family History:  The patient's ***family history includes COPD in her maternal grandmother;  Cancer in her father; Celiac disease in her brother, brother, and mother; Heart disease in her maternal grandfather and paternal grandfather; Heart disease (age of onset: 53) in her father; Hypertension in her mother; Osteopenia in her maternal grandmother and mother; Pulmonary embolism in her father; Raynaud syndrome in her mother; Stroke in her maternal grandfather  and maternal grandmother.    ROS:  Please see the history of present illness.   Otherwise, review of systems are positive for {NONE DEFAULTED:18576}.   All other systems are reviewed and negative.    PHYSICAL EXAM: VS:  There were no vitals taken for this visit. , BMI There is no height or weight on file to calculate BMI. GENERAL:  Well appearing HEENT:  Pupils equal round and reactive, fundi not visualized, oral mucosa unremarkable NECK:  No jugular venous distention, waveform within normal limits, carotid upstroke brisk and symmetric, no bruits, no thyromegaly LYMPHATICS:  No cervical, inguinal adenopathy LUNGS:  Clear to auscultation bilaterally BACK:  No CVA tenderness CHEST:  Unremarkable HEART:  PMI not displaced or sustained,S1 and S2 within normal limits, no S3, no S4, no clicks, no rubs, *** murmurs ABD:  Flat, positive bowel sounds normal in frequency in pitch, no bruits, no rebound, no guarding, no midline pulsatile mass, no hepatomegaly, no splenomegaly EXT:  2 plus pulses throughout, no edema, no cyanosis no clubbing SKIN:  No rashes no nodules NEURO:  Cranial nerves II through XII grossly intact, motor grossly intact throughout PSYCH:  Cognitively intact, oriented to person place and time    EKG:        Recent Labs: 11/06/2023: TSH 1.670 12/03/2023: Magnesium 2.3 01/02/2024: ALT 11 02/21/2024: BUN 7; Creatinine, Ser 0.98; Hemoglobin 15.1; Platelets 247; Potassium 4.5; Sodium 138    Lipid Panel    Component Value Date/Time   CHOL 231 (H) 12/18/2023 0806   TRIG 95 12/18/2023 0806   HDL 62 12/18/2023 0806    CHOLHDL 3.7 12/18/2023 0806   CHOLHDL 2.8 10/08/2015 0001   VLDL 12 10/08/2015 0001   LDLCALC 152 (H) 12/18/2023 0806   LDLDIRECT 136 (H) 09/15/2020 0937      Wt Readings from Last 3 Encounters:  02/14/24 153 lb (69.4 kg)  02/06/24 151 lb (68.5 kg)  01/24/24 153 lb 9.6 oz (69.7 kg)      Other studies Reviewed: Additional studies/ records that were reviewed today include: ***. Review of the above records demonstrates:  Please see elsewhere in the note.  ***   ASSESSMENT AND PLAN:  SVT:  ***   Current medicines are reviewed at length with the patient today.  The patient {ACTIONS; HAS/DOES NOT HAVE:19233} concerns regarding medicines.  The following changes have been made:  {PLAN; NO CHANGE:13088:s}  Labs/ tests ordered today include: *** No orders of the defined types were placed in this encounter.    Disposition:   FU with ***    Signed, Eilleen Grates, MD  03/27/2024 4:51 PM    [facility]

## 2024-03-29 ENCOUNTER — Ambulatory Visit: Admitting: Cardiology

## 2024-03-29 DIAGNOSIS — R002 Palpitations: Secondary | ICD-10-CM

## 2024-04-01 DIAGNOSIS — K551 Chronic vascular disorders of intestine: Secondary | ICD-10-CM | POA: Diagnosis not present

## 2024-04-01 DIAGNOSIS — R195 Other fecal abnormalities: Secondary | ICD-10-CM | POA: Diagnosis not present

## 2024-04-01 DIAGNOSIS — Z79899 Other long term (current) drug therapy: Secondary | ICD-10-CM | POA: Diagnosis not present

## 2024-04-01 DIAGNOSIS — K921 Melena: Secondary | ICD-10-CM | POA: Diagnosis not present

## 2024-04-01 DIAGNOSIS — E7800 Pure hypercholesterolemia, unspecified: Secondary | ICD-10-CM | POA: Diagnosis not present

## 2024-04-01 DIAGNOSIS — R5382 Chronic fatigue, unspecified: Secondary | ICD-10-CM | POA: Diagnosis not present

## 2024-04-01 DIAGNOSIS — I10 Essential (primary) hypertension: Secondary | ICD-10-CM | POA: Diagnosis not present

## 2024-04-01 DIAGNOSIS — M545 Low back pain, unspecified: Secondary | ICD-10-CM | POA: Diagnosis not present

## 2024-04-01 DIAGNOSIS — R110 Nausea: Secondary | ICD-10-CM | POA: Diagnosis not present

## 2024-04-01 DIAGNOSIS — R1032 Left lower quadrant pain: Secondary | ICD-10-CM | POA: Diagnosis not present

## 2024-04-01 DIAGNOSIS — R7989 Other specified abnormal findings of blood chemistry: Secondary | ICD-10-CM | POA: Diagnosis not present

## 2024-04-02 ENCOUNTER — Other Ambulatory Visit: Payer: Self-pay | Admitting: Medical

## 2024-04-02 LAB — GI PROFILE, STOOL, PCR

## 2024-04-02 LAB — RENAL FUNCTION PANEL
Albumin: 4.2 g/dL (ref 3.9–4.9)
BUN/Creatinine Ratio: 9 (ref 9–23)
BUN: 9 mg/dL (ref 6–24)
CO2: 21 mmol/L (ref 20–29)
Calcium: 9.7 mg/dL (ref 8.7–10.2)
Chloride: 102 mmol/L (ref 96–106)
Creatinine, Ser: 0.95 mg/dL (ref 0.57–1.00)
Glucose: 85 mg/dL (ref 70–99)
Phosphorus: 3.2 mg/dL (ref 3.0–4.3)
Potassium: 4.6 mmol/L (ref 3.5–5.2)
Sodium: 138 mmol/L (ref 134–144)
eGFR: 73 mL/min/{1.73_m2} (ref >59)

## 2024-04-02 LAB — APOLIPOPROTEIN A-1: Apolipoprotein A-1: 156 mg/dL (ref 116–209)

## 2024-04-02 LAB — CBC WITH DIFFERENTIAL/PLATELET
Basophils Absolute: 0.1 10*3/uL (ref 0.0–0.2)
Basos: 2 %
EOS (ABSOLUTE): 0.4 10*3/uL (ref 0.0–0.4)
Eos: 9 %
Hematocrit: 45.6 % (ref 34.0–46.6)
Hemoglobin: 15.3 g/dL (ref 11.1–15.9)
Immature Grans (Abs): 0 10*3/uL (ref 0.0–0.1)
Immature Granulocytes: 0 %
Lymphocytes Absolute: 1.6 10*3/uL (ref 0.7–3.1)
Lymphs: 36 %
MCH: 32.8 pg (ref 26.6–33.0)
MCHC: 33.6 g/dL (ref 31.5–35.7)
MCV: 98 fL — ABNORMAL HIGH (ref 79–97)
Monocytes Absolute: 0.3 10*3/uL (ref 0.1–0.9)
Monocytes: 8 %
Neutrophils Absolute: 2 10*3/uL (ref 1.4–7.0)
Neutrophils: 45 %
Platelets: 266 10*3/uL (ref 150–450)
RBC: 4.67 x10E6/uL (ref 3.77–5.28)
RDW: 11.8 % (ref 11.7–15.4)
WBC: 4.4 10*3/uL (ref 3.4–10.8)

## 2024-04-02 LAB — HOMOCYSTEINE: Homocysteine: 14 umol/L (ref 0.0–14.5)

## 2024-04-02 LAB — ALT: ALT: 11 IU/L (ref 0–32)

## 2024-04-02 LAB — NMR, LIPOPROFILE

## 2024-04-02 LAB — APOLIPOPROTEIN B: Apolipoprotein B: 111 mg/dL — ABNORMAL HIGH (ref <90)

## 2024-04-03 DIAGNOSIS — E7800 Pure hypercholesterolemia, unspecified: Secondary | ICD-10-CM | POA: Diagnosis not present

## 2024-04-03 LAB — CLOSTRIDIUM DIFFICILE EIA: C difficile Toxins A+B, EIA: NEGATIVE

## 2024-04-04 ENCOUNTER — Ambulatory Visit (INDEPENDENT_AMBULATORY_CARE_PROVIDER_SITE_OTHER): Admitting: Sports Medicine

## 2024-04-04 ENCOUNTER — Other Ambulatory Visit: Payer: Self-pay

## 2024-04-04 VITALS — BP 110/78 | Ht 66.0 in | Wt 150.0 lb

## 2024-04-04 DIAGNOSIS — R1010 Upper abdominal pain, unspecified: Secondary | ICD-10-CM | POA: Diagnosis not present

## 2024-04-04 LAB — NMR, LIPOPROFILE
Cholesterol, Total: 210 mg/dL — ABNORMAL HIGH (ref 100–199)
HDL Particle Number: 34 umol/L (ref >=30.5)
HDL-C: 53 mg/dL (ref >39)
LDL Particle Number: 1639 nmol/L — ABNORMAL HIGH (ref <1000)
LDL Size: 21.8 nm (ref >20.5)
LDL-C (NIH Calc): 139 mg/dL — ABNORMAL HIGH (ref 0–99)
LP-IR Score: 43 (ref <=45)
Small LDL Particle Number: 369 nmol/L (ref <=527)
Triglycerides: 101 mg/dL (ref 0–149)

## 2024-04-04 NOTE — Progress Notes (Signed)
 PCP: Claudene Crystal, PA-C  Chief Complaint: F/u abd pain  Subjective:   HPI: Patient is a 48 y.o. female here for Abdominal pain has been going on for the past few months now.  Patient notes that she has a possible hernia of the epigastric area and has been seeing a hernia specialist for that.  Patient still continues to have pain in the rectus abdominis superior aspect near the xiphoid area midline.  Pain is more pronounced on the right than left.  Patient also also has other GI issues going on is having a full GI workup and has also had some connective tissue disorder workup.  Patient notes that the pain is still present and is more prominent in the superior aspect of the abdomen whenever she is pressing on it or stretching it..   Past Medical History:  Diagnosis Date   Allergy     Anemia    IV iron in past, prior to 2018   Asthma    Celiac disease    +prior biopsy   GERD (gastroesophageal reflux disease)    H/O bone density study 12/21/2017   normal   Hip fracture (HCC) 2008   right, Dr. Nolene Baumgarten   History of stress test 2014   [facility]   Hypothyroidism    Migraine headache without aura    gets bad nausea.  does well on Naratriptan  and Relpax.  Failed Imitrex , Maxalt    Osteopenia    Stress fracture    severeral prior   Transfusion history    has had IV iron prior, Dr. Maria Shiner prior management   Ulcer    previous ulcer with benign polyp removed    Current Outpatient Medications on File Prior to Visit  Medication Sig Dispense Refill   albuterol  (VENTOLIN  HFA) 108 (90 Base) MCG/ACT inhaler Inhale 2 puffs into the lungs every 6 (six) hours as needed for wheezing or shortness of breath. 18 each 0   Albuterol  Sulfate (PROAIR  HFA IN) Inhale 1 puff into the lungs daily as needed.     amLODipine  (NORVASC ) 10 MG tablet Take 1 tablet (10 mg total) by mouth daily. (Patient taking differently: Take 5 mg by mouth daily.) 90 tablet 1   apixaban  (ELIQUIS ) 5 MG TABS tablet Take 1  tablet (5 mg total) by mouth 2 (two) times daily. 60 tablet 2   bisacodyl  (DULCOLAX) 5 MG EC tablet Take 1 tablet (5 mg total) by mouth daily as needed for moderate constipation or mild constipation. 30 tablet 0   budesonide -formoterol  (SYMBICORT ) 160-4.5 MCG/ACT inhaler Inhale 2 puffs into the lungs 2 (two) times daily. in the morning and at bedtime. 10.2 each 11   buPROPion  (WELLBUTRIN  XL) 150 MG 24 hr tablet TAKE 1 TABLET BY MOUTH EVERY DAY 90 tablet 1   calcium  carbonate (TUMS - DOSED IN MG ELEMENTAL CALCIUM ) 500 MG chewable tablet Chew 1 tablet (200 mg of elemental calcium  total) by mouth 3 (three) times daily as needed for indigestion or heartburn. 30 tablet 1   carvedilol  (COREG ) 12.5 MG tablet TAKE 1 TABLET (12.5 MG TOTAL) BY MOUTH IN THE MORNING, AT NOON, AND AT BEDTIME. 270 tablet 1   cyclobenzaprine  (FLEXERIL ) 5 MG tablet Take 1 tablet (5 mg total) by mouth 3 (three) times daily as needed for muscle spasms (migraine). 30 tablet 0   diclofenac  (FLECTOR ) 1.3 % PTCH Place 1 patch onto the skin 2 (two) times daily. (Patient taking differently: Place 1 patch onto the skin as needed.) 60 patch 1  Fremanezumab -vfrm (AJOVY ) 225 MG/1.5ML SOAJ Inject 225 mg into the skin every 30 (thirty) days. Please run copay card: BIN [id number] PCN PDMI GRP [id number] ID [id number] 1.5 mL 11   hyoscyamine  (LEVSIN SL) 0.125 MG SL tablet Take 2 tablets (0.25 mg total) by mouth every 4 (four) hours as needed (bladder spasm). 30 tablet 0   lisinopril (ZESTRIL) 5 MG tablet Take 5 mg by mouth daily.     omeprazole  (PRILOSEC) 40 MG capsule TAKE 1 CAPSULE (40 MG TOTAL) BY MOUTH DAILY. 30 capsule 1   ondansetron  (ZOFRAN -ODT) 4 MG disintegrating tablet DISSOLVE 1 TABLET ON THE TONGUE EVERY 8 HOURS AS NEEDED FOR NAUSEA AND VOMITING 90 tablet 0   rosuvastatin  (CRESTOR ) 5 MG tablet Take 1 tablet (5 mg total) by mouth daily. 90 tablet 2   SYNTHROID  50 MCG tablet Take 1 tablet (50 mcg total) by mouth daily. 90 tablet 1    valACYclovir  (VALTREX ) 500 MG tablet TAKE 1 TABLET BY MOUTH EVERY DAY **MYLAN BRAND** (Patient taking differently: Take 500 mg by mouth daily as needed.  **MYLAN BRAND**) 90 tablet 0   No current facility-administered medications on file prior to visit.    Past Surgical History:  Procedure Laterality Date   COLONOSCOPY  2015   [facility], advised to repeat q5 years; x 4 prior   COSMETIC SURGERY     nasal   ESOPHAGOGASTRODUODENOSCOPY     x5 prior as of 2016   INGUINAL HERNIA REPAIR     left   NASAL SEPTUM SURGERY     POLYPECTOMY     sigmoid colon   SINOSCOPY     WISDOM TOOTH EXTRACTION      Allergies  Allergen Reactions   Sulfa Antibiotics Anaphylaxis   Fentanyl  Other (See Comments)    BP drop   Gluten Meal     Celiac Disease   Other Hives    Dark alcohol   Propofol     intolerance   Singulair  [Montelukast  Sodium]     Angry, irritable, out of skin feeling    BP 110/78   Ht 5\' 6"  (1.676 m)   Wt 150 lb (68 kg)   BMI 24.21 kg/m       No data to display              No data to display              Objective:  Physical Exam:  Gen: NAD, comfortable in exam room  Inspection reveals a mild bulge of the midline rectus abdominis in the epigastric area.  There is tenderness to palpation over this area as well as over the superior aspect near the subxiphoid area, more pronounced tenderness on the right than left.  U/S: Abdominal Ultrasound shows a thickened rectus abdominis tendon at the superior aspect on the right side near the subxiphoid area.  Hyperechoic changes as well as thickening noted consistent with scar tissue.  There is similar but smaller findings noted on the left side at the similar point.  Hyperechoic changes as well as some very mild thickening consistent with scar tissue.  The epigastric midline area does also show a hypoechoic area between 2 fascial planes as well as a gap that could be consistent with a hernia.  Impression: Thickened rectus  abdominus noted on his tendon on the superior aspect right side near the subxiphoid area consistent with previous injury and scar tissue.  Mild thickening of the left side with some scar tissue.  Possible  epigastric hernia.    Ultrasound and interpretation by Jude Norton MD and Nathalie Baize. Fields, MD  Assessment & Plan:  1. 1. Pain of upper abdomen (Primary) - Ultrasound findings are consistent with possible previous abdominal tendon injury with thickening of the tendon on the right side as well as some scar tissue.  Discussed with patient that at this time, would be advisable for her to continue her collagen vascular workup and GI workup.  If vascular surgeon is okay with patient receiving extracorporeal shockwave therapy, can go ahead and do shockwave therapy over the thickened tendon on the right rectus abdominus subxiphoid area.  Also advised patient to limit how much abdominal pressure and exercises she is doing as well as to limit the full range of motion of the abdomen with extension.  Patient follow-up for shockwave therapy if applicable in the near future - US  LIMITED JOINT SPACE STRUCTURES UP RIGHT; Future    Jude Norton MD, PGY-4  Sports Medicine Fellow [facility]

## 2024-04-08 DIAGNOSIS — I10 Essential (primary) hypertension: Secondary | ICD-10-CM | POA: Diagnosis not present

## 2024-04-08 DIAGNOSIS — K55069 Acute infarction of intestine, part and extent unspecified: Secondary | ICD-10-CM | POA: Diagnosis not present

## 2024-04-08 DIAGNOSIS — I7779 Dissection of other artery: Secondary | ICD-10-CM | POA: Diagnosis not present

## 2024-04-08 DIAGNOSIS — E7800 Pure hypercholesterolemia, unspecified: Secondary | ICD-10-CM | POA: Diagnosis not present

## 2024-04-09 ENCOUNTER — Ambulatory Visit: Payer: Self-pay | Admitting: Medical

## 2024-04-09 NOTE — Progress Notes (Signed)
 Results sent through MyChart

## 2024-04-10 ENCOUNTER — Encounter: Payer: Self-pay | Admitting: Vascular Surgery

## 2024-04-10 ENCOUNTER — Ambulatory Visit (HOSPITAL_COMMUNITY)
Admission: RE | Admit: 2024-04-10 | Discharge: 2024-04-10 | Disposition: A | Discharge status: Home / Self Care | Source: Ambulatory Visit | Attending: Vascular Surgery | Admitting: Vascular Surgery

## 2024-04-10 ENCOUNTER — Ambulatory Visit: Attending: Vascular Surgery | Admitting: Vascular Surgery

## 2024-04-10 ENCOUNTER — Encounter (HOSPITAL_COMMUNITY): Payer: Self-pay | Admitting: Internal Medicine

## 2024-04-10 ENCOUNTER — Telehealth: Payer: Self-pay | Admitting: Gastroenterology

## 2024-04-10 ENCOUNTER — Telehealth: Payer: Self-pay

## 2024-04-10 VITALS — BP 135/92 | HR 67 | Temp 98.2°F | Ht 66.0 in | Wt 153.0 lb

## 2024-04-10 DIAGNOSIS — K551 Chronic vascular disorders of intestine: Secondary | ICD-10-CM

## 2024-04-10 DIAGNOSIS — I7779 Dissection of other artery: Secondary | ICD-10-CM | POA: Diagnosis not present

## 2024-04-10 NOTE — Telephone Encounter (Signed)
 PT returning call. She would like to discuss if she can take her blood pressure medication. As well as if she needs to be there at 720am or 745am. Please advise.

## 2024-04-10 NOTE — Telephone Encounter (Signed)
 Returned the patient call, no answer, message left on voicemail to return call

## 2024-04-10 NOTE — Progress Notes (Signed)
 Barbara Odonnell   PCP-Tysinger PA Cardiologist- Vascular MD Vikki Graves Pulmonologist- n/a  EKG-01/09/24 Echo-2019 Cath-n/a Stress-n/a Vascular US - 04/10/24 ICD/PM-PM GLP1-n/a Blood Thinner- Eliquis  2 day hold   HX: Asthma, Celiac, Thrombosis of Mesenteric artery, SMA dissection. Patient last saw her vascular MD 04/10/24. Also of note does have on her chart she is intolerable of fentanyl  and propofol. She stated once in ICU they gave me too high of a dose and my BP bottomed out. With propofol, she got a choking sensation. She read off some prior sedations shes had for EGD/Colons and that she had versed and fentanyl  for most of them and did okay with that.  Anesthesia Review- Yes- okay to proceed

## 2024-04-10 NOTE — Progress Notes (Signed)
 Patient ID: Barbara Odonnell, female   DOB: [birth year], 48 y.o.   MRN: [id number]  Reason for Consult: Follow-up   Referred by Claudene Crystal, PA-C  Subjective:     HPI:  Barbara Odonnell is a 48 y.o. 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  She states that she has had some continued blood in her stool and upper and mid abdominal pain occasionally when she eats.  She has returned to Pilates but is monitoring her blood pressure closely and is on 3 antihypertensive medications currently.  She has not had any severe abdominal pain since our last visit.  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  She tells me that this gives her an increased risk of thoracic aortic pathology in the future.  She denies any family history of known connective tissue disorders but her mother is planning to have genetic testing.  Overall she has progressed well since our last visit.  Past Medical History:  Diagnosis Date   Allergy     Anemia    IV iron in past, prior to 2018   Asthma    Celiac disease    +prior biopsy   GERD (gastroesophageal reflux disease)    H/O bone density study 12/21/2017   normal   Hip fracture (HCC) 2008   right, Dr. Nolene Baumgarten   History of stress test 2014   [facility]   Hypothyroidism    Migraine headache without aura    gets bad nausea.  does well on Naratriptan  and Relpax.  Failed Imitrex , Maxalt    Osteopenia    Stress fracture    severeral prior   Transfusion history    has had IV iron prior, Dr. Maria Shiner prior management   Ulcer    previous ulcer with benign polyp removed   Family History  Problem Relation Age of Onset   Hypertension Mother    Osteopenia Mother    Raynaud syndrome Mother    Celiac disease Mother    Cancer Father        asbestos related lung cancer   Heart disease Father 57       Stent    Pulmonary embolism Father    Celiac disease Brother    Celiac disease Brother    Stroke Maternal Grandmother    COPD Maternal Grandmother    Osteopenia Maternal Grandmother    Heart disease Maternal Grandfather    Stroke Maternal Grandfather    Heart disease Paternal Grandfather        died 98yo   Diabetes Neg Hx    Past Surgical History:  Procedure Laterality Date   COLONOSCOPY  2015   [facility], advised to repeat q5 years; x 4 prior   COSMETIC SURGERY     nasal   ESOPHAGOGASTRODUODENOSCOPY     x5 prior as of 2016   INGUINAL HERNIA REPAIR     left   NASAL SEPTUM SURGERY     POLYPECTOMY     sigmoid colon   SINOSCOPY     WISDOM TOOTH EXTRACTION      Short Social History:  Social History   Tobacco Use   Smoking status: Never   Smokeless tobacco: Never  Substance Use Topics   Alcohol use: Yes    Alcohol/week: 2.0 standard drinks of alcohol    Types: 1 Glasses  of wine, 1 Shots of liquor per week    Comment: rare    Allergies  Allergen Reactions   Sulfa Antibiotics Anaphylaxis   Ciprofloxacin Other (See Comments)   Fentanyl  Other (See Comments)    BP drop   Gluten Meal     Celiac Disease   Other Hives    Dark alcohol   Propofol     intolerance   Singulair  [Montelukast  Sodium]     Angry, irritable, out of skin feeling    Current Outpatient Medications  Medication Sig Dispense Refill   albuterol  (VENTOLIN  HFA) 108 (90 Base) MCG/ACT inhaler Inhale 2 puffs into the lungs every 6 (six) hours as needed for wheezing or shortness of breath. 18 each 0   Albuterol  Sulfate (PROAIR  HFA IN) Inhale 1 puff into the lungs daily as needed.     amLODipine  (NORVASC ) 10 MG tablet Take 1 tablet (10 mg total) by mouth daily. (Patient taking differently: Take 5 mg by mouth daily.) 90 tablet 1   apixaban  (ELIQUIS ) 5 MG TABS tablet Take 1 tablet (5 mg total) by mouth 2 (two) times daily. 60 tablet 2   budesonide -formoterol  (SYMBICORT ) 160-4.5 MCG/ACT inhaler Inhale 2 puffs into  the lungs 2 (two) times daily. in the morning and at bedtime. 10.2 each 11   buPROPion  (WELLBUTRIN  XL) 150 MG 24 hr tablet TAKE 1 TABLET BY MOUTH EVERY DAY 90 tablet 1   carvedilol  (COREG ) 12.5 MG tablet TAKE 1 TABLET (12.5 MG TOTAL) BY MOUTH IN THE MORNING, AT NOON, AND AT BEDTIME. 270 tablet 1   cyclobenzaprine  (FLEXERIL ) 5 MG tablet Take 1 tablet (5 mg total) by mouth 3 (three) times daily as needed for muscle spasms (migraine). 30 tablet 0   diclofenac  (FLECTOR ) 1.3 % PTCH Place 1 patch onto the skin 2 (two) times daily. (Patient taking differently: Place 1 patch onto the skin as needed.) 60 patch 1   Fremanezumab -vfrm (AJOVY ) 225 MG/1.5ML SOAJ Inject 225 mg into the skin every 30 (thirty) days. Please run copay card: BIN [id number] PCN PDMI GRP [id number] ID [id number] 1.5 mL 11   hyoscyamine  (LEVSIN SL) 0.125 MG SL tablet Take 2 tablets (0.25 mg total) by mouth every 4 (four) hours as needed (bladder spasm). 30 tablet 0   lisinopril (ZESTRIL) 5 MG tablet Take 5 mg by mouth daily.     omeprazole  (PRILOSEC) 40 MG capsule TAKE 1 CAPSULE (40 MG TOTAL) BY MOUTH DAILY. 30 capsule 1   ondansetron  (ZOFRAN -ODT) 4 MG disintegrating tablet DISSOLVE 1 TABLET ON THE TONGUE EVERY 8 HOURS AS NEEDED FOR NAUSEA AND VOMITING 90 tablet 0   SYNTHROID  50 MCG tablet Take 1 tablet (50 mcg total) by mouth daily. 90 tablet 1   valACYclovir  (VALTREX ) 500 MG tablet TAKE 1 TABLET BY MOUTH EVERY DAY **MYLAN BRAND** (Patient taking differently: Take 500 mg by mouth daily as needed.  **MYLAN BRAND**) 90 tablet 0   No current facility-administered medications for this visit.    Review of Systems  Constitutional:  Constitutional negative. HENT: HENT negative.  Eyes: Eyes negative.  Respiratory: Respiratory negative.  Cardiovascular: Cardiovascular negative.  GI: Positive for abdominal pain and blood in stool.  Musculoskeletal: Musculoskeletal negative.  Skin: Skin negative.  Neurological: Neurological  negative. Hematologic: Hematologic/lymphatic negative.  Psychiatric: Psychiatric negative.        Objective:  Objective   Vitals:   04/10/24 1008  BP: (!) 135/92  Pulse: 67  Temp: 98.2 F (36.8 C)  SpO2: 100%  Weight: 153 lb (  69.4 kg)  Height: 5' 6 (1.676 m)   Body mass index is 24.69 kg/m.  Physical Exam HENT:     Head: Normocephalic.     Mouth/Throat:     Mouth: Mucous membranes are moist.  Eyes:     Pupils: Pupils are equal, round, and reactive to light.  Cardiovascular:     Rate and Rhythm: Normal rate.     Pulses: Normal pulses.  Pulmonary:     Effort: Pulmonary effort is normal.  Abdominal:     General: Abdomen is flat.     Palpations: Abdomen is soft.  Musculoskeletal:        General: Normal range of motion.     Right lower leg: No edema.     Left lower leg: No edema.  Skin:    General: Skin is warm and dry.     Capillary Refill: Capillary refill takes less than 2 seconds.  Neurological:     General: No focal deficit present.     Mental Status: She is alert. Mental status is at baseline.  Psychiatric:        Mood and Affect: Mood normal.        Thought Content: Thought content normal.        Judgment: Judgment normal.     Data: Duplex Findings:  +----------------------+--------+--------+------+----------+  Mesenteric           PSV cm/sEDV cm/sPlaque Comments   +----------------------+--------+--------+------+----------+  Aorta Prox              103                             +----------------------+--------+--------+------+----------+  Aorta Mid                77                             +----------------------+--------+--------+------+----------+  Aorta Distal             80                             +----------------------+--------+--------+------+----------+  Celiac Artery Proximal  183      58                     +----------------------+--------+--------+------+----------+  SMA Origin              239                  dissection  +----------------------+--------+--------+------+----------+  SMA Proximal            492                             +----------------------+--------+--------+------+----------+  SMA Mid                 457                             +----------------------+--------+--------+------+----------+  CHA                    169                             +----------------------+--------+--------+------+----------+  Splenic                177      37                     +----------------------+--------+--------+------+----------+  IMA                    104                             +----------------------+--------+--------+------+----------+     Mesenteric Technologist observations:  False lumen measuring 0.58cm, true lumen measuring 0.21cm   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           Summary:  Mesenteric:  Normal Hepatic artery, Splenic artery, Inferior Mesenteric artery and  Celiac artery findings. 70 to 99% stenosis in the superior mesenteric artery.  Proximal dissection noted in the SMA. Aneurysmal diliation noted in the  SMA measuring 0.98cm x 0.93 cm.      Assessment/Plan:    48 year old female with history of SMA dissection with similar signs SMA when compared to her most recent CT angio and with known stenosis which appears to be asymptomatic at this time.  She has followed with a geneticist and does have a diagnosis of a heterozygous gene although I am uncertain of the significance.  Her mother is also planning to be tested.  She is planning for endoscopy due to GI bleed and will need to hold Eliquis .  At this time she should be okay to transition to a single antiplatelet agent and off of anticoagulation if okay with her gastroenterologist whom I know to be excellent.  From a vascular standpoint as long as she is not having further symptoms she is okay to begin exercising at her normal  level and does not need to watch her blood pressure as diligently given that it appears to be relatively well-controlled on 3 agents particularly at rest.  She will follow-up with me in 6 months with repeat mesenteric duplex unless there are concerns prior at which time we could consider repeat CT angio.     Adine Hoof MD [facility]

## 2024-04-10 NOTE — Telephone Encounter (Addendum)
 Procedure:Colonoscopy/Endoscopy Procedure date: 04/18/24 Procedure location: [facility] Arrival Time: 7:20 am Spoke with the patient Y/N: .  No, I left a detailed message 04/10/24 @ 10:05 am for the patient to return call    Any prep concerns? No  Has the patient obtained the prep from the pharmacy ? Yes Do you have a care partner and transportation: Yes Any additional concerns? No

## 2024-04-11 ENCOUNTER — Other Ambulatory Visit: Payer: Self-pay | Admitting: *Deleted

## 2024-04-11 DIAGNOSIS — K551 Chronic vascular disorders of intestine: Secondary | ICD-10-CM

## 2024-04-11 DIAGNOSIS — I7779 Dissection of other artery: Secondary | ICD-10-CM

## 2024-04-13 ENCOUNTER — Other Ambulatory Visit: Payer: Self-pay | Admitting: Medical

## 2024-04-15 ENCOUNTER — Other Ambulatory Visit

## 2024-04-18 ENCOUNTER — Ambulatory Visit (HOSPITAL_COMMUNITY): Admitting: Anesthesiology

## 2024-04-18 ENCOUNTER — Encounter (HOSPITAL_COMMUNITY): Payer: Self-pay | Admitting: Internal Medicine

## 2024-04-18 ENCOUNTER — Other Ambulatory Visit: Payer: Self-pay

## 2024-04-18 ENCOUNTER — Encounter (HOSPITAL_COMMUNITY)
Admission: RE | Disposition: A | Discharge status: Home / Self Care | Payer: Self-pay | Source: Home / Self Care | Attending: Internal Medicine

## 2024-04-18 ENCOUNTER — Encounter: Payer: Self-pay | Admitting: Vascular Surgery

## 2024-04-18 ENCOUNTER — Ambulatory Visit (HOSPITAL_COMMUNITY)
Admission: RE | Admit: 2024-04-18 | Discharge: 2024-04-18 | Disposition: A | Discharge status: Home / Self Care | Payer: Self-pay | Attending: Internal Medicine | Admitting: Internal Medicine

## 2024-04-18 DIAGNOSIS — K625 Hemorrhage of anus and rectum: Secondary | ICD-10-CM

## 2024-04-18 DIAGNOSIS — I739 Peripheral vascular disease, unspecified: Secondary | ICD-10-CM | POA: Insufficient documentation

## 2024-04-18 DIAGNOSIS — K319 Disease of stomach and duodenum, unspecified: Secondary | ICD-10-CM

## 2024-04-18 DIAGNOSIS — J45909 Unspecified asthma, uncomplicated: Secondary | ICD-10-CM | POA: Insufficient documentation

## 2024-04-18 DIAGNOSIS — K3189 Other diseases of stomach and duodenum: Secondary | ICD-10-CM | POA: Insufficient documentation

## 2024-04-18 DIAGNOSIS — K900 Celiac disease: Secondary | ICD-10-CM | POA: Insufficient documentation

## 2024-04-18 DIAGNOSIS — I10 Essential (primary) hypertension: Secondary | ICD-10-CM | POA: Diagnosis not present

## 2024-04-18 DIAGNOSIS — K2980 Duodenitis without bleeding: Secondary | ICD-10-CM | POA: Diagnosis not present

## 2024-04-18 DIAGNOSIS — R1084 Generalized abdominal pain: Secondary | ICD-10-CM | POA: Diagnosis not present

## 2024-04-18 DIAGNOSIS — R109 Unspecified abdominal pain: Secondary | ICD-10-CM | POA: Diagnosis present

## 2024-04-18 DIAGNOSIS — R195 Other fecal abnormalities: Secondary | ICD-10-CM | POA: Diagnosis not present

## 2024-04-18 DIAGNOSIS — Z8249 Family history of ischemic heart disease and other diseases of the circulatory system: Secondary | ICD-10-CM | POA: Diagnosis not present

## 2024-04-18 DIAGNOSIS — Z79899 Other long term (current) drug therapy: Secondary | ICD-10-CM | POA: Diagnosis not present

## 2024-04-18 DIAGNOSIS — K2950 Unspecified chronic gastritis without bleeding: Secondary | ICD-10-CM | POA: Insufficient documentation

## 2024-04-18 DIAGNOSIS — K439 Ventral hernia without obstruction or gangrene: Secondary | ICD-10-CM | POA: Diagnosis not present

## 2024-04-18 DIAGNOSIS — Z8379 Family history of other diseases of the digestive system: Secondary | ICD-10-CM | POA: Diagnosis not present

## 2024-04-18 DIAGNOSIS — K644 Residual hemorrhoidal skin tags: Secondary | ICD-10-CM | POA: Diagnosis not present

## 2024-04-18 DIAGNOSIS — Z7901 Long term (current) use of anticoagulants: Secondary | ICD-10-CM | POA: Diagnosis not present

## 2024-04-18 DIAGNOSIS — K219 Gastro-esophageal reflux disease without esophagitis: Secondary | ICD-10-CM | POA: Diagnosis not present

## 2024-04-18 DIAGNOSIS — K31A0 Gastric intestinal metaplasia, unspecified: Secondary | ICD-10-CM

## 2024-04-18 DIAGNOSIS — E039 Hypothyroidism, unspecified: Secondary | ICD-10-CM | POA: Diagnosis not present

## 2024-04-18 HISTORY — PX: ESOPHAGOGASTRODUODENOSCOPY: SHX5428

## 2024-04-18 HISTORY — PX: COLONOSCOPY: SHX5424

## 2024-04-18 SURGERY — COLONOSCOPY
Anesthesia: Monitor Anesthesia Care

## 2024-04-18 MED ORDER — PROPOFOL 10 MG/ML IV BOLUS
INTRAVENOUS | Status: DC | PRN
  Administered 2024-04-18: 60 mg via INTRAVENOUS

## 2024-04-18 MED ORDER — SODIUM CHLORIDE 0.9 % IV SOLN: INTRAVENOUS | Status: DC

## 2024-04-18 MED ORDER — LIDOCAINE HCL (CARDIAC) PF 100 MG/5ML IV SOSY
PREFILLED_SYRINGE | INTRAVENOUS | Status: DC | PRN
  Administered 2024-04-18: 100 mg via INTRAVENOUS

## 2024-04-18 MED ORDER — PROPOFOL 500 MG/50ML IV EMUL
INTRAVENOUS | Status: DC | PRN
  Administered 2024-04-18: 160 ug/kg/min via INTRAVENOUS

## 2024-04-18 NOTE — Discharge Instructions (Signed)
 Nothing bad found. Possible gastritis or stomach inflammation. Biopsied.  Colonoscopy was ok - some hemorrhoids (we all have them) but I did not see signs of bleeding.  OK to use aspirin  after stopping the Eliquis .  I will follow-up with biopsy results and plans.  It will be interesting to see what happens with the bleeding after you stop the Eliquis . The description does sound like it is coming from the anorectal area.  I appreciate the opportunity to care for you. Kenney Peacemaker, MD, FACG  YOU HAD AN ENDOSCOPIC PROCEDURE TODAY: Refer to the procedure report and other information in the discharge instructions given to you for any specific questions about what was found during the examination. If this information does not answer your questions, please call Dr. Jadene Maxwell office at [phone] to clarify.   YOU SHOULD EXPECT: Some feelings of bloating in the abdomen. Passage of more gas than usual. Walking can help get rid of the air that was put into your GI tract during the procedure and reduce the bloating. If you had a lower endoscopy (such as a colonoscopy or flexible sigmoidoscopy) you may notice spotting of blood in your stool or on the toilet paper. Some abdominal soreness may be present for a day or two, also.  DIET: Your first meal following the procedure should be a light meal and then it is ok to progress to your normal diet. A half-sandwich or bowl of soup is an example of a good first meal. Heavy or fried foods are harder to digest and may make you feel nauseous or bloated. Drink plenty of fluids but you should avoid alcoholic beverages for 24 hours.   ACTIVITY: Your care partner should take you home directly after the procedure. You should plan to take it easy, moving slowly for the rest of the day. You can resume normal activity the day after the procedure however YOU SHOULD NOT DRIVE, use power tools, machinery or perform tasks that involve climbing or major physical exertion for  24 hours (because of the sedation medicines used during the test).   SYMPTOMS TO REPORT IMMEDIATELY: A gastroenterologist can be reached at any hour. Please call [phone]  for any of the following symptoms:  Following lower endoscopy (colonoscopy, flexible sigmoidoscopy) Excessive amounts of blood in the stool  Significant tenderness, worsening of abdominal pains  Swelling of the abdomen that is new, acute  Fever of 100 or higher  Following upper endoscopy (EGD, EUS, ERCP, esophageal dilation) Vomiting of blood or coffee ground material  New, significant abdominal pain  New, significant chest pain or pain under the shoulder blades  Painful or persistently difficult swallowing  New shortness of breath  Black, tarry-looking or red, bloody stools  FOLLOW UP:  If any biopsies were taken you will be contacted by phone or by letter within the next 1-3 weeks. Call [phone]  if you have not heard about the biopsies in 3 weeks.  Please also call with any specific questions about appointments or follow up tests.

## 2024-04-18 NOTE — Transfer of Care (Signed)
 Immediate Anesthesia Transfer of Care Note  Patient: Barbara Odonnell  Procedure(s) Performed: COLONOSCOPY EGD (ESOPHAGOGASTRODUODENOSCOPY)  Patient Location: PACU  Anesthesia Type:MAC  Level of Consciousness: awake, alert , and oriented  Airway & Oxygen Therapy: Patient Spontanous Breathing  Post-op Assessment: Report given to RN and Post -op Vital signs reviewed and stable  Post vital signs: Reviewed and stable  Last Vitals:  Vitals Value Taken Time  BP    Temp    Pulse    Resp    SpO2      Last Pain:  Vitals:   04/18/24 0814  TempSrc: Temporal  PainSc: 0-No pain         Complications: No notable events documented.

## 2024-04-18 NOTE — Op Note (Signed)
 [facility] Patient Name: Barbara Odonnell Procedure Date: 04/18/2024 MRN: [id number] Attending MD: Kenney Peacemaker , MD, [id number] Date of Birth: [birth year] CSN: [id number] Age: 48 Admit Type: Outpatient Procedure:                Upper GI endoscopy Indications:              Generalized abdominal pain, Heme positive stool,                            Celiac disease, Follow-up of celiac disease Providers:                Kenney Peacemaker, MD, Jacquelyn Jaci Shaw, RN,                            Arlin Benes, Technician, Trevor Fudge [place], CRNA Referring MD:              Medicines:                Monitored Anesthesia Care Complications:            No immediate complications. Estimated Blood Loss:     Estimated blood loss was minimal. Procedure:                Pre-Anesthesia Assessment:                           - Prior to the procedure, a History and Physical                            was performed, and patient medications and                            allergies were reviewed. The patient's tolerance of                            previous anesthesia was also reviewed. The risks                            and benefits of the procedure and the sedation                            options and risks were discussed with the patient.                            All questions were answered, and informed consent                            was obtained. Prior Anticoagulants: The patient                            last took Eliquis  (apixaban ) 2 days prior to the                            procedure. ASA Grade Assessment: II - A patient  with mild systemic disease. After reviewing the                            risks and benefits, the patient was deemed in                            satisfactory condition to undergo the procedure.                           After obtaining informed consent, the endoscope was                            passed under direct  vision. Throughout the                            procedure, the patient's blood pressure, pulse, and                            oxygen saturations were monitored continuously. The                            [id number]-[id number] ([id number]) Olympus endoscope was introduced                            through the mouth, and advanced to the second part                            of duodenum. The upper GI endoscopy was                            accomplished without difficulty. The patient                            tolerated the procedure well. Scope In: Scope Out: Findings:      Patchy mildly erythematous mucosa without bleeding was found in the       gastric antrum. Biopsies were taken with a cold forceps for histology.       Verification of patient identification for the specimen was done.       Estimated blood loss was minimal.      The exam was otherwise without abnormality.      Biopsies for histology were taken with a cold forceps in the duodenal       bulb and in the second portion of the duodenum for evaluation of celiac       disease. Verification of patient identification for the specimen was       done. Estimated blood loss was minimal.      The cardia and gastric fundus were otherwise normal on retroflexion. Impression:               - Erythematous mucosa in the antrum. Biopsied.                           - The examination was otherwise normal. Does have a  J-shaped stomach (normal variant) No clear cause of                            symptoms and no cause of bleeding seen                           - Biopsies were taken with a cold forceps for                            evaluation of celiac disease. Moderate Sedation:      Not Applicable - Patient had care per Anesthesia. Recommendation:           - See colonoscopy report from today. Procedure Code(s):        --- Professional ---                           [phone], Esophagogastroduodenoscopy, flexible,                             transoral; with biopsy, single or multiple Diagnosis Code(s):        --- Professional ---                           K31.89, Other diseases of stomach and duodenum                           R10.84, Generalized abdominal pain                           R19.5, Other fecal abnormalities                           K90.0, Celiac disease CPT copyright 2022 American [employer]. All rights reserved. The codes documented in this report are preliminary and upon coder review may  be revised to meet current compliance requirements. Kenney Peacemaker, MD 04/18/2024 10:42:59 AM This report has been signed electronically. Number of Addenda: 0

## 2024-04-18 NOTE — Anesthesia Preprocedure Evaluation (Addendum)
 Anesthesia Evaluation  Patient identified by MRN, date of birth, ID band Patient awake    Reviewed: Allergy  & Precautions, NPO status , Patient's Chart, lab work & pertinent test results, reviewed documented beta blocker date and time   Airway Mallampati: II  TM Distance: >3 FB Neck ROM: Full    Dental  (+) Teeth Intact, Dental Advisory Given   Pulmonary asthma    Pulmonary exam normal breath sounds clear to auscultation       Cardiovascular hypertension, Pt. on medications and Pt. on home beta blockers + Peripheral Vascular Disease  Normal cardiovascular exam Rhythm:Regular Rate:Normal     Neuro/Psych  Headaches  negative psych ROS   GI/Hepatic Neg liver ROS,GERD  Medicated,,  Endo/Other  Hypothyroidism    Renal/GU negative Renal ROS     Musculoskeletal negative musculoskeletal ROS (+)    Abdominal   Peds  Hematology  (+) Blood dyscrasia (ELiquis )   Anesthesia Other Findings Day of surgery medications reviewed with the patient.  Reproductive/Obstetrics                             Anesthesia Physical Anesthesia Plan  ASA: 2  Anesthesia Plan: MAC   Post-op Pain Management:    Induction: Intravenous  PONV Risk Score and Plan: 2 and TIVA and Treatment may vary due to age or medical condition  Airway Management Planned: Natural Airway and Simple Face Mask  Additional Equipment:   Intra-op Plan:   Post-operative Plan:   Informed Consent: I have reviewed the patients History and Physical, chart, labs and discussed the procedure including the risks, benefits and alternatives for the proposed anesthesia with the patient or authorized representative who has indicated his/her understanding and acceptance.     Dental advisory given  Plan Discussed with: CRNA and Anesthesiologist  Anesthesia Plan Comments: (Precedex gtt)       Anesthesia Quick Evaluation

## 2024-04-18 NOTE — Op Note (Addendum)
 [facility] Patient Name: Barbara Odonnell Procedure Date: 04/18/2024 MRN: [id number] Attending MD: Kenney Peacemaker , MD, [id number] Date of Birth: Jan 27, 1976 CSN: [id number] Age: 48 Admit Type: Outpatient Procedure:                Colonoscopy Indications:              Generalized abdominal pain, Heme positive stool,                            Rectal bleeding Providers:                Kenney Peacemaker, MD, Taunya Fass, RN,                            Arlin Benes, Technician, Trevor Fudge [place], CRNA Referring MD:              Medicines:                Monitored Anesthesia Care Complications:            No immediate complications. Estimated Blood Loss:     Estimated blood loss: none. Procedure:                Pre-Anesthesia Assessment:                           - Prior to the procedure, a History and Physical                            was performed, and patient medications and                            allergies were reviewed. The patient's tolerance of                            previous anesthesia was also reviewed. The risks                            and benefits of the procedure and the sedation                            options and risks were discussed with the patient.                            All questions were answered, and informed consent                            was obtained. Prior Anticoagulants: The patient                            last took Eliquis  (apixaban ) 2 days prior to the                            procedure. ASA Grade Assessment: II - A patient  with mild systemic disease. After reviewing the                            risks and benefits, the patient was deemed in                            satisfactory condition to undergo the procedure.                           After obtaining informed consent, the colonoscope                            was passed under direct vision. Throughout the                             procedure, the patient's blood pressure, pulse, and                            oxygen saturations were monitored continuously. The                            [id number]-[id number] ([id number]) Olympus colonoscope was                            introduced through the anus and advanced to the the                            terminal ileum, with identification of the                            appendiceal orifice and IC valve. The colonoscopy                            was performed without difficulty. The patient                            tolerated the procedure well. The bowel preparation                            used was SUPREP via split dose instruction. The                            quality of the bowel preparation was good. The                            terminal ileum, ileocecal valve, appendiceal                            orifice, and rectum were photographed. Scope In: 10:05:29 AM Scope Out: 10:25:00 AM Scope Withdrawal Time: 0 hours 14 minutes 47 seconds  Total Procedure Duration: 0 hours 19 minutes 31 seconds  Findings:      The perianal and digital rectal examinations were normal.      The terminal ileum appeared normal.  External hemorrhoids were found. The hemorrhoids were small and in the       anal canal without prolapse. No bleeding stigmata.      The exam was otherwise without abnormality on direct and retroflexion       views. Impression:               - The examined portion of the ileum was normal.                           - External hemorrhoids. ? if these bleed at time.                           - The examination was otherwise normal on direct                            and retroflexion views. No cause of abdominal pain                            found. We know she has musculoskeletal causes.                           - No specimens collected. Moderate Sedation:      Not Applicable - Patient had care per Anesthesia. Recommendation:           - Patient has a contact  number available for                            emergencies. The signs and symptoms of potential                            delayed complications were discussed with the                            patient. Return to normal activities tomorrow.                            Written discharge instructions were provided to the                            patient.                           - Resume previous diet.                           - Continue present medications.                           - Repeat colonoscopy in 10 years. (Remote history                            of a small adenoma x 1)                           - Await EGD pathology results  She may discontinue Eliquis  as per Dr. Horald Lyme                            instructions                           OK to use ASA daily - defer to Dr. Vikki Graves for dose                           Observe for more bleeding off Eliquis                            NOTE She thinks she has been seeing some melena and                            has tested that and is heme + ao anticipate a                            cpasule endoscopy test. Will arrange after EGD                            pathology review. Procedure Code(s):        --- Professional ---                           [phone], Colonoscopy, flexible; diagnostic, including                            collection of specimen(s) by brushing or washing,                            when performed (separate procedure) Diagnosis Code(s):        --- Professional ---                           K64.4, Residual hemorrhoidal skin tags                           R10.84, Generalized abdominal pain                           R19.5, Other fecal abnormalities                           K62.5, Hemorrhage of anus and rectum CPT copyright 2022 American Medical Association. All rights reserved. The codes documented in this report are preliminary and upon coder review may  be revised to meet current compliance  requirements. Kenney Peacemaker, MD 04/18/2024 10:49:23 AM This report has been signed electronically. Number of Addenda: 0

## 2024-04-18 NOTE — Anesthesia Postprocedure Evaluation (Signed)
 Anesthesia Post Note  Patient: Barbara Odonnell  Procedure(s) Performed: COLONOSCOPY EGD (ESOPHAGOGASTRODUODENOSCOPY)     Patient location during evaluation: Endoscopy Anesthesia Type: MAC Level of consciousness: oriented, awake and alert and awake Pain management: pain level controlled Vital Signs Assessment: post-procedure vital signs reviewed and stable Respiratory status: spontaneous breathing, nonlabored ventilation, respiratory function stable and patient connected to nasal cannula oxygen Cardiovascular status: blood pressure returned to baseline and stable Postop Assessment: no headache, no backache and no apparent nausea or vomiting Anesthetic complications: no   No notable events documented.  Last Vitals:  Vitals:   04/18/24 1040 04/18/24 1050  BP: 112/71 127/86  Pulse: 76 71  Resp: 14 19  Temp:    SpO2: 100% 100%    Last Pain:  Vitals:   04/18/24 1050  TempSrc:   PainSc: 0-No pain                 Erin Havers

## 2024-04-18 NOTE — H&P (Addendum)
 [facility] History and Physical   Primary Care Physician:  Claudene Crystal, PA-C   Reason for Procedure:   Heme + stool, rectal bleeding, abdominal pain and celiac disease  Plan:    EGD, colonoscopy     HPI: Barbara Odonnell is a 48 y.o. female  with a long history of celiac disease diagnosed at [facility] years ago (2003), recent diagnosis of superior mesenteric artery dissection followed by Dr. Angela Kell, who has had problems with episodic bright red blood on the stool and has been found to be Hemoccult positive.  She had seen Dr. Nickey Barn for this, and she was seeking a second opinion with me and that was against Dr. Cleora Daft practice policy so she was dismissed from that practice.  She is describing intermittent blood streaked on the stool was mentioned without straining or rectal pain or obvious hemorrhoids.  Also mucous discharge at times.  There is history of colonoscopy around 2009 or 2010,  in [place]  because of hematochezia and she had some sort of self-limited colitis.  No chronic inflammatory bowel disease reported.  She has self tested and has been Hemoccult positive at times with normal colored stool and Dr. Nickey Barn found her to be heme positive at his evaluation in the office 01/10/2024. She continues to have rectal bneedng intermittently.   She has bilateral upper quadrant and left lower quadrant pains that are intermittent and jabbing but she also has some crampy abdominal pain at times.  The crampy pain is relieved with Levsin .  She had been very active doing Pilates and other workouts prior to all this and she has had to restrict that due to a need to keep blood pressure below 140.  She was very hypertensive when she presented with her SMA dissection.  That was on January 31.  She has had a second opinion for her SMA dissection at the [facility] and both there and Dr. Angela Kell do not think surgical intervention is warranted.  It is not thought her abdominal pain is  related.  She has been given the okay to hold Eliquis  for colonoscopy and endoscopy.  She has seen Dr. Audrie Blind at [facility] for hernias.  She has a periumbilical hernia and an epigastric hernia.  She is getting a workup for connective tissue disease with serologies through the bariatric surgeon who saw her.  She tells the impression of Dr. 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   Celiac disease is reportedly under control although she has not been followed with serologies and I am not certain which serology of any was ever elevated.  She has a very strong family history of celiac disease also.  She has had multiple endoscopies over the years she has been on parenteral iron in the past but is confident she is gluten-free.  The abdominal pain and symptoms she has had of only been a relatively recent development.   She has specific sedation concerns and says she cannot tolerate fentanyl  or propofol.  Fentanyl  caused hypotension and propofol made her feel like she was choking when she was sedated and prefers not to have that again.   There is a remote history of a colon polyp.  The patient is not certain of the pathology.  Previous recent gastroenterologist who she follows with regularly was in  [place]  but I believe he is retired.   Most recent CTA on 01/02/2024 showed no significant change in the dissection of the proximal  SMA with mural thrombus and false lumen which causes severe stenosis and aneurysmal dilation of the SMA distal to the study occlusion measuring 9 mm.   Dr. Vikki Graves has indicated she may stop Eliquis  and start anti-PLT therapy. 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 Nolene Baumgarten   History of stress test 2014   [facility]   Hypothyroidism    Migraine headache without aura    gets bad nausea.  does well on Naratriptan  and Relpax.  Failed Imitrex , Maxalt    Osteopenia    Stress fracture    severeral prior   Transfusion history    has had IV iron prior, Dr. Maria Shiner prior management   Ulcer    previous ulcer with benign polyp removed    Past Surgical History:  Procedure Laterality Date   COLONOSCOPY  2015   [facility], advised to repeat q5 years; x 4 prior   COSMETIC SURGERY     nasal   ESOPHAGOGASTRODUODENOSCOPY     x5 prior as of 2016   INGUINAL HERNIA REPAIR     left   NASAL SEPTUM SURGERY     POLYPECTOMY     sigmoid colon   SINOSCOPY     WISDOM TOOTH EXTRACTION      Prior to Admission medications   Medication Sig Start Date End Date Taking? Authorizing Provider  albuterol  (VENTOLIN  HFA) 108 (90 Base) MCG/ACT inhaler Inhale 2 puffs into the lungs every 6 (six) hours as needed for wheezing or shortness of breath. 02/13/24  Yes Watson Hacking, MD  Albuterol  Sulfate (PROAIR  HFA IN) Inhale 1 puff into the lungs daily as needed.   Yes [provider]  amLODipine  (NORVASC ) 10 MG tablet Take 1 tablet (10 mg total) by mouth daily. Patient taking differently: Take 5 mg by mouth daily. 12/19/23  Yes Tysinger, Christiane Cowing, PA-C  budesonide -formoterol  (SYMBICORT ) 160-4.5 MCG/ACT inhaler Inhale 2 puffs into the lungs 2 (two) times daily. in the morning and at bedtime. 11/06/23  Yes Tysinger, Christiane Cowing, PA-C  buPROPion  (WELLBUTRIN  XL) 150 MG 24 hr tablet TAKE 1 TABLET BY MOUTH EVERY DAY 10/30/23  Yes Tysinger, Christiane Cowing, PA-C  carvedilol  (COREG ) 12.5 MG tablet TAKE 1 TABLET (12.5 MG TOTAL) BY MOUTH IN THE MORNING, AT NOON, AND AT BEDTIME. 01/10/24 01/09/25 Yes Tysinger,  Christiane Cowing, PA-C  Fremanezumab -vfrm (AJOVY ) 225 MG/1.5ML SOAJ Inject 225 mg into the skin every 30 (thirty) days. Please run copay card: BIN [id number] PCN [id number] GRP [id number] ID [id number] 08/14/23  Yes Glory Larsen, MD  hyoscyamine  (LEVSIN  SL) 0.125 MG SL tablet Take 2 tablets (0.25 mg total) by mouth every 4 (four) hours as needed (bladder spasm). 12/05/23  Yes Fonnie Iba I, MD  lisinopril (ZESTRIL) 5 MG tablet Take 5 mg by mouth daily.   Yes [provider]  omeprazole  (PRILOSEC) 40 MG capsule TAKE 1 CAPSULE (40 MG TOTAL) BY MOUTH DAILY. 02/28/24  Yes Tysinger, Christiane Cowing, PA-C  ondansetron  (ZOFRAN -ODT) 4 MG disintegrating tablet DISSOLVE 1 TABLET ON THE TONGUE EVERY 8 HOURS AS NEEDED FOR NAUSEA AND VOMITING 04/02/24  Yes Tysinger, Christiane Cowing, PA-C  SYNTHROID  50 MCG tablet Take 1 tablet (50 mcg total) by mouth daily. 11/06/23  Yes Tysinger, Christiane Cowing, PA-C  valACYclovir  (VALTREX ) 500 MG tablet TAKE 1 TABLET BY MOUTH EVERY DAY **MYLAN BRAND** Patient taking differently: Take 500 mg by mouth daily as needed.  **MYLAN BRAND** 12/01/23  Yes Tysinger, Christiane Cowing, PA-C  cyclobenzaprine  (FLEXERIL ) 5 MG tablet Take 1 tablet (5 mg total) by mouth 3 (three) times daily as needed for muscle spasms (migraine). 12/05/23   Doroteo Gasmen, MD  diclofenac  (FLECTOR ) 1.3 % PTCH Place 1 patch onto the skin 2 (two) times daily. Patient taking differently: Place 1 patch onto the skin as needed. 09/19/23   Hudnall, Fabienne Holter, MD  ELIQUIS  5 MG TABS tablet TAKE 1 TABLET BY MOUTH TWICE A DAY 04/15/24   Early, Adriane Albe, NP    Current Facility-Administered Medications  Medication Dose Route Frequency Provider Last Rate Last Admin   0.9 %  sodium chloride  infusion   Intravenous Continuous Kenney Peacemaker, MD        Allergies as of 02/14/2024 - Review Complete 02/14/2024  Allergen Reaction Noted   Sulfa antibiotics Anaphylaxis 07/25/2011   Fentanyl  Other (See Comments) 12/06/2023   Gluten meal  12/02/2023   Other Hives  07/16/2020   Propofol  01/25/2024   Singulair  [montelukast  sodium]  10/08/2015    Family History  Problem Relation Age of Onset   Hypertension Mother    Osteopenia Mother    Raynaud syndrome Mother    Celiac disease Mother    Cancer Father        asbestos related lung cancer   Heart disease Father 42       Stent   Pulmonary embolism Father    Celiac disease Brother    Celiac disease Brother    Stroke Maternal Grandmother    COPD Maternal Grandmother    Osteopenia Maternal Grandmother    Heart disease Maternal Grandfather    Stroke Maternal Grandfather    Heart disease Paternal Grandfather        died 26yo   Diabetes Neg Hx     Social History   Socioeconomic History   Marital status: Single    Spouse name: Not on file   Number of children: Not on file   Years of education: Not on file   Highest education level: Doctorate  Occupational History   Not on file  Tobacco Use   Smoking status: Never   Smokeless tobacco: Never  Vaping Use   Vaping status: Never Used  Substance and Sexual Activity   Alcohol use: Yes    Alcohol/week: 2.0 standard drinks of alcohol    Types: 1 Glasses of wine, 1 Shots of liquor per week    Comment: rare   Drug use: No   Sexual activity: Not on file  Other Topics Concern   Not on file  Social History Narrative   Lives alone, 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   Prior worked as professor at [employer].  Exercise:- 6-7 days per week, teaches spin at [employer], swam competitively in college, marathon runner.       [facility]:   Originally from [place]. Has also lived in [place], [place], & [place].  Currently working in counseling. No pets currently. No bird exposure. No mold or hot tub exposure. Currently has carpet in the bedroom. No draperies. No exposure to asbestos.      Social Drivers of Corporate investment banker Strain: Low Risk  (01/19/2024)    Received from [facility]   Overall Financial Resource Strain (CARDIA)    Difficulty of Paying Living Expenses: Not hard at all  Food Insecurity: No Food Insecurity (01/19/2024)   Received from [facility]   Hunger Vital Sign    Within the past 12 months, you worried that your food would run out before you got the money to buy more.: Never true    Within the past 12 months, the food you bought just didn't last and you didn't have money to get more.: Never true  Recent Concern: Food Insecurity - Food Insecurity Present (11/06/2023)   Hunger Vital Sign    Worried About Running Out of Food in the Last Year: Sometimes true    Ran Out of Food in the Last Year: Never true  Transportation Needs: No Transportation Needs (01/19/2024)   Received from [facility]   PRAPARE - Transportation    In the past 12 months, has lack of transportation kept you from medical appointments or from getting medications?: No    Lack of Transportation (Non-Medical): No  Physical Activity: Sufficiently Active (11/06/2023)   Exercise Vital Sign    Days of Exercise per Week: 5 days    Minutes of Exercise per Session: 60 min  Stress: Stress Concern Present (11/06/2023)   [employer] of Occupational Health - Occupational Stress Questionnaire    Feeling of Stress : To some extent  Social Connections: Moderately Isolated (12/06/2023)   Social Connection and Isolation Panel    Frequency of Communication with Friends and Family: More than three times a week    Frequency of Social Gatherings with Friends and Family: More than three times a week    Attends Religious Services: Never    Database administrator or Organizations: Yes    Attends Engineer, structural: More than 4 times per year    Marital Status: Never married  Intimate Partner Violence: Not At Risk (12/06/2023)   Humiliation, Afraid, Rape, and Kick questionnaire    Fear of Current or Ex-Partner: No     Emotionally Abused: No    Physically Abused: No    Sexually Abused: No    Review of Systems:  All other review of systems negative except as mentioned in the HPI.  Physical Exam: Vital signs BP 134/87   Pulse 75   Temp 97.6 F (36.4 C) (Temporal)   Resp 17   Ht 5' 7 (1.702 m)   Wt 67.6 kg   SpO2 100%   BMI 23.34 kg/m   General:   Alert,  Well-developed, well-nourished, pleasant and cooperative in NAD Lungs:  Clear throughout to auscultation.   Heart:  Regular rate and rhythm; no murmurs, clicks, rubs,  or gallops. Abdomen:  Soft, muscular and mildly tender diffusely. Nondistended. Normal bowel sounds. Not evaluated for hernias today in past when erect small bulges periumbilical and epigastric  Neuro/Psych:  Alert and cooperative. Normal mood and affect. A and O x 3   @Rondale Nies  Tammie Fall, MD, [facility] [phone] (pager) 04/18/2024 9:39 AM@

## 2024-04-19 ENCOUNTER — Ambulatory Visit: Payer: Self-pay | Admitting: Internal Medicine

## 2024-04-19 LAB — SURGICAL PATHOLOGY

## 2024-04-20 ENCOUNTER — Encounter (HOSPITAL_COMMUNITY): Payer: Self-pay | Admitting: Internal Medicine

## 2024-04-22 ENCOUNTER — Other Ambulatory Visit: Payer: Self-pay

## 2024-04-22 DIAGNOSIS — R195 Other fecal abnormalities: Secondary | ICD-10-CM

## 2024-04-22 DIAGNOSIS — K625 Hemorrhage of anus and rectum: Secondary | ICD-10-CM

## 2024-04-24 ENCOUNTER — Ambulatory Visit: Admitting: Medical

## 2024-04-28 ENCOUNTER — Other Ambulatory Visit: Payer: Self-pay | Admitting: Medical

## 2024-04-30 ENCOUNTER — Other Ambulatory Visit: Payer: Self-pay | Admitting: Medical

## 2024-05-07 ENCOUNTER — Telehealth: Payer: Self-pay | Admitting: Cardiology

## 2024-05-07 NOTE — Telephone Encounter (Signed)
 FYI Pt cx her appt for 7/15 With Dr Lavona because she has found another cardiologist. She said she really liked doctor Hochrein but has not be able to get in with him for 3 yrs. Also when she had a medical emergency she could not get in and was booked out.

## 2024-05-08 ENCOUNTER — Other Ambulatory Visit: Payer: Self-pay | Admitting: Medical

## 2024-05-08 ENCOUNTER — Ambulatory Visit: Admitting: Internal Medicine

## 2024-05-08 DIAGNOSIS — R195 Other fecal abnormalities: Secondary | ICD-10-CM

## 2024-05-08 DIAGNOSIS — K900 Celiac disease: Secondary | ICD-10-CM

## 2024-05-08 DIAGNOSIS — R1084 Generalized abdominal pain: Secondary | ICD-10-CM

## 2024-05-08 NOTE — Progress Notes (Signed)
 SN: [id number] Exp: 12/28/2024 LOT: [id number]  Patient arrived for VCE. Reported the prep went well. This RN explained capsule dietary restrictions for the next few hours. Pt advised to return at 4 pm to return capsule equipment.  Patient verbalized understanding. Opened capsule, ensured capsule was flashing prior to the patient swallowing the capsule. Patient swallowed capsule without difficulty.  Patient told to call the office with any questions and if capsule has not passed after 72 hours. No further questions by the conclusion of the visit.

## 2024-05-08 NOTE — Patient Instructions (Signed)
 You may have clear liquids beginning at 10:30 am after ingesting the capsule.    You can have a light lunch at 12:30 pm; sandwich and half bowl of soup.  Return to the office at 4 pm to return the equipment.   Return to you normal diet at 5 pm.   Call [phone] and ask for Hilma Favors BSN RN if you have any questions.  You should pass the capsule in your stool 8-48 hours after ingestion. If you have not passed the capsule, after 72 hours, please contact the office at [phone].

## 2024-05-13 ENCOUNTER — Other Ambulatory Visit: Payer: Self-pay | Admitting: Medical

## 2024-05-14 ENCOUNTER — Other Ambulatory Visit

## 2024-05-14 ENCOUNTER — Ambulatory Visit: Admitting: Cardiology

## 2024-05-15 ENCOUNTER — Other Ambulatory Visit

## 2024-05-17 ENCOUNTER — Other Ambulatory Visit: Payer: Self-pay | Admitting: Medical

## 2024-05-17 ENCOUNTER — Encounter: Payer: Self-pay | Admitting: Internal Medicine

## 2024-05-20 DIAGNOSIS — Z1371 Encounter for nonprocreative screening for genetic disease carrier status: Secondary | ICD-10-CM | POA: Diagnosis not present

## 2024-05-20 DIAGNOSIS — E782 Mixed hyperlipidemia: Secondary | ICD-10-CM | POA: Diagnosis not present

## 2024-05-20 DIAGNOSIS — Z7183 Encounter for nonprocreative genetic counseling: Secondary | ICD-10-CM | POA: Diagnosis not present

## 2024-05-20 DIAGNOSIS — E785 Hyperlipidemia, unspecified: Secondary | ICD-10-CM | POA: Diagnosis not present

## 2024-05-20 DIAGNOSIS — Z1589 Genetic susceptibility to other disease: Secondary | ICD-10-CM | POA: Diagnosis not present

## 2024-05-20 DIAGNOSIS — Z8249 Family history of ischemic heart disease and other diseases of the circulatory system: Secondary | ICD-10-CM | POA: Diagnosis not present

## 2024-05-21 ENCOUNTER — Encounter: Payer: Self-pay | Admitting: Internal Medicine

## 2024-05-22 ENCOUNTER — Other Ambulatory Visit

## 2024-05-27 ENCOUNTER — Other Ambulatory Visit: Payer: Self-pay | Admitting: Medical

## 2024-05-28 DIAGNOSIS — I7779 Dissection of other artery: Secondary | ICD-10-CM | POA: Diagnosis not present

## 2024-06-09 ENCOUNTER — Other Ambulatory Visit: Payer: Self-pay | Admitting: Medical

## 2024-06-11 ENCOUNTER — Other Ambulatory Visit: Payer: Self-pay | Admitting: Medical Genetics

## 2024-06-11 DIAGNOSIS — I788 Other diseases of capillaries: Secondary | ICD-10-CM | POA: Diagnosis not present

## 2024-06-11 DIAGNOSIS — L650 Telogen effluvium: Secondary | ICD-10-CM | POA: Diagnosis not present

## 2024-06-11 DIAGNOSIS — L2989 Other pruritus: Secondary | ICD-10-CM | POA: Diagnosis not present

## 2024-06-11 DIAGNOSIS — L819 Disorder of pigmentation, unspecified: Secondary | ICD-10-CM | POA: Diagnosis not present

## 2024-06-13 ENCOUNTER — Other Ambulatory Visit: Payer: Self-pay

## 2024-06-13 DIAGNOSIS — Z006 Encounter for examination for normal comparison and control in clinical research program: Secondary | ICD-10-CM

## 2024-06-18 ENCOUNTER — Other Ambulatory Visit

## 2024-06-18 DIAGNOSIS — Z7901 Long term (current) use of anticoagulants: Secondary | ICD-10-CM

## 2024-06-18 DIAGNOSIS — K921 Melena: Secondary | ICD-10-CM

## 2024-06-18 DIAGNOSIS — R7989 Other specified abnormal findings of blood chemistry: Secondary | ICD-10-CM

## 2024-06-18 DIAGNOSIS — K551 Chronic vascular disorders of intestine: Secondary | ICD-10-CM

## 2024-06-19 ENCOUNTER — Other Ambulatory Visit

## 2024-06-19 DIAGNOSIS — E7800 Pure hypercholesterolemia, unspecified: Secondary | ICD-10-CM | POA: Diagnosis not present

## 2024-06-20 LAB — CBC WITH DIFFERENTIAL/PLATELET
Basophils Absolute: 0.1 x10E3/uL (ref 0.0–0.2)
Basos: 1 %
EOS (ABSOLUTE): 0.9 x10E3/uL — ABNORMAL HIGH (ref 0.0–0.4)
Eos: 10 %
Hematocrit: 45.5 % (ref 34.0–46.6)
Hemoglobin: 14.8 g/dL (ref 11.1–15.9)
Immature Grans (Abs): 0 x10E3/uL (ref 0.0–0.1)
Immature Granulocytes: 0 %
Lymphocytes Absolute: 1.5 x10E3/uL (ref 0.7–3.1)
Lymphs: 17 %
MCH: 32.1 pg (ref 26.6–33.0)
MCHC: 32.5 g/dL (ref 31.5–35.7)
MCV: 99 fL — ABNORMAL HIGH (ref 79–97)
Monocytes Absolute: 0.6 x10E3/uL (ref 0.1–0.9)
Monocytes: 6 %
Neutrophils Absolute: 6.1 x10E3/uL (ref 1.4–7.0)
Neutrophils: 66 %
Platelets: 281 x10E3/uL (ref 150–450)
RBC: 4.61 x10E6/uL (ref 3.77–5.28)
RDW: 12.1 % (ref 11.7–15.4)
WBC: 9.2 x10E3/uL (ref 3.4–10.8)

## 2024-06-20 LAB — RENAL FUNCTION PANEL
Albumin: 4.2 g/dL (ref 3.9–4.9)
BUN/Creatinine Ratio: 11 (ref 9–23)
BUN: 10 mg/dL (ref 6–24)
CO2: 17 mmol/L — AB (ref 20–29)
Calcium: 9.5 mg/dL (ref 8.7–10.2)
Chloride: 99 mmol/L (ref 96–106)
Creatinine, Ser: 0.92 mg/dL (ref 0.57–1.00)
Glucose: 95 mg/dL (ref 70–99)
Phosphorus: 2.8 mg/dL — AB (ref 3.0–4.3)
Potassium: 4.5 mmol/L (ref 3.5–5.2)
Sodium: 136 mmol/L (ref 134–144)
eGFR: 76 mL/min/1.73 (ref >59)

## 2024-06-21 ENCOUNTER — Ambulatory Visit: Payer: Self-pay | Admitting: Medical

## 2024-06-21 LAB — GENECONNECT MOLECULAR SCREEN: Genetic Analysis Overall Interpretation: NEGATIVE

## 2024-06-21 NOTE — Progress Notes (Signed)
 Results thru my chart

## 2024-06-24 DIAGNOSIS — L821 Other seborrheic keratosis: Secondary | ICD-10-CM | POA: Diagnosis not present

## 2024-06-24 DIAGNOSIS — L708 Other acne: Secondary | ICD-10-CM | POA: Diagnosis not present

## 2024-06-24 DIAGNOSIS — L728 Other follicular cysts of the skin and subcutaneous tissue: Secondary | ICD-10-CM | POA: Diagnosis not present

## 2024-06-24 DIAGNOSIS — D1801 Hemangioma of skin and subcutaneous tissue: Secondary | ICD-10-CM | POA: Diagnosis not present

## 2024-06-26 DIAGNOSIS — J45909 Unspecified asthma, uncomplicated: Secondary | ICD-10-CM | POA: Diagnosis not present

## 2024-06-26 DIAGNOSIS — K219 Gastro-esophageal reflux disease without esophagitis: Secondary | ICD-10-CM | POA: Diagnosis not present

## 2024-06-26 DIAGNOSIS — I7779 Dissection of other artery: Secondary | ICD-10-CM | POA: Diagnosis not present

## 2024-06-26 DIAGNOSIS — J8283 Eosinophilic asthma: Secondary | ICD-10-CM | POA: Diagnosis not present

## 2024-06-27 DIAGNOSIS — Z1389 Encounter for screening for other disorder: Secondary | ICD-10-CM | POA: Diagnosis not present

## 2024-06-27 DIAGNOSIS — G43909 Migraine, unspecified, not intractable, without status migrainosus: Secondary | ICD-10-CM | POA: Diagnosis not present

## 2024-06-27 DIAGNOSIS — N951 Menopausal and female climacteric states: Secondary | ICD-10-CM | POA: Diagnosis not present

## 2024-06-27 DIAGNOSIS — N763 Subacute and chronic vulvitis: Secondary | ICD-10-CM | POA: Diagnosis not present

## 2024-07-01 ENCOUNTER — Other Ambulatory Visit: Payer: Self-pay | Admitting: Medical

## 2024-07-02 DIAGNOSIS — L299 Pruritus, unspecified: Secondary | ICD-10-CM | POA: Diagnosis not present

## 2024-07-02 DIAGNOSIS — L858 Other specified epidermal thickening: Secondary | ICD-10-CM | POA: Diagnosis not present

## 2024-07-02 DIAGNOSIS — L739 Follicular disorder, unspecified: Secondary | ICD-10-CM | POA: Diagnosis not present

## 2024-07-02 DIAGNOSIS — I781 Nevus, non-neoplastic: Secondary | ICD-10-CM | POA: Diagnosis not present

## 2024-07-04 ENCOUNTER — Other Ambulatory Visit: Payer: Self-pay | Admitting: Medical

## 2024-07-10 ENCOUNTER — Encounter: Admitting: Rheumatology

## 2024-07-12 ENCOUNTER — Other Ambulatory Visit: Payer: Self-pay | Admitting: Nurse Practitioner

## 2024-07-12 NOTE — Telephone Encounter (Signed)
   Discontinued by GI dr

## 2024-07-15 ENCOUNTER — Ambulatory Visit: Admitting: Neurology

## 2024-07-15 ENCOUNTER — Telehealth: Payer: Self-pay | Admitting: Neurology

## 2024-07-15 NOTE — Telephone Encounter (Signed)
 Pt has called and r/s her appointment for today

## 2024-07-15 NOTE — Telephone Encounter (Signed)
 MYC cabcellation

## 2024-07-16 DIAGNOSIS — J4540 Moderate persistent asthma, uncomplicated: Secondary | ICD-10-CM | POA: Diagnosis not present

## 2024-07-17 DIAGNOSIS — I10 Essential (primary) hypertension: Secondary | ICD-10-CM | POA: Diagnosis not present

## 2024-07-17 DIAGNOSIS — I7779 Dissection of other artery: Secondary | ICD-10-CM | POA: Diagnosis not present

## 2024-07-17 DIAGNOSIS — E7800 Pure hypercholesterolemia, unspecified: Secondary | ICD-10-CM | POA: Diagnosis not present

## 2024-07-17 DIAGNOSIS — K55069 Acute infarction of intestine, part and extent unspecified: Secondary | ICD-10-CM | POA: Diagnosis not present

## 2024-07-17 NOTE — Progress Notes (Addendum)
 Subjective   Patient ID:  Barbara Odonnell is a 48 y.o. 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  Clinically she has done fairly well.  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  Minimal if any symptoms. 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  Her vascular surgeon stopped Eliquis  and placed her on aspirin  low-dose.  She decided to take 2 aspirin  instead of 1. Hypertension: Blood pressure has been well-controlled.  She remains vigilant with her medication regimen.  Today's pressure 114/70.  She seems to tolerate her meds well.  Rarely she has hypotension.  She did say that sometimes she wakes up at night and feels bad and her blood pressure is low.  If she exercises a lot in the heat, she will have symptomatic hypotension but it usually resolves.  She remains extremely active. Hyperlipidemia: She eventually could not even tolerate ultralow dose Pravachol.  She says it kept her from sleeping.  She did not really have a lot of muscle pain just the difficulty sleeping. 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  She has no symptoms but has been told she ideally needs CTA or MRA of the head and neck to rule out carotid or intracranial aneurysms. Carotid plaque: No symptoms but the [facility] screening recently reported increased velocity. Oral anticoagulant: Again, she was taken off anticoagulant by her vascular specialist.  She has done well.  Detailed chart review: Early September LDL 143, HDL 67, glucose normal, creatinine 1.01, sodium and potassium normal, calcium  normal and ALT normal.  LP(a) 199 which is quite elevated.   Hemoglobin 16.1 and MCV normal.  Platelet count and white count were normal.  TSH was normal and A1c was normal.  She brought a copy of her recent [facility] screening and it showed mildly elevated velocities in the carotid artery suggesting mild or minimal plaque.   Reviewed and updated this visit by provider: None        Review of Systems  Constitutional:  Negative for activity change, fatigue and unexpected weight change.  Respiratory:  Negative for cough and shortness of breath.   Cardiovascular: Negative.   Gastrointestinal:  Negative for abdominal pain, diarrhea, nausea and vomiting.  Neurological:  Negative for dizziness, syncope and light-headedness.     Objective   Vitals:   07/17/24 1040  BP: 114/70  Pulse: 80  Resp: 17  Height: 5' 7 (1.702 m)  Weight: 160 lb (72.6 kg)  SpO2: 99%  BMI (Calculated): 25.1  PainSc: 0-No pain     Physical Exam Vitals reviewed.  Constitutional:      General: She is not in acute distress.    Appearance: Normal appearance. She is normal weight. She is not ill-appearing.  HENT:     Head: Normocephalic and atraumatic.     Nose: Nose normal.  Eyes:     General: No scleral icterus.    Conjunctiva/sclera: Conjunctivae normal.  Cardiovascular:     Rate and Rhythm: Normal rate and regular rhythm. No extrasystoles are present.    Pulses:          Radial pulses are 2+ on the right side and 2+ on the  left side.     Heart sounds: Normal heart sounds, S1 normal and S2 normal. No murmur heard.    No friction rub.  Musculoskeletal:     Right lower leg: No edema.     Left lower leg: No edema.  Pulmonary:     Effort: Pulmonary effort is normal. No respiratory distress.     Breath sounds: Normal breath sounds. No decreased breath sounds, wheezing, rhonchi or rales.  Abdominal:     General: There is no distension.  Skin:    General: Skin is warm and dry.     Coloration: Skin is not jaundiced or pale.     Findings: No bruising, erythema or  rash.  Neurological:     Mental Status: She is alert and oriented to person, place, and time. Mental status is at baseline.  Psychiatric:        Mood and Affect: Mood normal.        Behavior: Behavior normal.        Thought Content: Thought content normal.        Judgment: Judgment normal.       Assessment and Plan  1. Dissection of mesenteric artery (*) (Primary) -     MRA Angio Head W Contrast; Future -     MRA Angio Neck W Contrast; Future -     evolocumab (REPATHA SURECLICK) 140 mg/mL SOAJ pen injection; Inject 1 mL (140 mg dose) into the skin every 14 (fourteen) days., Starting Wed 07/17/2024, Normal 2. Thrombosis of superior mesenteric artery (*) -     evolocumab (REPATHA SURECLICK) 140 mg/mL SOAJ pen injection; Inject 1 mL (140 mg dose) into the skin every 14 (fourteen) days., Starting Wed 07/17/2024, Normal 3. Primary hypertension 4. Pure hypercholesterolemia -     evolocumab (REPATHA SURECLICK) 140 mg/mL SOAJ pen injection; Inject 1 mL (140 mg dose) into the skin every 14 (fourteen) days., Starting Wed 07/17/2024, Normal 5. Abnormal genetic test 6. Carotid artery plaque, bilateral -     MRA Angio Head W Contrast; Future -     MRA Angio Neck W Contrast; Future -     evolocumab (REPATHA SURECLICK) 140 mg/mL SOAJ pen injection; Inject 1 mL (140 mg dose) into the skin every 14 (fourteen) days., Starting Wed 07/17/2024, Normal 7. Monoallelic mutation of MYH11 gene -     MRA Angio Head W Contrast; Future -     MRA Angio Neck W Contrast; Future -     evolocumab (REPATHA SURECLICK) 140 mg/mL SOAJ pen injection; Inject 1 mL (140 mg dose) into the skin every 14 (fourteen) days., Starting Wed 07/17/2024, Normal    Impression: 1.  History of spontaneous dissection and thrombosis of SMA-stable. 2.  Hypertension well-controlled with current regimen.  Again, her regimen of Coreg  4 times daily is unusual but seems to work for her. 3.  Hyperlipidemia.  Eventually, she was unable to tolerate any  dose of his statin even ultralow dose of pravastatin.  Due to the fact that she has had this mesenteric artery dissection and due to the fact that she has had a carotid artery study that shows mild plaque, and due to the fact that she has had this genetic test that suggest she is at increased risk of aortic dissection and/or aneurysm, I have to think that she would benefit from lipid-lowering.  I think a PCSK9 inhibitor would likely do her some good long-term. 4.  She has had a genetic test that shows this so-called MYH 11  mutation.  Clinically she is doing well.  She has been told by the codirector of the Marfan syndrome and aortopathy clinic at [facility] that she needs MRA or CTA of the head and neck.  I certainly think that is reasonable. 5.  Recent screening brief carotid ultrasound reportedly showed mild plaque.  She is not symptomatic.  Plan: Long discussion with patient.  I recommend Repatha.  She is agreeable.  She needs CMP and lipid panel 2 months after starting Repatha. Due to her history of dissection and thrombosis of the SMA, her mild carotid plaque and her abnormal genetic test suggesting she is at risk of aneurysms, she needs imaging of the brain and neck to rule out intracranial or carotid disease/aneurysm.  I recommend an MRA of the head and neck.  Otherwise, continue current medications. Follow up in about 3 months (around 10/16/2024).    Total time spent with patient and chart: 75 minutes  Patient's Medications  New Prescriptions   EVOLOCUMAB (REPATHA SURECLICK) 140 MG/ML SOAJ PEN INJECTION    Inject 1 mL (140 mg dose) into the skin every 14 (fourteen) days.  Previous Medications   AJOVY  225 MG/1.5ML SOAJ    Inject 225 mg into the skin.   ALBUTEROL  SULFATE HFA (PROVENTIL ,VENTOLIN ,PROAIR ) 108 (90 BASE) MCG/ACT INHALER    Take two puffs by mouth every 6 (six) hours as needed.   AMLODIPINE  BESYLATE (NORVASC ) 5 MG TABLET    TAKE ONE TABLET BY MOUTH DAILY.   APIXABAN  (ELIQUIS ) 5  MG TABLET    Take one tablet (5 mg dose) by mouth 2 (two) times daily.   AYR SALINE NASAL (AYR SALINE NASAL NO-DRIP) GEL GEL    3 sprays by Nasal route 2 (two) times daily.   BUDESONIDE -FORMOTEROL  (SYMBICORT ) 160-4.5 MCG/ACTUATION INHALER    Inhale two puffs into the lungs.   BUPROPION  HCL (WELLBUTRIN  XL) 150 MG 24 HR TABLET    Take one tablet (150 mg dose) by mouth daily.   CARVEDILOL  (COREG ) 12.5 MG TABLET    Take one tablet (12.5 mg dose) by mouth 4 (four) times daily.   CLOBETASOL (TEMOVATE) 0.05 % OINTMENT    Apply topically 2 (two) times daily.   CLOTRIMAZOLE  (LOTRIMIN ) 1% VAGINAL CREAM    Place 1 Applicatorful vaginally at bedtime.   CLOTRIMAZOLE -BETAMETHASONE (LOTRISONE) CREAM    SMARTSIG:Topical Twice Daily PRN   CYCLOBENZAPRINE  (FLEXERIL ) 5 MG TABLET    Take one tablet (5 mg dose) by mouth.   DICLOFENAC  EPOLAMINE 1.3 % PTCH    Place one patch onto the skin.   EPINEPHRINE  (AUVI-Q ,EPIPEN ) 0.3 MG/0.3 ML INJECTION    SMARTSIG:0.3 Milliliter(s) IM As Directed PRN   ESTRADIOL  (ESTRADERM ,ALORA ,VIVELLE -DOT,MINIVELLE ) 0.05 MG/24 HOURS PATCH    Place one patch onto the skin 2 (two) times a week.   FAMOTIDINE  (PEPCID ) 20 MG TABLET    Take one tablet (20 mg dose) by mouth daily.   FLUCONAZOLE  (DIFLUCAN ) 150 MG TABLET    Take 1 tablet by mouth every 3 days for a total of 2 doses   HYDROQUINONE (ELDOQUIN FORTE) 4 % CREAM    APPLY SMALL AMOUNT TO THE FACE ONCE DAILY AS NEEDED   HYOSCYAMINE  SULFATE (LEVSIN /SL) 0.125 MG SL TABLET    Take two tablets (0.25 mg dose) by mouth.   LISINOPRIL (PRINIVIL,ZESTRIL) 5 MG TABLET    Take one tablet (5 mg dose) by mouth daily.   NARATRIPTAN  (AMERGE) 2.5 MG TABLET    TAKE 1 TABLET AS NEEDED FOR MIGRAINE, MAY REPEAT  AFTER 4 HOURS (MAX 5MG  IN 24HRS) *MAX OF 18 IN 30*   NUVARING 0.12-0.015 MG/24HR VAGINAL RING    INSERT 1 RING VAGINALLY EVERY MONTH FOR 21 DAYS   NYSTATIN -TRIAMCINOLONE (MYCOLOG) OINTMENT    Apply one application. topically as needed.   ONDANSETRON   (ZOFRAN -ODT) 4 MG DISINTEGRATING TABLET    DISSOLVE 1 TABLET ON THE TONGUE EVERY 8 HOURS AS NEEDED FOR NAUSEA AND VOMITING   PRAVASTATIN SODIUM (PRAVACHOL) 10 MG TABLET    Take one tablet (10 mg dose) by mouth at bedtime.   PROGESTERONE (PROMETRIUM) 100 MG CAPSULE    Take one capsule (100 mg dose) by mouth daily.   RIZATRIPTAN  (MAXALT ) 10 MG TABLET    Take one tablet (10 mg dose) by mouth.   SODIUM FLUORIDE (CLINPRO 5000) 1.1 % PSTE DENTAL PASTE    USE A PEA SIZE AMOUNT TO BRUSH FOR SPIT OUT EXCESS   SOLIFENACIN SUCCINATE (VESICARE) 5 MG TABLET    Take one tablet (5 mg dose) by mouth daily.   SYNTHROID  50 MCG TABLET    Take one tablet (50 mcg dose) by mouth daily.   TRIAMCINOLONE ACETONIDE (KENALOG) 0.1% CREAM    Apply one g (1 Application dose) topically 2 (two) times daily.   VALACYCLOVIR  (VALTREX ) 500 MG TABLET    TAKE 1 TABLET BY MOUTH EVERY DAY **MYLAN BRAND**  Modified Medications   No medications on file  Discontinued Medications   No medications on file      Risks, benefits, and alternatives of the medications and treatment plan prescribed today were discussed, and patient expressed understanding. Plan follow-up as discussed or as needed if any worsening symptoms or change in condition.   A yearly preventative health exam was recommended and current age based recommendations were discussed.

## 2024-07-19 DIAGNOSIS — Z1331 Encounter for screening for depression: Secondary | ICD-10-CM | POA: Diagnosis not present

## 2024-07-19 DIAGNOSIS — I7779 Dissection of other artery: Secondary | ICD-10-CM | POA: Diagnosis not present

## 2024-07-19 NOTE — Progress Notes (Signed)
 Office Visit Note  Patient: Barbara Odonnell             Date of Birth: 25-Jul-1976           MRN: [id number]             PCP: Bulah Alm RAMAN, PA-C Referring: Bulah Alm RAMAN, PA-C Visit Date: 08/02/2024 Occupation: Therapist  Subjective:  Raynauds  History of Present Illness: Barbara Odonnell is a 48 y.o. female seen today for evaluation of possible autoimmune disease.  She was evaluated by me in November 2020.  At that visit we discussed Raynauds, osteopenia and history of fractures.  Labs were also obtained all the labs are within normal limits except ANA was only 1: 40 titer. Patient states in January 2025 she woke up 1 day with severe abdominal pain and left flank pain.  She was initially seen by the urologist and the workup was negative.  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  She was hospitalized with elevated blood pressure.  Patient recalls being in ICU and was treated for blood pressure and was given heparin  drip.  She was discharged on Eliquis  which she took until June 2025 followed by aspirin  81 mg 2 tablets a day.  She is closely followed by vascular surgery and cardiology.  She is on combination of carvedilol , Norvasc  and lisinopril for control of her blood pressure.  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  She did not have EDS variant.  They recommended repeat echocardiogram yearly and MRA of the brain and the cervical spine.  She will have a follow-up with them in 3 years to see if they have new she is to test.  She has been followed by cardiology on a regular basis now.  She states she had recent eye exam which was normal.  She states she bruises easily.  She is also mentioned that she had large ascending aorta.  She has had frequent epigastric and umbilical hernia.  She has been followed by Dr. Harvey sports medicine.  She has been doing limited  isometric exercises.  She also has tried doing spin classes and Pilates.  She continues to have Raynaud's phenomenon.  She denies any history of oral ulcers, nasal ulcers, malar rash, oral sensitivity or Raynaud's phenomenon.  There is no history of inflammatory arthritis. .  She had osteopenia and stress fractures in the past but no recent fractures.  Last stress fracture was in 2008 which was right femur fracture.  There is family history of osteoporosis.  Her mother is on Prolia subcutaneous injections. 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  She is right-handed, single, gravida 0, para 0.  Birth control method is NuvaRing.  She drinks alcohol occasionally about once a month.  She has never been a smoker.    Activities of Daily Living:  Patient reports morning stiffness for 0 minutes.   Patient Denies nocturnal pain.  Difficulty dressing/grooming: Denies Difficulty climbing stairs: Denies Difficulty getting out of chair: Denies Difficulty using hands for taps, buttons, cutlery, and/or writing: Denies  Review of Systems  Constitutional:  Positive for fatigue.  HENT:  Negative for mouth sores and mouth dryness.   Eyes:  Negative for dryness.  Respiratory:  Negative for shortness of breath.   Cardiovascular:  Positive for swelling in legs/feet. Negative for chest pain  and palpitations.       Chest discomfort in mornings   Gastrointestinal:  Negative for blood in stool, constipation and diarrhea.  Endocrine: Negative for increased urination.  Genitourinary:  Positive for involuntary urination.  Musculoskeletal:  Negative for joint pain, gait problem, joint pain, joint swelling, myalgias, muscle weakness, morning stiffness, muscle tenderness and myalgias.  Skin:  Positive for color change and hair loss. Negative for rash and sensitivity to sunlight.  Allergic/Immunologic: Negative for susceptible to infections.  Neurological:  Positive for numbness and headaches.  Negative for dizziness.  Hematological:  Positive for swollen glands.  Psychiatric/Behavioral:  Negative for depressed mood and sleep disturbance. The patient is not nervous/anxious.     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   Chronic migraine without aura, with intractable migraine, so stated, with status migrainosus 06/26/2023   Food allergy  03/24/2022   Chronic nausea 06/02/2021   Gastroesophageal reflux disease 06/02/2021   Moderate persistent asthma 06/02/2021   History of COVID-19 03/16/2021   Hair loss 03/16/2021   History of hip fracture 09/15/2020   Encounter for health maintenance examination in adult 09/15/2020   Screening for lipid disorders 09/15/2020   Hives 09/15/2020   History of Epstein-Barr virus infection 01/08/2020   Chronic fatigue 10/23/2019   Raynaud's phenomenon without gangrene 09/24/2019   Pruritic condition 09/24/2019   Hx of colonic polyp 09/13/2019   Vaccine counseling 03/11/2019   History of colitis 03/11/2019   Allergic rhinitis due to pollen 02/04/2019   Family history of osteoporosis 11/28/2017   History of stress fracture 11/28/2017   Hypothyroidism 10/08/2015   Chronic migraine without aura without status migrainosus, not intractable 10/08/2015   Celiac disease 10/08/2015   Vitamin D  deficiency 10/08/2015   Iron deficiency anemia 10/08/2015    Past Medical History:  Diagnosis Date    Allergy     Anemia    IV iron in past, prior to 2018   Asthma    Celiac disease    +prior biopsy   GERD (gastroesophageal reflux disease)    H/O bone density study 12/21/2017   normal   Hip fracture (HCC) 2008   right, Dr. Harvey   History of stress test 2014   [facility]   Hypothyroidism    Migraine headache without aura    gets bad nausea.  does well on Naratriptan  and Relpax.  Failed Imitrex , Maxalt    Osteopenia    Stress fracture    severeral prior   Transfusion history    has had IV iron prior, Dr. Timmy prior management   Ulcer    previous ulcer with benign polyp removed    Family History  Problem Relation Age of Onset   Hypertension Mother    Osteopenia Mother    Raynaud syndrome Mother    Celiac disease Mother    Cancer Father        asbestos related lung cancer   Heart disease Father 33  Stent   Pulmonary embolism Father    Celiac disease Brother    Gout Brother    Gout Brother    Celiac disease Brother    Stroke Maternal Grandmother    COPD Maternal Grandmother    Osteopenia Maternal Grandmother    Heart disease Maternal Grandfather    Stroke Maternal Grandfather    Heart disease Paternal Grandfather        died 42yo   Diabetes Neg Hx    Past Surgical History:  Procedure Laterality Date   COLONOSCOPY  2015   [facility], advised to repeat q5 years; x 4 prior   COLONOSCOPY N/A 04/18/2024   Procedure: COLONOSCOPY;  Surgeon: Avram Lupita BRAVO, MD;  Location: [facility];  Service: Gastroenterology;  Laterality: N/A;  Intolerant of Fentanyl  and Propofol    COSMETIC SURGERY     cheek implants and nasal surgery   ESOPHAGOGASTRODUODENOSCOPY     x5 prior as of 2016   ESOPHAGOGASTRODUODENOSCOPY N/A 04/18/2024   Procedure: EGD (ESOPHAGOGASTRODUODENOSCOPY);  Surgeon: Avram Lupita BRAVO, MD;  Location: [facility];  Service: Gastroenterology;  Laterality: N/A;  Intolerant of Fentanyl  and Propofol    INGUINAL HERNIA REPAIR     left   NASAL SEPTUM SURGERY      POLYPECTOMY     sigmoid colon   SINOSCOPY     WISDOM TOOTH EXTRACTION     Social History   Tobacco Use   Smoking status: Never    Passive exposure: Never   Smokeless tobacco: Never  Vaping Use   Vaping status: Never Used  Substance Use Topics   Alcohol use: Yes    Alcohol/week: 2.0 standard drinks of alcohol    Types: 1 Glasses of wine, 1 Shots of liquor per week    Comment: rare   Drug use: No   Social History   Social History Narrative   Lives alone, 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   Prior worked as professor at [employer].  Exercise:- 6-7 days per week, teaches spin at [employer], swam competitively in college, marathon runner.       [facility]:   Originally from [place]. Has also lived in [place], [place], & [place].  Currently working in counseling. No pets currently. No bird exposure. No mold or hot tub exposure. Currently has carpet in the bedroom. No draperies. No exposure to asbestos.        Immunization History  Administered Date(s) Administered   DTaP 07/01/2010   HPV Quadrivalent 10/31/2006, 10/31/2006   Hep B, Unspecified 03/15/1991, 04/12/1991, 11/18/1991, 04/30/1992   Hpv-Unspecified 10/31/2006, 01/18/2008, 03/11/2008, 07/11/2008   Influenza Split 09/17/2012, 07/26/2013   Influenza, Mdck, Trivalent,PF 6+ MOS(egg free) 07/02/2024   Influenza, Seasonal, Injecte, Preservative Fre 07/16/2013, 10/31/2013   Influenza,inj,Quad PF,6+ Mos 11/24/2016, 10/28/2018, 09/24/2019, 09/15/2020, 07/28/2022   Influenza-Unspecified 10/04/2015, 09/04/2023   MMR 05/13/1988   Measles [birth year]1974   Mumps 09/17/1976   PFIZER(Purple Top)SARS-COV-2 Vaccination 11/20/2019, 12/11/2019, 06/21/2020   Pneumococcal Polysaccharide-23 03/24/2022   Polio, Unspecified 01/16/1974, 02/16/1974, 03/27/1974, 03/07/1975, 04/27/1978   Rubella 03/30/1977   Td (Adult),unspecified 05/25/1984, 12/12/1997   Tdap  02/12/2008, 09/15/2020     Objective: Vital Signs: BP 107/76 (BP Location: Right Arm, Patient Position: Sitting, Cuff Size: Normal)   Pulse (!) 59   Temp 98 F (36.7 C)   Resp 16   Ht 5' 6.5 (1.689 m)   Wt 160 lb (72.6 kg)   BMI 25.44 kg/m    Physical Exam  Vitals and nursing note reviewed.  Constitutional:      Appearance: She is well-developed.  HENT:     Head: Normocephalic and atraumatic.  Eyes:     Conjunctiva/sclera: Conjunctivae normal.  Cardiovascular:     Rate and Rhythm: Normal rate and regular rhythm.     Heart sounds: Normal heart sounds.  Pulmonary:     Effort: Pulmonary effort is normal.     Breath sounds: Normal breath sounds.  Abdominal:     General: Bowel sounds are normal.     Palpations: Abdomen is soft.  Musculoskeletal:     Cervical back: Normal range of motion.  Lymphadenopathy:     Cervical: No cervical adenopathy.  Skin:    General: Skin is warm and dry.     Capillary Refill: Capillary refill takes less than 2 seconds.  Neurological:     Mental Status: She is alert and oriented to person, place, and time.  Psychiatric:        Behavior: Behavior normal.      Musculoskeletal Exam: Cervical, thoracic and lumbar spine were in good range of motion.  There was no SI joint tenderness.  Shoulder joints, elbow joints, wrist joints, MCPs, PIPs and DIPs were in good range of motion with no synovitis.  Hip joints and knee joints were in good range of motion without any warmth swelling or effusion.  There was no tenderness over ankles or MTPs.   CDAI Exam: CDAI Score: -- Patient Global: --; Provider Global: -- Swollen: --; Tender: -- Joint Exam 08/02/2024   No joint exam has been documented for this visit   There is currently no information documented on the homunculus. Go to the Rheumatology activity and complete the homunculus joint exam.  Investigation: No additional findings.  Imaging: No results found.  Recent Labs: Lab Results   Component Value Date   WBC 9.2 06/18/2024   HGB 14.8 06/18/2024   PLT 281 06/18/2024   NA 136 06/18/2024   K 4.5 06/18/2024   CL 99 06/18/2024   CO2 17 (L) 06/18/2024   GLUCOSE 95 06/18/2024   BUN 10 06/18/2024   CREATININE 0.92 06/18/2024   BILITOT 0.5 01/02/2024   ALKPHOS 32 (L) 01/02/2024   AST 16 01/02/2024   ALT 11 04/01/2024   PROT 7.2 01/02/2024   ALBUMIN 4.2 06/18/2024   CALCIUM  9.5 06/18/2024   GFRAA 86 12/18/2020   09/13/19 ANA 1:40 NS, ENA( Scl70, RNP,Sm, SSA, SSB, ds DNA) negative, b2 GP1 neg, aCL neg, LA neg,,CK 114, sed rate 2,C3 and C4 normal, SPEP normal, PTH 81, P 2.9 09/24/19 Hep B and Hep C negative  IMPRESSION: 1. No acute aortic syndrome. 2. No acute abnormality in the chest, abdomen, or pelvis. 3. 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  Electronically Signed   By: Norman Gatlin M.D.   On: 01/03/2024 01:46  IMPRESSION: 1. As noted on the exam from earlier today there are signs of dissection involving the superior mesenteric artery with areas of mural thrombosis or thrombus within the false lumen resulting in multifocal areas of short segment luminal narrowing. There is contrast opacification of the distal branches of the superior mesenteric artery. Additionally, the celiac artery and its branches as well as the inferior mesenteric artery are patent and well opacified. Differential considerations include vasculitides, fibromuscular dysplasia, or connective  tissue disorders. Clinical correlation is advised. 2. No signs of bowel ischemia or obstruction.  Electronically Signed   By: Waddell Calk M.D.   On: 12/01/2023 15:21  IMPRESSION: 1. As noted on the exam from earlier today there are signs of dissection involving the superior mesenteric artery with areas of mural  thrombosis or thrombus within the false lumen resulting in multifocal areas of short segment luminal narrowing. There is contrast opacification of the distal branches of the superior mesenteric artery. Additionally, the celiac artery and its branches as well as the inferior mesenteric artery are patent and well opacified. Differential considerations include vasculitides, fibromuscular dysplasia, or connective tissue disorders. Clinical correlation is advised. 2. No signs of bowel ischemia or obstruction.  Electronically Signed   By: Waddell Calk M.D.   On: 12/01/2023 15:21  Speciality Comments: No specialty comments available.  Procedures:  No procedures performed Allergies: Sulfa antibiotics, Ciprofloxacin, Fentanyl , Gluten meal, and Singulair  [montelukast  sodium]   Assessment / Plan:     Visit Diagnoses: Raynaud's phenomenon without gangrene -patient was evaluated in 2020 for Raynaud's phenomenon.  At that time autoimmune workup was negative.  She states she continues to have Raynaud's symptoms especially during the winter months.  There is no history of digital ulcers.  She denies any history of oral ulcers, nasal ulcers, malar rash, photosensitivity, lymphadenopathy, inflammatory arthritis.  Will recheck labs today.  Plan: Sedimentation rate, ANA, Anti-scleroderma antibody, RNP Antibody, Anti-Smith antibody, Sjogrens syndrome-A extractable nuclear antibody, Sjogrens syndrome-B extractable nuclear antibody, Anti-DNA antibody, double-stranded, C3 and C4, Beta-2 glycoprotein antibodies, Cardiolipin antibodies, IgG, IgM, IgA, Lupus Anticoagulant Eval w/Reflex  Other idiopathic scoliosis, thoracolumbar region-she has scoliosis.  Hypermobility of joint-mild hypermobility in her joints and laxity of the skin was noted.  [facility].  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  She gradually resumed exercises.  She may benefit by seeing Dr.  Joane at the sports medicine.  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  She is closely followed by vascular surgery.  She is on aspirin  81 mg 2 tablets now.  Celiac disease-she has been on gluten-free diet for many years.  She is also vegetarian.  Age-related osteoporosis without current pathological fracture - 05/12/23 The BMD measured at Femur Neck Left is 0.849 g/cm2 with a T-score of -1.4.  With 3 stress fractures in the past.- Plan: Parathyroid  hormone, intact (no Ca), VITAMIN D  25 Hydroxy (Vit-D Deficiency, Fractures), Phosphorus, TSH, Serum protein electrophoresis with reflex.  I did detailed discussion with the patient regarding different treatment options.  Benefits of using an anabolic agent prior to bisphosphonates to increase the bone quality was discussed.  Patient would like to try an anabolic agent.  We will apply for Tymlos/Forteo.  There is no history of radiation therapy or bone cancer.  She has been on Synthroid  and omeprazole  for many years.  She also has celiac disease and remains on vegetarian diet.  History of hip fracture -right femoral head 2008  History of stress fracture-right and left tibia during college.  Vitamin D  deficiency-patient had vitamin D  deficiency in the past.  Last vitamin D  was 50.2 in January 2025.  Other iron deficiency anemia  Other specified hypothyroidism-she is on Synthroid .  Elevated LDL cholesterol level-she could not tolerate the statins and will be starting Repatha.  Statin intolerance -  She will be starting Repatha.  Other medical problems are listed as follows:  Chronic migraine without aura, with intractable migraine, so stated, with status migrainosus  Moderate persistent asthma without complication  Gastroesophageal reflux disease without esophagitis  Hx of colonic polyp  Family history of  osteoporosis- mother, MGM  Orders: Orders Placed This Encounter  Procedures   Sedimentation rate   ANA   Anti-scleroderma antibody   RNP Antibody   Anti-Smith antibody   Sjogrens syndrome-A extractable nuclear antibody   Sjogrens syndrome-B extractable nuclear antibody   Anti-DNA antibody, double-stranded   C3 and C4   Beta-2 glycoprotein antibodies   Cardiolipin antibodies, IgG, IgM, IgA   Lupus Anticoagulant Eval w/Reflex   Parathyroid  hormone, intact (no Ca)   VITAMIN D  25 Hydroxy (Vit-D Deficiency, Fractures)   Phosphorus   TSH   Serum protein electrophoresis with reflex   No orders of the defined types were placed in this encounter.    Follow-Up Instructions: Return for Raynauds, Osteoporosis.   Maya Nash, MD  Note - This record has been created using Animal nutritionist.  Chart creation errors have been sought, but may not always  have been located. Such creation errors do not reflect on  the standard of medical care.

## 2024-07-24 ENCOUNTER — Other Ambulatory Visit: Payer: Self-pay | Admitting: Medical

## 2024-07-24 ENCOUNTER — Other Ambulatory Visit

## 2024-07-24 NOTE — Telephone Encounter (Signed)
 Pt is scheduled for February 2026

## 2024-07-26 DIAGNOSIS — I7779 Dissection of other artery: Secondary | ICD-10-CM | POA: Diagnosis not present

## 2024-07-26 DIAGNOSIS — I6523 Occlusion and stenosis of bilateral carotid arteries: Secondary | ICD-10-CM | POA: Diagnosis not present

## 2024-07-31 ENCOUNTER — Ambulatory Visit: Admitting: Rheumatology

## 2024-08-01 ENCOUNTER — Telehealth: Payer: Self-pay

## 2024-08-01 NOTE — Telephone Encounter (Signed)
 We received a call from Dr Garnette Salt on behalf of Ms Mckeon. He was reaching out to inquire about Dr Ines leaving the practice and where she may be heading next. He is wanting to speak with someone regarding this as he is unsure if Ms Villalona would like to continue with our practice under another provider or if she wishes to follow Dr Ines. He is asking to be called back at [phone]  Dr Vear, you are work in this afternoon so routing to you

## 2024-08-02 ENCOUNTER — Encounter: Payer: Self-pay | Admitting: Rheumatology

## 2024-08-02 ENCOUNTER — Telehealth: Payer: Self-pay

## 2024-08-02 ENCOUNTER — Ambulatory Visit: Payer: Self-pay | Attending: Rheumatology | Admitting: Rheumatology

## 2024-08-02 VITALS — BP 107/76 | HR 59 | Temp 98.0°F | Resp 16 | Ht 66.5 in | Wt 160.0 lb

## 2024-08-02 DIAGNOSIS — Z860100 Personal history of colon polyps, unspecified: Secondary | ICD-10-CM

## 2024-08-02 DIAGNOSIS — Z8262 Family history of osteoporosis: Secondary | ICD-10-CM

## 2024-08-02 DIAGNOSIS — Z8781 Personal history of (healed) traumatic fracture: Secondary | ICD-10-CM

## 2024-08-02 DIAGNOSIS — Z87312 Personal history of (healed) stress fracture: Secondary | ICD-10-CM

## 2024-08-02 DIAGNOSIS — Z789 Other specified health status: Secondary | ICD-10-CM

## 2024-08-02 DIAGNOSIS — E7800 Pure hypercholesterolemia, unspecified: Secondary | ICD-10-CM

## 2024-08-02 DIAGNOSIS — E559 Vitamin D deficiency, unspecified: Secondary | ICD-10-CM

## 2024-08-02 DIAGNOSIS — K55069 Acute infarction of intestine, part and extent unspecified: Secondary | ICD-10-CM

## 2024-08-02 DIAGNOSIS — K900 Celiac disease: Secondary | ICD-10-CM

## 2024-08-02 DIAGNOSIS — M249 Joint derangement, unspecified: Secondary | ICD-10-CM

## 2024-08-02 DIAGNOSIS — J4540 Moderate persistent asthma, uncomplicated: Secondary | ICD-10-CM

## 2024-08-02 DIAGNOSIS — E038 Other specified hypothyroidism: Secondary | ICD-10-CM

## 2024-08-02 DIAGNOSIS — I7300 Raynaud's syndrome without gangrene: Secondary | ICD-10-CM | POA: Diagnosis not present

## 2024-08-02 DIAGNOSIS — M810 Age-related osteoporosis without current pathological fracture: Secondary | ICD-10-CM

## 2024-08-02 DIAGNOSIS — D508 Other iron deficiency anemias: Secondary | ICD-10-CM

## 2024-08-02 DIAGNOSIS — M8589 Other specified disorders of bone density and structure, multiple sites: Secondary | ICD-10-CM | POA: Diagnosis not present

## 2024-08-02 DIAGNOSIS — G43711 Chronic migraine without aura, intractable, with status migrainosus: Secondary | ICD-10-CM

## 2024-08-02 DIAGNOSIS — K551 Chronic vascular disorders of intestine: Secondary | ICD-10-CM

## 2024-08-02 DIAGNOSIS — I7779 Dissection of other artery: Secondary | ICD-10-CM

## 2024-08-02 DIAGNOSIS — G43709 Chronic migraine without aura, not intractable, without status migrainosus: Secondary | ICD-10-CM

## 2024-08-02 DIAGNOSIS — E039 Hypothyroidism, unspecified: Secondary | ICD-10-CM | POA: Diagnosis not present

## 2024-08-02 DIAGNOSIS — M4125 Other idiopathic scoliosis, thoracolumbar region: Secondary | ICD-10-CM

## 2024-08-02 DIAGNOSIS — K219 Gastro-esophageal reflux disease without esophagitis: Secondary | ICD-10-CM

## 2024-08-02 NOTE — Telephone Encounter (Signed)
 Per Dr. Dolphus, pt will be new start for Tymlos/Forteo for Osteoporosis with recurrent fractures.  Submitted a Prior Authorization request to [facility] for TYMLOS via CoverMyMeds. Will update once we receive a response.  Key: [id number]

## 2024-08-04 ENCOUNTER — Ambulatory Visit: Payer: Self-pay | Admitting: Rheumatology

## 2024-08-04 NOTE — Progress Notes (Signed)
 ANA is low titer positive, all other labs are within normal limits.  Few tests are still pending.  I will discuss results at the follow-up visit.

## 2024-08-05 ENCOUNTER — Other Ambulatory Visit (HOSPITAL_COMMUNITY): Payer: Self-pay

## 2024-08-05 DIAGNOSIS — E538 Deficiency of other specified B group vitamins: Secondary | ICD-10-CM | POA: Diagnosis not present

## 2024-08-05 DIAGNOSIS — M85852 Other specified disorders of bone density and structure, left thigh: Secondary | ICD-10-CM | POA: Diagnosis not present

## 2024-08-05 DIAGNOSIS — E039 Hypothyroidism, unspecified: Secondary | ICD-10-CM | POA: Diagnosis not present

## 2024-08-05 DIAGNOSIS — I10 Essential (primary) hypertension: Secondary | ICD-10-CM | POA: Diagnosis not present

## 2024-08-05 DIAGNOSIS — E559 Vitamin D deficiency, unspecified: Secondary | ICD-10-CM | POA: Diagnosis not present

## 2024-08-05 DIAGNOSIS — K219 Gastro-esophageal reflux disease without esophagitis: Secondary | ICD-10-CM | POA: Diagnosis not present

## 2024-08-05 NOTE — Telephone Encounter (Signed)
 Received notification from [facility] regarding a prior authorization for TYMLOS. Authorization has been APPROVED from 08/02/2024 to 08/02/2026. Approval letter has not yet been received but will be sent to scan center once obtained.  Per test claim, copay for 30 days supply is $0.00  Patient can fill through [facility]: [phone]   Authorization # [id number]   Will route to [facility] for clinical f/u.

## 2024-08-07 ENCOUNTER — Other Ambulatory Visit (HOSPITAL_COMMUNITY): Payer: Self-pay

## 2024-08-07 ENCOUNTER — Other Ambulatory Visit: Payer: Self-pay

## 2024-08-07 ENCOUNTER — Ambulatory Visit: Admitting: Neurology

## 2024-08-07 MED ORDER — TYMLOS 3120 MCG/1.56ML ~~LOC~~ SOPN
80.0000 ug | PEN_INJECTOR | Freq: Every day | SUBCUTANEOUS | 5 refills | Status: AC
  Filled 2024-08-07: qty 1.56, fill #0
  Filled 2024-08-07: qty 1.56, 30d supply, fill #0
  Filled 2024-08-29 – 2024-08-30 (×2): qty 1.56, 30d supply, fill #1
  Filled 2024-09-25 – 2024-10-04 (×3): qty 1.56, 30d supply, fill #2
  Filled 2024-11-07 – 2024-11-11 (×2): qty 1.56, 30d supply, fill #3

## 2024-08-07 MED ORDER — INSULIN PEN NEEDLE 31G X 5 MM MISC
2 refills | Status: AC
  Filled 2024-08-07 (×2): qty 100, 90d supply, fill #0
  Filled 2024-08-07: qty 100, fill #0
  Filled 2024-11-07: qty 100, 90d supply, fill #1

## 2024-08-07 NOTE — Progress Notes (Signed)
 ANA is low titer positive which is not significant.  All other labs are within normal limits.  Will discuss results at the follow-up visit.

## 2024-08-07 NOTE — Progress Notes (Signed)
 Office Visit Note  Patient: Barbara Odonnell             Date of Birth: December 04, 1975           MRN: [id number]             PCP: Bulah Alm RAMAN, PA-C Referring: Bulah Alm RAMAN, PA-C Visit Date: 08/21/2024 Occupation: Therapist  Subjective:  Medication Management   History of Present Illness: Barbara Odonnell is a 48 y.o. female.  Send osteoporosis.  She returns today after her initial visit on August 02, 2024.  She wanted to learn the technique to use Tymlos.  She has been taking calcium  and vitamin D .  Patient was evaluated by Dr. Margart who diagnosed her with hypermobility EDS variant.  She is closely followed by cardiology.  She states her Raynauds symptoms are manageable with keeping core temperature warm and warm clothing.  She has been exercising on a regular basis.  She denies any history of oral ulcers, nasal ulcers, malar rash, photosensitivity, lymphadenopathy or inflammatory arthritis.    Activities of Daily Living:  Patient reports morning stiffness for 0 none.   Patient Denies nocturnal pain.  Difficulty dressing/grooming: Denies Difficulty climbing stairs: Denies Difficulty getting out of chair: Denies Difficulty using hands for taps, buttons, cutlery, and/or writing: Denies  Review of Systems  Constitutional:  Negative for fatigue.  HENT:  Negative for mouth sores and mouth dryness.   Eyes:  Negative for dryness.  Respiratory:  Negative for shortness of breath.   Cardiovascular:  Negative for chest pain and palpitations.  Gastrointestinal:  Negative for blood in stool, constipation and diarrhea.  Endocrine: Negative for increased urination.  Genitourinary:  Positive for involuntary urination.  Musculoskeletal:  Negative for joint pain, gait problem, joint pain, joint swelling, myalgias, muscle weakness, morning stiffness, muscle tenderness and myalgias.  Skin:  Positive for rash and hair loss. Negative for color change and sensitivity to sunlight.   Allergic/Immunologic: Positive for susceptible to infections.  Neurological:  Positive for headaches. Negative for dizziness.  Hematological:  Negative for swollen glands.  Psychiatric/Behavioral:  Negative for depressed mood and sleep disturbance. The patient is not nervous/anxious.     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/25/2024   Superior mesenteric artery stenosis 01/25/2024   Abdominal pain of multiple sites 01/25/2024   Blood in stool 01/25/2024   Other secondary hypertension 01/25/2024   Abnormal serum creatinine level 01/25/2024   Elevated LDL cholesterol level 12/22/2023   Anticoagulated 12/18/2023   Ehlers-Danlos syndrome 12/18/2023   Hypertensive emergency 12/03/2023   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   Chronic migraine without aura, with intractable migraine, so stated, with status migrainosus 06/26/2023   Food allergy  03/24/2022   Chronic nausea 06/02/2021   Gastroesophageal reflux disease 06/02/2021   Moderate persistent asthma 06/02/2021   History of COVID-19 03/16/2021   Hair loss 03/16/2021   History of hip fracture 09/15/2020   Encounter for health maintenance examination in adult 09/15/2020   Screening for lipid disorders 09/15/2020   Hives 09/15/2020   History of Epstein-Barr virus infection 01/08/2020   Chronic fatigue 10/23/2019   Raynaud's phenomenon without gangrene 09/24/2019   Pruritic condition 09/24/2019   Hx of colonic polyp 09/13/2019   Vaccine counseling 03/11/2019   History of  colitis 03/11/2019   Allergic rhinitis due to pollen 02/04/2019   Family history of osteoporosis 11/28/2017   History of stress fracture 11/28/2017   Hypothyroidism 10/08/2015   Chronic migraine without aura without status migrainosus, not intractable  10/08/2015   Celiac disease 10/08/2015   Vitamin D  deficiency 10/08/2015   Iron deficiency anemia 10/08/2015    Past Medical History:  Diagnosis Date   Allergy     Anemia    IV iron in past, prior to 2018   Asthma    Celiac disease    +prior biopsy   GERD (gastroesophageal reflux disease)    H/O bone density study 12/21/2017   normal   Hip fracture (HCC) 2008   right, Dr. Harvey   History of stress test 2014   [facility]   Hyperlipidemia    Hypertension    Hypothyroidism    Migraine headache without aura    gets bad nausea.  does well on Naratriptan  and Relpax.  Failed Imitrex , Maxalt    Osteopenia    Stress fracture    severeral prior   Transfusion history    has had IV iron prior, Dr. Timmy prior management   Ulcer    previous ulcer with benign polyp removed    Family History  Problem Relation Age of Onset   Hypertension Mother    Osteopenia Mother    Raynaud syndrome Mother    Celiac disease Mother    Cancer Father        asbestos related lung cancer   Heart disease Father 71       Stent   Pulmonary embolism Father    Stroke Father    Celiac disease Brother    Gout Brother    Gout Brother    Celiac disease Brother    Stroke Maternal Grandmother    COPD Maternal Grandmother    Osteopenia Maternal Grandmother    Heart disease Maternal Grandfather    Stroke Maternal Grandfather    Heart disease Paternal Grandfather        died 42yo   Diabetes Neg Hx    Past Surgical History:  Procedure Laterality Date   COLONOSCOPY  2015   [facility], advised to repeat q5 years; x 4 prior   COLONOSCOPY N/A 04/18/2024   Procedure: COLONOSCOPY;  Surgeon: Avram Lupita BRAVO, MD;  Location: [facility];  Service: Gastroenterology;  Laterality: N/A;  Intolerant of Fentanyl  and Propofol    COSMETIC SURGERY     cheek implants and nasal surgery   ESOPHAGOGASTRODUODENOSCOPY     x5 prior as of 2016   ESOPHAGOGASTRODUODENOSCOPY N/A 04/18/2024   Procedure: EGD  (ESOPHAGOGASTRODUODENOSCOPY);  Surgeon: Avram Lupita BRAVO, MD;  Location: [facility];  Service: Gastroenterology;  Laterality: N/A;  Intolerant of Fentanyl  and Propofol    INGUINAL HERNIA REPAIR     left   NASAL SEPTUM SURGERY     POLYPECTOMY     sigmoid colon   SINOSCOPY     WISDOM TOOTH EXTRACTION     Social History   Tobacco Use   Smoking status: Never    Passive exposure: Past   Smokeless tobacco: Never  Vaping Use   Vaping status: Never Used  Substance Use Topics   Alcohol use: Yes    Alcohol/week: 1.0 standard drink of alcohol    Types: 1 Glasses of wine per week    Comment: rare   Drug use: No   Social History   Social History Narrative   Lives alone, 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   Prior worked as professor at [employer].  Exercise:- 6-7 days per week, teaches spin at [employer], swam competitively in college, marathon runner.       [facility]:   Originally from [place]. Has also lived in [place], [place], & [place].  Currently working in counseling. No pets currently. No bird exposure. No mold or hot tub exposure. Currently has carpet in the bedroom. No draperies. No exposure to asbestos.        Immunization History  Administered Date(s) Administered   DTaP 07/01/2010   HPV Quadrivalent 10/31/2006, 10/31/2006   Hep B, Unspecified 03/15/1991, 04/12/1991, 11/18/1991, 04/30/1992   Hpv-Unspecified 10/31/2006, 01/18/2008, 03/11/2008, 07/11/2008   Influenza Split 09/17/2012, 07/26/2013   Influenza, Mdck, Trivalent,PF 6+ MOS(egg free) 07/02/2024   Influenza, Seasonal, Injecte, Preservative Fre 07/16/2013, 10/31/2013   Influenza,inj,Quad PF,6+ Mos 11/24/2016, 10/28/2018, 09/24/2019, 09/15/2020, 07/28/2022   Influenza-Unspecified 10/04/2015, 09/04/2023   MMR 05/13/1988   Measles 11/22/1974   Mumps 09/17/1976   PFIZER(Purple Top)SARS-COV-2 Vaccination 11/20/2019, 12/11/2019, 06/21/2020    PNEUMOCOCCAL CONJUGATE-20 08/06/2024   Pneumococcal Polysaccharide-23 03/24/2022   Polio, Unspecified 01/16/1974, 02/16/1974, 03/27/1974, 03/07/1975, 04/27/1978   Rubella 03/30/1977   Td (Adult),unspecified 05/25/1984, 12/12/1997   Tdap 02/12/2008, 09/15/2020, 08/18/2024     Objective: Vital Signs: BP 105/70 (BP Location: Right Arm, Patient Position: Sitting, Cuff Size: Normal)   Pulse 76   Temp 97.8 F (36.6 C)   Resp 15   Ht 5' 6.5 (1.689 m)   Wt 160 lb (72.6 kg)   LMP  (LMP Unknown)   BMI 25.44 kg/m    Physical Exam Vitals and nursing note reviewed.  Constitutional:      Appearance: She is well-developed.  HENT:     Head: Normocephalic and atraumatic.  Eyes:     Conjunctiva/sclera: Conjunctivae normal.  Cardiovascular:     Rate and Rhythm: Normal rate and regular rhythm.     Heart sounds: Normal heart sounds.  Pulmonary:     Effort: Pulmonary effort is normal.     Breath sounds: Normal breath sounds.  Abdominal:     General: Bowel sounds are normal.     Palpations: Abdomen is soft.  Musculoskeletal:     Cervical back: Normal range of motion.  Lymphadenopathy:     Cervical: No cervical adenopathy.  Skin:    General: Skin is warm and dry.     Capillary Refill: Capillary refill takes less than 2 seconds.  Neurological:     Mental Status: She is alert and oriented to person, place, and time.  Psychiatric:        Behavior: Behavior normal.      Musculoskeletal Exam: Cervical, thoracic and lumbar spine were in good range of motion.  There was no SI joint tenderness.  Shoulder joints, elbow joints, wrist joints, MCPs, PIPs and DIPs were in good range of motion with no synovitis.  Hip joints and knee joints were in good range of motion without any warmth swelling or effusion.  There was no tenderness over ankles or MTPs.  Mild hypermobility was noted in her joints.  CDAI Exam: CDAI Score: -- Patient Global: --; Provider Global: -- Swollen: --; Tender: -- Joint  Exam 08/21/2024   No joint exam has been documented for this visit   There is currently no information documented on the homunculus. Go to the Rheumatology activity and complete the homunculus joint exam.  Investigation: No additional findings.  Imaging: No results found.  Recent Labs: Lab Results  Component Value Date   WBC 9.2 06/18/2024   HGB 14.8 06/18/2024   PLT 281 06/18/2024   NA 136 06/18/2024   K 4.5 06/18/2024   CL 99 06/18/2024   CO2 17 (L) 06/18/2024   GLUCOSE 95 06/18/2024   BUN 10 06/18/2024   CREATININE 0.92 06/18/2024   BILITOT 0.5 01/02/2024   ALKPHOS 32 (L) 01/02/2024   AST 16 01/02/2024   ALT 11 04/01/2024   PROT 7.1 08/02/2024   ALBUMIN 4.2 06/18/2024   CALCIUM  9.5 06/18/2024   GFRAA 86 12/18/2020   August 29, 2024 SPEP normal, vitamin D  43, phosphorus 4.3, TSH 2.98, PTH 26, ANA 1: 40 NS, ENA (SCL 70, RNP, Smith, SSA, SSB, dsDNA) negative, anticardiolipin negative, beta-2 GP 1 negative, lupus anticoagulant negative, sed rate 2  Speciality Comments: No specialty comments available.  Procedures:  No procedures performed Allergies: Sulfa antibiotics, Ciprofloxacin, Fentanyl , Gluten meal, and Singulair  [montelukast  sodium]   Assessment / Plan:     Visit Diagnoses: Age-related osteoporosis without current pathological fracture - 05/12/23 The BMD measured at Femur Neck Left is 0.849 g/cm2 with a T-score of -1.4.  With 3 stress fractures in the past.  Patient will be starting Tymlos.  She has been taking calcium  and vitamin D .  She has been exercising on a regular basis.  Labs obtained on August 29, 2024 SPEP normal vitamin D  43 phosphorus normal, TSH normal, PTH normal.  Labs were reviewed with the patient.  History of hip fracture - Right femoral head 2008  History of stress fracture - Right and left tibia during college.  Vitamin D  deficiency - January 2025 vitamin D  50.2.  August 29, 2024 vitamin D  was 43.  She was advised to continue calcium  and  vitamin D  as taken.  Family history of osteoporosis- mother, MGM  Raynaud's phenomenon without gangrene - All autoimmune workup negative except low titer ANA.  August 29, 2024 ENA panel negative, anticardiolipin negative, beta-2 GP 1 negative, lupus anticoagulant negative, sed rate normal.  Keeping core temperature warm and warm clothing was discussed.  She had no history of digital ulcers.  Other idiopathic scoliosis, thoracolumbar region-core strengthening exercises were emphasized to her.  She is already practicing Pilates.  Hypermobility of joint - She has hypermobility in multiple joints and laxity of the skin.  She was evaluated by geneticist and had YH11 gene.  Patient was evaluated by Dr. Joane.  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  She is on aspirin  81 mg, 2 tablets daily now.  Celiac disease - She is on gluten-free diet and vegetarian.  Other iron deficiency anemia  Other specified hypothyroidism  Elevated LDL cholesterol level - Patient will be starting Repatha.  Statin intolerance  Chronic migraine without aura, with intractable migraine, so stated, with status migrainosus  Gastroesophageal reflux disease without esophagitis  Hx of colonic polyp  Moderate persistent asthma without complication  Orders: Orders Placed This Encounter  Procedures   Uric acid   No orders of the defined types were placed in this encounter.   Follow-Up Instructions: Return in about 6 months (around 02/19/2025) for Osteoporosis.   Maya Nash, MD  Note - This record has been created using Animal nutritionist.  Chart creation errors have been sought, but may not always  have been located. Such creation errors do not reflect on  the standard of medical care.

## 2024-08-07 NOTE — Progress Notes (Signed)
 Specialty Pharmacy Initial Fill Coordination Note  Barbara Odonnell is a 48 y.o. female contacted today regarding initial fill of specialty medication(s) Abaloparatide [place])   Patient requested Delivery   Delivery date: 08/09/24   Verified address: [address]   [place] [address]   Medication will be filled on 08/08/2024.   Patient is aware of $0 copayment.   **DELIVERY NOTE** Pt lives on 3rd floor, [address]. The main door code [phone] (enter code on door handle)

## 2024-08-07 NOTE — Progress Notes (Signed)
 Pt and [place] aware of onboarding process. It is in process based on patient's schedule (She is going out of town)

## 2024-08-07 NOTE — Telephone Encounter (Signed)
 Patient returned call regarding Tymlos. She has given herself injections but not with pen needles. She is open to trying at home and if issues, will walk through demo in office at [facility] on 08/21/24  She is going out of town from Thursday to 08/14/2024. Requested shipment be made Thursday 08/15/24 or after. Ok to onboard her   Sherry Pennant, PharmD, MPH, BCPS, CPP Clinical Pharmacist [facility])

## 2024-08-07 NOTE — Telephone Encounter (Signed)
 Labs wnl to proceed with PTH analog treatment. ATC patient to discuss if interested in new start visit or if she feels comfortable starting at home. Left VM with callback number  Sherry Pennant, PharmD, MPH, BCPS, CPP Clinical Pharmacist [facility])

## 2024-08-08 ENCOUNTER — Other Ambulatory Visit: Payer: Self-pay

## 2024-08-08 DIAGNOSIS — I671 Cerebral aneurysm, nonruptured: Secondary | ICD-10-CM | POA: Diagnosis not present

## 2024-08-08 LAB — C3 AND C4
C3 Complement: 158 mg/dL (ref 83–193)
C4 Complement: 26 mg/dL (ref 15–57)

## 2024-08-08 LAB — CARDIOLIPIN ANTIBODIES, IGG, IGM, IGA
Anticardiolipin IgA: <2 [APL'U]/mL (ref <20.0)
Anticardiolipin IgG: <2 [GPL'U]/mL (ref <20.0)
Anticardiolipin IgM: 9.2 [MPL'U]/mL (ref <20.0)

## 2024-08-08 LAB — PROTEIN ELECTROPHORESIS, SERUM, WITH REFLEX
Albumin ELP: 4.3 g/dL (ref 3.8–4.8)
Alpha 1: 0.3 g/dL (ref 0.2–0.3)
Alpha 2: 0.7 g/dL (ref 0.5–0.9)
Beta 2: 0.3 g/dL (ref 0.2–0.5)
Beta Globulin: 0.5 g/dL (ref 0.4–0.6)
Gamma Globulin: 1 g/dL (ref 0.8–1.7)
Total Protein: 7.1 g/dL (ref 6.1–8.1)

## 2024-08-08 LAB — LUPUS ANTICOAGULANT EVAL W/ REFLEX
PTT-LA Screen: 27 s (ref <=40)
dRVVT: 38 s (ref <=45)

## 2024-08-08 LAB — ANTI-SCLERODERMA ANTIBODY: Scleroderma (Scl-70) (ENA) Antibody, IgG: <1 AI

## 2024-08-08 LAB — BETA-2 GLYCOPROTEIN ANTIBODIES
Beta-2 Glyco 1 IgA: <2 U/mL (ref <20.0)
Beta-2 Glyco 1 IgM: 8.3 U/mL (ref <20.0)
Beta-2 Glyco I IgG: <2 U/mL (ref <20.0)

## 2024-08-08 LAB — ANTI-NUCLEAR AB-TITER (ANA TITER): ANA Titer 1: 1:40 {titer} — ABNORMAL HIGH

## 2024-08-08 LAB — ANTI-DNA ANTIBODY, DOUBLE-STRANDED: ds DNA Ab: <1 [IU]/mL

## 2024-08-08 LAB — RNP ANTIBODY: Ribonucleic Protein(ENA) Antibody, IgG: <1 AI

## 2024-08-08 LAB — ANTI-SMITH ANTIBODY: ENA SM Ab Ser-aCnc: <1 AI

## 2024-08-08 LAB — SEDIMENTATION RATE: Sed Rate: 2 mm/h (ref 0–20)

## 2024-08-08 LAB — SJOGRENS SYNDROME-A EXTRACTABLE NUCLEAR ANTIBODY: SSA (Ro) (ENA) Antibody, IgG: <1 AI

## 2024-08-08 LAB — ANA: Anti Nuclear Antibody (ANA): POSITIVE — AB

## 2024-08-08 LAB — PARATHYROID HORMONE, INTACT (NO CA): PTH: 26 pg/mL (ref 16–77)

## 2024-08-08 LAB — TSH: TSH: 2.98 m[IU]/L

## 2024-08-08 LAB — PHOSPHORUS: Phosphorus: 4.3 mg/dL (ref 2.5–4.5)

## 2024-08-08 LAB — SJOGRENS SYNDROME-B EXTRACTABLE NUCLEAR ANTIBODY: SSB (La) (ENA) Antibody, IgG: <1 AI

## 2024-08-08 LAB — VITAMIN D 25 HYDROXY (VIT D DEFICIENCY, FRACTURES): Vit D, 25-Hydroxy: 43 ng/mL (ref 30–100)

## 2024-08-08 NOTE — Progress Notes (Signed)
 ANA is low titer positive.  All other labs are within normal limits.  I will discuss results at the follow-up visit.

## 2024-08-08 NOTE — Progress Notes (Signed)
 Called patient regarding Tymlos. She has given herself injections but not with pen needles. She is open to trying at home and if issues, will walk through demo in office at [facility] on 08/21/24.  Reviewed storage, injection sites, dosing, and treatment duration  She is going out of town from Thursday to 08/14/2024. Requested shipment be made Thursday 08/15/24 or after. Ok to onboard her   Sherry Pennant, PharmD, MPH, BCPS, CPP Clinical Pharmacist [facility])

## 2024-08-09 ENCOUNTER — Other Ambulatory Visit: Payer: Self-pay | Admitting: Medical

## 2024-08-19 ENCOUNTER — Encounter: Payer: Self-pay | Admitting: Family Medicine

## 2024-08-19 ENCOUNTER — Ambulatory Visit: Admitting: Family Medicine

## 2024-08-19 VITALS — BP 110/80 | HR 77 | Ht 66.5 in | Wt 160.0 lb

## 2024-08-19 DIAGNOSIS — Q796 Ehlers-Danlos syndrome, unspecified: Secondary | ICD-10-CM

## 2024-08-19 DIAGNOSIS — Z1589 Genetic susceptibility to other disease: Secondary | ICD-10-CM | POA: Diagnosis not present

## 2024-08-19 NOTE — Patient Instructions (Addendum)
 Thank you for coming in today.   Continue doing what you're doing. Well done  Check back in 3 months

## 2024-08-19 NOTE — Progress Notes (Signed)
   I, Leotis Batter, CMA acting as a scribe for Artist Lloyd, MD.  Barbara Odonnell is a 48 y.o. female who presents to [facility] today for evaluation of her hypermobility. L hip labral tear, chronic L knee pain, Raynaud's phenomenon, mesenteric artery dissection, asthma, hernias x3  MS: Abnormal spinal curvature and Slipping Ribs / Costochondritis  Skin/Immune reactions: Hyperextensible Skin, Fragile Skin, Soft Velvety Skin, Easy Bruising / Bleeding, Rashes / Hives, and Medication / Chemical / Food Sensitivities  Neurological: Headache  Migraine, Burning, Numbness and/or Tingling in Extremities, and Sensory Sensitivities ANS: Fatigue, Raynaud's , and Exercise / Temperature Intolerance Feels off kilter when standing suddenly, especially after exercise Respiratory: denies resp sx CV: Varicose Veins GI:  GERD and Hernia Genitourinary: Incontinence and Painful Intercourse Hands & Feet: Flat Feet  and Raynaud's Phenomenon    Pertinent review of systems: No fevers or chills  Relevant historical information: History of a dissection of the mesenteric artery.  History of recurrent abdominal hernia.  Patient is physically fit and participates in Pilates and resistance training and spin class.   Exam:  BP 110/80   Pulse 77   Ht 5' 6.5 (1.689 m)   Wt 160 lb (72.6 kg)   SpO2 98%   BMI 25.44 kg/m  General: Well Developed, well nourished, and in no acute distress.   MSK: Hypermobility evaluation is positive with a Beighton score of 5/9. Ehlers-Danlos evaluation is positive with 6 criteria.     Lab and Radiology Results No results found for this or any previous visit (from the past 72 hours). No results found.     Assessment and Plan: 48 y.o. female with hypermobility syndrome.  Patient effectively meets criteria for hypermobile Ehlers-Danlos syndrome with the following exceptions.  She fortunately is not having enough pain to meet the third criteria.   This is in my opinion due to her physical fitness and exercises that she does regularly.  She is already doing exactly what I typically would recommend my other patients with Ehlers-Danlos syndrome do.  Additionally she does have some alternative diagnoses.  Fortunately she has had vascular genetic testing and does have a mutation at the MYH 11 gene which can cause some other connective tissue disorders.  This would also exclude hypermobile Ehlers-Danlos.  From a pragmatic standpoint she does have Ehlers-Danlos or other connective tissue disorder.  She should be treated as though she has Ehlers-Danlos and have appropriate screening for aneurysms.  Plan to recheck in 3 months.  Return sooner if needed.   PDMP not reviewed this encounter. No orders of the defined types were placed in this encounter.  No orders of the defined types were placed in this encounter.    Discussed warning signs or symptoms. Please see discharge instructions. Patient expresses understanding.   The above documentation has been reviewed and is accurate and complete Artist Lloyd, M.D.

## 2024-08-21 ENCOUNTER — Ambulatory Visit: Payer: Self-pay | Attending: Rheumatology | Admitting: Rheumatology

## 2024-08-21 ENCOUNTER — Encounter: Payer: Self-pay | Admitting: Rheumatology

## 2024-08-21 ENCOUNTER — Other Ambulatory Visit

## 2024-08-21 VITALS — BP 105/70 | HR 76 | Temp 97.8°F | Resp 15 | Ht 66.5 in | Wt 160.0 lb

## 2024-08-21 DIAGNOSIS — Z789 Other specified health status: Secondary | ICD-10-CM

## 2024-08-21 DIAGNOSIS — Z860100 Personal history of colon polyps, unspecified: Secondary | ICD-10-CM

## 2024-08-21 DIAGNOSIS — M249 Joint derangement, unspecified: Secondary | ICD-10-CM

## 2024-08-21 DIAGNOSIS — M810 Age-related osteoporosis without current pathological fracture: Secondary | ICD-10-CM

## 2024-08-21 DIAGNOSIS — D508 Other iron deficiency anemias: Secondary | ICD-10-CM

## 2024-08-21 DIAGNOSIS — J4540 Moderate persistent asthma, uncomplicated: Secondary | ICD-10-CM

## 2024-08-21 DIAGNOSIS — M79671 Pain in right foot: Secondary | ICD-10-CM | POA: Diagnosis not present

## 2024-08-21 DIAGNOSIS — K55069 Acute infarction of intestine, part and extent unspecified: Secondary | ICD-10-CM

## 2024-08-21 DIAGNOSIS — E559 Vitamin D deficiency, unspecified: Secondary | ICD-10-CM

## 2024-08-21 DIAGNOSIS — Z87312 Personal history of (healed) stress fracture: Secondary | ICD-10-CM | POA: Diagnosis not present

## 2024-08-21 DIAGNOSIS — G43711 Chronic migraine without aura, intractable, with status migrainosus: Secondary | ICD-10-CM

## 2024-08-21 DIAGNOSIS — I7779 Dissection of other artery: Secondary | ICD-10-CM

## 2024-08-21 DIAGNOSIS — E7800 Pure hypercholesterolemia, unspecified: Secondary | ICD-10-CM

## 2024-08-21 DIAGNOSIS — Z8262 Family history of osteoporosis: Secondary | ICD-10-CM

## 2024-08-21 DIAGNOSIS — I7300 Raynaud's syndrome without gangrene: Secondary | ICD-10-CM

## 2024-08-21 DIAGNOSIS — K900 Celiac disease: Secondary | ICD-10-CM

## 2024-08-21 DIAGNOSIS — M79672 Pain in left foot: Secondary | ICD-10-CM

## 2024-08-21 DIAGNOSIS — G8929 Other chronic pain: Secondary | ICD-10-CM | POA: Diagnosis not present

## 2024-08-21 DIAGNOSIS — K551 Chronic vascular disorders of intestine: Secondary | ICD-10-CM

## 2024-08-21 DIAGNOSIS — Z8781 Personal history of (healed) traumatic fracture: Secondary | ICD-10-CM

## 2024-08-21 DIAGNOSIS — M4125 Other idiopathic scoliosis, thoracolumbar region: Secondary | ICD-10-CM

## 2024-08-21 DIAGNOSIS — K219 Gastro-esophageal reflux disease without esophagitis: Secondary | ICD-10-CM

## 2024-08-21 DIAGNOSIS — E038 Other specified hypothyroidism: Secondary | ICD-10-CM

## 2024-08-21 NOTE — Progress Notes (Signed)
 Pharmacy Note  Subjective:  Patient presents today to [facility] for follow up office visit.   Patient was seen by the pharmacist for counseling on Tymlos.   Objective: CMP     Component Value Date/Time   NA 136 06/18/2024 1414   K 4.5 06/18/2024 1414   CL 99 06/18/2024 1414   CO2 17 (L) 06/18/2024 1414   GLUCOSE 95 06/18/2024 1414   GLUCOSE 83 01/02/2024 2046   BUN 10 06/18/2024 1414   CREATININE 0.92 06/18/2024 1414   CREATININE 1.17 (H) 09/13/2019 0857   CALCIUM  9.5 06/18/2024 1414   PROT 7.1 08/02/2024 0921   PROT 6.8 11/06/2023 1231   ALBUMIN 4.2 06/18/2024 1414   AST 16 01/02/2024 2046   ALT 11 04/01/2024 0758   ALKPHOS 32 (L) 01/02/2024 2046   BILITOT 0.5 01/02/2024 2046   BILITOT 0.4 11/06/2023 1231   GFRNONAA >60 01/02/2024 2046   GFRNONAA 56 (L) 09/13/2019 0857   GFRAA 86 12/18/2020 1402   GFRAA 65 09/13/2019 0857    Vitamin D  Lab Results  Component Value Date   VD25OH 43 08/02/2024    DEXA scan, previous DEXA scan T-score was minus -1.4  Assessment/Plan:    Counseled patient on purpose, proper use, storage, and adverse effects of Tymlos. Discussed the Tymlos is PTH peptide agonist which results in stimulation of osteoblast and bone mass. Reviewed that Tymlos treatment course is for 2 years. Discussed that pen is stable for 30 days after first use and at room temperature. Counseled patient that Tymlos is a medication that must be injected once daily and that prescription for pen needles will be sent with Tymlos prescription.  Advised patient to continue taking calcium  1200 mg daily and vitamin D  supplement.  Reviewed the most common adverse effects of Tymlos including orthostatic hypotension, hypercalciuria, antibody development (which has no clinical significance), GI upset, injection site reaction, and joint pain/arthralgia.  Discussed injection site reaction management and injection site locations. Discussed alternating injection site.  Discussed  management of dizziness (which can occur for up to 4 hours after injection) including adequate hydration, slowly rising from bed/chairs to prevent fals, and taking Tymlos at bedtime.  We walked through Tymlos administration with demo pen and pen needle.  Discussed appropriate disposal of sharps.     She has received medication at home   All questions encouraged and answered.    Sherry Pennant, PharmD, MPH, BCPS, CPP Clinical Pharmacist [facility])

## 2024-08-22 ENCOUNTER — Other Ambulatory Visit: Payer: Self-pay | Admitting: Family Medicine

## 2024-08-22 ENCOUNTER — Ambulatory Visit: Payer: Self-pay | Admitting: Rheumatology

## 2024-08-22 LAB — URIC ACID: Uric Acid, Serum: 6 mg/dL (ref 2.5–7.0)

## 2024-08-22 NOTE — Progress Notes (Signed)
Uric acid is in normal range.

## 2024-08-23 NOTE — Progress Notes (Signed)
 Dermatology Consultation Note   Referring Physician:  [name], Md [address] [place],  [place] [address] Primary medical provider: Bulah Alm Heritage, [place] Contact Information: [phone] (home) [phone] (work)  No chief complaint on file.    Reason for visit:   Barbara Odonnell is a 48 y.o. 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 Appearance not bothersome at this time 3) Bump on left upper lip, frequently irritated, told previously this was a fordyce spot 4) Intermittent pink itchy bumps that are easy to bleed on lower extremities, treats with triamcinolone with some relief, some seem to be resolving and leaving behind dark or pink spots, all of this started after being on eliquis  (she is no longer on this)  Patient follows with Dr. Tandy for general dermatology.    Past Dermatologic, Medical & Surgical History reviewed.   Current Medications:  reviewed.   Allergies:  Allergies  Allergen Reactions  . Fentanyl  Dizziness and Other (See Comments)    BP drop  . Sulfa (Sulfonamide Antibiotics) Anaphylaxis  . Gluten Other (See Comments)    Celiac Disease  . Cat Dander Other (See Comments)  . Cigarette Smoke Other (See Comments)  . Ciprofloxacin Other (See Comments)    Per genetics, h/o disection  . Dog Dander Other (See Comments)  . Mold Other (See Comments)  . Singulair  [Montelukast ] Other (See Comments)     Social History: reviewed.   Review of Systems: full review of systems performed.  See history of present illness above as well as any pertinent positives below if applicable.   Physical Examination:  There were no vitals filed for this visit. General:  Barbara Odonnell is well-appearing female, in no acute distress. Alert and oriented x3.  Psych:  Mood and affect are pleasant and appropriate.   Skin Exam:    Areas examined: []  Scalp/Hair [x]  Face (including eyelids/conjunctivae, nose, lips, ears) []  Neck []  Chest []  Abdomen []  Back []  Upper extremities [x]  Lower extremities []  Digits/nails  []  Buttocks  Fitzpatrick skin type II  Pertinent examination findings:  -bilateral cheeks and perinasal area with few superficial telengectasia -left upper medial cheek with bright red 1mm papule -nose with increased density of sebaceous tissue -left upper lip vermillion border with <33mm skin colored papule more easily visible with dermoscopy -lower extremities with very few scattered pink papules in various stages of healing, some excoriated, some with background PIH  All areas not specifically commented on are within normal limits. Examination of areas not checked above were deferred by the patient.    Pertinent Labs were reviewed.   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  -  Recommended pulsed dye laser. Laser procedure was discussed in detail including downtime as well as risks of bruising, redness and scarring after treatment. Discussed that multiple treatments would need to be performed for a series of treatments. Risk of recurrence was discussed. Need for maintenance treatment was discussed. Discussed that multiple treatments may be required.   3. Fordyce spots Of left upper lip, subtle. Benign diagnosis discussed, normal skin tissue. Reassurance provided. No treatment necessary. If grows or becomes bothersome in future can consider electrodesiccation.   4. Sebaceous hyperplasia Of nose, mild today. Diagnosis and natural history discussed. Recommend increasing frequency of  topical retinoids to nose (and entire face) as tolerate to nightly. If texture or sebaceous hyperplasia of nose worsens can consider ablative laser vs electrodesiccation.   5. Inflammatory  papule Intermittent pruritic papule on lower extremities, responsive to triamcinolone, today with PIH and evidence of some excoriation. Not c/w cherry angioma or other vascular issue. If pink/red color remains, can consider treating with PDL to just help with erythema, otherwise recommend patient continues to monitor and treat as she has been doing.    Note: For all cosmetic quotes given, it was discussed that cosmetic pricing can change over time.   The contact information for this clinic was provided should there be any questions or concerns in the interim.  Assessment and plan were discussed with the patient who then verbalized understanding.  Caprice Hope, MD [facility] Department of Dermatology  PGY-3    Attestation Statement:   I personally performed the service. (TP)  SABRINA SISTO JERRY ELIZABETH, MD

## 2024-08-23 NOTE — Telephone Encounter (Signed)
 I do not see she is still on this.

## 2024-08-26 DIAGNOSIS — D7589 Other specified diseases of blood and blood-forming organs: Secondary | ICD-10-CM | POA: Diagnosis not present

## 2024-08-29 ENCOUNTER — Other Ambulatory Visit: Payer: Self-pay

## 2024-08-29 DIAGNOSIS — L738 Other specified follicular disorders: Secondary | ICD-10-CM | POA: Diagnosis not present

## 2024-08-29 DIAGNOSIS — D1801 Hemangioma of skin and subcutaneous tissue: Secondary | ICD-10-CM | POA: Diagnosis not present

## 2024-08-29 DIAGNOSIS — Q386 Other congenital malformations of mouth: Secondary | ICD-10-CM | POA: Diagnosis not present

## 2024-08-29 DIAGNOSIS — I781 Nevus, non-neoplastic: Secondary | ICD-10-CM | POA: Diagnosis not present

## 2024-08-30 ENCOUNTER — Other Ambulatory Visit: Payer: Self-pay

## 2024-09-02 ENCOUNTER — Other Ambulatory Visit: Payer: Self-pay

## 2024-09-02 ENCOUNTER — Other Ambulatory Visit: Payer: Self-pay | Admitting: Pharmacy Technician

## 2024-09-02 ENCOUNTER — Encounter (INDEPENDENT_AMBULATORY_CARE_PROVIDER_SITE_OTHER): Payer: Self-pay

## 2024-09-02 NOTE — Progress Notes (Signed)
 Specialty Pharmacy Refill Coordination Note  Barbara Odonnell is a 48 y.o. female contacted today regarding refills of specialty medication(s) Abaloparatide Hattie)   Patient requested (Patient-Rptd) Delivery   Delivery date: 09/05/2024 Verified address: (Patient-Rptd) [address], [place] [address] (Door Code [phone]) (Deliver by 11/7 or on or after 11/13) (Out of town 11/8-11/12)   Medication will be filled on: 09/04/2024

## 2024-09-03 ENCOUNTER — Ambulatory Visit: Admitting: Sports Medicine

## 2024-09-03 ENCOUNTER — Ambulatory Visit: Admitting: Diagnostic Neuroimaging

## 2024-09-03 ENCOUNTER — Encounter: Payer: Self-pay | Admitting: Diagnostic Neuroimaging

## 2024-09-03 ENCOUNTER — Other Ambulatory Visit: Payer: Self-pay

## 2024-09-03 VITALS — BP 91/66 | HR 73 | Ht 67.0 in | Wt 162.4 lb

## 2024-09-03 DIAGNOSIS — G43009 Migraine without aura, not intractable, without status migrainosus: Secondary | ICD-10-CM

## 2024-09-03 DIAGNOSIS — I671 Cerebral aneurysm, nonruptured: Secondary | ICD-10-CM

## 2024-09-03 MED ORDER — NURTEC 75 MG PO TBDP
75.0000 mg | ORAL_TABLET | Freq: Every day | ORAL | 6 refills | Status: AC | PRN
Start: 2024-09-03 — End: ?

## 2024-09-03 MED ORDER — AJOVY 225 MG/1.5ML ~~LOC~~ SOAJ: 225.0000 mg | SUBCUTANEOUS | 11 refills | Status: AC

## 2024-09-03 MED ORDER — NARATRIPTAN HCL 2.5 MG PO TABS: 2.5000 mg | ORAL_TABLET | ORAL | 6 refills | Status: AC | PRN

## 2024-09-03 NOTE — Patient Instructions (Addendum)
  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

## 2024-09-03 NOTE — Progress Notes (Signed)
 [facility]  PATIENT: Barbara Odonnell DOB: 11-17-1975  REFERRING CLINICIAN: Bulah Alm RAMAN, PA-C HISTORY FROM: patient  REASON FOR VISIT: new consult   HISTORICAL  CHIEF COMPLAINT:  Chief Complaint  Patient presents with   Follow-up    Pt in 7 alone Pt here for Migraine f/u Pt states wants to review recent MRI Pt states admitted to hospital 11/2023     HISTORY OF PRESENT ILLNESS:   48 year old female here for follow-up of migraine headaches and evaluation of CTA, MRA head findings.    History of migraine headaches since age 59 or 67 results consisting of occipital and global pressure and throbbing sensation associated with nausea.  No sensitive light or sound.  Triggering factors include high intensity exercise or exertion.  Was having more than 20 days of headache per month for many years.  Had a variety of medically medications including topiramate, Aimovig , rizatriptan , Ubrelvy .  More recently has been on Ajovy  and naratriptan  with good results.  Presently averaging 2 to 3 days of severe migraine per month and 15 total days of milder headaches per month.  12/01/2023 patient presented to emergency room due to severe left flank and low back pain.  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  She was admitted for blood pressure control.  Patient was discharged on aspirin  and Eliquis .  Patient followed up at [facility] vascular surgery clinic and then pediatric genetic clinic at [facility] with Dr. Pamila.  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  Recommendations for echocardiogram for monitoring of the aorta every 2 years, ongoing blood pressure monitoring and management and CT and MR angiogram of the head and neck were recommended.  MRA of the head showed a small  outpouching of the right supraclinoid internal carotid artery raising possibility of small aneurysm versus infundibulum.  This was followed up with CT angiogram of the head which showed a 1.3 mm protrusion in this region consistent with a small infundibulum or tiny aneurysm, of doubtful significance.    REVIEW OF SYSTEMS: Full 14 system review of systems performed and negative with exception of: as per HPI.  ALLERGIES: Allergies  Allergen Reactions   Sulfa Antibiotics Anaphylaxis   Ciprofloxacin Other (See Comments)   Fentanyl  Other (See Comments)    BP drop   Gluten Meal     Celiac Disease   Singulair  [Montelukast  Sodium]     Angry, irritable, out of skin feeling    HOME MEDICATIONS: Outpatient Medications Prior to Visit  Medication Sig Dispense Refill   Abaloparatide (TYMLOS) 3120 MCG/1.56ML SOPN Inject 80 mcg into the skin at bedtime. TAKE AT BEDTIME. ROTATE INJECTION SITES 1.56 mL 5   Albuterol  Sulfate (PROAIR  HFA IN) Inhale 1 puff into the lungs daily as needed.     amLODipine  (NORVASC ) 10 MG tablet TAKE 1 TABLET BY MOUTH EVERY DAY (Patient taking differently: Take 5 mg by mouth daily.) 90 tablet 1   amLODipine  (NORVASC ) 5 MG tablet Take 5 mg by mouth daily.     Ascorbic Acid (C-500) 500 MG CHEW Chew 1,000 mg by mouth.     ASPIRIN  LOW DOSE 81 MG tablet Take 81 mg by mouth daily. (Patient taking differently: Take 162 mg by mouth daily.)     budesonide -formoterol  (SYMBICORT ) 160-4.5 MCG/ACT inhaler Inhale 2 puffs into the lungs 2 (  two) times daily. in the morning and at bedtime. 10.2 each 11   buPROPion  (WELLBUTRIN  XL) 150 MG 24 hr tablet TAKE 1 TABLET BY MOUTH EVERY DAY 90 tablet 1   carvedilol  (COREG ) 12.5 MG tablet TAKE 1 TABLET (12.5 MG TOTAL) BY MOUTH IN THE MORNING, AT NOON, AND AT BEDTIME. (Patient taking differently: Take 12.5 mg by mouth 4 (four) times daily.) 270 tablet 1   chlorhexidine  (PERIDEX ) 0.12 % solution SMARTSIG:0.5 Ounce(s) Twice Daily     clindamycin (CLEOCIN  T) 1 % lotion Apply topically.     clindamycin (CLINDAGEL) 1 % gel Apply topically.     clobetasol ointment (TEMOVATE) 0.05 % Apply topically.     clotrimazole -betamethasone (LOTRISONE) cream Apply topically 2 (two) times daily as needed.     cyclobenzaprine  (FLEXERIL ) 5 MG tablet Take 1 tablet (5 mg total) by mouth as needed. 30 tablet 0   diclofenac  (FLECTOR ) 1.3 % PTCH Place 1 patch onto the skin 2 (two) times daily. (Patient taking differently: Place 1 patch onto the skin as needed.) 60 patch 1   EPINEPHrine  0.3 mg/0.3 mL IJ SOAJ injection 1 Syringe.     etonogestrel -ethinyl estradiol  (NUVARING) 0.12-0.015 MG/24HR vaginal ring Place 1 Ring vaginally.     fluticasone  (FLONASE ) 50 MCG/ACT nasal spray 2 sprays.     hyoscyamine  (LEVSIN  SL) 0.125 MG SL tablet Take 2 tablets (0.25 mg total) by mouth every 4 (four) hours as needed (bladder spasm). 30 tablet 0   Insulin Pen Needle 31G X 5 MM MISC Use to inject Tymlos. DO NOT REUSE NEEDLES. ROTATE INJECTION SITES 100 each 2   lisinopril (ZESTRIL) 5 MG tablet Take 5 mg by mouth daily.     omeprazole  (PRILOSEC) 40 MG capsule TAKE 1 CAPSULE (40 MG TOTAL) BY MOUTH DAILY. (Patient taking differently: Take 40 mg by mouth as needed.) 30 capsule 1   ondansetron  (ZOFRAN -ODT) 4 MG disintegrating tablet DISSOLVE 1 TABLET ON THE TONGUE EVERY 8 HOURS AS NEEDED FOR NAUSEA AND VOMITING 90 tablet 0   REPATHA SURECLICK 140 MG/ML SOAJ Inject into the skin every 14 (fourteen) days.     SODIUM FLUORIDE 5000 PPM 1.1 % PSTE 2 (two) times daily.     SYNTHROID  50 MCG tablet TAKE 1 TABLET BY MOUTH EVERY DAY 90 tablet 1   tretinoin (RETIN-A) 0.05 % cream Apply topically.     triamcinolone cream (KENALOG) 0.1 % Apply 1 Application topically.     valACYclovir  (VALTREX ) 500 MG tablet TAKE 1 TABLET BY MOUTH EVERY DAY **MYLAN BRAND** (Patient taking differently: Take 500 mg by mouth daily as needed.  **MYLAN BRAND**) 90 tablet 0   VENTOLIN  HFA 108 (90 Base) MCG/ACT inhaler TAKE 2  PUFFS BY MOUTH EVERY 6 HOURS AS NEEDED FOR WHEEZE OR SHORTNESS OF BREATH 18 each 0   VITAMIN D  PO Take 2,000 Units by mouth.     Fremanezumab -vfrm (AJOVY ) 225 MG/1.5ML SOAJ Inject 225 mg into the skin every 30 (thirty) days. Please run copay card: BIN [id number] PCN [id number] GRP [id number] ID [id number] 1.5 mL 11   naratriptan  (AMERGE) 2.5 MG tablet      No facility-administered medications prior to visit.    PAST MEDICAL HISTORY: Past Medical History:  Diagnosis Date   Allergy     Anemia    IV iron in past, prior to 2018   Asthma    Celiac disease    +prior biopsy   GERD (gastroesophageal reflux disease)    H/O bone density study 12/21/2017  normal   Hip fracture [facility]) 2008   right, Dr. Harvey   History of stress test 2014   [facility]   Hyperlipidemia    Hypertension    Hypothyroidism    Migraine headache without aura    gets bad nausea.  does well on Naratriptan  and Relpax.  Failed Imitrex , Maxalt    Osteopenia    Stress fracture    severeral prior   Transfusion history    has had IV iron prior, Dr. Timmy prior management   Ulcer    previous ulcer with benign polyp removed    PAST SURGICAL HISTORY: Past Surgical History:  Procedure Laterality Date   COLONOSCOPY  2015   [facility], advised to repeat q5 years; x 4 prior   COLONOSCOPY N/A 04/18/2024   Procedure: COLONOSCOPY;  Surgeon: Avram Lupita BRAVO, MD;  Location: [facility];  Service: Gastroenterology;  Laterality: N/A;  Intolerant of Fentanyl  and Propofol    COSMETIC SURGERY     cheek implants and nasal surgery   ESOPHAGOGASTRODUODENOSCOPY     x5 prior as of 2016   ESOPHAGOGASTRODUODENOSCOPY N/A 04/18/2024   Procedure: EGD (ESOPHAGOGASTRODUODENOSCOPY);  Surgeon: Avram Lupita BRAVO, MD;  Location: [facility];  Service: Gastroenterology;  Laterality: N/A;  Intolerant of Fentanyl  and Propofol    INGUINAL HERNIA REPAIR     left   NASAL SEPTUM SURGERY     POLYPECTOMY     sigmoid colon   SINOSCOPY     WISDOM TOOTH  EXTRACTION      FAMILY HISTORY: Family History  Problem Relation Age of Onset   Hypertension Mother    Osteopenia Mother    Raynaud syndrome Mother    Celiac disease Mother    Cancer Father        asbestos related lung cancer   Heart disease Father 57       Stent   Pulmonary embolism Father    Stroke Father    Celiac disease Brother    Gout Brother    Gout Brother    Celiac disease Brother    Stroke Maternal Grandmother    COPD Maternal Grandmother    Osteopenia Maternal Grandmother    Heart disease Maternal Grandfather    Stroke Maternal Grandfather    Heart disease Paternal Grandfather        died 52yo   Diabetes Neg Hx     SOCIAL HISTORY: Social History   Socioeconomic History   Marital status: Single    Spouse name: Not on file   Number of children: Not on file   Years of education: Not on file   Highest education level: Doctorate  Occupational History   Not on file  Tobacco Use   Smoking status: Never    Passive exposure: Past   Smokeless tobacco: Never  Vaping Use   Vaping status: Never Used  Substance and Sexual Activity   Alcohol use: Yes    Alcohol/week: 1.0 standard drink of alcohol    Types: 1 Glasses of wine per week    Comment: rare   Drug use: No   Sexual activity: Not on file  Other Topics Concern   Not on file  Social History Narrative   Lives alone, 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   Prior worked as professor at [employer].  Exercise:- 6-7 days per week, teaches spin at [employer], swam competitively in college, marathon runner.       [facility]:  Originally from [place]. Has also lived in [place], [place], & [place].  Currently working in counseling. No pets currently. No bird exposure. No mold or hot tub exposure. Currently has carpet in the bedroom. No draperies. No exposure to asbestos.      Social Drivers of Corporate Investment Banker Strain: Low  Risk  (07/31/2024)   Received from [facility]   Overall Financial Resource Strain (CARDIA)    How hard is it for you to pay for the very basics like food, housing, medical care, and heating?: Not very hard  Food Insecurity: Food Insecurity Present (07/31/2024)   Received from [facility]   Hunger Vital Sign    Within the past 12 months, you worried that your food would run out before you got the money to buy more.: Sometimes true    Within the past 12 months, the food you bought just didn't last and you didn't have money to get more.: Never true  Transportation Needs: No Transportation Needs (07/31/2024)   Received from [facility] - Transportation    In the past 12 months, has lack of transportation kept you from medical appointments or from getting medications?: No    In the past 12 months, has lack of transportation kept you from meetings, work, or from getting things needed for daily living?: No  Physical Activity: Sufficiently Active (07/31/2024)   Received from [facility]   Exercise Vital Sign    On average, how many days per week do you engage in moderate to strenuous exercise (like a brisk walk)?: 5 days    On average, how many minutes do you engage in exercise at this level?: 50 min  Stress: No Stress Concern Present (07/31/2024)   Received from [facility] - Occupational Stress Questionnaire    Do you feel stress - tense, restless, nervous, or anxious, or unable to sleep at night because your mind is troubled all the time - these days?: Only a little  Social Connections: Moderately Integrated (07/31/2024)   Received from [facility]   Social Network    How would you rate your social network (family, work, friends)?: Adequate participation with social networks  Intimate Partner Violence: Not At Risk (07/31/2024)   Received from [facility]   HITS    Over the last 12 months how often did your partner physically hurt  you?: Never    Over the last 12 months how often did your partner insult you or talk down to you?: Never    Over the last 12 months how often did your partner threaten you with physical harm?: Never    Over the last 12 months how often did your partner scream or curse at you?: Never     PHYSICAL EXAM  GENERAL EXAM/CONSTITUTIONAL: Vitals:  Vitals:   09/03/24 1424  BP: 91/66  Pulse: 73  Weight: 162 lb 6.4 oz (73.7 kg)  Height: 5' 7 (1.702 m)   Body mass index is 25.44 kg/m. Wt Readings from Last 3 Encounters:  09/03/24 162 lb 6.4 oz (73.7 kg)  08/21/24 160 lb (72.6 kg)  08/19/24 160 lb (72.6 kg)   Patient is in no distress; well developed, nourished and groomed; neck is supple  CARDIOVASCULAR: Examination of carotid arteries is normal; no carotid bruits Regular rate and rhythm, no murmurs Examination of peripheral vascular system by observation and palpation is normal  EYES: Ophthalmoscopic exam of optic discs and posterior segments is  normal; no papilledema or hemorrhages No results found.  MUSCULOSKELETAL: Gait, strength, tone, movements noted in Neurologic exam below  NEUROLOGIC: MENTAL STATUS:      No data to display         awake, alert, oriented to person, place and time recent and remote memory intact normal attention and concentration language fluent, comprehension intact, naming intact fund of knowledge appropriate  CRANIAL NERVE:  2nd - no papilledema on fundoscopic exam 2nd, 3rd, 4th, 6th - pupils equal and reactive to light, visual fields full to confrontation, extraocular muscles intact, no nystagmus 5th - facial sensation symmetric 7th - facial strength symmetric 8th - hearing intact 9th - palate elevates symmetrically, uvula midline 11th - shoulder shrug symmetric 12th - tongue protrusion midline  MOTOR:  normal bulk and tone, full strength in the BUE, BLE  SENSORY:  normal and symmetric to light touch, temperature,  vibration  COORDINATION:  finger-nose-finger, fine finger movements normal  REFLEXES:  deep tendon reflexes 1+ and symmetric  GAIT/STATION:  narrow based gait     DIAGNOSTIC DATA (LABS, IMAGING, TESTING) - I reviewed patient records, labs, notes, testing and imaging myself where available.  Lab Results  Component Value Date   WBC 9.2 06/18/2024   HGB 14.8 06/18/2024   HCT 45.5 06/18/2024   MCV 99 (H) 06/18/2024   PLT 281 06/18/2024      Component Value Date/Time   NA 136 06/18/2024 1414   K 4.5 06/18/2024 1414   CL 99 06/18/2024 1414   CO2 17 (L) 06/18/2024 1414   GLUCOSE 95 06/18/2024 1414   GLUCOSE 83 01/02/2024 2046   BUN 10 06/18/2024 1414   CREATININE 0.92 06/18/2024 1414   CREATININE 1.17 (H) 09/13/2019 0857   CALCIUM  9.5 06/18/2024 1414   PROT 7.1 08/02/2024 0921   PROT 6.8 11/06/2023 1231   ALBUMIN 4.2 06/18/2024 1414   AST 16 01/02/2024 2046   ALT 11 04/01/2024 0758   ALKPHOS 32 (L) 01/02/2024 2046   BILITOT 0.5 01/02/2024 2046   BILITOT 0.4 11/06/2023 1231   GFRNONAA >60 01/02/2024 2046   GFRNONAA 56 (L) 09/13/2019 0857   GFRAA 86 12/18/2020 1402   GFRAA 65 09/13/2019 0857   Lab Results  Component Value Date   CHOL 231 (H) 12/18/2023   HDL 62 12/18/2023   LDLCALC 152 (H) 12/18/2023   LDLDIRECT 136 (H) 09/15/2020   TRIG 95 12/18/2023   CHOLHDL 3.7 12/18/2023   No results found for: HGBA1C Lab Results  Component Value Date   VITAMINB12 472 09/24/2019   Lab Results  Component Value Date   TSH 2.98 08/02/2024    01/02/24 CT angio chest / abd / pelvis 1. No acute aortic syndrome. 2. No acute abnormality in the chest, abdomen, or pelvis. 3. 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  07/26/24 MRA head / neck [I reviewed images myself and agree with interpretation. -VRP]   1. There is no stenosis of intracranial circulation.  2. There appears to be a small focal outpouching posterior wall of the supraclinoid right internal carotid artery, within the expected location of the posterior communicating artery. Aneurysmal dilatation is not excluded. CT angiogram is recommended for further evaluation..   08/08/24 CTA head [I reviewed images myself and agree with interpretation. -VRP]  - Tiny 1.3 mm protrusion from the inferior aspect  of the supraclinoid portion of the right internal carotid artery consistent with a small infundibulum or tiny aneurysm. It is of doubtful significance.      ASSESSMENT AND PLAN  48 y.o. year old female here with:  Meds tried (Dr. Ines note 08/14/23): From a thorough review of records, medications tried that can be used in migraine management include Aimovig , propranolol contraindicated due to asthma, Wellbutrin , Fioricet, Flexeril , Robaxin , Solu-Medrol  injections, naratriptan (fav for mild to moderate), Zofran , prednisone  tablets, sumatriptan , Maxalt , Nurtec, tizanidine , Ubrelvy ,Topiramate(side effects), cymbalta, effexor, can't take BP meds due to hypotension, amitriptyline/nortriptyline, zoloft, prozac, Aimovig  - not helpful   Dx:  1. Internal carotid aneurysm   2. 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 Janjua for consultation (for discussion of long term monitoring / management in setting of recent SMA dissection and possible vascular syndrome not otherwise specified)   Orders Placed This Encounter  Procedures   Ambulatory referral to Neurosurgery    Meds ordered this encounter  Medications   naratriptan  (AMERGE) 2.5 MG tablet    Sig: Take 1 tablet (2.5 mg total) by mouth as needed for migraine. Take one (1) tablet at onset of headache; if returns or does not resolve, may repeat after 4 hours; do not exceed five (5) mg in 24 hours.    Dispense:  10 tablet    Refill:  6   Fremanezumab -vfrm (AJOVY ) 225 MG/1.5ML SOAJ    Sig: Inject 225 mg into the skin every 30 (thirty) days. Please run copay card: BIN [id number] PCN [id number] GRP [id number] ID [id number]    Dispense:  1.5 mL    Refill:  11    Please run copay card: BIN [id number] PCN PDMI GRP [id number] ID [id number]   Rimegepant Sulfate (NURTEC) 75 MG TBDP    Sig: Take 1 tablet (75 mg total) by mouth daily as needed.    Dispense:  8 tablet    Refill:  6   Return in about 6 months (around 03/03/2025) for MyChart visit (15 min).  I spent 65 minutes of face-to-face and non-face-to-face time with patient.  This included previsit chart review, lab review, study review, order entry, electronic health record documentation, patient education.     EDUARD FABIENE HANLON, MD 09/03/2024, 3:34 PM Certified in Neurology, Neurophysiology and Neuroimaging  [facility] [address], [address] [place], [place] [address] [phone]

## 2024-09-10 DIAGNOSIS — R519 Headache, unspecified: Secondary | ICD-10-CM | POA: Diagnosis not present

## 2024-09-10 DIAGNOSIS — J00 Acute nasopharyngitis [common cold]: Secondary | ICD-10-CM | POA: Diagnosis not present

## 2024-09-10 DIAGNOSIS — R5383 Other fatigue: Secondary | ICD-10-CM | POA: Diagnosis not present

## 2024-09-10 DIAGNOSIS — J029 Acute pharyngitis, unspecified: Secondary | ICD-10-CM | POA: Diagnosis not present

## 2024-09-10 DIAGNOSIS — M791 Myalgia, unspecified site: Secondary | ICD-10-CM | POA: Diagnosis not present

## 2024-09-10 DIAGNOSIS — R6889 Other general symptoms and signs: Secondary | ICD-10-CM | POA: Diagnosis not present

## 2024-09-11 DIAGNOSIS — Z01419 Encounter for gynecological examination (general) (routine) without abnormal findings: Secondary | ICD-10-CM | POA: Diagnosis not present

## 2024-09-11 DIAGNOSIS — N951 Menopausal and female climacteric states: Secondary | ICD-10-CM | POA: Diagnosis not present

## 2024-09-11 DIAGNOSIS — M8580 Other specified disorders of bone density and structure, unspecified site: Secondary | ICD-10-CM | POA: Diagnosis not present

## 2024-09-11 DIAGNOSIS — G43909 Migraine, unspecified, not intractable, without status migrainosus: Secondary | ICD-10-CM | POA: Diagnosis not present

## 2024-09-11 DIAGNOSIS — N763 Subacute and chronic vulvitis: Secondary | ICD-10-CM | POA: Diagnosis not present

## 2024-09-13 ENCOUNTER — Other Ambulatory Visit: Payer: Self-pay | Admitting: Medical

## 2024-09-17 DIAGNOSIS — Z79899 Other long term (current) drug therapy: Secondary | ICD-10-CM | POA: Diagnosis not present

## 2024-09-17 DIAGNOSIS — Z7901 Long term (current) use of anticoagulants: Secondary | ICD-10-CM | POA: Diagnosis not present

## 2024-09-17 DIAGNOSIS — I10 Essential (primary) hypertension: Secondary | ICD-10-CM | POA: Diagnosis not present

## 2024-09-17 DIAGNOSIS — D7589 Other specified diseases of blood and blood-forming organs: Secondary | ICD-10-CM | POA: Diagnosis not present

## 2024-09-17 DIAGNOSIS — E785 Hyperlipidemia, unspecified: Secondary | ICD-10-CM | POA: Diagnosis not present

## 2024-09-17 DIAGNOSIS — I7779 Dissection of other artery: Secondary | ICD-10-CM | POA: Diagnosis not present

## 2024-09-17 DIAGNOSIS — Z79818 Long term (current) use of other agents affecting estrogen receptors and estrogen levels: Secondary | ICD-10-CM | POA: Diagnosis not present

## 2024-09-17 DIAGNOSIS — E039 Hypothyroidism, unspecified: Secondary | ICD-10-CM | POA: Diagnosis not present

## 2024-09-17 DIAGNOSIS — I7770 Dissection of unspecified artery: Secondary | ICD-10-CM | POA: Diagnosis not present

## 2024-09-17 DIAGNOSIS — Z1371 Encounter for nonprocreative screening for genetic disease carrier status: Secondary | ICD-10-CM | POA: Diagnosis not present

## 2024-09-17 DIAGNOSIS — Z7982 Long term (current) use of aspirin: Secondary | ICD-10-CM | POA: Diagnosis not present

## 2024-09-19 ENCOUNTER — Ambulatory Visit: Admitting: Sports Medicine

## 2024-09-22 ENCOUNTER — Encounter: Payer: Self-pay | Admitting: Vascular Surgery

## 2024-09-25 ENCOUNTER — Other Ambulatory Visit (HOSPITAL_COMMUNITY): Payer: Self-pay

## 2024-09-27 DIAGNOSIS — I10 Essential (primary) hypertension: Secondary | ICD-10-CM | POA: Diagnosis not present

## 2024-09-27 DIAGNOSIS — E7800 Pure hypercholesterolemia, unspecified: Secondary | ICD-10-CM | POA: Diagnosis not present

## 2024-09-27 DIAGNOSIS — K55069 Acute infarction of intestine, part and extent unspecified: Secondary | ICD-10-CM | POA: Diagnosis not present

## 2024-09-27 DIAGNOSIS — I6523 Occlusion and stenosis of bilateral carotid arteries: Secondary | ICD-10-CM | POA: Diagnosis not present

## 2024-09-30 ENCOUNTER — Other Ambulatory Visit: Payer: Self-pay

## 2024-09-30 NOTE — Progress Notes (Signed)
 OTO FACIAL PLASTIC/RECONSTRUCTIVE SURGERY: New Patient Visit   Facial Plastic and Reconstructive Surgery  Department of Otolaryngology-Head and Neck Surgery [address] [place], [place] [address]   Barbara Odonnell [phone]  CC: Left nasal congestion     HISTORY OF PRESENT ILLNESS: Barbara Odonnell  is a 48 y.o. female presenting to restablish care. 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   Previously in 2015:  Patient noticed a bump on the right side of her nose that became larger. She had a punch biopsy. Path was benign. She sts she has a history of nose surgery which she had to remove a dorsal hump in [place] (approximately 1993).  She also reported a history of left nasal obstruction. She has a history of allergies, previously had allergy  shots, and stopped these injections because she had a bad reaction to allergy  testing with a new allergist in [place], [place]. Pt sts she has improvement of breathing with wearing breath right strips.  She had lysis of synechiae of the left nostril with stenting on 10/28/2013. She noted an improvement in post op breathing. She was last seen 05/06/2014.   (11/01/22): Barbara Odonnell presents today to re-establish care. 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 She reports good improvement following her procedure back in 2015 but has had recurrence of left nasal congestion in the past year. She denies any changes in her living situation or health besides being in peri menopause. She uses nasal saline spray multiple times per day. She has noted some blood tinged nasal mucous but no frank epistaxis.   TODAY(10/04/24): [name] presents today for a follow up visit. Patient notes her cheek implants that are  bothering her jaw and causing pain and trismus.  History of Present Illness She reports that on Thursday, the 20th, she experienced jaw and cheek pain upon awakening from a 1-hour nap. This was accompanied by difficulty in opening her jaw and chewing. An endodontist prescribed a prednisone  pack and identified an abnormal protrusion in her jawbone. A subsequent visit to an oral surgeon on Monday resulted in a CT cone beam scan, which revealed a discrepancy in the placement of her dental implant. She has no history of facial trauma or unusual sleep patterns. The patient has not noticed the ledge before. She recently had a dental appointment and a consultation with a periodontist, during which no abnormalities were reported. She also reports intermittent fullness in both ears. She has been using decibel ear blockers for concerts and was informed of bony protrusions in her ears, likely due to swimming in cold water. She has been provided with earplugs by an audiologist. The patient has always noticed a little bit more puffiness on one side of her face. She has a CTA of her brain from 07/2024, which might have found a small aneurysm. She reports no issues with her tear system or any unusual symptoms in her eye on the affected side. The patient reports that the steroid pack has made her symptoms slightly better.  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 She is currently under conservative management and is on three antihypertensive medications, despite never having had high blood pressure. The dissection occurred at 6 AM on 11/28/2023, characterized by a sensation of a 20-pound weight in her  abdomen, lower back pain, and elevated blood pressure of 140. She sought emergency care after a urologist misdiagnosed her symptoms as a muscle pull.  She also developed two hernias following the dissection: an epigastric hernia located equidistant between the sternum and the top of the umbilicus, and a hernia to the right of her inguinal hernia. The patient has not had any actual repair for the umbilical or the epigastric hernia. She had an inguinal hernia repair in 2000.  PAST SURGICAL HISTORY: - Inguinal hernia repair, 2000   Past Medical History:  Diagnosis Date  . Anemia   . Asthma (HHS/HCC)   . Asthmatic pulmonary alveolitis (HHS/HCC)   . Breast nodule   . Celiac disease (HHS/HCC)    biopsy proven  . Colon polyp   . GERD (gastroesophageal reflux disease)   . Headache(784.0)   . Hernia   . History of colonic polyps   . Hyperparathyroidism (CMS/HCC,HHS/HCC) (HHS/HCC)   . Hypothyroidism   . Lung disease   . Menopausal symptoms   . Migraine   . Oligomenorrhea, unspecified   . Osteopenia   . Rash   . Thyroid  disease   . Vitamin D  deficiency     Past Surgical History:  Procedure Laterality Date  . capsule endoscopy  05/08/2024  . COLONOSCOPY    . COLONOSCOPY W/ ENDOSCOPIC FNA/FNB  04/18/2024  . HERNIA REPAIR-INGUINAL Left   . PR EXCISION/DESTRUCTION INTRANASAL LESION INT APPR Left 10/28/2013   Procedure: RESECTION, NOSE;  Surgeon: Keller Trinda Blanch, MD PHD;  Location: [facility] OR;  Service: Otolaryngology  . PR NASAL ENDOSCOPY DIAGNOSTIC UNI/BI SPX Left 10/28/2013   Procedure: ENDOSCOPY, NASAL;  Surgeon: Keller Trinda Blanch, MD PHD;  Location: [facility];  Service: Otolaryngology  . Rhinoplasty, cheek implants Bilateral   . SINUS SURGERY    . WISDOM TOOTH EXTRACTION      SOCIAL HX: Social History   Tobacco Use  . Smoking status: Never    Passive exposure: Never  . Smokeless tobacco: Never  Substance Use Topics  . Alcohol use: Yes    Comment: Social   .  FAMILY HX: Family History  Problem Relation Age of Onset  . Celiac disease Mother   . Osteoporosis Mother   . Hypertension Mother   . Migraines Mother   . Cancer Father   . Heart disease Father   . Stroke Father   . Celiac disease Brother   . Gout Brother   . Celiac disease Brother   . Gout Brother     MEDS: Current Outpatient Medications  Medication Sig Dispense Refill  . Ajovy  Autoinjector 225 mg/1.5 mL subcutaneous auto-injector Inject 1.5 mL under the skin every 30 days.    . amLODIPine  (Norvasc ) 5 mg tablet Take 1 tablet by mouth daily.    SABRA ascorbic acid (vitamin C) 500 mg chewable tablet Chew 2 tablets.    . aspirin  81 mg delayed release enteric coated tablet Take 1 tablet by mouth daily.    . budesonide -formoterol  (Symbicort ) 160-4.5 mcg/actuation inhaler Inhale 2 puffs into the lungs.    . carvediloL  (Coreg ) 12.5 mg tablet Take 1 tablet by mouth 4 times daily.    . cholecalciferol  (vitamin D3) 25 mcg (  1,000 unit) tablet Take 1 tablet by mouth.    . clotrimazole -betamethasone (Lotrisone) 1-0.05 % cream APPLY TOPICALLY 2 TIMES DAILY AS NEEDED. 45 g 5  . cyclobenzaprine  (Flexeril ) 5 mg tablet Take 1 tablet by mouth at bedtime.    . EPINEPHrine  0.3 mg/0.3 mL IM auto-injector Inject 0.3 mL into the muscle as needed for anaphylaxis.    . etonogestreL -ethinyl estradioL  (Nuvaring) 0.12-0.015 mg/24 hr vaginal ring Insert 1 Ring vaginally every 28 days. Insert 1 ring vaginally; leave in place for 4 weeks then remove and immediately reinsert a new ring. 3 each 3  . famotidine  (Pepcid ) 20 mg tablet Take 1 tablet by mouth 2 times daily with breakfast and dinner.    SABRA FERREX 28 151-200-1-0.8 mg Tablet Take 1 tablet by mouth daily.  3  . fluoride (sodium) (Clinpro 5000) 1.1 % dental paste Place onto teeth daily.    . hydroquinone 4 % cream Apply topically daily. PLEASE SEE ATTACHED FOR DETAILED DIRECTIONS    . lisinopriL (Prinivil) 5 mg tablet Take 1 tablet by mouth daily.    .  naratriptan  (Amerge) 2.5 mg tablet Take 1 tablet by mouth as needed.    . ondansetron  (Zofran -ODT) 4 mg orally disintegrating tablet Place 1 tablet on tongue to dissolve every 8 hours as needed for nausea.    . PROAIR  HFA 90 mcg/actuation inhaler INHALE 2 PUFFS BY MOUTH EVERY 4 HOURS  11  . Repatha SureClick 140 mg/mL subcutaneous auto-injector Inject 1 auto-injector under the skin every 14 days.    . sodium chloride -aloe vera (Ayr Saline Gel) nasal spray Use 3 sprays in each nostril 2 times daily. 22 mL 11  . solifenacin (Vesicare) 5 mg tablet TAKE 1 TABLET BY MOUTH EVERY DAY 90 tablet 1  . SYNTHROID  50 mcg tablet TAKE 1 TABLET BY MOUTH EVERY DAY  3  . tretinoin (Retin-A) 0.05 % cream SMARTSIG:1 Sparingly Topical Daily    . triamcinolone acetonide (Kenalog) 0.1 % cream Apply topically 2 times daily.    . valACYclovir  (Valtrex ) 500 mg tablet Take 1 tablet by mouth every morning.    . WELLBUTRIN  XL 150 mg extended release 24 hr tablet TAKE 1 TABLET BY MOUTH DAILY IN THE MORNING  1  . benzonatate (Tessalon Perles) 100 mg capsule Take 1 capsule by mouth 2 times daily as needed for cough. (Patient not taking: Reported on 10/04/2024)    . Calcium  Antacid 200 mg calcium  (500 mg) chewable tablet     . Tymlos  80 mcg (3,120 mcg/1.56 mL) subcutaneous pen injector Inject 80 mcg under the skin daily.     No current facility-administered medications for this visit.    ALLERGIES: Review of patient's allergies indicates:  Allergen Reactions  . Ciprofloxacin Hives and Other (See Comments)    Vascular  Per genetics, h/o disection  Vascular    Per genetics, h/o disection  . Other Hives    Dark alcohol  . Sulfa (sulfonamide antibiotics) Anaphylaxis and Other (See Comments)  . Camphor Other (See Comments)  . Gluten Nausea And Vomiting and Other (See Comments)    Celiac Disease  . Montelukast  Anxiety and Other (See Comments)    Angry, irritable, out of skin feeling  . Cigarette smoke Other (See  Comments)  . Mold Other (See Comments)  . Sulfamethazine Other (See Comments)     REVIEW OF SYSTEMS:  REVIEW OF SYSTEMS:  Constitutional:  No change in weight,  chills, night sweats or fatigue. +fever, +breathing problems Eyes: No pain, redness  or visual changes, no dry eyes  +glasses, +cataracts Ears: No change in hearing, pain, drainage or dizziness.  Nose:  No pain, purulence, or ulcers  Mouth and Throat:  No ulcers, swelling, sore throat, difficulty swallowing, hoarseness or toothache.   Cardiovascular: No chest pain, irregular heart beat, light headedness or dizziness.   Respiratory: No shortness of breath, cough, pleuritic pain, hemoptysis or dyspnea on exertion +asthma Gastrointestinal:  No diarrhea, constipation, abdominal pain, or dysphagia.  +nausea/vomiting Genitourinary: No urinary burning, incontinence or obstruction.  +urinary tract infections Musculoskeletal:  No joint pain or swelling.  Skin:  No new rash or lesions.   Neurological: No weakness, numbness, or seizures.   Psychiatric: No mood changes, visual or auditory hallucinations.   Endocrine: No heat or cold intolerance, no polyuria or polydipsia.   Hematologic/Lymphatic: No swollen lymph nodes or unexplained bruising. +anemia  Allergic/Immunologic:  No rhinorrhea, itchy watery eyes.  I have reviewed with the patient that the Review of Systems completed at the new patient visit is up to date.   PHYSICAL EXAM: BP 112/76   Pulse 67   Ht 170.2 cm (5' 7)   Wt 72.6 kg (160 lb)   SpO2 99%   BMI 25.06 kg/m  General:  Well-developed, well-nourished adult in no acute distress. Voice: Grossly normal. Head and Facial: Atraumatic, nontender to palpation.  No obvious mass. Neurological:  Normal, symmetric facial motion.  Tongue protrusion and palatal lift are symmetric and midline. Eyes:  Pupils equal round and reactive.  Extraocular movements normal. Ears:  Normal tympanic membranes, no fluid or retraction.   Nose:  Dorsum midline.  No mass or lesion. Bilateral positive cottle maneuver  Minimal dynamic nasal wall collapse but nasal flaring noted  Open roof deformity of the nasal dorsum Well healed scar on nasal tip Nasal skin is thin  Intranasal:  Normal inferior turbinates, septum deviated to the left  Very narrow passageways Sinuses: Left maxillary sinus tenderness  Oral cavity: No masses or lesions.  Mucous membranes moist and pink. Oropharynx:   Normal position of base of tongue.  Posterior pharyngeal mucosa normal.  No palatal or tonsillar lesions.  Normal uvula. Able to palpate the implants. The R feels lower than lef and has a ridge near the retromolare trigone. This area is tender but no signs of inflammation of infection.   Neck:   Nontender, no masses or lymphadenopathy.  Trachea is midline. Thyroid :  Normal to palpation. Respiratory: no retractions, normal work of breathing. Cardiovascular: no cyanosis, no peripheral edema.  RADIOLOGY- I reviewed the Cone beam scan that she brought from outside. THE implants are visible and the R one appears to have fractured off the maxilla and displaced inferiorly.    ASSESSMENT: 1. Synechiae in left nasal passageway - S/p Lysis - resolved 2. Nasal Valve Obstruction 3. Dislocation of R cheek implant placed 30 years ago.    PLAN Assessment & Plan 1. Jaw pain. Jaw pain is likely due to the displacement of the dental implant, exerting pressure on the masseter muscle. The tear ducts seem slightly clogged on the right side but appear fine on other views. The implants are well-affixed to the bone at the top, but as they extend downward, they begin to slightly detach from the bone, which is normal. The right implant appears to hang slightly lower. Bone remodeling is observed, typical for individuals with long-term implants. The right implant appears lower, and there is a possibility that something may have detached, causing it to crack and  displace, taking  some bone with it. This is not observed on the other side. The implants were sutured into place, creating a tight pocket, but the displacement has caused the pocket to loosen.   Advised to wear a compression dressing for 6 to 8 weeks to encourage the implant to reattach to the bone. Use gauze to support the implant and prevent it from dropping further. If the implant becomes infected or causes significant discomfort, it will need to be removed. If removed then consider fat grafting only on that side or removal or opposite side and no recon on either side.  2. Highly suspicious for connective tissue disorder Superior mesenteric artery dissection. History of superior mesenteric artery dissection diagnosed on 12/01/2023. Currently on conservative management and takes three blood pressure medications. Epigastric and inguinal hernias. Epigastric hernia and a hernia to the right of the previous inguinal hernia repair. No recent repairs have been done for the umbilical or epigastric hernias.  Follow-up Follow up in 2 months. Patient expressed understanding of the plan. Patient had all questions answered at this time.   Krishna G. Tobie, M.D., Ph. D. Professor Director, Facial Plastic and Reconstructive Surgeon Department of Otolaryngology-Head and Neck Surgery [facility] 

## 2024-10-01 ENCOUNTER — Encounter: Payer: Self-pay | Admitting: Rheumatology

## 2024-10-02 ENCOUNTER — Other Ambulatory Visit (HOSPITAL_COMMUNITY): Payer: Self-pay

## 2024-10-02 DIAGNOSIS — I671 Cerebral aneurysm, nonruptured: Secondary | ICD-10-CM | POA: Diagnosis not present

## 2024-10-03 DIAGNOSIS — R519 Headache, unspecified: Secondary | ICD-10-CM | POA: Diagnosis not present

## 2024-10-04 ENCOUNTER — Other Ambulatory Visit: Payer: Self-pay

## 2024-10-04 DIAGNOSIS — J3489 Other specified disorders of nose and nasal sinuses: Secondary | ICD-10-CM | POA: Diagnosis not present

## 2024-10-04 DIAGNOSIS — Z9889 Other specified postprocedural states: Secondary | ICD-10-CM | POA: Diagnosis not present

## 2024-10-04 NOTE — Progress Notes (Signed)
 Specialty Pharmacy Refill Coordination Note  Barbara Odonnell is a 48 y.o. female contacted today regarding refills of specialty medication(s) Abaloparatide  (Tymlos )   Patient requested Delivery   Delivery date: 10/16/24   Verified address: [address]   [place] [id number]   Medication will be filled on: 10/15/24

## 2024-10-07 ENCOUNTER — Inpatient Hospital Stay
Admission: RE | Admit: 2024-10-07 | Discharge: 2024-10-07 | Disposition: A | Payer: Self-pay | Source: Ambulatory Visit | Attending: Neurosurgery | Admitting: Neurosurgery

## 2024-10-07 ENCOUNTER — Other Ambulatory Visit: Payer: Self-pay

## 2024-10-07 DIAGNOSIS — Z049 Encounter for examination and observation for unspecified reason: Secondary | ICD-10-CM

## 2024-10-07 NOTE — Progress Notes (Signed)
 Clinical Intervention Note  Clinical Intervention Notes: Patient reported starting gabapentin recently as well as missing 10 or more doses of Tymlos . No DDIs identified with Tymlos  and gabapentin. Medication was held by patient due to concerns with side effects as well as concerns with medication contibuting to displacement of cheek implant. Per chart, Dr. Dolphus advised patient that the side effects will subside the longer that she is on Tymlos  and that it is unlikely the Tymlos  caused the implant displacement. Patient resumed therapy.   Clinical Intervention Outcomes: Improved therapy adherence; Prevention of an adverse drug event   Advertising Account Planner

## 2024-10-08 ENCOUNTER — Ambulatory Visit: Admitting: Neurosurgery

## 2024-10-08 ENCOUNTER — Ambulatory Visit: Admitting: Sports Medicine

## 2024-10-08 DIAGNOSIS — E7800 Pure hypercholesterolemia, unspecified: Secondary | ICD-10-CM | POA: Diagnosis not present

## 2024-10-08 DIAGNOSIS — I6523 Occlusion and stenosis of bilateral carotid arteries: Secondary | ICD-10-CM | POA: Diagnosis not present

## 2024-10-09 ENCOUNTER — Ambulatory Visit: Admitting: Vascular Surgery

## 2024-10-09 ENCOUNTER — Encounter: Payer: Self-pay | Admitting: Vascular Surgery

## 2024-10-09 ENCOUNTER — Ambulatory Visit (HOSPITAL_COMMUNITY)
Admission: RE | Admit: 2024-10-09 | Discharge: 2024-10-09 | Disposition: A | Discharge status: Home / Self Care | Source: Ambulatory Visit | Attending: Vascular Surgery | Admitting: Vascular Surgery

## 2024-10-09 VITALS — BP 114/81 | HR 74 | Temp 98.1°F | Ht 67.0 in | Wt 159.0 lb

## 2024-10-09 DIAGNOSIS — I7779 Dissection of other artery: Secondary | ICD-10-CM | POA: Diagnosis present

## 2024-10-09 DIAGNOSIS — K551 Chronic vascular disorders of intestine: Secondary | ICD-10-CM | POA: Diagnosis present

## 2024-10-09 NOTE — Progress Notes (Addendum)
 Patient ID: Barbara Odonnell, female   DOB: [birth year], 48 y.o.   MRN: [id number]  Reason for Consult: Follow-up   Referred by Bulah Alm RAMAN, PA-C  Subjective:     HPI:  Barbara Odonnell is a 48 y.o. 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  She remains on 3 antihypertensive medications including Coreg .  There is concern for connective tissue disorder and she has undergone genetic testing and is followed at [facility].  There is no family history of connective tissue disorder.  She is now eating without limitation and is gaining some weight since our last visit continues to workout frequently without elevating her blood pressure.  She has no history of DVT or thromboembolic complications.  Past Medical History:  Diagnosis Date   Allergy     Anemia    IV iron in past, prior to 2018   Asthma    Celiac disease    +prior biopsy   GERD (gastroesophageal reflux disease)    H/O bone density study 12/21/2017   normal   Hip fracture (HCC) 2008   right, Dr. Harvey   History of stress test 2014   [facility]   Hyperlipidemia    Hypertension    Hypothyroidism    Migraine headache without aura    gets bad nausea.  does well on Naratriptan  and Relpax.  Failed Imitrex , Maxalt    Osteopenia    Stress fracture    severeral prior   Transfusion history    has had IV iron prior, Dr. Timmy prior management   Ulcer    previous ulcer with benign polyp removed   Family History  Problem Relation Age of Onset   Hypertension Mother    Osteopenia Mother    Raynaud syndrome Mother    Celiac disease Mother    Cancer Father        asbestos related lung cancer   Heart disease Father 46       Stent   Pulmonary embolism Father    Stroke Father    Celiac disease Brother    Gout Brother    Gout Brother    Celiac disease Brother    Stroke Maternal Grandmother    COPD Maternal Grandmother     Osteopenia Maternal Grandmother    Heart disease Maternal Grandfather    Stroke Maternal Grandfather    Heart disease Paternal Grandfather        died 42yo   Diabetes Neg Hx    Past Surgical History:  Procedure Laterality Date   COLONOSCOPY  2015   [facility], advised to repeat q5 years; x 4 prior   COLONOSCOPY N/A 04/18/2024   Procedure: COLONOSCOPY;  Surgeon: Avram Lupita BRAVO, MD;  Location: [facility];  Service: Gastroenterology;  Laterality: N/A;  Intolerant of Fentanyl  and Propofol    COSMETIC SURGERY     cheek implants and nasal surgery   ESOPHAGOGASTRODUODENOSCOPY     x5 prior as of 2016   ESOPHAGOGASTRODUODENOSCOPY N/A 04/18/2024   Procedure: EGD (ESOPHAGOGASTRODUODENOSCOPY);  Surgeon: Avram Lupita BRAVO, MD;  Location: [facility];  Service: Gastroenterology;  Laterality: N/A;  Intolerant of Fentanyl  and Propofol    INGUINAL HERNIA REPAIR     left   NASAL SEPTUM SURGERY     POLYPECTOMY     sigmoid colon   SINOSCOPY     WISDOM TOOTH EXTRACTION      Short Social History:  Social History  Tobacco Use   Smoking status: Never    Passive exposure: Past   Smokeless tobacco: Never  Substance Use Topics   Alcohol use: Yes    Alcohol/week: 1.0 standard drink of alcohol    Types: 1 Glasses of wine per week    Comment: rare    Allergies  Allergen Reactions   Sulfa Antibiotics Anaphylaxis   Ciprofloxacin Other (See Comments)   Fentanyl  Other (See Comments)    BP drop   Gluten Meal     Celiac Disease   Singulair  [Montelukast  Sodium]     Angry, irritable, out of skin feeling    Current Outpatient Medications  Medication Sig Dispense Refill   Abaloparatide  (TYMLOS ) 3120 MCG/1.56ML SOPN Inject 80 mcg into the skin at bedtime. TAKE AT BEDTIME. ROTATE INJECTION SITES 1.56 mL 5   Albuterol  Sulfate (PROAIR  HFA IN) Inhale 1 puff into the lungs daily as needed.     amLODipine  (NORVASC ) 10 MG tablet TAKE 1 TABLET BY MOUTH EVERY DAY (Patient taking differently: Take 5 mg by  mouth daily.) 90 tablet 1   amLODipine  (NORVASC ) 5 MG tablet Take 5 mg by mouth daily.     Ascorbic Acid (C-500) 500 MG CHEW Chew 1,000 mg by mouth.     ASPIRIN  LOW DOSE 81 MG tablet Take 81 mg by mouth daily. (Patient taking differently: Take 162 mg by mouth daily.)     budesonide -formoterol  (SYMBICORT ) 160-4.5 MCG/ACT inhaler Inhale 2 puffs into the lungs 2 (two) times daily. in the morning and at bedtime. 10.2 each 11   buPROPion  (WELLBUTRIN  XL) 150 MG 24 hr tablet TAKE 1 TABLET BY MOUTH EVERY DAY 90 tablet 1   carvedilol  (COREG ) 12.5 MG tablet TAKE 1 TABLET (12.5 MG TOTAL) BY MOUTH IN THE MORNING, AT NOON, AND AT BEDTIME. (Patient taking differently: Take 12.5 mg by mouth 4 (four) times daily.) 270 tablet 1   chlorhexidine  (PERIDEX ) 0.12 % solution SMARTSIG:0.5 Ounce(s) Twice Daily     clindamycin (CLEOCIN T) 1 % lotion Apply topically.     clindamycin (CLINDAGEL) 1 % gel Apply topically.     clotrimazole -betamethasone (LOTRISONE) cream Apply topically 2 (two) times daily as needed.     cyclobenzaprine  (FLEXERIL ) 5 MG tablet TAKE 1 TABLET BY MOUTH AS NEEDED. 30 tablet 0   diclofenac  (FLECTOR ) 1.3 % PTCH Place 1 patch onto the skin 2 (two) times daily. (Patient taking differently: Place 1 patch onto the skin as needed.) 60 patch 1   EPINEPHrine  0.3 mg/0.3 mL IJ SOAJ injection 1 Syringe.     etonogestrel -ethinyl estradiol  (NUVARING) 0.12-0.015 MG/24HR vaginal ring Place 1 Ring vaginally.     fluticasone  (FLONASE ) 50 MCG/ACT nasal spray 2 sprays.     Fremanezumab -vfrm (AJOVY ) 225 MG/1.5ML SOAJ Inject 225 mg into the skin every 30 (thirty) days. Please run copay card: BIN [id number] PCN [id number] GRP [id number] ID [id number] 1.5 mL 11   hyoscyamine  (LEVSIN  SL) 0.125 MG SL tablet Take 2 tablets (0.25 mg total) by mouth every 4 (four) hours as needed (bladder spasm). 30 tablet 0   Insulin  Pen Needle 31G X 5 MM MISC Use to inject Tymlos . DO NOT REUSE NEEDLES. ROTATE INJECTION SITES 100 each 2   lisinopril  (ZESTRIL) 5 MG tablet Take 5 mg by mouth daily.     naratriptan  (AMERGE) 2.5 MG tablet Take 1 tablet (2.5 mg total) by mouth as needed for migraine. Take one (1) tablet at onset of headache; if returns or does not resolve, may repeat after 4  hours; do not exceed five (5) mg in 24 hours. 10 tablet 6   omeprazole  (PRILOSEC) 40 MG capsule TAKE 1 CAPSULE (40 MG TOTAL) BY MOUTH DAILY. (Patient taking differently: Take 40 mg by mouth as needed.) 30 capsule 1   ondansetron  (ZOFRAN -ODT) 4 MG disintegrating tablet DISSOLVE 1 TABLET ON THE TONGUE EVERY 8 HOURS AS NEEDED FOR NAUSEA AND VOMITING 90 tablet 0   REPATHA SURECLICK 140 MG/ML SOAJ Inject into the skin every 14 (fourteen) days.     Rimegepant Sulfate (NURTEC) 75 MG TBDP Take 1 tablet (75 mg total) by mouth daily as needed. 8 tablet 6   SODIUM FLUORIDE 5000 PPM 1.1 % PSTE 2 (two) times daily.     SYNTHROID  50 MCG tablet TAKE 1 TABLET BY MOUTH EVERY DAY 90 tablet 1   tretinoin (RETIN-A) 0.05 % cream Apply topically.     triamcinolone cream (KENALOG) 0.1 % Apply 1 Application topically.     valACYclovir  (VALTREX ) 500 MG tablet TAKE 1 TABLET BY MOUTH EVERY DAY **MYLAN BRAND** (Patient taking differently: Take 500 mg by mouth daily as needed.  **MYLAN BRAND**) 90 tablet 0   VENTOLIN  HFA 108 (90 Base) MCG/ACT inhaler TAKE 2 PUFFS BY MOUTH EVERY 6 HOURS AS NEEDED FOR WHEEZE OR SHORTNESS OF BREATH 18 each 0   VITAMIN D  PO Take 2,000 Units by mouth.     No current facility-administered medications for this visit.    Review of Systems  Constitutional:  Constitutional negative. HENT: HENT negative.  Eyes: Eyes negative.  Respiratory: Respiratory negative.  Cardiovascular: Cardiovascular negative.  GI: Gastrointestinal negative.  Musculoskeletal: Musculoskeletal negative.  Skin: Skin negative.  Neurological: Neurological negative. Hematologic: Hematologic/lymphatic negative.  Psychiatric: Psychiatric negative.        Objective:  Objective    Vitals:   10/09/24 1101  BP: 114/81  Pulse: 74  Temp: 98.1 F (36.7 C)  SpO2: 96%  Weight: 159 lb (72.1 kg)  Height: 5' 7 (1.702 m)   Body mass index is 24.9 kg/m.  Physical Exam HENT:     Head: Normocephalic.     Nose: Nose normal.  Eyes:     Pupils: Pupils are equal, round, and reactive to light.  Cardiovascular:     Rate and Rhythm: Normal rate.  Pulmonary:     Effort: Pulmonary effort is normal.  Abdominal:     General: Abdomen is flat.  Musculoskeletal:        General: Normal range of motion.     Cervical back: Normal range of motion.     Right lower leg: No edema.     Left lower leg: No edema.  Skin:    General: Skin is warm and dry.     Capillary Refill: Capillary refill takes less than 2 seconds.  Neurological:     General: No focal deficit present.     Mental Status: She is alert.  Psychiatric:        Mood and Affect: Mood normal.     Data: Duplex Findings:  +--------------------+--------+--------+------+--------+  Mesenteric         PSV cm/sEDV cm/sPlaqueComments  +--------------------+--------+--------+------+--------+  Aorta Prox             68                           +--------------------+--------+--------+------+--------+  Aorta Mid              86                           +--------------------+--------+--------+------+--------+  Aorta Distal           81                           +--------------------+--------+--------+------+--------+  Celiac Artery Origin  203                           +--------------------+--------+--------+------+--------+  SMA Origin            206                           +--------------------+--------+--------+------+--------+  SMA Proximal          114                           +--------------------+--------+--------+------+--------+  SMA Mid               103                           +--------------------+--------+--------+------+--------+  SMA Distal             109                           +--------------------+--------+--------+------+--------+  CHA                  264                           +--------------------+--------+--------+------+--------+  Splenic              107                           +--------------------+--------+--------+------+--------+  IMA                   98                           +--------------------+--------+--------+------+--------+     Summary:  Mesenteric:  Normal Inferior Mesenteric artery and Splenic artery findings. 70 to 99%  stenosis in the celiac artery.  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 Known Superior mesenteric artery  dissection.         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  This thrombus is natural course of dissection and not mobile and not thromboembolic complication.  Velocities are much improved in the SMA today as are her symptoms and she remains on single agent antiplatelet regimen with aspirin .  We discussed the signs and symptoms of worsening dissection, occlusion of the SMA or aneurysmal rupture which is very low risk and she demonstrates good understanding.  Short of that I will follow her up in 6 months and we will obtain CTA to correlate with duplex at that time.  If she has symptoms before that we discussed the need for urgent medical attention and she demonstrates good understanding.     Penne Lonni [place] MD [facility]

## 2024-10-10 ENCOUNTER — Other Ambulatory Visit: Payer: Self-pay | Admitting: *Deleted

## 2024-10-10 ENCOUNTER — Other Ambulatory Visit: Payer: Self-pay | Admitting: Medical

## 2024-10-10 DIAGNOSIS — K551 Chronic vascular disorders of intestine: Secondary | ICD-10-CM

## 2024-10-10 DIAGNOSIS — I7779 Dissection of other artery: Secondary | ICD-10-CM

## 2024-10-11 ENCOUNTER — Encounter: Payer: Self-pay | Admitting: Neurosurgery

## 2024-10-11 ENCOUNTER — Ambulatory Visit: Admitting: Neurosurgery

## 2024-10-11 VITALS — BP 92/67 | HR 72 | Temp 97.6°F | Ht 67.0 in | Wt 160.6 lb

## 2024-10-11 DIAGNOSIS — I7771 Dissection of carotid artery: Secondary | ICD-10-CM

## 2024-10-11 DIAGNOSIS — I7789 Other specified disorders of arteries and arterioles: Secondary | ICD-10-CM | POA: Diagnosis not present

## 2024-10-11 DIAGNOSIS — I720 Aneurysm of carotid artery: Secondary | ICD-10-CM

## 2024-10-11 NOTE — Progress Notes (Signed)
 Assessment : Discussed the use of AI scribe software for clinical note transcription with the patient, who gave verbal consent to proceed.  History of Present Illness Barbara Odonnell is a 48 year old female who presents for evaluation of potential connective tissue disorder.  In January, she experienced abdominal pain and lower back flank pain, initially thought to be a pulled muscle. 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 She is under the care of Dr. Penne Angus for vascular issues.  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 Her mother tested negative for this variant, and her brothers have not been tested.  She has a history of migraines since age 48, which increased to 25 episodes per month, often exercise-induced. She uses Ajovy  and naratriptan  for management, and her migraines have decreased since starting a beta blocker and reducing exercise intensity.  She reports multiple connective tissue issues, including three hernias, a labrum tear in her hip, and a cheek implant displacement. Her first hernia was an inguinal hernia repaired in 2000, followed by epigastric and umbilical hernias around the time of the dissection.  In January, she had the flu and bronchitis, treated with prednisone , followed by severe coughing, which she suspects may have contributed to the dissection. A colonoscopy and endoscopy revealed ulceration, which has since resolved.  She works as a pharmacist, community, but her stamina has been affected by beta blocker use. She has reduced her athletic activities and monitors her blood pressure closely. She reports  migraines as her only head symptom. Reports fatigue related to beta blocker use.    Plan : I listened carefully to her story and I am very sorry that she has a genetic predisposition, or so is felt, for a connective tissue disorder.  She was extensively worked up after her SMA dissection at [facility] and there were multiple genes found that can be seen in conjunction with that what I understand from her, it is not 100% binding connection.  As a part of the workup she had an ultrasound of the neck only so that she could get an MRA of her head and neck.  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 Memory, neurosurgeon at [facility], who recommended observation.  Patient was referred to me by her neurologist here at [facility].  I went over all of her imaging and reviewed them with her.  I told her that the finding on her MRA of her head is the last thing that she needs to be worried about: I think this is an infundibulum and a congenital finding and I am not worried about that.  She had already gotten good understanding of the difference between an infundibulum and an aneurysm but for her peace of mind, we will get an MRA in 1 year.  More importantly, and this is not mentioned on the report of the MRA done at [facility], she has a dissection at the right craniocervical junction: She has a dissection flap at the point where the right cervical internal carotid artery enters the skull base.  I reviewed the thin slices as well as the reconstructions with  her and showed her this and she was able to take pictures of this.  More likely than not, her cervical dissection and the abdominal dissection happened around the same time and thinking back, she confirms that a few days prior to her abdominal event, she had suffered from the flu and had uncontrollable coughing.  This is a maneuver that can definitely predispose dissections.  I went over the  etiology of dissections and told her that presence of a dissection by itself does not confirm a connective tissue disorder but it can happen to anybody as long as there is a violent enough insult.  Severe coughing like she had prior can set her up for this, or anybody else for that matter.  I told her that I need her to try to avoid aggressive Valsalva maneuvers and gave her some tips with regard to coughing and sneezing in order to avoid this from happening again.  The dissection in her carotid artery is healed and she is currently on 2 baby aspirin 's a day and from my standpoint the treatment for this would be a baby aspirin  a day indefinitely.  If she ever needs to have surgery or need to stop this for a medical procedure, she is welcome to do so and resume it whenever it is deemed safe by her treating physician.  We talked about familial risks and given the fact that her geneticist at [facility] already recommends that her family get screened for connective tissue disorders, this may add weight to that argument.  I would like to see her back in 1 year with the aforementioned MRA of the head and I will include an MRA of the neck.  If anything changes prior to that, she can call me.   Social History   Socioeconomic History   Marital status: Single    Spouse name: Not on file   Number of children: Not on file   Years of education: Not on file   Highest education level: Doctorate  Occupational History   Not on file  Tobacco Use   Smoking status: Never    Passive exposure: Past   Smokeless tobacco: Never  Vaping Use   Vaping status: Never Used  Substance and Sexual Activity   Alcohol use: Yes    Alcohol/week: 1.0 standard drink of alcohol    Types: 1 Glasses of wine per week    Comment: rare   Drug use: No   Sexual activity: Not on file  Other Topics Concern   Not on file  Social History Narrative   Lives alone, 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   Prior worked as professor at [employer].  Exercise:- 6-7 days per week, teaches spin at [employer], swam competitively in college, marathon runner.       [place] [facility]:   Originally from [place]. Has also lived in [place], [place], & [place].  Currently working in counseling. No pets currently. No bird exposure. No mold or hot tub exposure. Currently has carpet in the bedroom. No draperies. No exposure to asbestos.      Social Drivers of Health   Tobacco Use: Low Risk (10/11/2024)   Patient History    Smoking Tobacco Use: Never    Smokeless Tobacco Use: Never    Passive Exposure: Past  Financial Resource Strain: Low Risk  (09/11/2024)   Received from [facility]    Overall Financial Resource Strain (CARDIA)  How hard is it for you to pay for the very basics like food, housing, medical care, and heating?: Not hard at all  Food Insecurity: No Food Insecurity (09/11/2024)   Received from [facility]    Epic    Within the past 12 months, you worried that your food would run out before you got the money to buy more.: Never true    Within the past 12 months, the food you bought just didn't last and you didn't have money to get more.: Never true  Recent Concern: Food Insecurity - Food Insecurity Present (07/31/2024)   Received from [facility]   Epic    Within the past 12 months, you worried that your food would run out before you got the money to buy more.: Sometimes true    Within the past 12 months, the food you bought just didn't last and you didn't have money to get more.: Never true  Transportation Needs: No Transportation Needs (09/11/2024)   Received from [facility]    Epic    In the past 12 months, has lack of transportation kept you from medical appointments or from getting medications?: No    In the past 12 months, has lack of transportation kept you from  meetings, work, or from getting things needed for daily living?: No  Physical Activity: Sufficiently Active (09/11/2024)   Received from [facility]    Exercise Vital Sign    On average, how many days per week do you engage in moderate to strenuous exercise (like a brisk walk)?: 7 days    On average, how many minutes do you engage in exercise at this level?: 40 min  Stress: No Stress Concern Present (09/11/2024)   Received from [facility]    [employer] of Occupational Health - Occupational Stress Questionnaire - Flowsheets    Do you feel stress - tense, restless, nervous, or anxious, or unable to sleep at night because your mind is troubled all the time - these days?: Not at all  Social Connections: Moderately Isolated (09/11/2024)   [facility]    Social Connection and Isolation Panel    In a typical week, how many times do you talk on the phone with family, friends, or neighbors?: More than three times a week    How often do you get together with friends or relatives?: More than three times a week    How often do you attend church or religious services?: More than 4 times per year    Do you belong to any clubs or organizations such as church groups, unions, fraternal or athletic groups, or school groups?: No    How often do you attend meetings of the clubs or organizations you belong to?: Never    Are you married, widowed, divorced, separated, never married, or living with a partner?: Never married  Intimate Partner Violence: Not At Risk (09/11/2024)   Received from [facility]    Epic    Within the last year, have you been afraid of your partner or ex-partner?: No    Within the last year, have you been humiliated or emotionally abused in other ways by your partner or ex-partner?: No    Within the last year, have you been kicked, hit, slapped, or otherwise physically hurt by  your partner or ex-partner?: No    Within the last year, have you been raped or forced to have  any kind of sexual activity by your partner or ex-partner?: No  Depression (PHQ2-9): Low Risk (11/06/2023)   Depression (PHQ2-9)    PHQ-2 Score: 0  Alcohol Screen: Low Risk (11/06/2023)   Alcohol Screen    Last Alcohol Screening Score (AUDIT): 1  Housing: Unknown (09/11/2024)   Received from [facility]    Epic    In the last 12 months, was there a time when you were not able to pay the mortgage or rent on time?: No    Number of Times Moved in the Last Year: Not on file    At any time in the past 12 months, were you homeless or living in a shelter (including now)?: No  Utilities: Not At Risk (09/11/2024)   Received from [facility]    Epic    In the past 12 months has the electric, gas, oil, or water company threatened to shut off services in your home?: No  Health Literacy: Adequate Health Literacy (09/11/2024)   Received from [facility]    B1300 Health Literacy    How often do you need to have someone help you when you read instructions, pamphlets, or other written material from your doctor or pharmacy?: Never    Family History  Problem Relation Age of Onset   Hypertension Mother    Osteopenia Mother    Raynaud syndrome Mother    Celiac disease Mother    Cancer Father        asbestos related lung cancer   Heart disease Father 42       Stent   Pulmonary embolism Father    Stroke Father    Celiac disease Brother    Gout Brother    Gout Brother    Celiac disease Brother    Stroke Maternal Grandmother    COPD Maternal Grandmother    Osteopenia Maternal Grandmother    Heart disease Maternal Grandfather    Stroke Maternal Grandfather    Heart disease Paternal Grandfather        died 75yo   Diabetes Neg Hx     Allergies[1]  Past Medical History:  Diagnosis Date   Allergy     Anemia    IV iron in past,  prior to 2018   Asthma    Celiac disease    +prior biopsy   GERD (gastroesophageal reflux disease)    H/O bone density study 12/21/2017   normal   Hip fracture (HCC) 2008   right, Dr. Harvey   History of stress test 2014   [facility]   Hyperlipidemia    Hypertension    Hypothyroidism    Migraine headache without aura    gets bad nausea.  does well on Naratriptan  and Relpax.  Failed Imitrex , Maxalt    Osteopenia    Stress fracture    severeral prior   Transfusion history    has had IV iron prior, Dr. Timmy prior management   Ulcer    previous ulcer with benign polyp removed    Past Surgical History:  Procedure Laterality Date   COLONOSCOPY  2015   [facility], advised to repeat q5 years; x 4 prior   COLONOSCOPY N/A 04/18/2024   Procedure: COLONOSCOPY;  Surgeon: Avram Lupita BRAVO, MD;  Location: [facility];  Service: Gastroenterology;  Laterality: N/A;  Intolerant of Fentanyl  and Propofol    COSMETIC SURGERY     cheek implants and nasal surgery   ESOPHAGOGASTRODUODENOSCOPY     x5 prior as  of 2016   ESOPHAGOGASTRODUODENOSCOPY N/A 04/18/2024   Procedure: EGD (ESOPHAGOGASTRODUODENOSCOPY);  Surgeon: Avram Lupita BRAVO, MD;  Location: [facility];  Service: Gastroenterology;  Laterality: N/A;  Intolerant of Fentanyl  and Propofol    INGUINAL HERNIA REPAIR     left   NASAL SEPTUM SURGERY     POLYPECTOMY     sigmoid colon   SINOSCOPY     WISDOM TOOTH EXTRACTION       Physical Exam   Physical Exam HENT:     Head: Normocephalic.     Nose: Nose normal.  Eyes:     Pupils: Pupils are equal, round, and reactive to light.  Cardiovascular:     Rate and Rhythm: Normal rate.  Pulmonary:     Effort: Pulmonary effort is normal.  Abdominal:     General: Abdomen is flat.  Musculoskeletal:     Cervical back: Normal range of motion.  Neurological:     Mental Status: Patient is alert.     Cranial Nerves: Cranial nerves 2-12 are intact.     Sensory: Sensation is intact.      Motor: Motor function is intact.     Coordination: Coordination is intact.     Results for orders placed or performed during the hospital encounter of 12/06/23  CT Head Wo Contrast   Narrative   CLINICAL DATA:  Trauma  EXAM: CT HEAD WITHOUT CONTRAST  TECHNIQUE: Contiguous axial images were obtained from the base of the skull through the vertex without intravenous contrast.  RADIATION DOSE REDUCTION: This exam was performed according to the departmental dose-optimization program which includes automated exposure control, adjustment of the mA and/or kV according to patient size and/or use of iterative reconstruction technique.  COMPARISON:  None Available.  FINDINGS: Brain: No evidence of acute infarction, hemorrhage, hydrocephalus, extra-axial collection or mass lesion/mass effect.  Vascular: No hyperdense vessel or unexpected calcification.  Skull: Normal. Negative for fracture or focal lesion.  Sinuses/Orbits: No acute finding.  Other: None.  IMPRESSION: No acute intracranial abnormality.   Electronically Signed   By: Greig Pique M.D.   On: 12/06/2023 22:32        [1]  Allergies Allergen Reactions   Sulfa Antibiotics Anaphylaxis   Ciprofloxacin Other (See Comments)   Fentanyl  Other (See Comments)    BP drop   Gluten Meal     Celiac Disease   Singulair  [Montelukast  Sodium]     Angry, irritable, out of skin feeling

## 2024-10-11 NOTE — Addendum Note (Signed)
 Addended by: ZELPHIA AVELINA CROME on: 10/11/2024 09:21 AM   Modules accepted: Orders

## 2024-10-12 ENCOUNTER — Encounter: Payer: Self-pay | Admitting: Vascular Surgery

## 2024-10-14 DIAGNOSIS — B1910 Unspecified viral hepatitis B without hepatic coma: Secondary | ICD-10-CM | POA: Diagnosis not present

## 2024-10-14 DIAGNOSIS — L309 Dermatitis, unspecified: Secondary | ICD-10-CM | POA: Diagnosis not present

## 2024-10-14 DIAGNOSIS — X58XXXD Exposure to other specified factors, subsequent encounter: Secondary | ICD-10-CM | POA: Diagnosis not present

## 2024-10-14 DIAGNOSIS — Z7951 Long term (current) use of inhaled steroids: Secondary | ICD-10-CM | POA: Diagnosis not present

## 2024-10-14 DIAGNOSIS — Z79899 Other long term (current) drug therapy: Secondary | ICD-10-CM | POA: Diagnosis not present

## 2024-10-14 DIAGNOSIS — J4530 Mild persistent asthma, uncomplicated: Secondary | ICD-10-CM | POA: Diagnosis not present

## 2024-10-14 DIAGNOSIS — L299 Pruritus, unspecified: Secondary | ICD-10-CM | POA: Diagnosis not present

## 2024-10-14 DIAGNOSIS — T7819XD Other adverse food reactions, not elsewhere classified, subsequent encounter: Secondary | ICD-10-CM | POA: Diagnosis not present

## 2024-10-14 DIAGNOSIS — L858 Other specified epidermal thickening: Secondary | ICD-10-CM | POA: Diagnosis not present

## 2024-10-14 DIAGNOSIS — J309 Allergic rhinitis, unspecified: Secondary | ICD-10-CM | POA: Diagnosis not present

## 2024-10-15 ENCOUNTER — Other Ambulatory Visit: Payer: Self-pay

## 2024-10-15 ENCOUNTER — Other Ambulatory Visit: Payer: Self-pay | Admitting: Medical

## 2024-10-15 NOTE — Telephone Encounter (Signed)
Pt has an appt in February

## 2024-11-01 ENCOUNTER — Other Ambulatory Visit: Payer: Self-pay | Admitting: Medical

## 2024-11-01 NOTE — Telephone Encounter (Signed)
 Sending to you since Ludie is out of the office.

## 2024-11-05 ENCOUNTER — Ambulatory Visit: Admitting: Sports Medicine

## 2024-11-07 ENCOUNTER — Other Ambulatory Visit: Payer: Self-pay

## 2024-11-11 ENCOUNTER — Other Ambulatory Visit (HOSPITAL_COMMUNITY): Payer: Self-pay

## 2024-11-11 ENCOUNTER — Telehealth: Payer: Self-pay

## 2024-11-11 ENCOUNTER — Other Ambulatory Visit: Payer: Self-pay

## 2024-11-11 NOTE — Telephone Encounter (Signed)
 Tymlos  copay card: BIN: [id number] PCN: [id number] Group: [id number] ID: [id number]

## 2024-11-11 NOTE — Progress Notes (Addendum)
 Tymlos  copay card: BIN: [id number] PCN: [id number] Group: [id number] ID: [id number]  Entered into [facility] - please try to reprocess nad notify us  if any issues. Thanks  Sherry Pennant, PharmD, MPH, BCPS, CPP Clinical Pharmacist

## 2024-11-12 ENCOUNTER — Other Ambulatory Visit: Payer: Self-pay | Admitting: Medical

## 2024-11-13 ENCOUNTER — Other Ambulatory Visit (HOSPITAL_COMMUNITY): Payer: Self-pay

## 2024-11-15 ENCOUNTER — Other Ambulatory Visit: Payer: Self-pay

## 2024-11-18 ENCOUNTER — Other Ambulatory Visit: Payer: Self-pay

## 2024-11-20 ENCOUNTER — Ambulatory Visit: Admitting: Family Medicine

## 2024-11-27 ENCOUNTER — Ambulatory Visit: Admitting: Family Medicine

## 2024-12-02 ENCOUNTER — Encounter: Payer: BC Managed Care – PPO | Admitting: Medical

## 2024-12-03 ENCOUNTER — Other Ambulatory Visit: Payer: Self-pay

## 2024-12-03 ENCOUNTER — Other Ambulatory Visit (HOSPITAL_COMMUNITY): Payer: Self-pay

## 2024-12-11 ENCOUNTER — Encounter: Payer: Self-pay | Admitting: Medical

## 2025-02-05 ENCOUNTER — Ambulatory Visit: Admitting: Rheumatology

## 2025-03-17 ENCOUNTER — Ambulatory Visit: Admitting: Diagnostic Neuroimaging

## 2025-04-09 ENCOUNTER — Ambulatory Visit (HOSPITAL_COMMUNITY)

## 2025-04-09 ENCOUNTER — Ambulatory Visit: Admitting: Vascular Surgery

## 2025-10-01 ENCOUNTER — Other Ambulatory Visit

## 2025-10-03 ENCOUNTER — Ambulatory Visit: Admitting: Neurosurgery

## 2025-10-10 ENCOUNTER — Ambulatory Visit: Admitting: Neurosurgery
